# Patient Record
Sex: Male | Born: 1940 | Race: White | Hispanic: No | Marital: Married | Smoking: Current every day smoker
Health system: Southern US, Community
[De-identification: ages and names within clinical notes are randomized; demographics above are authoritative.]

## PROBLEM LIST (undated history)

## (undated) DIAGNOSIS — Z720 Tobacco use: Secondary | ICD-10-CM

## (undated) DIAGNOSIS — I4891 Unspecified atrial fibrillation: Secondary | ICD-10-CM

## (undated) DIAGNOSIS — I10 Essential (primary) hypertension: Secondary | ICD-10-CM

## (undated) DIAGNOSIS — K589 Irritable bowel syndrome without diarrhea: Secondary | ICD-10-CM

## (undated) DIAGNOSIS — J449 Chronic obstructive pulmonary disease, unspecified: Secondary | ICD-10-CM

## (undated) DIAGNOSIS — K8020 Calculus of gallbladder without cholecystitis without obstruction: Secondary | ICD-10-CM

## (undated) DIAGNOSIS — E785 Hyperlipidemia, unspecified: Secondary | ICD-10-CM

## (undated) DIAGNOSIS — G629 Polyneuropathy, unspecified: Secondary | ICD-10-CM

## (undated) DIAGNOSIS — I739 Peripheral vascular disease, unspecified: Secondary | ICD-10-CM

## (undated) HISTORY — DX: Hyperlipidemia, unspecified: E78.5

## (undated) HISTORY — PX: BACK SURGERY: SHX140

## (undated) HISTORY — DX: Essential (primary) hypertension: I10

## (undated) HISTORY — DX: Irritable bowel syndrome, unspecified: K58.9

## (undated) HISTORY — DX: Peripheral vascular disease, unspecified: I73.9

## (undated) HISTORY — DX: Tobacco use: Z72.0

---

## 1999-07-16 ENCOUNTER — Other Ambulatory Visit: Admission: RE | Admit: 1999-07-16 | Discharge: 1999-07-16 | Payer: Self-pay | Admitting: Otolaryngology

## 2000-01-03 ENCOUNTER — Ambulatory Visit (HOSPITAL_BASED_OUTPATIENT_CLINIC_OR_DEPARTMENT_OTHER): Admission: RE | Admit: 2000-01-03 | Discharge: 2000-01-03 | Payer: Self-pay | Admitting: Otolaryngology

## 2001-02-02 ENCOUNTER — Ambulatory Visit (HOSPITAL_COMMUNITY): Admission: RE | Admit: 2001-02-02 | Discharge: 2001-02-02 | Payer: Self-pay | Admitting: Pulmonary Disease

## 2001-09-21 ENCOUNTER — Inpatient Hospital Stay (HOSPITAL_COMMUNITY): Admission: EM | Admit: 2001-09-21 | Discharge: 2001-09-25 | Payer: Self-pay | Admitting: Emergency Medicine

## 2001-09-22 ENCOUNTER — Encounter: Payer: Self-pay | Admitting: Cardiovascular Disease

## 2001-09-28 ENCOUNTER — Ambulatory Visit (HOSPITAL_COMMUNITY): Admission: RE | Admit: 2001-09-28 | Discharge: 2001-09-28 | Payer: Self-pay | Admitting: Cardiovascular Disease

## 2001-09-28 ENCOUNTER — Encounter: Payer: Self-pay | Admitting: Cardiovascular Disease

## 2002-04-09 ENCOUNTER — Encounter: Payer: Self-pay | Admitting: Cardiology

## 2002-04-09 ENCOUNTER — Inpatient Hospital Stay (HOSPITAL_COMMUNITY): Admission: AD | Admit: 2002-04-09 | Discharge: 2002-04-14 | Payer: Self-pay | Admitting: Cardiology

## 2002-05-05 ENCOUNTER — Encounter: Payer: Self-pay | Admitting: Neurology

## 2002-05-05 ENCOUNTER — Ambulatory Visit (HOSPITAL_COMMUNITY): Admission: RE | Admit: 2002-05-05 | Discharge: 2002-05-05 | Payer: Self-pay | Admitting: Neurology

## 2002-05-09 ENCOUNTER — Inpatient Hospital Stay (HOSPITAL_COMMUNITY): Admission: EM | Admit: 2002-05-09 | Discharge: 2002-06-02 | Payer: Self-pay

## 2002-05-10 ENCOUNTER — Encounter: Payer: Self-pay | Admitting: Internal Medicine

## 2002-05-17 ENCOUNTER — Encounter (HOSPITAL_BASED_OUTPATIENT_CLINIC_OR_DEPARTMENT_OTHER): Payer: Self-pay | Admitting: Internal Medicine

## 2002-05-19 ENCOUNTER — Encounter: Payer: Self-pay | Admitting: Cardiovascular Disease

## 2002-05-22 ENCOUNTER — Encounter: Payer: Self-pay | Admitting: Internal Medicine

## 2002-05-26 ENCOUNTER — Encounter (HOSPITAL_BASED_OUTPATIENT_CLINIC_OR_DEPARTMENT_OTHER): Payer: Self-pay | Admitting: Internal Medicine

## 2002-06-02 ENCOUNTER — Inpatient Hospital Stay (HOSPITAL_COMMUNITY)
Admission: RE | Admit: 2002-06-02 | Discharge: 2002-07-08 | Payer: Self-pay | Admitting: Physical Medicine & Rehabilitation

## 2002-06-21 ENCOUNTER — Encounter: Payer: Self-pay | Admitting: Neurology

## 2002-06-30 ENCOUNTER — Encounter: Payer: Self-pay | Admitting: Physical Medicine & Rehabilitation

## 2002-07-06 ENCOUNTER — Encounter: Payer: Self-pay | Admitting: Orthopaedic Surgery

## 2002-12-21 ENCOUNTER — Encounter (HOSPITAL_COMMUNITY): Admission: RE | Admit: 2002-12-21 | Discharge: 2003-01-20 | Payer: Self-pay | Admitting: Neurology

## 2003-01-20 ENCOUNTER — Encounter (HOSPITAL_COMMUNITY): Admission: RE | Admit: 2003-01-20 | Discharge: 2003-02-19 | Payer: Self-pay | Admitting: Neurology

## 2004-04-19 ENCOUNTER — Encounter: Admission: RE | Admit: 2004-04-19 | Discharge: 2004-04-19 | Payer: Self-pay

## 2004-12-19 ENCOUNTER — Ambulatory Visit (HOSPITAL_COMMUNITY): Admission: RE | Admit: 2004-12-19 | Discharge: 2004-12-19 | Payer: Self-pay | Admitting: Pulmonary Disease

## 2005-01-15 ENCOUNTER — Encounter (HOSPITAL_COMMUNITY): Admission: RE | Admit: 2005-01-15 | Discharge: 2005-02-14 | Payer: Self-pay | Admitting: Neurology

## 2005-01-23 ENCOUNTER — Ambulatory Visit (HOSPITAL_COMMUNITY): Admission: RE | Admit: 2005-01-23 | Discharge: 2005-01-23 | Payer: Self-pay | Admitting: Pulmonary Disease

## 2005-02-05 ENCOUNTER — Ambulatory Visit (HOSPITAL_COMMUNITY): Admission: RE | Admit: 2005-02-05 | Discharge: 2005-02-05 | Payer: Self-pay | Admitting: Pulmonary Disease

## 2005-02-15 ENCOUNTER — Encounter (HOSPITAL_COMMUNITY): Admission: RE | Admit: 2005-02-15 | Discharge: 2005-03-17 | Payer: Self-pay | Admitting: Neurology

## 2005-03-04 ENCOUNTER — Ambulatory Visit (HOSPITAL_COMMUNITY): Admission: RE | Admit: 2005-03-04 | Discharge: 2005-03-04 | Payer: Self-pay | Admitting: Pulmonary Disease

## 2005-03-14 ENCOUNTER — Ambulatory Visit (HOSPITAL_COMMUNITY): Admission: RE | Admit: 2005-03-14 | Discharge: 2005-03-14 | Payer: Self-pay | Admitting: Orthopaedic Surgery

## 2005-05-06 ENCOUNTER — Ambulatory Visit (HOSPITAL_COMMUNITY): Admission: RE | Admit: 2005-05-06 | Discharge: 2005-05-06 | Payer: Self-pay | Admitting: Pulmonary Disease

## 2005-11-05 ENCOUNTER — Ambulatory Visit (HOSPITAL_COMMUNITY): Admission: RE | Admit: 2005-11-05 | Discharge: 2005-11-05 | Payer: Self-pay | Admitting: Pulmonary Disease

## 2006-03-17 ENCOUNTER — Encounter (HOSPITAL_BASED_OUTPATIENT_CLINIC_OR_DEPARTMENT_OTHER): Admission: RE | Admit: 2006-03-17 | Discharge: 2006-06-15 | Payer: Self-pay | Admitting: Surgery

## 2008-01-27 ENCOUNTER — Ambulatory Visit (HOSPITAL_COMMUNITY): Admission: RE | Admit: 2008-01-27 | Discharge: 2008-01-27 | Payer: Self-pay | Admitting: Cardiovascular Disease

## 2009-07-12 ENCOUNTER — Inpatient Hospital Stay (HOSPITAL_COMMUNITY): Admission: AD | Admit: 2009-07-12 | Discharge: 2009-07-21 | Payer: Self-pay | Admitting: Cardiovascular Disease

## 2009-07-12 ENCOUNTER — Ambulatory Visit: Payer: Self-pay | Admitting: Critical Care Medicine

## 2009-07-18 ENCOUNTER — Encounter (INDEPENDENT_AMBULATORY_CARE_PROVIDER_SITE_OTHER): Payer: Self-pay | Admitting: Cardiovascular Disease

## 2009-07-18 ENCOUNTER — Ambulatory Visit: Payer: Self-pay | Admitting: Vascular Surgery

## 2009-08-01 ENCOUNTER — Encounter: Payer: Self-pay | Admitting: Pulmonary Disease

## 2009-08-09 DIAGNOSIS — I739 Peripheral vascular disease, unspecified: Secondary | ICD-10-CM | POA: Insufficient documentation

## 2009-08-09 DIAGNOSIS — N259 Disorder resulting from impaired renal tubular function, unspecified: Secondary | ICD-10-CM | POA: Insufficient documentation

## 2009-08-09 DIAGNOSIS — E119 Type 2 diabetes mellitus without complications: Secondary | ICD-10-CM | POA: Insufficient documentation

## 2009-08-09 DIAGNOSIS — I2510 Atherosclerotic heart disease of native coronary artery without angina pectoris: Secondary | ICD-10-CM | POA: Insufficient documentation

## 2009-08-09 DIAGNOSIS — J4489 Other specified chronic obstructive pulmonary disease: Secondary | ICD-10-CM | POA: Insufficient documentation

## 2009-08-09 DIAGNOSIS — J449 Chronic obstructive pulmonary disease, unspecified: Secondary | ICD-10-CM | POA: Insufficient documentation

## 2009-08-09 DIAGNOSIS — I4891 Unspecified atrial fibrillation: Secondary | ICD-10-CM | POA: Insufficient documentation

## 2009-08-11 ENCOUNTER — Ambulatory Visit: Payer: Self-pay | Admitting: Pulmonary Disease

## 2009-08-11 DIAGNOSIS — J984 Other disorders of lung: Secondary | ICD-10-CM

## 2010-03-01 ENCOUNTER — Ambulatory Visit (HOSPITAL_COMMUNITY): Admission: RE | Admit: 2010-03-01 | Discharge: 2010-03-01 | Payer: Self-pay | Admitting: Pulmonary Disease

## 2010-05-12 ENCOUNTER — Other Ambulatory Visit: Payer: Self-pay | Admitting: Emergency Medicine

## 2010-05-12 ENCOUNTER — Other Ambulatory Visit: Payer: Self-pay

## 2010-05-12 ENCOUNTER — Inpatient Hospital Stay (HOSPITAL_COMMUNITY): Admission: EM | Admit: 2010-05-12 | Discharge: 2010-05-14 | Payer: Self-pay | Admitting: Internal Medicine

## 2010-05-13 ENCOUNTER — Ambulatory Visit: Payer: Self-pay | Admitting: Gastroenterology

## 2010-10-07 ENCOUNTER — Encounter: Payer: Self-pay | Admitting: Cardiovascular Disease

## 2010-11-30 LAB — DIFFERENTIAL
Basophils Absolute: 0.1 10*3/uL (ref 0.0–0.1)
Basophils Relative: 1 % (ref 0–1)
Eosinophils Absolute: 0.3 10*3/uL (ref 0.0–0.7)
Eosinophils Absolute: 0.3 10*3/uL (ref 0.0–0.7)
Lymphs Abs: 3.4 10*3/uL (ref 0.7–4.0)
Monocytes Absolute: 1.1 10*3/uL — ABNORMAL HIGH (ref 0.1–1.0)
Monocytes Relative: 9 % (ref 3–12)
Neutrophils Relative %: 63 % (ref 43–77)

## 2010-11-30 LAB — GLUCOSE, CAPILLARY
Glucose-Capillary: 106 mg/dL — ABNORMAL HIGH (ref 70–99)
Glucose-Capillary: 124 mg/dL — ABNORMAL HIGH (ref 70–99)
Glucose-Capillary: 200 mg/dL — ABNORMAL HIGH (ref 70–99)
Glucose-Capillary: 80 mg/dL (ref 70–99)
Glucose-Capillary: 94 mg/dL (ref 70–99)

## 2010-11-30 LAB — TYPE AND SCREEN
ABO/RH(D): A POS
Antibody Screen: NEGATIVE
Antibody Screen: NEGATIVE

## 2010-11-30 LAB — COMPREHENSIVE METABOLIC PANEL
ALT: 21 U/L (ref 0–53)
ALT: 23 U/L (ref 0–53)
CO2: 33 mEq/L — ABNORMAL HIGH (ref 19–32)
CO2: 35 mEq/L — ABNORMAL HIGH (ref 19–32)
Calcium: 9.3 mg/dL (ref 8.4–10.5)
Creatinine, Ser: 1.1 mg/dL (ref 0.4–1.5)
Creatinine, Ser: 1.12 mg/dL (ref 0.4–1.5)
GFR calc non Af Amer: >60 mL/min (ref >60)
GFR calc non Af Amer: >60 mL/min (ref >60)
Glucose, Bld: 90 mg/dL (ref 70–99)
Glucose, Bld: 97 mg/dL (ref 70–99)
Potassium: 4.4 mEq/L (ref 3.5–5.1)
Sodium: 138 mEq/L (ref 135–145)
Total Bilirubin: 0.4 mg/dL (ref 0.3–1.2)
Total Protein: 5.3 g/dL — ABNORMAL LOW (ref 6.0–8.3)

## 2010-11-30 LAB — HEMOGLOBIN AND HEMATOCRIT, BLOOD
HCT: 36 % — ABNORMAL LOW (ref 39.0–52.0)
HCT: 36.1 % — ABNORMAL LOW (ref 39.0–52.0)
Hemoglobin: 11.6 g/dL — ABNORMAL LOW (ref 13.0–17.0)

## 2010-11-30 LAB — CBC
HCT: 33.8 % — ABNORMAL LOW (ref 39.0–52.0)
HCT: 36.2 % — ABNORMAL LOW (ref 39.0–52.0)
HCT: 43.4 % (ref 39.0–52.0)
Hemoglobin: 12 g/dL — ABNORMAL LOW (ref 13.0–17.0)
Hemoglobin: 14.5 g/dL (ref 13.0–17.0)
MCH: 28.7 pg (ref 26.0–34.0)
MCH: 30.1 pg (ref 26.0–34.0)
MCH: 30.2 pg (ref 26.0–34.0)
MCH: 30.3 pg (ref 26.0–34.0)
MCHC: 33.1 g/dL (ref 30.0–36.0)
MCHC: 33.4 g/dL (ref 30.0–36.0)
MCHC: 33.7 g/dL (ref 30.0–36.0)
MCHC: 33.8 g/dL (ref 30.0–36.0)
MCV: 89.2 fL (ref 78.0–100.0)
MCV: 90.2 fL (ref 78.0–100.0)
Platelets: 193 10*3/uL (ref 150–400)
Platelets: 215 10*3/uL (ref 150–400)
RBC: 4.1 MIL/uL — ABNORMAL LOW (ref 4.22–5.81)
RBC: 4.27 MIL/uL (ref 4.22–5.81)
RDW: 14 % (ref 11.5–15.5)
WBC: 10.2 10*3/uL (ref 4.0–10.5)

## 2010-11-30 LAB — BASIC METABOLIC PANEL
BUN: 12 mg/dL (ref 6–23)
CO2: 31 mEq/L (ref 19–32)
CO2: 32 mEq/L (ref 19–32)
Calcium: 8.7 mg/dL (ref 8.4–10.5)
Chloride: 105 mEq/L (ref 96–112)
Creatinine, Ser: 0.88 mg/dL (ref 0.4–1.5)
Glucose, Bld: 91 mg/dL (ref 70–99)
Glucose, Bld: 94 mg/dL (ref 70–99)
Potassium: 3.4 mEq/L — ABNORMAL LOW (ref 3.5–5.1)
Potassium: 3.4 mEq/L — ABNORMAL LOW (ref 3.5–5.1)
Sodium: 140 mEq/L (ref 135–145)

## 2010-11-30 LAB — POCT CARDIAC MARKERS
CKMB, poc: <1 ng/mL — ABNORMAL LOW (ref 1.0–8.0)
Troponin i, poc: <0.05 ng/mL (ref 0.00–0.09)

## 2010-11-30 LAB — ABO/RH: ABO/RH(D): A POS

## 2010-11-30 LAB — PROTIME-INR
INR: 0.95 (ref 0.00–1.49)
Prothrombin Time: 12.9 seconds (ref 11.6–15.2)

## 2010-11-30 LAB — MRSA PCR SCREENING: MRSA by PCR: NEGATIVE

## 2010-12-19 LAB — CBC
HCT: 47.6 % (ref 39.0–52.0)
Hemoglobin: 14.1 g/dL (ref 13.0–17.0)
Hemoglobin: 14.6 g/dL (ref 13.0–17.0)
Hemoglobin: 16 g/dL (ref 13.0–17.0)
MCHC: 33.7 g/dL (ref 30.0–36.0)
MCHC: 34 g/dL (ref 30.0–36.0)
MCV: 92.4 fL (ref 78.0–100.0)
Platelets: 221 10*3/uL (ref 150–400)
RBC: 4.44 MIL/uL (ref 4.22–5.81)
RBC: 4.69 MIL/uL (ref 4.22–5.81)
RBC: 4.72 MIL/uL (ref 4.22–5.81)
RBC: 5.18 MIL/uL (ref 4.22–5.81)
RDW: 13.6 % (ref 11.5–15.5)
RDW: 13.9 % (ref 11.5–15.5)
RDW: 13.9 % (ref 11.5–15.5)
WBC: 8.8 10*3/uL (ref 4.0–10.5)

## 2010-12-19 LAB — GLUCOSE, CAPILLARY
Glucose-Capillary: 103 mg/dL — ABNORMAL HIGH (ref 70–99)
Glucose-Capillary: 114 mg/dL — ABNORMAL HIGH (ref 70–99)
Glucose-Capillary: 120 mg/dL — ABNORMAL HIGH (ref 70–99)
Glucose-Capillary: 149 mg/dL — ABNORMAL HIGH (ref 70–99)
Glucose-Capillary: 152 mg/dL — ABNORMAL HIGH (ref 70–99)
Glucose-Capillary: 187 mg/dL — ABNORMAL HIGH (ref 70–99)
Glucose-Capillary: 88 mg/dL (ref 70–99)

## 2010-12-19 LAB — BASIC METABOLIC PANEL
BUN: 18 mg/dL (ref 6–23)
BUN: 22 mg/dL (ref 6–23)
BUN: 26 mg/dL — ABNORMAL HIGH (ref 6–23)
CO2: 32 mEq/L (ref 19–32)
CO2: 33 mEq/L — ABNORMAL HIGH (ref 19–32)
CO2: 36 mEq/L — ABNORMAL HIGH (ref 19–32)
Calcium: 8.9 mg/dL (ref 8.4–10.5)
Calcium: 9.2 mg/dL (ref 8.4–10.5)
Calcium: 9.2 mg/dL (ref 8.4–10.5)
Creatinine, Ser: 1.16 mg/dL (ref 0.4–1.5)
Creatinine, Ser: 1.65 mg/dL — ABNORMAL HIGH (ref 0.4–1.5)
GFR calc Af Amer: 44 mL/min — ABNORMAL LOW (ref >60)
GFR calc Af Amer: 50 mL/min — ABNORMAL LOW (ref >60)
GFR calc Af Amer: >60 mL/min (ref >60)
GFR calc non Af Amer: 36 mL/min — ABNORMAL LOW (ref >60)
GFR calc non Af Amer: 45 mL/min — ABNORMAL LOW (ref >60)
Glucose, Bld: 106 mg/dL — ABNORMAL HIGH (ref 70–99)
Glucose, Bld: 111 mg/dL — ABNORMAL HIGH (ref 70–99)
Glucose, Bld: 129 mg/dL — ABNORMAL HIGH (ref 70–99)
Glucose, Bld: 134 mg/dL — ABNORMAL HIGH (ref 70–99)
Sodium: 135 mEq/L (ref 135–145)
Sodium: 137 mEq/L (ref 135–145)

## 2010-12-19 LAB — PROTIME-INR
INR: 1.15 (ref 0.00–1.49)
INR: 1.54 — ABNORMAL HIGH (ref 0.00–1.49)
Prothrombin Time: 18.4 seconds — ABNORMAL HIGH (ref 11.6–15.2)
Prothrombin Time: 28.3 seconds — ABNORMAL HIGH (ref 11.6–15.2)

## 2010-12-19 LAB — EXPECTORATED SPUTUM ASSESSMENT W GRAM STAIN, RFLX TO RESP C

## 2010-12-19 LAB — CARDIAC PANEL(CRET KIN+CKTOT+MB+TROPI)
CK, MB: 1.6 ng/mL (ref 0.3–4.0)
CK, MB: 7 ng/mL — ABNORMAL HIGH (ref 0.3–4.0)
Relative Index: 0.8 (ref 0.0–2.5)
Relative Index: 0.9 (ref 0.0–2.5)
Troponin I: 0.03 ng/mL (ref 0.00–0.06)

## 2010-12-19 LAB — COMPREHENSIVE METABOLIC PANEL
CO2: 31 mEq/L (ref 19–32)
Calcium: 9.2 mg/dL (ref 8.4–10.5)
Creatinine, Ser: 1.74 mg/dL — ABNORMAL HIGH (ref 0.4–1.5)
GFR calc Af Amer: 47 mL/min — ABNORMAL LOW (ref >60)
GFR calc non Af Amer: 39 mL/min — ABNORMAL LOW (ref >60)
Glucose, Bld: 105 mg/dL — ABNORMAL HIGH (ref 70–99)

## 2010-12-19 LAB — HEPARIN LEVEL (UNFRACTIONATED)
Heparin Unfractionated: 0.25 IU/mL — ABNORMAL LOW (ref 0.30–0.70)
Heparin Unfractionated: 0.4 IU/mL (ref 0.30–0.70)
Heparin Unfractionated: 0.99 IU/mL — ABNORMAL HIGH (ref 0.30–0.70)
Heparin Unfractionated: 1.11 IU/mL — ABNORMAL HIGH (ref 0.30–0.70)

## 2010-12-19 LAB — POTASSIUM: Potassium: 4.7 mEq/L (ref 3.5–5.1)

## 2010-12-19 LAB — BRAIN NATRIURETIC PEPTIDE: Pro B Natriuretic peptide (BNP): <30 pg/mL (ref 0.0–100.0)

## 2010-12-19 LAB — CULTURE, RESPIRATORY W GRAM STAIN

## 2010-12-20 LAB — PROTIME-INR
INR: 0.99 (ref 0.00–1.49)
Prothrombin Time: 13 seconds (ref 11.6–15.2)

## 2010-12-20 LAB — GLUCOSE, CAPILLARY
Glucose-Capillary: 100 mg/dL — ABNORMAL HIGH (ref 70–99)
Glucose-Capillary: 101 mg/dL — ABNORMAL HIGH (ref 70–99)
Glucose-Capillary: 108 mg/dL — ABNORMAL HIGH (ref 70–99)
Glucose-Capillary: 109 mg/dL — ABNORMAL HIGH (ref 70–99)
Glucose-Capillary: 110 mg/dL — ABNORMAL HIGH (ref 70–99)
Glucose-Capillary: 110 mg/dL — ABNORMAL HIGH (ref 70–99)
Glucose-Capillary: 114 mg/dL — ABNORMAL HIGH (ref 70–99)
Glucose-Capillary: 123 mg/dL — ABNORMAL HIGH (ref 70–99)
Glucose-Capillary: 136 mg/dL — ABNORMAL HIGH (ref 70–99)
Glucose-Capillary: 146 mg/dL — ABNORMAL HIGH (ref 70–99)
Glucose-Capillary: 86 mg/dL (ref 70–99)
Glucose-Capillary: 94 mg/dL (ref 70–99)

## 2010-12-20 LAB — BASIC METABOLIC PANEL
BUN: 11 mg/dL (ref 6–23)
BUN: 13 mg/dL (ref 6–23)
BUN: 17 mg/dL (ref 6–23)
CO2: 35 mEq/L — ABNORMAL HIGH (ref 19–32)
Calcium: 8.6 mg/dL (ref 8.4–10.5)
Calcium: 8.7 mg/dL (ref 8.4–10.5)
Calcium: 9.7 mg/dL (ref 8.4–10.5)
Chloride: 92 mEq/L — ABNORMAL LOW (ref 96–112)
Chloride: 93 mEq/L — ABNORMAL LOW (ref 96–112)
Chloride: 98 mEq/L (ref 96–112)
Creatinine, Ser: 1.1 mg/dL (ref 0.4–1.5)
Creatinine, Ser: 1.2 mg/dL (ref 0.4–1.5)
Creatinine, Ser: 1.23 mg/dL (ref 0.4–1.5)
GFR calc Af Amer: >60 mL/min (ref >60)
GFR calc Af Amer: >60 mL/min (ref >60)
GFR calc Af Amer: >60 mL/min (ref >60)
GFR calc non Af Amer: 60 mL/min — ABNORMAL LOW (ref >60)
GFR calc non Af Amer: >60 mL/min (ref >60)
GFR calc non Af Amer: >60 mL/min (ref >60)
Glucose, Bld: 102 mg/dL — ABNORMAL HIGH (ref 70–99)
Glucose, Bld: 109 mg/dL — ABNORMAL HIGH (ref 70–99)
Glucose, Bld: 110 mg/dL — ABNORMAL HIGH (ref 70–99)
Potassium: 3.3 mEq/L — ABNORMAL LOW (ref 3.5–5.1)
Potassium: 3.4 mEq/L — ABNORMAL LOW (ref 3.5–5.1)
Sodium: 134 mEq/L — ABNORMAL LOW (ref 135–145)
Sodium: 139 mEq/L (ref 135–145)
Sodium: 140 mEq/L (ref 135–145)

## 2010-12-20 LAB — LIPID PANEL
Triglycerides: 159 mg/dL — ABNORMAL HIGH (ref <150)
VLDL: 32 mg/dL (ref 0–40)

## 2010-12-20 LAB — CBC
HCT: 45.3 % (ref 39.0–52.0)
Hemoglobin: 15.3 g/dL (ref 13.0–17.0)
Hemoglobin: 17.1 g/dL — ABNORMAL HIGH (ref 13.0–17.0)
MCHC: 33.6 g/dL (ref 30.0–36.0)
MCHC: 34.6 g/dL (ref 30.0–36.0)
MCV: 91.6 fL (ref 78.0–100.0)
Platelets: 182 10*3/uL (ref 150–400)
Platelets: 185 10*3/uL (ref 150–400)
Platelets: 203 10*3/uL (ref 150–400)
RBC: 4.76 MIL/uL (ref 4.22–5.81)
RBC: 5.52 MIL/uL (ref 4.22–5.81)
RDW: 13.8 % (ref 11.5–15.5)
RDW: 14.6 % (ref 11.5–15.5)
WBC: 10.6 10*3/uL — ABNORMAL HIGH (ref 4.0–10.5)
WBC: 9.9 10*3/uL (ref 4.0–10.5)

## 2010-12-20 LAB — DIFFERENTIAL
Lymphs Abs: 1.6 10*3/uL (ref 0.7–4.0)
Monocytes Relative: 9 % (ref 3–12)
Neutro Abs: 7.2 10*3/uL (ref 1.7–7.7)
Neutrophils Relative %: 73 % (ref 43–77)

## 2010-12-20 LAB — HEPARIN LEVEL (UNFRACTIONATED): Heparin Unfractionated: 0.26 IU/mL — ABNORMAL LOW (ref 0.30–0.70)

## 2010-12-20 LAB — TSH: TSH: 1.205 u[IU]/mL (ref 0.350–4.500)

## 2010-12-20 LAB — HEMOGLOBIN A1C
Hgb A1c MFr Bld: 6.4 % — ABNORMAL HIGH (ref 4.6–6.1)
Mean Plasma Glucose: 137 mg/dL

## 2010-12-20 LAB — BRAIN NATRIURETIC PEPTIDE
Pro B Natriuretic peptide (BNP): 31 pg/mL (ref 0.0–100.0)
Pro B Natriuretic peptide (BNP): 33 pg/mL (ref 0.0–100.0)

## 2011-01-29 NOTE — Procedures (Signed)
[name], [name] NO.:  [id number]   MEDICAL RECORD NO.:  [id number]          PATIENT TYPE:  INP   LOCATION:  2508                         FACILITY:  [facility]   PHYSICIAN:  Nanetta Batty, M.D.   DATE OF BIRTH:  Oct 30, 1940   DATE OF PROCEDURE:  DATE OF DISCHARGE:                    PERIPHERAL VASCULAR INVASIVE PROCEDURE   Mr. Chad Morse is a 70 year old gentleman, patient of Dr. Alanda Amass, who  underwent peripheral angiography today because of a nonhealing ulcer.  He was found to have tandem high grade, proximal left SFA stenoses and  presents now for intervention.   The patient was previously prepped and draped with a Crossover sheath in  the right femoral artery over the iliac bifurcation into the left common  femoral artery.  He had already received 3000 units of heparin  intravenously.  A 0.035 Wholey wire was then advanced across the  lesions.  A PTA was performed with 4 x 2 PowerFlex at 2 discrete sites.  The entire segment was stented with a 6 x 80 DV3 Protege nitinol self-  expanding stent and postdilated with a 5 x 60 PowerFlex at nominal  pressures, resulting in a reduction of 80% stenoses to 0% residual.  The  patient tolerated the procedure well.  The sheath was withdrawn across  the bifurcation and exchanged for a short 6-French sheath.  The patient  left the lab in stable condition.  The remainder of the patient's care  will be per Dr. Alanda Amass.      Nanetta Batty, M.D.  Electronically Signed     JB/MEDQ  D:  07/13/2009  T:  07/14/2009  Job:  [id number]   cc:   [facility]  Richard A. Alanda Amass, M.D.  Jonnie Kind, M.D.  [facility]

## 2011-02-01 NOTE — Assessment & Plan Note (Signed)
[facility]   NAME:  Chad Morse, Chad Morse           ACCOUNT NO.:  [id number]   MEDICAL RECORD NO.:  [id number]      DATE OF BIRTH:  1941/09/01   PHYSICIAN:  Theresia Majors. Tanda Rockers, M.D. VISIT DATE:  03/17/2006                                     OFFICE VISIT   REASON FOR CONSULTATION:  Mr. Baillie is a 70 year old man who is referred  by  Dr. Susa Griffins for the evaluation of a non-healing ulceration on the  right lateral malleolus of 8 months' duration.   IMPRESSION:  Combined occlusive peripheral vascular disease and severe  stasis cellulitis with ulceration.   RECOMMENDATIONS:  Multilayered compression bilaterally to control the edema.  We will be cautious in the application of external compression in the face  of concurrent occlusive vascular disease.   SUBJECTIVE:  Mr. Detter is a 70 year old man who has been under the care  of  Dr. Shaune Pollack and Dr. Susa Griffins for multiple medical complaints  including peripheral vascular disease and congestive failure.  He has had  this lesion over the right lateral malleolus for 8 months according to the  patient and his wife.  It has not been infected.  He has had the consistent  presence of fluid.  There has been a tendency toward increased pigmentation  of the lower extremities.   PAST MEDICAL HISTORY:  Significant for his cardiac dysfunction.  He has had  ulcerations on both lower extremities which have healed with conservative  treatment in the past.  His past surgery has included a resection of the  syringomyelocele in 2002.  He does not have diabetes.  He has a history of  tobacco smoking.  He has cardiac catheterization as well as Cardiolite that  are consistent with anterior wall ischemia.   CURRENT MEDICATIONS:  1.  Metoprolol 25 mg 1/2 tablet a day.  2.  Diltiazem as hydrochloride 180 mg once a day.  3.  Furosemide 40 mg a day.  4.  __________  tablet 1 daily.  5.  Baclofen 20 mg, 4 times  a day.  6.  Tizanidine as hydrochloride 4 mg 4 times a day.  7.  Potassium 20 mEq a day.  8.  Zocor 20 mg a day.  9.  Aspirin 81 mg a day.  10. Trazodone 50 mg twice a day.  11. Cephalexin 500 mg t.i.d.   FAMILY HISTORY:  Positive for diabetes, hypertension and heart attack.   SOCIAL HISTORY:  He is married.  He lives with his wife in [place].   REVIEW OF SYSTEMS:  Discloses that the patient is markedly incapacitated by  uncontrollable spasms which are related to his spinal dysfunction and  previous surgery.  He has recently had a spasm-related hip fracture  requiring prolonged bed rest.  He has diuretic-induced polyuria, but his  renal function has been described as normal.  He denies syncope, chest pain,  bowel or bladder dysfunction.  The remainder of the review of systems is  negative.   PHYSICAL EXAMINATION:  GENERAL:  He is an alert, oriented man in good  contact with reality, pleasant, and responding appropriately.  HEENT:  Clear.  NECK:  Supple.  Trachea is midline.  Thyroid is non-palpable.  HEART:  Sounds are distant.  ABDOMEN:  Soft.  EXTREMITIES:  Femoral pulses are indeterminate.  The lower extremity  is  abnormal.  There are bilateral changes of 3+ edema with hyperpigmentation.  There is a chronic __________  ulcer on the lateral malleolus of the right  lower extremity with scant drainage.  There is presence of hair over the mid-  portion and dorsum of the right foot as well at the dorsal interphalageal  joints and also hair on the digits.  Similar findings are present in the  left lower extremity, and there is a persistence of hyperpigmentation.  NEUROLOGICAL:  The patient retains protective sensation.  The pedal pulses  are indeterminate.  A __________  Doppler exam, however, shows a harsh,  monophasic signal at both the dorsalis pedis and the posterior tibia and the  right lower extremity.  In the left lower extremity, there are biphasic  signals in the  dorsalis pedis and posterior tibia.   DISCUSSION:  Mr. Rossetti undoubtedly has an ischemic component to this  lateral malleolar ulceration on the right lower extremity.  We will exercise  care in applying external compression.  We will place him in a multi-wrap  compressive wrap, and reevaluate him weekly.  We will place these wraps  bilaterally due to the symmetrical aspect of his edema.  The patient has  been counseled regarding symptoms related to the wrap being too tight.  Both  he and his wife were given an opportunity to ask questions.  They seemed to  understand the objective of the therapy, i.e. to be decrease the peripheral  edema in the local area of the ulcer and to permit enough perfusion for  healing.  They have been given an opportunity to ask questions, and indicate  satisfaction with this approach.   VITAL SIGNS:  His vital signs are stable.  He is afebrile.   PURPOSE OF TODAY'S VISIT:   WOUND EXAM:   WOUND SINCE LAST VISIT:   CHANGE IN INTERVAL MEDICAL HISTORY:   DIAGNOSIS:   TREATMENT:   ANESTHETIC USED:   TISSUE DEBRIDED:   LEVEL:   CHANGE IN MEDS:   COMPRESSION BANDAGE:   OTHER:   MANAGEMENT PLAN & GOAL:           ______________________________  Theresia Majors. Tanda Rockers, M.D.     Cephus Slater  D:  03/17/2006  T:  03/17/2006  Job:  [id number]   cc:   Gerlene Burdock A. Alanda Amass, M.D.  Fax: [phone]   Oneal Deputy. Juanetta Gosling, M.D.  Fax: [phone]

## 2011-02-01 NOTE — Assessment & Plan Note (Signed)
[facility]   NAME:  Chad Morse, Chad Morse           ACCOUNT NO.:  [id number]   MEDICAL RECORD NO.:  [id number]      DATE OF BIRTH:  October 12, 1940   PHYSICIAN:  Theresia Majors. Tanda Rockers, M.D. VISIT DATE:  03/26/2006                                     OFFICE VISIT   VITAL SIGNS:  Blood pressure is 115/55, respirations 22, pulse rate 58, and  Chad Morse is afebrile.   SUBJECTIVE:  Chad Morse is a 70 year old man who has been followed for a  combined arterial insufficiency and stasis ulceration involving the right  lateral malleolus and severe edema in his left lower extremity.  During the  interim, Chad Morse has worn bilateral Unna boots.  Chad Morse denies pain, fever, or  drainage.   PURPOSE OF TODAY'S VISIT:   WOUND EXAM:  Inspection of the leg shows that the edema is very well  controlled with the compressive wraps.  There is wrinkling suggestive of  adequate compression.  The ulceration that was followed on the right lateral  malleolus is resolved.  There is a well adherent mature eschar measuring  approximately 0.3-cm in diameter.  We left this eschar intact.  The  capillary refill is normal, although the pedal pulses are not appreciated.   WOUND SINCE LAST VISIT:   CHANGE IN INTERVAL MEDICAL HISTORY:   DIAGNOSIS:  Resolved stasis ulcers with edema controlled with compression  wraps.   TREATMENT:   ANESTHETIC USED:   TISSUE DEBRIDED:   LEVEL:   CHANGE IN MEDS:   COMPRESSION BANDAGE:   OTHER:   MANAGEMENT PLAN & GOAL:  We have issued the patient a prescription for  bilateral below the knee 30-to-40-mm compression hose.  We are referring him  to [facility] __________  to be fitted for those stockings this afternoon and  to begin wearing them as indicated.  We have counseled the patient regarding  the continued need to keep his appointments with his medical doctor.  Chad Morse  should continue to take his diuretic.  His stockings will need to  be replaced on a 3-4 month interval.   If Chad Morse develops severe swelling and  recurrent ulceration, Chad Morse should contact his primary care physician and be  referred back to the [facility] for followup.  The patient understands the  aforementioned, expresses gratitude for having been seen in the clinic.           ______________________________  Theresia Majors. Tanda Rockers, M.D.     Cephus Slater  D:  03/26/2006  T:  03/26/2006  Job:  [id number]   cc:   Gerlene Burdock A. Alanda Amass, M.D.  Fax: [phone]

## 2011-02-01 NOTE — Consult Note (Signed)
NAME:  STERLING, MONDO NO.:  [id number]   MEDICAL RECORD NO.:  [id number]          PATIENT TYPE:  REC   LOCATION:  FOOT                         FACILITY:  [facility]   PHYSICIAN:  Theresia Majors. Tanda Rockers, M.D.DATE OF BIRTH:  [birth year]   DATE OF CONSULTATION:  03/17/2006  DATE OF DISCHARGE:                                   CONSULTATION   NOTE:  This is a re-dictation.   REASON FOR CONSULTATION:  Chad Morse is a 70 year old man referred by Dr.  Susa Griffins for evaluation of nonhealing ulcerations in the right  lateral malleolus.  They have been present for 8 months.   IMPRESSION:  Combined occlusive vascular disease and severe stasis  cellulitis with extreme fluid retention.   RECOMMENDATIONS:  A multilayer compression wrap bilaterally to control  edema.  We will be cautious in the application of external compression in  the face of concurrent occlusive vascular disease.   SUBJECTIVE:  Chad Morse is a 70 year old man who is been under the care  of Dr. Shaune Pollack and Dr. Susa Griffins for multiple medical complaints  including peripheral vascular disease and congestive failure.  He has had  this lesion over the right lateral malleolus for 8 months according to the  patient and his wife.  It has not been infected.  He has had the consistent  presence of fluid retention.  There has been a tendency toward increased  pigmentation in the lower extremities with moderate to mild pain.   PAST MEDICAL HISTORY:  1.  Cardiac dysfunction.  2.  He has had ulcerations of both lower extremities in the past which have      healed with conservative treatment.  (He does not have diabetes.)   PAST SURGICAL HISTORY:  1.  A resection of a syringomyelocele in 2002.  2.  He has had a cardiac catheterization, as well as a Cardiolite, that are      consistent with anterior wall ischemia of nonsurgical intensity.   ALLERGIES:  He he denies allergies.   CURRENT MEDICATION  LIST:  1.  Metoprolol 25 mg 1/2 tablet a day.  2.  Diltiazem hydrochloride 180 mg daily.  3.  Furosemide 40 mg daily.  4.  Multiple vitamin daily.  5.  Baclofen 20 mg four times a day.  6.  Tizanidine hydrochloride 4 mg four times a day.  7.  Potassium 20 mEq a day.  8.  Zocor 20 mg a day.  9.  Aspirin 81 mg a day.  10. Trazodone 50 mg b.i.d.  11. Sulfasalazine 500 mg t.i.d.   FAMILY HISTORY:  Positive for diabetes, hypertension and heart attack.   SOCIAL HISTORY:  He is married.  He lives with his wife in [place].  He  has a history of tobacco smoking.   REVIEW OF SYSTEMS:  The patient is markedly incapacitated by uncontrolled  spasms which are related to his spinal dysfunction and previous surgery.  He  has recently had spasm-related hip fractures requiring prolonged bedrest.  He has diuretic-induced polyuria, but his renal function has been described  as normal.  He denies syncope, chest  pain, or bowel or bowel dysfunction.  The remainder of the review of systems is negative.   PHYSICAL EXAMINATION:  GENERAL:  He is an alert, oriented man in good  contact with reality.  He is pleasant and appropriate.  VITAL SIGNS:  Stable.  He is afebrile.  HEENT:  Clear.  NECK:  Supple.  Trachea is midline.  Thyroid is nonpalpable.  HEART:  The heart sounds are distant.  ABDOMEN:  Soft.  EXTREMITIES:  The femoral pulses are indeterminate.  Lower extremity is  abnormal.  There are bilateral changes of 3+ edema with hyperpigmentation.  There is a chronic stasis ulcer on the lateral malleolus of the right lower  extremity with associated scant drainage.  There is a presence of hair over  the mid portion of the dorsum of the right foot, as well as on the dorsal  interphalangeal joints and also hair on the digits.  Similar findings are  present on the left lower extremity, and there is a persistence of  hyperpigmentation.  NEUROLOGIC:  The patient retains protective sensation.  The pedal  pulses are  indeterminate.  A pencil Doppler exam, however, shows harsh monophasic  signals at both the dorsalis pedis and the posterior tibia of the right  lower extremity.  In the left lower extremity, there are biphasic signals in  the dorsalis pedis and posterior tibia.   DISCUSSION:  Chad Morse undoubtedly has an ischemic component to this  lateral malleolar ulceration of the right lower extremity.  We will exercise  care in applying external compression.  We will place him in a multiple wrap  compressive dressing and reevaluate him weekly.  We will place these wraps  bilaterally due to the symmetrical aspect of his fluid retention.  The  patient has been counseled regarding his symptoms related to the wrap being  too tight.  Both he and his wife were given the opportunity to ask  questions.  They seem to understand the objective of the therapy, i.e. to  decrease the peripheral edema in the local area of the ulcer and to permit  enough perfusion for healing.  They have been given an opportunity to ask  questions.  They indicate satisfaction with this approach.           ______________________________  Theresia Majors. Tanda Rockers, M.D.     Chad Morse  D:  03/18/2006  T:  03/18/2006  Job:  [id number]   cc:   Gerlene Burdock A. Alanda Amass, M.D.  Fax: [phone]   Oneal Deputy. Juanetta Gosling, M.D.  Fax: [phone]

## 2011-12-05 ENCOUNTER — Other Ambulatory Visit (HOSPITAL_COMMUNITY): Payer: Self-pay | Admitting: Pulmonary Disease

## 2011-12-09 ENCOUNTER — Ambulatory Visit (HOSPITAL_COMMUNITY)
Admission: RE | Admit: 2011-12-09 | Discharge: 2011-12-09 | Disposition: A | Discharge status: Home / Self Care | Payer: Medicare Other | Source: Ambulatory Visit | Attending: Pulmonary Disease | Admitting: Pulmonary Disease

## 2011-12-09 DIAGNOSIS — Q619 Cystic kidney disease, unspecified: Secondary | ICD-10-CM | POA: Insufficient documentation

## 2011-12-09 DIAGNOSIS — K7689 Other specified diseases of liver: Secondary | ICD-10-CM | POA: Insufficient documentation

## 2011-12-09 DIAGNOSIS — R109 Unspecified abdominal pain: Secondary | ICD-10-CM | POA: Insufficient documentation

## 2011-12-09 DIAGNOSIS — K8020 Calculus of gallbladder without cholecystitis without obstruction: Secondary | ICD-10-CM | POA: Insufficient documentation

## 2012-01-24 ENCOUNTER — Encounter (HOSPITAL_COMMUNITY): Payer: Self-pay | Admitting: Emergency Medicine

## 2012-01-24 ENCOUNTER — Observation Stay (HOSPITAL_COMMUNITY)
Admission: EM | Admit: 2012-01-24 | Discharge: 2012-01-28 | Disposition: A | Discharge status: Home / Self Care | Payer: Medicare Other | Attending: Cardiology | Admitting: Cardiology

## 2012-01-24 DIAGNOSIS — J4489 Other specified chronic obstructive pulmonary disease: Secondary | ICD-10-CM | POA: Diagnosis present

## 2012-01-24 DIAGNOSIS — R079 Chest pain, unspecified: Principal | ICD-10-CM | POA: Insufficient documentation

## 2012-01-24 DIAGNOSIS — E1142 Type 2 diabetes mellitus with diabetic polyneuropathy: Secondary | ICD-10-CM | POA: Insufficient documentation

## 2012-01-24 DIAGNOSIS — F17200 Nicotine dependence, unspecified, uncomplicated: Secondary | ICD-10-CM | POA: Insufficient documentation

## 2012-01-24 DIAGNOSIS — J984 Other disorders of lung: Secondary | ICD-10-CM

## 2012-01-24 DIAGNOSIS — E1149 Type 2 diabetes mellitus with other diabetic neurological complication: Secondary | ICD-10-CM | POA: Insufficient documentation

## 2012-01-24 DIAGNOSIS — N259 Disorder resulting from impaired renal tubular function, unspecified: Secondary | ICD-10-CM | POA: Diagnosis present

## 2012-01-24 DIAGNOSIS — J449 Chronic obstructive pulmonary disease, unspecified: Secondary | ICD-10-CM

## 2012-01-24 DIAGNOSIS — G629 Polyneuropathy, unspecified: Secondary | ICD-10-CM | POA: Diagnosis present

## 2012-01-24 DIAGNOSIS — J440 Chronic obstructive pulmonary disease with acute lower respiratory infection: Secondary | ICD-10-CM | POA: Insufficient documentation

## 2012-01-24 DIAGNOSIS — I739 Peripheral vascular disease, unspecified: Secondary | ICD-10-CM | POA: Diagnosis present

## 2012-01-24 DIAGNOSIS — R911 Solitary pulmonary nodule: Secondary | ICD-10-CM | POA: Insufficient documentation

## 2012-01-24 DIAGNOSIS — I4821 Permanent atrial fibrillation: Secondary | ICD-10-CM | POA: Insufficient documentation

## 2012-01-24 DIAGNOSIS — I2510 Atherosclerotic heart disease of native coronary artery without angina pectoris: Secondary | ICD-10-CM | POA: Insufficient documentation

## 2012-01-24 DIAGNOSIS — K8020 Calculus of gallbladder without cholecystitis without obstruction: Secondary | ICD-10-CM

## 2012-01-24 DIAGNOSIS — J209 Acute bronchitis, unspecified: Secondary | ICD-10-CM | POA: Insufficient documentation

## 2012-01-24 DIAGNOSIS — I4891 Unspecified atrial fibrillation: Secondary | ICD-10-CM | POA: Insufficient documentation

## 2012-01-24 DIAGNOSIS — E119 Type 2 diabetes mellitus without complications: Secondary | ICD-10-CM

## 2012-01-24 HISTORY — DX: Calculus of gallbladder without cholecystitis without obstruction: K80.20

## 2012-01-24 HISTORY — DX: Unspecified atrial fibrillation: I48.91

## 2012-01-24 HISTORY — DX: Peripheral vascular disease, unspecified: I73.9

## 2012-01-24 HISTORY — DX: Chronic obstructive pulmonary disease, unspecified: J44.9

## 2012-01-24 HISTORY — DX: Polyneuropathy, unspecified: G62.9

## 2012-01-24 LAB — CBC
MCH: 30.3 pg (ref 26.0–34.0)
Platelets: 263 10*3/uL (ref 150–400)
RBC: 5.21 MIL/uL (ref 4.22–5.81)

## 2012-01-24 LAB — PROTIME-INR
INR: 0.95 (ref 0.00–1.49)
Prothrombin Time: 12.9 seconds (ref 11.6–15.2)

## 2012-01-24 NOTE — ED Notes (Signed)
Per EMS, patient complaining of chest pain that started around 2000 this evening (though, patient did have an episode of chest pain last night).  Pain mid-sternal; IV started on left forearm (20 GA).  CBG 135; patient took 1 nitro before EMS arrival.  Patient given 324 ASA by EMS; 12-lead unremarkable.

## 2012-01-24 NOTE — ED Notes (Signed)
Patient states that he had an episode of chest pain last night for about 30 minutes where he became short of breath, clammy, and diaphoretic.  Patient states that his legs started to hurt, so he took 10 mg hydrocodone to ease the pain.  Patient states that he did not call EMS for the chest pain because his wife just got out of the hospital and he did not want to leave here.  Patient states tonight around 2000, episodes of chest pain started and each lasted around 10-15 minutes.  Patient took 1 nitroglycerin before EMS arrival and was given 324 ASA by EMS.  Patient rated chest pain when it started 7/10; describes the pain as "sharp" and "jabbing"; location of pain left-sided.  Denies radiation of pain.  IV started in left forearm (20 GA) by EMS.  Patient states that he is currently pain free (became pain free in route to hospital).  Upon arrival to room, patient changed into gown and connected to continuous cardiac, pulse ox, and blood pressure monitor.  Will continue to monitor.

## 2012-01-24 NOTE — ED Notes (Signed)
Patient currently sitting up in bed; no respiratory or acute distress noted.  Patient updated on plan of care; informed patient that lab will be by to collect lab work.  Family present at bedside.  Patient has no other questions or concerns at this time; will continue to monitor.

## 2012-01-24 NOTE — ED Notes (Signed)
Lab at bedside

## 2012-01-24 NOTE — ED Notes (Signed)
ED EKG given to and signed by Dr. Preston Fleeting.

## 2012-01-25 ENCOUNTER — Encounter (HOSPITAL_COMMUNITY): Payer: Self-pay | Admitting: Internal Medicine

## 2012-01-25 ENCOUNTER — Emergency Department (HOSPITAL_COMMUNITY): Payer: Medicare Other

## 2012-01-25 DIAGNOSIS — E1165 Type 2 diabetes mellitus with hyperglycemia: Secondary | ICD-10-CM

## 2012-01-25 DIAGNOSIS — I4891 Unspecified atrial fibrillation: Secondary | ICD-10-CM

## 2012-01-25 DIAGNOSIS — J449 Chronic obstructive pulmonary disease, unspecified: Secondary | ICD-10-CM | POA: Insufficient documentation

## 2012-01-25 DIAGNOSIS — R079 Chest pain, unspecified: Secondary | ICD-10-CM

## 2012-01-25 DIAGNOSIS — I739 Peripheral vascular disease, unspecified: Secondary | ICD-10-CM | POA: Insufficient documentation

## 2012-01-25 DIAGNOSIS — IMO0001 Reserved for inherently not codable concepts without codable children: Secondary | ICD-10-CM

## 2012-01-25 DIAGNOSIS — I10 Essential (primary) hypertension: Secondary | ICD-10-CM

## 2012-01-25 LAB — CBC
MCV: 86 fL (ref 78.0–100.0)
Platelets: 235 10*3/uL (ref 150–400)
RBC: 4.87 MIL/uL (ref 4.22–5.81)
RDW: 12.8 % (ref 11.5–15.5)
WBC: 12.3 10*3/uL — ABNORMAL HIGH (ref 4.0–10.5)

## 2012-01-25 LAB — BASIC METABOLIC PANEL
Calcium: 9.3 mg/dL (ref 8.4–10.5)
Calcium: 9 mg/dL (ref 8.4–10.5)
Creatinine, Ser: 1.12 mg/dL (ref 0.50–1.35)
GFR calc Af Amer: 62 mL/min — ABNORMAL LOW (ref >90)
GFR calc Af Amer: 74 mL/min — ABNORMAL LOW (ref >90)
GFR calc non Af Amer: 53 mL/min — ABNORMAL LOW (ref >90)
GFR calc non Af Amer: 64 mL/min — ABNORMAL LOW (ref >90)
Glucose, Bld: 124 mg/dL — ABNORMAL HIGH (ref 70–99)
Potassium: 3.7 mEq/L (ref 3.5–5.1)
Sodium: 138 mEq/L (ref 135–145)
Sodium: 138 mEq/L (ref 135–145)

## 2012-01-25 LAB — CARDIAC PANEL(CRET KIN+CKTOT+MB+TROPI)
CK, MB: 1.7 ng/mL (ref 0.3–4.0)
CK, MB: 2.1 ng/mL (ref 0.3–4.0)
Relative Index: 0.7 (ref 0.0–2.5)
Relative Index: 0.5 (ref 0.0–2.5)
Total CK: 230 U/L (ref 7–232)
Total CK: 451 U/L — ABNORMAL HIGH (ref 7–232)
Troponin I: <0.3 ng/mL (ref <0.30)
Troponin I: <0.3 ng/mL (ref <0.30)
Troponin I: <0.3 ng/mL (ref <0.30)

## 2012-01-25 MED ORDER — ZOLPIDEM TARTRATE 5 MG PO TABS: 5.0000 mg | ORAL_TABLET | Freq: Every evening | ORAL | Status: DC | PRN

## 2012-01-25 MED ORDER — SODIUM CHLORIDE 0.9 % IV SOLN: 250.0000 mL | INTRAVENOUS | Status: DC | PRN

## 2012-01-25 MED ORDER — ASPIRIN EC 325 MG PO TBEC
325.0000 mg | DELAYED_RELEASE_TABLET | Freq: Every day | ORAL | Status: DC
  Administered 2012-01-25 – 2012-01-26 (×2): 325 mg via ORAL
  Filled 2012-01-25 (×3): qty 1

## 2012-01-25 MED ORDER — FLUTICASONE-SALMETEROL 250-50 MCG/DOSE IN AEPB
1.0000 | INHALATION_SPRAY | Freq: Two times a day (BID) | RESPIRATORY_TRACT | Status: DC
  Administered 2012-01-25 – 2012-01-28 (×7): 1 via RESPIRATORY_TRACT
  Filled 2012-01-25: qty 14

## 2012-01-25 MED ORDER — ACETAMINOPHEN 325 MG PO TABS
650.0000 mg | ORAL_TABLET | Freq: Four times a day (QID) | ORAL | Status: DC | PRN
  Administered 2012-01-25: 650 mg via ORAL
  Filled 2012-01-25: qty 2

## 2012-01-25 MED ORDER — L-METHYLFOLATE-B6-B12 3-35-2 MG PO TABS
1.0000 | ORAL_TABLET | Freq: Every day | ORAL | Status: DC
  Administered 2012-01-25 – 2012-01-28 (×4): 1 via ORAL
  Filled 2012-01-25 (×4): qty 1

## 2012-01-25 MED ORDER — ALLOPURINOL 100 MG PO TABS
100.0000 mg | ORAL_TABLET | Freq: Every day | ORAL | Status: DC
  Administered 2012-01-25 – 2012-01-28 (×4): 100 mg via ORAL
  Filled 2012-01-25 (×4): qty 1

## 2012-01-25 MED ORDER — ENOXAPARIN SODIUM 100 MG/ML ~~LOC~~ SOLN
1.0000 mg/kg | Freq: Two times a day (BID) | SUBCUTANEOUS | Status: DC
  Administered 2012-01-25 – 2012-01-28 (×6): 95 mg via SUBCUTANEOUS
  Filled 2012-01-25 (×7): qty 1

## 2012-01-25 MED ORDER — OXYCODONE HCL 5 MG PO TABS
5.0000 mg | ORAL_TABLET | ORAL | Status: DC | PRN
  Administered 2012-01-25 – 2012-01-27 (×7): 5 mg via ORAL
  Filled 2012-01-25 (×7): qty 1

## 2012-01-25 MED ORDER — ENOXAPARIN SODIUM 40 MG/0.4ML ~~LOC~~ SOLN
40.0000 mg | SUBCUTANEOUS | Status: DC
  Administered 2012-01-25: 40 mg via SUBCUTANEOUS
  Filled 2012-01-25: qty 0.4

## 2012-01-25 MED ORDER — CALCIUM CARBONATE ANTACID 500 MG PO CHEW
2.0000 | CHEWABLE_TABLET | Freq: Three times a day (TID) | ORAL | Status: DC
  Administered 2012-01-25 (×2): 400 mg via ORAL
  Filled 2012-01-25 (×12): qty 2

## 2012-01-25 MED ORDER — ONDANSETRON HCL 4 MG/2ML IJ SOLN: 4.0000 mg | Freq: Four times a day (QID) | INTRAMUSCULAR | Status: DC | PRN

## 2012-01-25 MED ORDER — SODIUM CHLORIDE 0.9 % IJ SOLN: 3.0000 mL | INTRAMUSCULAR | Status: DC | PRN

## 2012-01-25 MED ORDER — POLYETHYLENE GLYCOL 3350 17 G PO PACK
17.0000 g | PACK | Freq: Every day | ORAL | Status: DC
  Administered 2012-01-25 – 2012-01-28 (×3): 17 g via ORAL
  Filled 2012-01-25 (×5): qty 1

## 2012-01-25 MED ORDER — DILTIAZEM HCL ER 120 MG PO CP24
120.0000 mg | ORAL_CAPSULE | Freq: Every day | ORAL | Status: DC
  Administered 2012-01-25 – 2012-01-28 (×4): 120 mg via ORAL
  Filled 2012-01-25 (×4): qty 1

## 2012-01-25 MED ORDER — FUROSEMIDE 40 MG PO TABS
40.0000 mg | ORAL_TABLET | Freq: Every day | ORAL | Status: DC
  Administered 2012-01-25 – 2012-01-28 (×4): 40 mg via ORAL
  Filled 2012-01-25 (×4): qty 1

## 2012-01-25 MED ORDER — ACETAMINOPHEN 650 MG RE SUPP: 650.0000 mg | Freq: Four times a day (QID) | RECTAL | Status: DC | PRN

## 2012-01-25 MED ORDER — TRAZODONE HCL 100 MG PO TABS
100.0000 mg | ORAL_TABLET | Freq: Every day | ORAL | Status: DC
  Administered 2012-01-25 – 2012-01-27 (×3): 100 mg via ORAL
  Filled 2012-01-25 (×4): qty 1

## 2012-01-25 MED ORDER — FOLIC ACID-VIT B6-VIT B12 2.5-25-1 MG PO TABS: 1.0000 | ORAL_TABLET | Freq: Every day | ORAL | Status: DC

## 2012-01-25 MED ORDER — CILOSTAZOL 50 MG PO TABS
50.0000 mg | ORAL_TABLET | Freq: Two times a day (BID) | ORAL | Status: DC
  Administered 2012-01-25 – 2012-01-28 (×7): 50 mg via ORAL
  Filled 2012-01-25 (×8): qty 1

## 2012-01-25 MED ORDER — BACLOFEN 20 MG PO TABS
20.0000 mg | ORAL_TABLET | Freq: Four times a day (QID) | ORAL | Status: DC
  Administered 2012-01-25 – 2012-01-28 (×14): 20 mg via ORAL
  Filled 2012-01-25 (×16): qty 1

## 2012-01-25 MED ORDER — HYDROMORPHONE HCL PF 1 MG/ML IJ SOLN: 0.5000 mg | INTRAMUSCULAR | Status: DC | PRN

## 2012-01-25 MED ORDER — ASPIRIN EC 81 MG PO TBEC
81.0000 mg | DELAYED_RELEASE_TABLET | Freq: Every day | ORAL | Status: DC
  Administered 2012-01-25 – 2012-01-28 (×4): 81 mg via ORAL
  Filled 2012-01-25 (×4): qty 1

## 2012-01-25 MED ORDER — ONDANSETRON HCL 4 MG PO TABS: 4.0000 mg | ORAL_TABLET | Freq: Four times a day (QID) | ORAL | Status: DC | PRN

## 2012-01-25 MED ORDER — SIMVASTATIN 20 MG PO TABS
20.0000 mg | ORAL_TABLET | Freq: Every evening | ORAL | Status: DC
  Administered 2012-01-25 – 2012-01-28 (×4): 20 mg via ORAL
  Filled 2012-01-25 (×4): qty 1

## 2012-01-25 MED ORDER — NITROGLYCERIN 0.4 MG SL SUBL: 0.4000 mg | SUBLINGUAL_TABLET | SUBLINGUAL | Status: DC | PRN

## 2012-01-25 MED ORDER — POTASSIUM CHLORIDE CRYS ER 20 MEQ PO TBCR
40.0000 meq | EXTENDED_RELEASE_TABLET | Freq: Every day | ORAL | Status: DC
  Administered 2012-01-25 – 2012-01-28 (×4): 40 meq via ORAL
  Filled 2012-01-25 (×4): qty 2

## 2012-01-25 MED ORDER — TIZANIDINE HCL 4 MG PO TABS
4.0000 mg | ORAL_TABLET | Freq: Four times a day (QID) | ORAL | Status: DC
  Administered 2012-01-25 – 2012-01-28 (×14): 4 mg via ORAL
  Filled 2012-01-25 (×23): qty 1

## 2012-01-25 MED ORDER — METOPROLOL TARTRATE 25 MG PO TABS
25.0000 mg | ORAL_TABLET | Freq: Every day | ORAL | Status: DC
  Administered 2012-01-25 – 2012-01-27 (×3): 25 mg via ORAL
  Filled 2012-01-25 (×3): qty 1

## 2012-01-25 MED ORDER — PANTOPRAZOLE SODIUM 40 MG PO TBEC
40.0000 mg | DELAYED_RELEASE_TABLET | Freq: Every day | ORAL | Status: DC
  Administered 2012-01-25 – 2012-01-27 (×3): 40 mg via ORAL
  Filled 2012-01-25 (×3): qty 1

## 2012-01-25 MED ORDER — CLOPIDOGREL BISULFATE 75 MG PO TABS
75.0000 mg | ORAL_TABLET | Freq: Every day | ORAL | Status: DC
  Administered 2012-01-25 – 2012-01-28 (×4): 75 mg via ORAL
  Filled 2012-01-25 (×5): qty 1

## 2012-01-25 MED ORDER — RAMIPRIL 2.5 MG PO CAPS
2.5000 mg | ORAL_CAPSULE | Freq: Every day | ORAL | Status: DC
  Administered 2012-01-25 – 2012-01-27 (×3): 2.5 mg via ORAL
  Filled 2012-01-25 (×3): qty 1

## 2012-01-25 MED ORDER — SODIUM CHLORIDE 0.9 % IJ SOLN
3.0000 mL | Freq: Two times a day (BID) | INTRAMUSCULAR | Status: DC
  Administered 2012-01-25 – 2012-01-28 (×7): 3 mL via INTRAVENOUS

## 2012-01-25 NOTE — ED Notes (Signed)
Patient back from x-ray; currently sitting up in bed; no respiratory or acute distress noted.  Patient updated on plan of care; informed patient that we are currently waiting on x-ray results.  Patient has no other questions or concerns at this time; will continue to monitor.

## 2012-01-25 NOTE — ED Notes (Signed)
Calling report now. 

## 2012-01-25 NOTE — Progress Notes (Signed)
Subjective: Asymptomatic.  Objective: Vital signs in last 24 hours: Temp:  [97.9 F (36.6 C)-98.6 F (37 C)] 97.9 F (36.6 C) (05/11 0445) Pulse Rate:  [56-72] 62  (05/11 1030) Resp:  [17-31] 20  (05/11 0445) BP: (116-149)/(58-77) 128/74 mmHg (05/11 1030) SpO2:  [94 %-95 %] 94 % (05/11 0816) FiO2 (%):  [2 %] 2 % (05/10 2216) Weight:  [95.9 kg (211 lb 6.7 oz)] 95.9 kg (211 lb 6.7 oz) (05/11 0247) Weight change:     Intake/Output from previous day: 05/10 0701 - 05/11 0700 In: 240 [P.O.:240] Out: 325 [Urine:325]     Physical Exam: General: Comfortable, alert, communicative, fully oriented, not short of breath at rest.  HEENT:  No clinical pallor, no jaundice, no conjunctival injection or discharge. Hydration status is fair. NECK:  Supple, JVP not seen, no carotid bruits, no palpable lymphadenopathy, no palpable goiter. CHEST:  Clinically clear to auscultation, no wheezes, no crackles. HEART:  Sounds 1 and 2 heard, normal, regular, no murmurs. ABDOMEN:  Difficult to properly examine, as patient is sitting, but soft Full, soft, moderately obese, non-tender, Normal bowel sounds. GENITALIA:  Not examined.. LOWER EXTREMITIES:  No pitting edema, palpable peripheral pulses. MUSCULOSKELETAL SYSTEM:  Generalized osteoarthritic changes, otherwise, normal. CENTRAL NERVOUS SYSTEM:  No focal neurologic deficit on gross examination.  Lab Results:  Basename 01/25/12 0730 01/24/12 2315  WBC 12.3* 12.5*  HGB 14.3 15.8  HCT 41.9 44.9  PLT 235 263    Basename 01/25/12 0730 01/24/12 2315  NA 138 138  K 3.3* 3.7  CL 95* 95*  CO2 30 32  GLUCOSE 127* 124*  BUN 21 21  CREATININE 1.12 1.30  CALCIUM 9.0 9.3   No results found for this or any previous visit (from the past 240 hour(s)).   Studies/Results: Dg Chest 2 View  01/25/2012  *RADIOLOGY REPORT*  Clinical Data: Chest pain, shortness of breath, chronic bronchitis, COPD.  Smoker.  CHEST - 2 VIEW  Comparison: 05/14/2010  Findings:  The right costophrenic angle is excluded from the field of view.  Motion artifact.  Mild cardiac enlargement with normal pulmonary vascularity.  No focal airspace consolidation.  No pneumothorax.  Emphysematous changes in the lungs.  Degenerative changes in the thoracic spine.  IMPRESSION: Technically limited study.  No evidence of active pulmonary disease.  Original Report Authenticated By: Marlon Pel, M.D.    Medications: Scheduled Meds:   . allopurinol  100 mg Oral Daily  . aspirin EC  325 mg Oral Daily  . aspirin EC  81 mg Oral Daily  . baclofen  20 mg Oral QID  . calcium carbonate  2 tablet Oral TID  . cilostazol  50 mg Oral BID  . clopidogrel  75 mg Oral Q breakfast  . diltiazem  120 mg Oral Daily  . enoxaparin  40 mg Subcutaneous Q24H  . Fluticasone-Salmeterol  1 puff Inhalation Q12H  . furosemide  40 mg Oral Daily  . l-methylfolate-B6-B12  1 tablet Oral Daily  . metoprolol  25 mg Oral Daily  . pantoprazole  40 mg Oral Q1200  . ramipril  2.5 mg Oral Daily  . simvastatin  20 mg Oral QPM  . sodium chloride  3 mL Intravenous Q12H  . tiZANidine  4 mg Oral Q6H  . traZODone  100 mg Oral QHS  . DISCONTD: Folic Acid-Vit B6-Vit B12  1 tablet Oral Daily   Continuous Infusions:  PRN Meds:.sodium chloride, acetaminophen, acetaminophen, HYDROmorphone (DILAUDID) injection, ondansetron (ZOFRAN) IV, ondansetron, oxyCODONE, sodium  chloride, zolpidem  Assessment/Plan:  Principal Problem:  *Chest pain: Patient presented with recurrent episodes of chest pain. At least 3 episodes, since 9:00 PM on 5/10./13. He was given 4 tabs of ASA, en route to the ED, and pain was relieved by SL NTG. He has had no further episodes since, EKG shows SR, without acute ischemic changes and first set of cardiac enzymes is negative. Patient has significant risk factors, including age, gender, DM, PVD and very strong family history of CAD. Stratification is needed, and I suspect he will need cardiac  catheterization. Dr Young Berry is his primary cardiologist. Consultation has been requested from [facility]. Antiplatelet therapy continues. Active Problems:  1. DIABETES, TYPE 2: Per patient, this is diet controlled. CBG is normal at this time. We shall check HBA1C.  2. Atrial fibrillation: This is paroxysmal, as patient is currently in SR and rate-controlled. Clinically, he has no overt evidence of CHF. Pre-admission Cardizem and beta-blocker.Patient is on diuretics and ACE-i.  3. PERIPHERAL VASCULAR DISEASE: Clinically stable at this time.  4. COPD: Stable/asymptomatic. Unfortunately, patient continues to smoke.  5. Chronic back pain: Appears adequately pain-controlled at this time. 6. : Tobacco abuse: Patient has been counseled appropriately.  Comment: Disposition, per Cardiology.    LOS: 1 day   Chad Morse,CHRISTOPHER 01/25/2012, 12:20 PM

## 2012-01-25 NOTE — H&P (Addendum)
DATE OF ADMISSION:  01/25/2012  PCP:    Fredirick Maudlin, MD, MD   Chief Complaint: Chest Pain   HPI: Chad Morse is an 71 y.o. male who presents to the ED with complaints of 2 episodes of chest pain.  The first episode occurred at  8 pm and was described as being a jabbing pain in the left chest area below there nipple, and had radiation into his back and neck.  He states the discomfort lasted 3 minutes at a time and kept recurring and was associated with diaphoresis.  He was evaluated in the ED and had a negative first set of cardiac markers.     Past Medical History  Diagnosis Date  . COPD (chronic obstructive pulmonary disease)   . Diabetes mellitus   . Atrial fibrillation   . Peripheral neuropathy   . Gallstones   . Peripheral vascular disease     History reviewed. No pertinent past surgical history.  Medications:  HOME MEDS: Prior to Admission medications   Medication Sig Start Date End Date Taking? Authorizing Provider  allopurinol (ZYLOPRIM) 100 MG tablet Take 100 mg by mouth daily.   Yes Historical Provider, MD  aspirin EC 81 MG tablet Take 81 mg by mouth daily.   Yes Historical Provider, MD  baclofen (LIORESAL) 20 MG tablet Take 20 mg by mouth 4 (four) times daily.   Yes Historical Provider, MD  calcium carbonate (TUMS - DOSED IN MG ELEMENTAL CALCIUM) 500 MG chewable tablet Chew 2 tablets by mouth 3 (three) times daily as needed. For indigestion   Yes Historical Provider, MD  cilostazol (PLETAL) 50 MG tablet Take 50 mg by mouth 2 (two) times daily.   Yes Historical Provider, MD  clopidogrel (PLAVIX) 75 MG tablet Take 75 mg by mouth daily.   Yes Historical Provider, MD  diltiazem (DILACOR XR) 120 MG 24 hr capsule Take 120 mg by mouth daily.   Yes Historical Provider, MD  Fluticasone-Salmeterol (ADVAIR) 250-50 MCG/DOSE AEPB Inhale 1 puff into the lungs every 12 (twelve) hours.   Yes Historical Provider, MD  Folic Acid-Vit B6-Vit B12 (FOLBEE) 2.5-25-1 MG TABS Take  1 tablet by mouth daily.   Yes Historical Provider, MD  furosemide (LASIX) 40 MG tablet Take 40 mg by mouth daily.   Yes Historical Provider, MD  HYDROcodone-acetaminophen (NORCO) 10-325 MG per tablet Take 1 tablet by mouth 2 (two) times daily as needed. For pain   Yes Historical Provider, MD  magnesium hydroxide (PHILLIPS CHEWS) 311 MG CHEW Chew 311 mg by mouth every 4 (four) hours as needed. For indigestion   Yes Historical Provider, MD  metoprolol (LOPRESSOR) 50 MG tablet Take 25 mg by mouth daily.   Yes Historical Provider, MD  naproxen sodium (ANAPROX) 220 MG tablet Take 220 mg by mouth 2 (two) times daily as needed. For leg pain   Yes Historical Provider, MD  pantoprazole (PROTONIX) 40 MG tablet Take 40 mg by mouth daily.   Yes Historical Provider, MD  ramipril (ALTACE) 2.5 MG capsule Take 2.5 mg by mouth daily.   Yes Historical Provider, MD  simvastatin (ZOCOR) 20 MG tablet Take 20 mg by mouth every evening.   Yes Historical Provider, MD  tiZANidine (ZANAFLEX) 4 MG tablet Take 4 mg by mouth every 6 (six) hours.   Yes Historical Provider, MD  traZODone (DESYREL) 50 MG tablet Take 100 mg by mouth at bedtime.   Yes Historical Provider, MD  zolpidem (AMBIEN CR) 12.5 MG CR tablet Take 12.5  mg by mouth at bedtime. For sleep   Yes Historical Provider, MD    Allergies:  Allergies  Allergen Reactions  . Ciprofloxacin Hives  . Penicillins Other (See Comments)    REACTION: hives  . Sulfonamide Derivatives Other (See Comments)    REACTION: hives    Social History:   reports that he has been smoking.  He does not have any smokeless tobacco history on file. He reports that he does not drink alcohol or use illicit drugs.  Family History: History reviewed. No pertinent family history.  Review of Systems:  The patient denies anorexia, fever, weight loss, transient blindness, difficulty walking, depression, unusual weight change, abnormal bleeding, enlarged lymph nodes, angioedema, and breast  masses.   Physical Exam:  GEN:  Pleasant 71 year old elderly Obese Caucasian male examined  and in no acute distress; cooperative with exam Filed Vitals:   01/25/12 0130 01/25/12 0200 01/25/12 0247 01/25/12 0445  BP: 119/77 125/62 149/76 131/75  Pulse: 60 63 61 56  Temp:  98.2 F (36.8 C) 97.9 F (36.6 C) 97.9 F (36.6 C)  TempSrc:  Oral Oral Oral  Resp: 17 18 19 20   Height:   6' (1.829 m)   Weight:   95.9 kg (211 lb 6.7 oz)   SpO2: 94% 95% 94% 94%   Blood pressure 131/75, pulse 56, temperature 97.9 F (36.6 C), temperature source Oral, resp. rate 20, height 6' (1.829 m), weight 95.9 kg (211 lb 6.7 oz), SpO2 94.00%. PSYCH: He is alert and oriented x4; does not appear anxious does not appear depressed; affect is normal HEENT: Normocephalic and Atraumatic, Mucous membranes pink; PERRLA; EOM intact; Fundi:  Benign;  No scleral icterus, Nares: Patent, Oropharynx: Clear, Edentulous or Fair , Neck:  FROM, no cervical lymphadenopathy nor thyromegaly or carotid bruit; no JVD; Breasts:: Not examined CHEST WALL: No tenderness CHEST: Normal respiration, clear to auscultation bilaterally HEART: Regular rate and rhythm; no murmurs rubs or gallops BACK: No kyphosis or scoliosis; no CVA tenderness ABDOMEN: Positive Bowel Sounds, Obese, soft non-tender; no masses, no organomegaly, no pannus; no intertriginous candida. Rectal Exam: Not done EXTREMITIES: No bone or joint deformity; age-appropriate arthropathy of the hands and knees; no cyanosis, clubbing or edema; no ulcerations. Genitalia: not examined PULSES: 2+ and symmetric SKIN: Normal hydration no rash or ulceration CNS: Cranial nerves 2-12 grossly intact no focal neurologic deficit   Labs & Imaging Results for orders placed during the hospital encounter of 01/24/12 (from the past 48 hour(s))  CARDIAC PANEL(CRET KIN+CKTOT+MB+TROPI)     Status: Normal   Collection Time   01/24/12 11:15 PM      Component Value Range Comment   Total CK 230   7 - 232 (U/L)    CK, MB 1.7  0.3 - 4.0 (ng/mL)    Troponin I <0.30  <0.30 (ng/mL)    Relative Index 0.7  0.0 - 2.5    BASIC METABOLIC PANEL     Status: Abnormal   Collection Time   01/24/12 11:15 PM      Component Value Range Comment   Sodium 138  135 - 145 (mEq/L)    Potassium 3.7  3.5 - 5.1 (mEq/L)    Chloride 95 (*) 96 - 112 (mEq/L)    CO2 32  19 - 32 (mEq/L)    Glucose, Bld 124 (*) 70 - 99 (mg/dL)    BUN 21  6 - 23 (mg/dL)    Creatinine, Ser 1.61  0.50 - 1.35 (mg/dL)  Calcium 9.3  8.4 - 10.5 (mg/dL)    GFR calc non Af Amer 53 (*) >90 (mL/min)    GFR calc Af Amer 62 (*) >90 (mL/min)   CBC     Status: Abnormal   Collection Time   01/24/12 11:15 PM      Component Value Range Comment   WBC 12.5 (*) 4.0 - 10.5 (K/uL)    RBC 5.21  4.22 - 5.81 (MIL/uL)    Hemoglobin 15.8  13.0 - 17.0 (g/dL)    HCT 16.1  09.6 - 04.5 (%)    MCV 86.2  78.0 - 100.0 (fL)    MCH 30.3  26.0 - 34.0 (pg)    MCHC 35.2  30.0 - 36.0 (g/dL)    RDW 40.9  81.1 - 91.4 (%)    Platelets 263  150 - 400 (K/uL)   PROTIME-INR     Status: Normal   Collection Time   01/24/12 11:15 PM      Component Value Range Comment   Prothrombin Time 12.9  11.6 - 15.2 (seconds)    INR 0.95  0.00 - 1.49     Dg Chest 2 View  01/25/2012  *RADIOLOGY REPORT*  Clinical Data: Chest pain, shortness of breath, chronic bronchitis, COPD.  Smoker.  CHEST - 2 VIEW  Comparison: 05/14/2010  Findings: The right costophrenic angle is excluded from the field of view.  Motion artifact.  Mild cardiac enlargement with normal pulmonary vascularity.  No focal airspace consolidation.  No pneumothorax.  Emphysematous changes in the lungs.  Degenerative changes in the thoracic spine.  IMPRESSION: Technically limited study.  No evidence of active pulmonary disease.  Original Report Authenticated By: Marlon Pel, M.D.    Assessment:   Present on Admission:  .Chest pain .DIABETES, TYPE 2 .CAD .Atrial fibrillation .C O P D .PERIPHERAL VASCULAR  DISEASE .Peripheral neuropathy .RENAL INSUFFICIENCY    Plan:       Admit to 23 hour Observation Status for a Cardiac Rule out.   Cardiac enzymes q 8 hrs X 3 Nitrodur Patch, O2, and ASA therapy Further cardiac workup and Consultation Pending results of studies DVT prophylaxis Reconcile Home Meds  SSI coverage Other plans as per orders.    CODE STATUS:      FULL CODE         Jarrick Fjeld C 01/25/2012, 5:38 AM

## 2012-01-25 NOTE — Consult Note (Signed)
[facility]       CONSULTATION NOTE   Reason for Consult: Chest pain  Requesting Physician: Dr. Brien Few  Cardiologist: Dr. Alanda Amass  HPI: This is a 71 y.o. male with a past medical history significant for known CAD which is described as non-critical by LHC in 2004 (non-dominant RCA, mild to moderate distal LCX and 50% LAD lesion, preserved LV function), also a history of PAD with angiogram in 2010 for non-healing ulcer which ultimately did heal.  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 He now presents with stabbing chest pain which radiated to his back and neck which lasted for a few minutes. There was reported diaphoresis and mild nausea. The pain improved after nitroglycerin use. Cardiac markers have been negative. We are asked to consult for cardiac management.  PMHx:  Past Medical History  Diagnosis Date  . COPD (chronic obstructive pulmonary disease)   . Diabetes mellitus   . Atrial fibrillation   . Peripheral neuropathy   . Gallstones   . Peripheral vascular disease    History reviewed. No pertinent past surgical history.  FAMHx: History reviewed. No pertinent family history.  SOCHx:  reports that he has been smoking.  He does not have any smokeless tobacco history on file. He reports that he does not drink alcohol or use illicit drugs.  ALLERGIES: Allergies  Allergen Reactions  . Ciprofloxacin Hives  . Penicillins Other (See Comments)    REACTION: hives  . Sulfonamide Derivatives Other (See Comments)    REACTION: hives    ROS: A comprehensive review of systems was negative except for: Cardiovascular: positive for chest pain and diaphoresis  HOME MEDICATIONS: Prescriptions prior to admission  Medication Sig Dispense Refill  . allopurinol (ZYLOPRIM) 100 MG tablet Take 100 mg by mouth daily.      Marland Kitchen aspirin EC 81 MG tablet Take 81 mg by  mouth daily.      . baclofen (LIORESAL) 20 MG tablet Take 20 mg by mouth 4 (four) times daily.      . calcium carbonate (TUMS - DOSED IN MG ELEMENTAL CALCIUM) 500 MG chewable tablet Chew 2 tablets by mouth 3 (three) times daily as needed. For indigestion      . cilostazol (PLETAL) 50 MG tablet Take 50 mg by mouth 2 (two) times daily.      . clopidogrel (PLAVIX) 75 MG tablet Take 75 mg by mouth daily.      Marland Kitchen diltiazem (DILACOR XR) 120 MG 24 hr capsule Take 120 mg by mouth daily.      . Fluticasone-Salmeterol (ADVAIR) 250-50 MCG/DOSE AEPB Inhale 1 puff into the lungs every 12 (twelve) hours.      . Folic Acid-Vit B6-Vit B12 (FOLBEE) 2.5-25-1 MG TABS Take 1 tablet by mouth daily.      . furosemide (LASIX) 40 MG tablet Take 40 mg by mouth daily.      Marland Kitchen HYDROcodone-acetaminophen (NORCO) 10-325 MG per tablet Take 1 tablet by mouth 2 (two) times daily as needed. For pain      . magnesium hydroxide (PHILLIPS CHEWS) 311 MG CHEW Chew 311 mg by mouth every 4 (four) hours as needed. For indigestion      . metoprolol (LOPRESSOR) 50 MG tablet Take 25 mg by mouth daily.      . naproxen sodium (ANAPROX) 220 MG tablet Take 220 mg by mouth 2 (two) times daily as needed. For  leg pain      . pantoprazole (PROTONIX) 40 MG tablet Take 40 mg by mouth daily.      . ramipril (ALTACE) 2.5 MG capsule Take 2.5 mg by mouth daily.      . simvastatin (ZOCOR) 20 MG tablet Take 20 mg by mouth every evening.      Marland Kitchen tiZANidine (ZANAFLEX) 4 MG tablet Take 4 mg by mouth every 6 (six) hours.      . traZODone (DESYREL) 50 MG tablet Take 100 mg by mouth at bedtime.      Marland Kitchen zolpidem (AMBIEN CR) 12.5 MG CR tablet Take 12.5 mg by mouth at bedtime. For sleep        HOSPITAL MEDICATIONS: Prior to Admission:  Prescriptions prior to admission  Medication Sig Dispense Refill  . allopurinol (ZYLOPRIM) 100 MG tablet Take 100 mg by mouth daily.      Marland Kitchen aspirin EC 81 MG tablet Take 81 mg by mouth daily.      . baclofen (LIORESAL) 20 MG tablet  Take 20 mg by mouth 4 (four) times daily.      . calcium carbonate (TUMS - DOSED IN MG ELEMENTAL CALCIUM) 500 MG chewable tablet Chew 2 tablets by mouth 3 (three) times daily as needed. For indigestion      . cilostazol (PLETAL) 50 MG tablet Take 50 mg by mouth 2 (two) times daily.      . clopidogrel (PLAVIX) 75 MG tablet Take 75 mg by mouth daily.      Marland Kitchen diltiazem (DILACOR XR) 120 MG 24 hr capsule Take 120 mg by mouth daily.      . Fluticasone-Salmeterol (ADVAIR) 250-50 MCG/DOSE AEPB Inhale 1 puff into the lungs every 12 (twelve) hours.      . Folic Acid-Vit B6-Vit B12 (FOLBEE) 2.5-25-1 MG TABS Take 1 tablet by mouth daily.      . furosemide (LASIX) 40 MG tablet Take 40 mg by mouth daily.      Marland Kitchen HYDROcodone-acetaminophen (NORCO) 10-325 MG per tablet Take 1 tablet by mouth 2 (two) times daily as needed. For pain      . magnesium hydroxide (PHILLIPS CHEWS) 311 MG CHEW Chew 311 mg by mouth every 4 (four) hours as needed. For indigestion      . metoprolol (LOPRESSOR) 50 MG tablet Take 25 mg by mouth daily.      . naproxen sodium (ANAPROX) 220 MG tablet Take 220 mg by mouth 2 (two) times daily as needed. For leg pain      . pantoprazole (PROTONIX) 40 MG tablet Take 40 mg by mouth daily.      . ramipril (ALTACE) 2.5 MG capsule Take 2.5 mg by mouth daily.      . simvastatin (ZOCOR) 20 MG tablet Take 20 mg by mouth every evening.      Marland Kitchen tiZANidine (ZANAFLEX) 4 MG tablet Take 4 mg by mouth every 6 (six) hours.      . traZODone (DESYREL) 50 MG tablet Take 100 mg by mouth at bedtime.      Marland Kitchen zolpidem (AMBIEN CR) 12.5 MG CR tablet Take 12.5 mg by mouth at bedtime. For sleep        VITALS: Blood pressure 128/74, pulse 62, temperature 97.9 F (36.6 C), temperature source Oral, resp. rate 20, height 6' (1.829 m), weight 95.9 kg (211 lb 6.7 oz), SpO2 94.00%.  PHYSICAL EXAM: General appearance: alert and no distress Neck: no adenopathy, no carotid bruit, no JVD, supple, symmetrical, trachea midline and  thyroid not enlarged, symmetric, no tenderness/mass/nodules Lungs: diminished breath sounds bilaterally and rhonchi bilaterally Heart: irregularly irregular rhythm Abdomen: soft, non-tender; bowel sounds normal; no masses,  no organomegaly Extremities: extremities normal, atraumatic, no cyanosis or edema Pulses: 1+ pulses Skin: pale, dry Neurologic: Mental status: Alert, oriented, thought content appropriate  LABS: Results for orders placed during the hospital encounter of 01/24/12 (from the past 48 hour(s))  CARDIAC PANEL(CRET KIN+CKTOT+MB+TROPI)     Status: Normal   Collection Time   01/24/12 11:15 PM      Component Value Range Comment   Total CK 230  7 - 232 (U/L)    CK, MB 1.7  0.3 - 4.0 (ng/mL)    Troponin I <0.30  <0.30 (ng/mL)    Relative Index 0.7  0.0 - 2.5    BASIC METABOLIC PANEL     Status: Abnormal   Collection Time   01/24/12 11:15 PM      Component Value Range Comment   Sodium 138  135 - 145 (mEq/L)    Potassium 3.7  3.5 - 5.1 (mEq/L)    Chloride 95 (*) 96 - 112 (mEq/L)    CO2 32  19 - 32 (mEq/L)    Glucose, Bld 124 (*) 70 - 99 (mg/dL)    BUN 21  6 - 23 (mg/dL)    Creatinine, Ser 1.61  0.50 - 1.35 (mg/dL)    Calcium 9.3  8.4 - 10.5 (mg/dL)    GFR calc non Af Amer 53 (*) >90 (mL/min)    GFR calc Af Amer 62 (*) >90 (mL/min)   CBC     Status: Abnormal   Collection Time   01/24/12 11:15 PM      Component Value Range Comment   WBC 12.5 (*) 4.0 - 10.5 (K/uL)    RBC 5.21  4.22 - 5.81 (MIL/uL)    Hemoglobin 15.8  13.0 - 17.0 (g/dL)    HCT 09.6  04.5 - 40.9 (%)    MCV 86.2  78.0 - 100.0 (fL)    MCH 30.3  26.0 - 34.0 (pg)    MCHC 35.2  30.0 - 36.0 (g/dL)    RDW 81.1  91.4 - 78.2 (%)    Platelets 263  150 - 400 (K/uL)   PROTIME-INR     Status: Normal   Collection Time   01/24/12 11:15 PM      Component Value Range Comment   Prothrombin Time 12.9  11.6 - 15.2 (seconds)    INR 0.95  0.00 - 1.49    BASIC METABOLIC PANEL     Status: Abnormal   Collection Time    01/25/12  7:30 AM      Component Value Range Comment   Sodium 138  135 - 145 (mEq/L)    Potassium 3.3 (*) 3.5 - 5.1 (mEq/L)    Chloride 95 (*) 96 - 112 (mEq/L)    CO2 30  19 - 32 (mEq/L)    Glucose, Bld 127 (*) 70 - 99 (mg/dL)    BUN 21  6 - 23 (mg/dL)    Creatinine, Ser 9.56  0.50 - 1.35 (mg/dL)    Calcium 9.0  8.4 - 10.5 (mg/dL)    GFR calc non Af Amer 64 (*) >90 (mL/min)    GFR calc Af Amer 74 (*) >90 (mL/min)   CBC     Status: Abnormal   Collection Time   01/25/12  7:30 AM      Component Value Range Comment   WBC 12.3 (*)  4.0 - 10.5 (K/uL)    RBC 4.87  4.22 - 5.81 (MIL/uL)    Hemoglobin 14.3  13.0 - 17.0 (g/dL)    HCT 40.9  81.1 - 91.4 (%)    MCV 86.0  78.0 - 100.0 (fL)    MCH 29.4  26.0 - 34.0 (pg)    MCHC 34.1  30.0 - 36.0 (g/dL)    RDW 78.2  95.6 - 21.3 (%)    Platelets 235  150 - 400 (K/uL)     IMAGING: Dg Chest 2 View  01/25/2012  *RADIOLOGY REPORT*  Clinical Data: Chest pain, shortness of breath, chronic bronchitis, COPD.  Smoker.  CHEST - 2 VIEW  Comparison: 05/14/2010  Findings: The right costophrenic angle is excluded from the field of view.  Motion artifact.  Mild cardiac enlargement with normal pulmonary vascularity.  No focal airspace consolidation.  No pneumothorax.  Emphysematous changes in the lungs.  Degenerative changes in the thoracic spine.  IMPRESSION: Technically limited study.  No evidence of active pulmonary disease.  Original Report Authenticated By: Marlon Pel, M.D.   EKG: Sinus rhythm with RBBB and LPFB - superior right axis. No ischemic changes.  IMPRESSION:  1.  Chest pain, possibly unstable angina 2.  Leukocytosis / increased sputum production - consider acute exacerbation of chronic bronchitis 3.  Hypokalemia 4.  Paroxysmal atrial fibrillation - not on coumadin due to significant hemoptysis in the past  RECOMMENDATION:  1.   Sharp chest pain, occurred several times, seems to have resolved spontaneously around the time of receiving  nitro. He had diaphoresis. History of non-obstructive CAD in 2004 by cath, but no anginal issues since that time. Second set of cardiac markers have not been resulted. He is currently on aspirin and plavix, which he takes chronically. Will start therapeutic lovenox, as he is currently on prophylactic lovenox to avoid crossover.  If he rules-out for ACS, will plan NST on Monday. Replete K+ today. Consider antibiotics for leukocytosis with increased sputum production.  Thanks for the consult. We will follow with you.  Time Spent Directly with Patient: 30 minutes  Chrystie Nose, MD, [facility] Attending Cardiologist [facility]  Prairie Stenberg C 01/25/2012, 1:28 PM

## 2012-01-25 NOTE — ED Notes (Signed)
Patient currently resting quietly in bed; no respiratory or acute distress noted.  Family and Dr. Norlene Campbell at bedside.  Patient has no other questions or concerns at this time; will continue to monitor.

## 2012-01-25 NOTE — Progress Notes (Signed)
CC: Chest pain.  Anticoag: Started on full dose lovenox for CP.  Received 40mg  sq of lovenox on 05/11 at 10am.    Plan:  1) Start Lovenox 95mg  sq q12h, 1st dose tonight (Ok per MD) 2) CBC q72 hours

## 2012-01-25 NOTE — ED Provider Notes (Signed)
History     CSN: [id number]  Arrival date & time 01/24/12  2207   First MD Initiated Contact with Patient 01/24/12 2259      Chief Complaint  Patient presents with  . Chest Pain    (Consider location/radiation/quality/duration/timing/severity/associated sxs/prior treatment) HPI 71 year old male presents emergency department with complaint of chest pain. Patient had episode of bilateral leg pain last night around 8 PM. Patient reports he has history of neuropathy, leg pain was similar to his prior history of neuropathy. Soon after the leg pain, patient "felt funny" became sweaty diaphoretic clammy and cool to the touch. Symptoms lasted about 30 minutes then resolved. Patient had a chest pain starting this evening occurring every 15 minutes lasting 2-3 minutes. Symptoms went on for about 45 minutes. Patient reports pain was 7/10, and pain was located on the left side of his chest. Patient denies shortness of breath but did feel clammy and diaphoretic again. Pain was described as a pressure tightness. Patient has history of cardiomyopathy and atrial fibrillation  and is followed by Dr. Alanda Amass. Patient has medically managed coronary artery disease. He has history of COPD, diabetes area patient was transported via EMS from home. Patient reports resolution of pain after nitroglycerin. Patient had full dose aspirin by EMS Past Medical History  Diagnosis Date  . COPD (chronic obstructive pulmonary disease)   . Diabetes mellitus   . Atrial fibrillation   . Peripheral neuropathy   . Gallstones     History reviewed. No pertinent past surgical history.  History reviewed. No pertinent family history.  History  Substance Use Topics  . Smoking status: Current Everyday Smoker -- 0.5 packs/day  . Smokeless tobacco: Not on file  . Alcohol Use: No      Review of Systems  All other systems reviewed and are negative.    Allergies  Ciprofloxacin; Penicillins; and Sulfonamide  derivatives  Home Medications   Current Outpatient Rx  Name Route Sig Dispense Refill  . ALLOPURINOL 100 MG PO TABS Oral Take 100 mg by mouth daily.    . ASPIRIN EC 81 MG PO TBEC Oral Take 81 mg by mouth daily.    Marland Kitchen BACLOFEN 20 MG PO TABS Oral Take 20 mg by mouth 4 (four) times daily.    Marland Kitchen CALCIUM CARBONATE ANTACID 500 MG PO CHEW Oral Chew 2 tablets by mouth 3 (three) times daily as needed. For indigestion    . CILOSTAZOL 50 MG PO TABS Oral Take 50 mg by mouth 2 (two) times daily.    Marland Kitchen CLOPIDOGREL BISULFATE 75 MG PO TABS Oral Take 75 mg by mouth daily.    Marland Kitchen DILTIAZEM HCL ER 120 MG PO CP24 Oral Take 120 mg by mouth daily.    Marland Kitchen FLUTICASONE-SALMETEROL 250-50 MCG/DOSE IN AEPB Inhalation Inhale 1 puff into the lungs every 12 (twelve) hours.    Marland Kitchen FOLIC ACID-VIT B6-VIT B12 2.5-25-1 MG PO TABS Oral Take 1 tablet by mouth daily.    . FUROSEMIDE 40 MG PO TABS Oral Take 40 mg by mouth daily.    Marland Kitchen HYDROCODONE-ACETAMINOPHEN 10-325 MG PO TABS Oral Take 1 tablet by mouth 2 (two) times daily as needed. For pain    . MAGNESIUM HYDROXIDE 311 MG PO CHEW Oral Chew 311 mg by mouth every 4 (four) hours as needed. For indigestion    . METOPROLOL TARTRATE 50 MG PO TABS Oral Take 25 mg by mouth daily.    Marland Kitchen NAPROXEN SODIUM 220 MG PO TABS Oral Take 220 mg by  mouth 2 (two) times daily as needed. For leg pain    . PANTOPRAZOLE SODIUM 40 MG PO TBEC Oral Take 40 mg by mouth daily.    Marland Kitchen RAMIPRIL 2.5 MG PO CAPS Oral Take 2.5 mg by mouth daily.    Marland Kitchen SIMVASTATIN 20 MG PO TABS Oral Take 20 mg by mouth every evening.    Marland Kitchen TIZANIDINE HCL 4 MG PO TABS Oral Take 4 mg by mouth every 6 (six) hours.    . TRAZODONE HCL 50 MG PO TABS Oral Take 100 mg by mouth at bedtime.    Marland Kitchen ZOLPIDEM TARTRATE ER 12.5 MG PO TBCR Oral Take 12.5 mg by mouth at bedtime. For sleep      BP 133/62  Pulse 62  Temp(Src) 98 F (36.7 C) (Oral)  Resp 19  SpO2 94%  Physical Exam  Nursing note and vitals reviewed. Constitutional: He appears  well-developed and well-nourished.       Frail elderly-appearing  HENT:  Head: Normocephalic and atraumatic.  Eyes: Conjunctivae and EOM are normal. Pupils are equal, round, and reactive to light.  Neck: Normal range of motion. Neck supple. No JVD present. No tracheal deviation present. No thyromegaly present.  Cardiovascular: Normal rate, regular rhythm, normal heart sounds and intact distal pulses.  Exam reveals no gallop and no friction rub.   No murmur heard. Pulmonary/Chest: Effort normal and breath sounds normal. No stridor. No respiratory distress. He has no wheezes. He has no rales. He exhibits no tenderness.  Abdominal: Soft. Bowel sounds are normal. He exhibits no distension and no mass. There is no tenderness. There is no rebound and no guarding.  Musculoskeletal: Normal range of motion. He exhibits edema (trace edema). He exhibits no tenderness.  Lymphadenopathy:    He has no cervical adenopathy.  Neurological: He is alert.  Skin: Skin is warm and dry. No rash noted. No erythema. No pallor.  Psychiatric: He has a normal mood and affect. His behavior is normal. Judgment and thought content normal.    ED Course  Procedures (including critical care time)   Date: 01/25/2012  Rate: 70  Rhythm: normal sinus rhythm  QRS Axis: left  Intervals: normal  ST/T Wave abnormalities: normal  Conduction Disutrbances:right bundle branch block and left posterior fascicular block  Narrative Interpretation: new LPFB  Old EKG Reviewed: changes noted    Labs Reviewed  BASIC METABOLIC PANEL - Abnormal; Notable for the following:    Chloride 95 (*)    Glucose, Bld 124 (*)    GFR calc non Af Amer 53 (*)    GFR calc Af Amer 62 (*)    All other components within normal limits  CBC - Abnormal; Notable for the following:    WBC 12.5 (*)    All other components within normal limits  CARDIAC PANEL(CRET KIN+CKTOT+MB+TROPI)  PROTIME-INR   Dg Chest 2 View  01/25/2012  *RADIOLOGY REPORT*   Clinical Data: Chest pain, shortness of breath, chronic bronchitis, COPD.  Smoker.  CHEST - 2 VIEW  Comparison: 05/14/2010  Findings: The right costophrenic angle is excluded from the field of view.  Motion artifact.  Mild cardiac enlargement with normal pulmonary vascularity.  No focal airspace consolidation.  No pneumothorax.  Emphysematous changes in the lungs.  Degenerative changes in the thoracic spine.  IMPRESSION: Technically limited study.  No evidence of active pulmonary disease.  Original Report Authenticated By: Marlon Pel, M.D.     1. Chest pain       MDM  71 year old gentleman with chest pain tonight and concerning episode for anginal equivalent last night. EKG without ST elevation. Given his medical history will discuss with hospitalist for admission and close monitoring.        Olivia Mackie, MD 01/25/12 [phone]

## 2012-01-25 NOTE — ED Notes (Signed)
Report given to Jasmine December, RN on 2000.  No further questions/concerns from RN.  Informed RN that she can call back with any questions/concerns once patient arrives to floor.

## 2012-01-25 NOTE — ED Notes (Signed)
Patient being transported upstairs on portable cardiac monitor by nurse tele tech.

## 2012-01-26 DIAGNOSIS — I4891 Unspecified atrial fibrillation: Secondary | ICD-10-CM

## 2012-01-26 DIAGNOSIS — R079 Chest pain, unspecified: Secondary | ICD-10-CM

## 2012-01-26 DIAGNOSIS — I10 Essential (primary) hypertension: Secondary | ICD-10-CM

## 2012-01-26 DIAGNOSIS — E1165 Type 2 diabetes mellitus with hyperglycemia: Secondary | ICD-10-CM

## 2012-01-26 LAB — CBC
Hemoglobin: 15.1 g/dL (ref 13.0–17.0)
MCH: 30.1 pg (ref 26.0–34.0)
MCHC: 35 g/dL (ref 30.0–36.0)
MCV: 85.9 fL (ref 78.0–100.0)

## 2012-01-26 LAB — BASIC METABOLIC PANEL
BUN: 17 mg/dL (ref 6–23)
Calcium: 9.7 mg/dL (ref 8.4–10.5)
Creatinine, Ser: 0.96 mg/dL (ref 0.50–1.35)
GFR calc non Af Amer: 81 mL/min — ABNORMAL LOW (ref >90)
Glucose, Bld: 127 mg/dL — ABNORMAL HIGH (ref 70–99)
Sodium: 137 mEq/L (ref 135–145)

## 2012-01-26 MED ORDER — LEVALBUTEROL TARTRATE 45 MCG/ACT IN AERO
2.0000 | INHALATION_SPRAY | Freq: Four times a day (QID) | RESPIRATORY_TRACT | Status: DC
  Administered 2012-01-26 – 2012-01-28 (×9): 2 via RESPIRATORY_TRACT
  Filled 2012-01-26: qty 15

## 2012-01-26 MED ORDER — GUAIFENESIN ER 600 MG PO TB12
600.0000 mg | ORAL_TABLET | Freq: Two times a day (BID) | ORAL | Status: DC
  Administered 2012-01-26 – 2012-01-28 (×5): 600 mg via ORAL
  Filled 2012-01-26 (×6): qty 1

## 2012-01-26 MED ORDER — DOXYCYCLINE HYCLATE 100 MG PO TABS
100.0000 mg | ORAL_TABLET | Freq: Two times a day (BID) | ORAL | Status: DC
  Administered 2012-01-26 – 2012-01-28 (×5): 100 mg via ORAL
  Filled 2012-01-26 (×6): qty 1

## 2012-01-26 MED ORDER — REGADENOSON 0.4 MG/5ML IV SOLN
0.4000 mg | Freq: Once | INTRAVENOUS | Status: AC
  Administered 2012-01-27: 0.4 mg via INTRAVENOUS
  Filled 2012-01-26: qty 5

## 2012-01-26 NOTE — Progress Notes (Signed)
[facility]  DAILY PROGRESS NOTE   Subjective:  No chest pain or dyspnea. No arrhythmia.  Objective:  Temp:  [97.3 F (36.3 C)-97.6 F (36.4 C)] 97.6 F (36.4 C) (05/12 0435) Pulse Rate:  [52-62] 52  (05/12 0435) Resp:  [18-19] 18  (05/12 0435) BP: (106-143)/(61-80) 143/80 mmHg (05/12 0435) SpO2:  [92 %-93 %] 92 % (05/12 0804) Weight change:   Intake/Output from previous day: 05/11 0701 - 05/12 0700 In: -  Out: 3000 [Urine:3000]  Intake/Output from this shift:    Medications: Current Facility-Administered Medications  Medication Dose Route Frequency Provider Last Rate Last Dose  . 0.9 %  sodium chloride infusion  250 mL Intravenous PRN Ron Parker, MD      . acetaminophen (TYLENOL) tablet 650 mg  650 mg Oral Q6H PRN Ron Parker, MD   650 mg at 01/25/12 0302   Or  . acetaminophen (TYLENOL) suppository 650 mg  650 mg Rectal Q6H PRN Ron Parker, MD      . allopurinol (ZYLOPRIM) tablet 100 mg  100 mg Oral Daily Ron Parker, MD   100 mg at 01/25/12 1032  . aspirin EC tablet 325 mg  325 mg Oral Daily Ron Parker, MD   325 mg at 01/25/12 1032  . aspirin EC tablet 81 mg  81 mg Oral Daily Ron Parker, MD   81 mg at 01/25/12 1033  . baclofen (LIORESAL) tablet 20 mg  20 mg Oral QID Ron Parker, MD   20 mg at 01/25/12 2244  . calcium carbonate (TUMS - dosed in mg elemental calcium) chewable tablet 400 mg of elemental calcium  2 tablet Oral TID Ron Parker, MD   400 mg of elemental calcium at 01/25/12 1722  . cilostazol (PLETAL) tablet 50 mg  50 mg Oral BID Ron Parker, MD   50 mg at 01/25/12 2244  . clopidogrel (PLAVIX) tablet 75 mg  75 mg Oral Q breakfast Ron Parker, MD   75 mg at 01/26/12 0653  . diltiazem (DILACOR XR) 24 hr capsule 120 mg  120 mg Oral Daily Ron Parker, MD   120 mg at 01/25/12 1032  . enoxaparin (LOVENOX) injection 95 mg  1 mg/kg Subcutaneous Q12H Chrystie Nose, MD   95 mg at 01/25/12 2251  . Fluticasone-Salmeterol (ADVAIR) 250-50 MCG/DOSE inhaler 1 puff  1 puff Inhalation Q12H Ron Parker, MD   1 puff at 01/26/12 0804  . furosemide (LASIX) tablet 40 mg  40 mg Oral Daily Ron Parker, MD   40 mg at 01/25/12 1031  . HYDROmorphone (DILAUDID) injection 0.5-1 mg  0.5-1 mg Intravenous Q3H PRN Ron Parker, MD      . l-methylfolate-B6-B12 (METANX) 3-35-2 MG per tablet 1 tablet  1 tablet Oral Daily Antonieta Pert, MD   1 tablet at 01/25/12 1032  . metoprolol tartrate (LOPRESSOR) tablet 25 mg  25 mg Oral Daily Ron Parker, MD   25 mg at 01/25/12 1031  . nitroGLYCERIN (NITROSTAT) SL tablet 0.4 mg  0.4 mg Sublingual Q5 min PRN Laveda Norman, MD      . ondansetron [facility]) tablet 4 mg  4 mg Oral Q6H PRN Ron Parker, MD       Or  . ondansetron (ZOFRAN) injection 4 mg  4 mg Intravenous Q6H PRN Ron Parker, MD      . oxyCODONE (Oxy IR/ROXICODONE) immediate release tablet  5 mg  5 mg Oral Q4H PRN Ron Parker, MD   5 mg at 01/25/12 2014  . pantoprazole (PROTONIX) EC tablet 40 mg  40 mg Oral Q1200 Ron Parker, MD   40 mg at 01/25/12 1136  . polyethylene glycol (MIRALAX / GLYCOLAX) packet 17 g  17 g Oral Daily Laveda Norman, MD   17 g at 01/25/12 1329  . potassium chloride SA (K-DUR,KLOR-CON) CR tablet 40 mEq  40 mEq Oral Daily Laveda Norman, MD   40 mEq at 01/25/12 1329  . ramipril (ALTACE) capsule 2.5 mg  2.5 mg Oral Daily Ron Parker, MD   2.5 mg at 01/25/12 1030  . regadenoson (LEXISCAN) injection SOLN 0.4 mg  0.4 mg Intravenous Once Pratyush Ammon, MD      . simvastatin (ZOCOR) tablet 20 mg  20 mg Oral QPM Ron Parker, MD   20 mg at 01/25/12 1723  . sodium chloride 0.9 % injection 3 mL  3 mL Intravenous Q12H Ron Parker, MD   3 mL at 01/25/12 2246  . sodium chloride 0.9 % injection 3 mL  3 mL Intravenous PRN Ron Parker, MD      . tiZANidine (ZANAFLEX) tablet 4 mg  4 mg Oral Q6H  Ron Parker, MD   4 mg at 01/26/12 0458  . traZODone (DESYREL) tablet 100 mg  100 mg Oral QHS Ron Parker, MD   100 mg at 01/25/12 2244  . zolpidem (AMBIEN) tablet 5 mg  5 mg Oral QHS PRN Ron Parker, MD      . DISCONTD: enoxaparin (LOVENOX) injection 40 mg  40 mg Subcutaneous Q24H Ron Parker, MD   40 mg at 01/25/12 1033    Physical Exam: General appearance: alert and no distress  Neck: no adenopathy, no carotid bruit, no JVD, supple, symmetrical, trachea midline and thyroid not enlarged, symmetric, no tenderness/mass/nodules  Lungs: diminished breath sounds bilaterally and rhonchi bilaterally  Heart: irregularly irregular rhythm  Abdomen: soft, non-tender; bowel sounds normal; no masses, no organomegaly  Extremities: extremities normal, atraumatic, no cyanosis or edema  Pulses: 1+ pulses  Skin: pale, dry  Neurologic: Mental status: Alert, oriented, thought content appropriate   Lab Results: Results for orders placed during the hospital encounter of 01/24/12 (from the past 48 hour(s))  CARDIAC PANEL(CRET KIN+CKTOT+MB+TROPI)     Status: Normal   Collection Time   01/24/12 11:15 PM      Component Value Range Comment   Total CK 230  7 - 232 (U/L)    CK, MB 1.7  0.3 - 4.0 (ng/mL)    Troponin I <0.30  <0.30 (ng/mL)    Relative Index 0.7  0.0 - 2.5    BASIC METABOLIC PANEL     Status: Abnormal   Collection Time   01/24/12 11:15 PM      Component Value Range Comment   Sodium 138  135 - 145 (mEq/L)    Potassium 3.7  3.5 - 5.1 (mEq/L)    Chloride 95 (*) 96 - 112 (mEq/L)    CO2 32  19 - 32 (mEq/L)    Glucose, Bld 124 (*) 70 - 99 (mg/dL)    BUN 21  6 - 23 (mg/dL)    Creatinine, Ser 1.61  0.50 - 1.35 (mg/dL)    Calcium 9.3  8.4 - 10.5 (mg/dL)    GFR calc non Af Amer 53 (*) >90 (mL/min)    GFR calc Af Amer 62 (*) >  90 (mL/min)   CBC     Status: Abnormal   Collection Time   01/24/12 11:15 PM      Component Value Range Comment   WBC 12.5 (*) 4.0 - 10.5 (K/uL)      RBC 5.21  4.22 - 5.81 (MIL/uL)    Hemoglobin 15.8  13.0 - 17.0 (g/dL)    HCT 16.1  09.6 - 04.5 (%)    MCV 86.2  78.0 - 100.0 (fL)    MCH 30.3  26.0 - 34.0 (pg)    MCHC 35.2  30.0 - 36.0 (g/dL)    RDW 40.9  81.1 - 91.4 (%)    Platelets 263  150 - 400 (K/uL)   PROTIME-INR     Status: Normal   Collection Time   01/24/12 11:15 PM      Component Value Range Comment   Prothrombin Time 12.9  11.6 - 15.2 (seconds)    INR 0.95  0.00 - 1.49    BASIC METABOLIC PANEL     Status: Abnormal   Collection Time   01/25/12  7:30 AM      Component Value Range Comment   Sodium 138  135 - 145 (mEq/L)    Potassium 3.3 (*) 3.5 - 5.1 (mEq/L)    Chloride 95 (*) 96 - 112 (mEq/L)    CO2 30  19 - 32 (mEq/L)    Glucose, Bld 127 (*) 70 - 99 (mg/dL)    BUN 21  6 - 23 (mg/dL)    Creatinine, Ser 7.82  0.50 - 1.35 (mg/dL)    Calcium 9.0  8.4 - 10.5 (mg/dL)    GFR calc non Af Amer 64 (*) >90 (mL/min)    GFR calc Af Amer 74 (*) >90 (mL/min)   CBC     Status: Abnormal   Collection Time   01/25/12  7:30 AM      Component Value Range Comment   WBC 12.3 (*) 4.0 - 10.5 (K/uL)    RBC 4.87  4.22 - 5.81 (MIL/uL)    Hemoglobin 14.3  13.0 - 17.0 (g/dL)    HCT 95.6  21.3 - 08.6 (%)    MCV 86.0  78.0 - 100.0 (fL)    MCH 29.4  26.0 - 34.0 (pg)    MCHC 34.1  30.0 - 36.0 (g/dL)    RDW 57.8  46.9 - 62.9 (%)    Platelets 235  150 - 400 (K/uL)   CARDIAC PANEL(CRET KIN+CKTOT+MB+TROPI)     Status: Abnormal   Collection Time   01/25/12  2:52 PM      Component Value Range Comment   Total CK 451 (*) 7 - 232 (U/L)    CK, MB 2.1  0.3 - 4.0 (ng/mL)    Troponin I <0.30  <0.30 (ng/mL)    Relative Index 0.5  0.0 - 2.5    CARDIAC PANEL(CRET KIN+CKTOT+MB+TROPI)     Status: Abnormal   Collection Time   01/25/12  9:48 PM      Component Value Range Comment   Total CK 454 (*) 7 - 232 (U/L)    CK, MB 1.9  0.3 - 4.0 (ng/mL)    Troponin I <0.30  <0.30 (ng/mL)    Relative Index 0.4  0.0 - 2.5    CBC     Status: Abnormal   Collection  Time   01/26/12  5:20 AM      Component Value Range Comment   WBC 12.7 (*) 4.0 - 10.5 (K/uL)  RBC 5.02  4.22 - 5.81 (MIL/uL)    Hemoglobin 15.1  13.0 - 17.0 (g/dL)    HCT 16.1  09.6 - 04.5 (%)    MCV 85.9  78.0 - 100.0 (fL)    MCH 30.1  26.0 - 34.0 (pg)    MCHC 35.0  30.0 - 36.0 (g/dL)    RDW 40.9  81.1 - 91.4 (%)    Platelets 246  150 - 400 (K/uL)   BASIC METABOLIC PANEL     Status: Abnormal   Collection Time   01/26/12  5:20 AM      Component Value Range Comment   Sodium 137  135 - 145 (mEq/L)    Potassium 3.7  3.5 - 5.1 (mEq/L)    Chloride 95 (*) 96 - 112 (mEq/L)    CO2 30  19 - 32 (mEq/L)    Glucose, Bld 127 (*) 70 - 99 (mg/dL)    BUN 17  6 - 23 (mg/dL)    Creatinine, Ser 7.82  0.50 - 1.35 (mg/dL)    Calcium 9.7  8.4 - 10.5 (mg/dL)    GFR calc non Af Amer 81 (*) >90 (mL/min)    GFR calc Af Amer >90  >90 (mL/min)     Imaging: Imaging results have been reviewed and Dg Chest 2 View  01/25/2012  *RADIOLOGY REPORT*  Clinical Data: Chest pain, shortness of breath, chronic bronchitis, COPD.  Smoker.  CHEST - 2 VIEW  Comparison: 05/14/2010  Findings: The right costophrenic angle is excluded from the field of view.  Motion artifact.  Mild cardiac enlargement with normal pulmonary vascularity.  No focal airspace consolidation.  No pneumothorax.  Emphysematous changes in the lungs.  Degenerative changes in the thoracic spine.  IMPRESSION: Technically limited study.  No evidence of active pulmonary disease.  Original Report Authenticated By: Marlon Pel, M.D.    Assessment:  1. Principal Problem: 2.  *Chest pain 3. Active Problems: 4.  DIABETES, TYPE 2 5.  CAD 6.  Atrial fibrillation 7.  PERIPHERAL VASCULAR DISEASE 8.  C O P D 9.  RENAL INSUFFICIENCY 10.  Peripheral neuropathy 11.   Plan:  1. Atypical chest pain in a patient with known moderate CAD since 2004 and extensive PAD. Negative enzymes and non-diagnostic ECG changes. Functional status is limited by severe  lower extremity neuropathy. 2. Steffanie Dunn in AM. If abnormal, cardiac cath on Tuesday. If normal, DC home after nuke. He prefers follow-up in our [place] office.  Time Spent Directly with Patient:  20 minutes  Length of Stay:  LOS: 2 days    Vila Dory 01/26/2012, 9:13 AM

## 2012-01-26 NOTE — Progress Notes (Signed)
Subjective: No further chest pain. Has cough with greenish phlegm x one week.  Objective: Vital signs in last 24 hours: Temp:  [97.3 F (36.3 C)-97.6 F (36.4 C)] 97.6 F (36.4 C) (05/12 0435) Pulse Rate:  [52-61] 61  (05/12 1041) Resp:  [18-19] 18  (05/12 0435) BP: (106-143)/(61-80) 138/72 mmHg (05/12 1041) SpO2:  [92 %-93 %] 92 % (05/12 0804) Weight change:     Intake/Output from previous day: 05/11 0701 - 05/12 0700 In: -  Out: 3000 [Urine:3000]     Physical Exam: General: Comfortable, alert, communicative, fully oriented, not short of breath at rest.  HEENT:  No clinical pallor, no jaundice, no conjunctival injection or discharge. Hydration status is fair. NECK:  Supple, JVP not seen, no carotid bruits, no palpable lymphadenopathy, no palpable goiter. CHEST:  Clinically clear to auscultation, few expiratory rhonchi, no crackles. HEART:  Sounds 1 and 2 heard, normal, regular, no murmurs. ABDOMEN:  Difficult to properly examine, as patient is sitting, but soft Full, soft, moderately obese, non-tender, Normal bowel sounds. GENITALIA:  Not examined.. LOWER EXTREMITIES:  No pitting edema, palpable peripheral pulses. MUSCULOSKELETAL SYSTEM:  Generalized osteoarthritic changes, otherwise, normal. CENTRAL NERVOUS SYSTEM:  No focal neurologic deficit on gross examination.  Lab Results:  Basename 01/26/12 0520 01/25/12 0730  WBC 12.7* 12.3*  HGB 15.1 14.3  HCT 43.1 41.9  PLT 246 235    Basename 01/26/12 0520 01/25/12 0730  NA 137 138  K 3.7 3.3*  CL 95* 95*  CO2 30 30  GLUCOSE 127* 127*  BUN 17 21  CREATININE 0.96 1.12  CALCIUM 9.7 9.0   No results found for this or any previous visit (from the past 240 hour(s)).   Studies/Results: Dg Chest 2 View  01/25/2012  *RADIOLOGY REPORT*  Clinical Data: Chest pain, shortness of breath, chronic bronchitis, COPD.  Smoker.  CHEST - 2 VIEW  Comparison: 05/14/2010  Findings: The right costophrenic angle is excluded from the  field of view.  Motion artifact.  Mild cardiac enlargement with normal pulmonary vascularity.  No focal airspace consolidation.  No pneumothorax.  Emphysematous changes in the lungs.  Degenerative changes in the thoracic spine.  IMPRESSION: Technically limited study.  No evidence of active pulmonary disease.  Original Report Authenticated By: Marlon Pel, M.D.    Medications: Scheduled Meds:    . allopurinol  100 mg Oral Daily  . aspirin EC  325 mg Oral Daily  . aspirin EC  81 mg Oral Daily  . baclofen  20 mg Oral QID  . calcium carbonate  2 tablet Oral TID  . cilostazol  50 mg Oral BID  . clopidogrel  75 mg Oral Q breakfast  . diltiazem  120 mg Oral Daily  . enoxaparin  1 mg/kg Subcutaneous Q12H  . Fluticasone-Salmeterol  1 puff Inhalation Q12H  . furosemide  40 mg Oral Daily  . l-methylfolate-B6-B12  1 tablet Oral Daily  . metoprolol  25 mg Oral Daily  . pantoprazole  40 mg Oral Q1200  . polyethylene glycol  17 g Oral Daily  . potassium chloride  40 mEq Oral Daily  . ramipril  2.5 mg Oral Daily  . regadenoson  0.4 mg Intravenous Once  . simvastatin  20 mg Oral QPM  . sodium chloride  3 mL Intravenous Q12H  . tiZANidine  4 mg Oral Q6H  . traZODone  100 mg Oral QHS  . DISCONTD: enoxaparin  40 mg Subcutaneous Q24H   Continuous Infusions:  PRN Meds:.sodium chloride,  acetaminophen, acetaminophen, HYDROmorphone (DILAUDID) injection, nitroGLYCERIN, ondansetron (ZOFRAN) IV, ondansetron, oxyCODONE, sodium chloride, zolpidem  Assessment/Plan:  Principal Problem:  *Chest pain: Patient presented with recurrent episodes of chest pain. At least 3 episodes, since 9:00 PM on 5/10./13. He was given 4 tabs of ASA, en route to the ED, and pain was relieved by SL NTG. He has had no further episodes since, EKG shows SR, without acute ischemic changes and cardiac enzymes are negative. Patient has significant risk factors, including age, gender, DM, PVD and very strong family history of CAD.  Stratification is needed, and cardiology consultation was provided by Dr Rennis Golden, [facility]. Antiplatelet therapy continues, and stress Myoview has been scheduled for 01/27/12. Active Problems:  1. DIABETES, TYPE 2: Per patient, this is diet controlled. CBG is normal at this time. HBA1C is pending..  2. Atrial fibrillation: This is paroxysmal, as patient is currently in SR and rate-controlled. Clinically, he has no overt evidence of CHF. Pre-admission Cardizem and beta-blocker have been continued. Patient is on diuretics and ACE-i.  3. PERIPHERAL VASCULAR DISEASE: Clinically stable at this time.  4. COPD: Stable/asymptomatic. Unfortunately, patient continues to smoke.  5. Chronic back pain: Appears adequately pain-controlled at this time.  6. Tobacco abuse: Patient has been counseled appropriately.  7. Acute bronchitis: Patient states today, that he has had a productive cough with green phlegm, for about a week. We shall commence Doxycyxline, Mucinex and inhalers. (Patient is allergic to quinolones, PCN and Sulfa).  Comment: Disposition, per Cardiology.    LOS: 2 days   Chad Morse,CHRISTOPHER 01/26/2012, 12:05 PM

## 2012-01-27 ENCOUNTER — Observation Stay (HOSPITAL_COMMUNITY): Payer: Medicare Other

## 2012-01-27 DIAGNOSIS — I4891 Unspecified atrial fibrillation: Secondary | ICD-10-CM

## 2012-01-27 DIAGNOSIS — R079 Chest pain, unspecified: Secondary | ICD-10-CM

## 2012-01-27 DIAGNOSIS — E1165 Type 2 diabetes mellitus with hyperglycemia: Secondary | ICD-10-CM

## 2012-01-27 DIAGNOSIS — J449 Chronic obstructive pulmonary disease, unspecified: Secondary | ICD-10-CM | POA: Diagnosis present

## 2012-01-27 DIAGNOSIS — I10 Essential (primary) hypertension: Secondary | ICD-10-CM

## 2012-01-27 LAB — BASIC METABOLIC PANEL
Calcium: 9.4 mg/dL (ref 8.4–10.5)
Creatinine, Ser: 1.01 mg/dL (ref 0.50–1.35)
GFR calc Af Amer: 84 mL/min — ABNORMAL LOW (ref >90)
GFR calc non Af Amer: 72 mL/min — ABNORMAL LOW (ref >90)
Sodium: 133 mEq/L — ABNORMAL LOW (ref 135–145)

## 2012-01-27 LAB — CBC
MCH: 29.9 pg (ref 26.0–34.0)
MCHC: 34.7 g/dL (ref 30.0–36.0)
Platelets: 235 10*3/uL (ref 150–400)
RBC: 4.98 MIL/uL (ref 4.22–5.81)
RDW: 12.7 % (ref 11.5–15.5)

## 2012-01-27 MED ORDER — GUAIFENESIN ER 600 MG PO TB12: 600.0000 mg | ORAL_TABLET | Freq: Two times a day (BID) | ORAL | Status: DC

## 2012-01-27 MED ORDER — TECHNETIUM TC 99M TETROFOSMIN IV KIT
30.0000 | PACK | Freq: Once | INTRAVENOUS | Status: AC | PRN
  Administered 2012-01-27: 30 via INTRAVENOUS

## 2012-01-27 MED ORDER — TECHNETIUM TC 99M TETROFOSMIN IV KIT
10.0000 | PACK | Freq: Once | INTRAVENOUS | Status: AC | PRN
  Administered 2012-01-27: 10 via INTRAVENOUS

## 2012-01-27 MED ORDER — RAMIPRIL 5 MG PO CAPS
5.0000 mg | ORAL_CAPSULE | Freq: Every day | ORAL | Status: DC
  Administered 2012-01-28: 5 mg via ORAL
  Filled 2012-01-27: qty 1

## 2012-01-27 MED ORDER — CARVEDILOL 6.25 MG PO TABS
6.2500 mg | ORAL_TABLET | Freq: Two times a day (BID) | ORAL | Status: DC
  Administered 2012-01-27 – 2012-01-28 (×3): 6.25 mg via ORAL
  Filled 2012-01-27 (×4): qty 1

## 2012-01-27 MED ORDER — DOXYCYCLINE HYCLATE 100 MG PO TABS: 100.0000 mg | ORAL_TABLET | Freq: Two times a day (BID) | ORAL | Status: AC

## 2012-01-27 NOTE — Consult Note (Signed)
Pt states he smokes 5 cigarettes per day but that he doesn't want to quit and enjoys the. Discussed in detail risk factors of continued smoking on his health. Pt vebalizes understanding and is in precontemplation stage. Referred to 1-800 quit now for f/u and support. Discussed oral fixation substitutes, second hand smoke and in home smoking policy. Reviewed and gave pt Written education/contact information.

## 2012-01-27 NOTE — Progress Notes (Signed)
Comment: Discussed with Dr Rennis Golden. Discharge deferred today, for further cardiology work up and optimization of cardiac management. [facility] has kindly taken over care.  C. Farmer Mccahill. MD.

## 2012-01-27 NOTE — Progress Notes (Signed)
Discussed Myoview results with Dr Rennis Golden. New low EF. Will check 2D and evaluate further tomorrow. He is not a great candidate for cath.  Corine Shelter PA-C 01/27/2012 5:10 PM

## 2012-01-27 NOTE — Progress Notes (Signed)
Myoview today. Will discuss results with patient when available. If negative, can likely be discharged home later today.  Chrystie Nose, MD, [facility] Attending Cardiologist [facility]

## 2012-01-27 NOTE — Progress Notes (Signed)
Subjective:  Cough, pain from neuropathy in his legs  Objective:  Vital Signs in the last 24 hours: Temp:  [97.6 F (36.4 C)-98.5 F (36.9 C)] 98.5 F (36.9 C) (05/13 0444) Pulse Rate:  [55-94] 55  (05/13 0444) Resp:  [18-20] 18  (05/13 0444) BP: (120-133)/(62-69) 133/62 mmHg (05/13 0444) SpO2:  [90 %-96 %] 95 % (05/13 0818)  Intake/Output from previous day:  Intake/Output Summary (Last 24 hours) at 01/27/12 1056 Last data filed at 01/27/12 0857  Gross per 24 hour  Intake    240 ml  Output   2226 ml  Net  -1986 ml    Physical Exam: General appearance: alert, cooperative and no distress Lungs: decreased breath sounds Heart: regular rate and rhythm   Rate: 90  Rhythm: normal sinus rhythm  Lab Results:  Basename 01/27/12 0640 01/26/12 0520  WBC 14.0* 12.7*  HGB 14.9 15.1  PLT 235 246    Basename 01/27/12 0640 01/26/12 0520  NA 133* 137  K 4.4 3.7  CL 95* 95*  CO2 28 30  GLUCOSE 131* 127*  BUN 16 17  CREATININE 1.01 0.96    Basename 01/25/12 2148 01/25/12 1452  TROPONINI <0.30 <0.30   Hepatic Function Panel No results found for this basename: PROT,ALBUMIN,AST,ALT,ALKPHOS,BILITOT,BILIDIR,IBILI in the last 72 hours No results found for this basename: CHOL in the last 72 hours  Basename 01/24/12 2315  INR 0.95    Imaging: Imaging results have been reviewed  Cardiac Studies:  Assessment/Plan:   Principal Problem:  *Chest pain Active Problems:  CAD  RENAL INSUFFICIENCY  Bronchitis, acute, with chronic airway obstruction  DIABETES, TYPE 2  Atrial fibrillation  PERIPHERAL VASCULAR DISEASE  C O P D  Peripheral neuropathy  Plan- Myoview today, ABs started for bronchitis    Corine Shelter PA-C 01/27/2012, 10:56 AM

## 2012-01-27 NOTE — Consult Note (Signed)
Pt in nuclear medicine at this time  

## 2012-01-27 NOTE — Progress Notes (Addendum)
Discussed results of Myoview with Dr. Rennis Golden.  [facility] will take the patient onto our service.  Abnormal Myoview with ? Fixed inferior defect & decreased EF -- not sure if this is old or new.    Will need to check 2 D Echo to better assess EF & Wall motion.  Will convert BB to Carvedilol & increase ACE-I dose.  Does not appear to be in acute CHF.   Chrystie Nose, MD, [facility] Attending Cardiologist [facility]  Marykay Lex, M.D., M.S. [facility] [address] [place], [place]  [address]  [phone] Pager # [phone]  01/27/2012 5:52 PM

## 2012-01-27 NOTE — Discharge Summary (Addendum)
Physician Discharge Summary  Patient ID: Chad Morse MRN: [id number] DOB/AGE: [birth year] 71 y.o.  Admit date: 01/24/2012 Discharge date: 01/27/2012  Primary Care Physician:  Fredirick Maudlin, MD, MD Primary Cardiologist, Dr Gerlene Burdock [place].  Discharge Diagnoses:    Patient Active Problem List  Diagnoses  . DIABETES, TYPE 2  . CAD  . Atrial fibrillation  . PERIPHERAL VASCULAR DISEASE  . C O P D  . PULMONARY NODULE, RIGHT LOWER LOBE  . RENAL INSUFFICIENCY  . Atrial fibrillation  . Peripheral neuropathy  . Gallstones  . COPD (chronic obstructive pulmonary disease)  . Peripheral vascular disease  . Chest pain  . Bronchitis, acute, with chronic airway obstruction    Medication List  As of 01/27/2012  5:33 PM   TAKE these medications         allopurinol 100 MG tablet   Commonly known as: ZYLOPRIM   Take 100 mg by mouth daily.      aspirin EC 81 MG tablet   Take 81 mg by mouth daily.      baclofen 20 MG tablet   Commonly known as: LIORESAL   Take 20 mg by mouth 4 (four) times daily.      calcium carbonate 500 MG chewable tablet   Commonly known as: TUMS - dosed in mg elemental calcium   Chew 2 tablets by mouth 3 (three) times daily as needed. For indigestion      cilostazol 50 MG tablet   Commonly known as: PLETAL   Take 50 mg by mouth 2 (two) times daily.      clopidogrel 75 MG tablet   Commonly known as: PLAVIX   Take 75 mg by mouth daily.      diltiazem 120 MG 24 hr capsule   Commonly known as: DILACOR XR   Take 120 mg by mouth daily.      doxycycline 100 MG tablet   Commonly known as: VIBRA-TABS   Take 1 tablet (100 mg total) by mouth every 12 (twelve) hours.      Fluticasone-Salmeterol 250-50 MCG/DOSE Aepb   Commonly known as: ADVAIR   Inhale 1 puff into the lungs every 12 (twelve) hours.      Folic Acid-Vit B6-Vit B12 2.5-25-1 MG Tabs   Commonly known as: FOLBEE   Take 1 tablet by mouth daily.      furosemide 40 MG tablet   Commonly known  as: LASIX   Take 40 mg by mouth daily.      guaiFENesin 600 MG 12 hr tablet   Commonly known as: MUCINEX   Take 1 tablet (600 mg total) by mouth 2 (two) times daily.      HYDROcodone-acetaminophen 10-325 MG per tablet   Commonly known as: NORCO   Take 1 tablet by mouth 2 (two) times daily as needed. For pain      magnesium hydroxide 311 MG Chew   Commonly known as: PHILLIPS CHEWS   Chew 311 mg by mouth every 4 (four) hours as needed. For indigestion      metoprolol 50 MG tablet   Commonly known as: LOPRESSOR   Take 25 mg by mouth daily.      naproxen sodium 220 MG tablet   Commonly known as: ANAPROX   Take 220 mg by mouth 2 (two) times daily as needed. For leg pain      pantoprazole 40 MG tablet   Commonly known as: PROTONIX   Take 40 mg by mouth daily.  ramipril 2.5 MG capsule   Commonly known as: ALTACE   Take 2.5 mg by mouth daily.      simvastatin 20 MG tablet   Commonly known as: ZOCOR   Take 20 mg by mouth every evening.      tiZANidine 4 MG tablet   Commonly known as: ZANAFLEX   Take 4 mg by mouth every 6 (six) hours.      traZODone 50 MG tablet   Commonly known as: DESYREL   Take 100 mg by mouth at bedtime.      zolpidem 12.5 MG CR tablet   Commonly known as: AMBIEN CR   Take 12.5 mg by mouth at bedtime. For sleep             Disposition and Follow-up:  Follow up with primary MD and primary cardiologist.  Consultation: Dr Rennis Golden, Cardiologist.   Significant Diagnostic Studies:  Dg Chest 2 View  01/25/2012  *RADIOLOGY REPORT*  Clinical Data: Chest pain, shortness of breath, chronic bronchitis, COPD.  Smoker.  CHEST - 2 VIEW  Comparison: 05/14/2010  Findings: The right costophrenic angle is excluded from the field of view.  Motion artifact.  Mild cardiac enlargement with normal pulmonary vascularity.  No focal airspace consolidation.  No pneumothorax.  Emphysematous changes in the lungs.  Degenerative changes in the thoracic spine.  IMPRESSION:  Technically limited study.  No evidence of active pulmonary disease.  Original Report Authenticated By: Marlon Pel, M.D.    Brief H and P: For complete details, refer to admission H and P. However, in brief, this is a 71 year old male with known history of COPD, smoker, atrial fibrillation, DM 2, peripheral neuropathy, PVD, presenting to the ED, following several episodes of chest pain. He was admitted for further evaluation, investigation and management.  Physical Exam: On 01/27/2012. General: Comfortable, alert, communicative, fully oriented, not short of breath at rest.  HEENT: No clinical pallor, no jaundice, no conjunctival injection or discharge. Hydration status is fair.  NECK: Supple, JVP not seen, no carotid bruits, no palpable lymphadenopathy, no palpable goiter.  CHEST: Clinically clear to auscultation, no wheezes, no crackles.  HEART: Sounds 1 and 2 heard, normal, regular, no murmurs.  ABDOMEN: Difficult to properly examine, as patient is sitting, but soft Full, soft, moderately obese, non-tender, Normal bowel sounds.  GENITALIA: Not examined..  LOWER EXTREMITIES: No pitting edema, palpable peripheral pulses.  MUSCULOSKELETAL SYSTEM: Generalized osteoarthritic changes, otherwise, normal.  CENTRAL NERVOUS SYSTEM: No focal neurologic deficit on gross examination.   Hospital Course:  Principal Problem:  *Chest pain: Patient presented with recurrent episodes of chest pain. At least 3 episodes, since 9:00 PM on 5/10./13. He was given 4 tabs of ASA, en route to the ED, and pain was relieved by SL NTG. During this hospitalization, he has had no further episodes, EKG showed SR, without acute ischemic changes and cardiac enzymes remained unelevated. Patient has significant risk factors, including age, gender, DM, PVD and very strong family history of CAD. Cardiology consultation was therefore, provided by Dr Rennis Golden, [facility]. 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 No evidence of ischemia. Ejection fraction 35%. Patient has been reassured accordingly. Active Problems:  1. DIABETES, TYPE 2: Per patient, this is diet controlled. CBG remained normal during hospitalization.  2. Atrial fibrillation: This is paroxysmal, as patient is currently in SR and rate-controlled. Clinically, he had no overt evidence of CHF. Pre-admission  Cardizem and beta-blocker were continued. Patient is on diuretics and ACE-i.  3. PERIPHERAL VASCULAR DISEASE: Clinically stable at this time.  4. COPD: Stable/asymptomatic. Unfortunately, patient continues to smoke.  5. Chronic back pain: Appears adequately pain-controlled at this time.  6. Tobacco abuse: Patient has been counseled appropriately, and appears determined to quit.  7. Acute bronchitis: Patient reportedly, has had a productive cough with green phlegm, for about a week, prior to admission. He was commenced on a 7-day course of Doxycyxline and Mucinex (Patient is allergic to quinolones, PCN and Sulfa).    Comment: Stable for discharge on 01/27/12.  Time spent on Discharge: 35 mins.  Signed: Shyna Duignan,CHRISTOPHER 01/27/2012, 5:33 PM

## 2012-01-28 NOTE — Progress Notes (Signed)
ANTICOAGULATION CONSULT NOTE - Follow Up Consult  Pharmacy Consult for lovenox Indication: CP  Allergies  Allergen Reactions  . Ciprofloxacin Hives  . Penicillins Other (See Comments)    REACTION: hives  . Sulfonamide Derivatives Other (See Comments)    REACTION: hives    Patient Measurements: Height: 6' (182.9 cm) Weight: 211 lb 6.7 oz (95.9 kg) IBW/kg (Calculated) : 77.6    Vital Signs: Temp: 98.2 F (36.8 C) (05/14 0603) Temp src: Oral (05/14 0603) BP: 112/56 mmHg (05/14 0603) Pulse Rate: 66  (05/14 0640)  Labs:  Basename 01/27/12 0640 01/26/12 0520 01/25/12 2148 01/25/12 1452  HGB 14.9 15.1 -- --  HCT 42.9 43.1 -- --  PLT 235 246 -- --  APTT -- -- -- --  LABPROT -- -- -- --  INR -- -- -- --  HEPARINUNFRC -- -- -- --  CREATININE 1.01 0.96 -- --  CKTOTAL -- -- 454* 451*  CKMB -- -- 1.9 2.1  TROPONINI -- -- <0.30 <0.30    Estimated Creatinine Clearance: 79.4 ml/min (by C-G formula based on Cr of 1.01).   Medications:  Scheduled:    . allopurinol  100 mg Oral Daily  . aspirin EC  81 mg Oral Daily  . baclofen  20 mg Oral QID  . calcium carbonate  2 tablet Oral TID  . carvedilol  6.25 mg Oral BID WC  . cilostazol  50 mg Oral BID  . clopidogrel  75 mg Oral Q breakfast  . diltiazem  120 mg Oral Daily  . doxycycline  100 mg Oral Q12H  . enoxaparin  1 mg/kg Subcutaneous Q12H  . Fluticasone-Salmeterol  1 puff Inhalation Q12H  . furosemide  40 mg Oral Daily  . guaiFENesin  600 mg Oral BID  . l-methylfolate-B6-B12  1 tablet Oral Daily  . levalbuterol  2 puff Inhalation QID  . pantoprazole  40 mg Oral Q1200  . polyethylene glycol  17 g Oral Daily  . potassium chloride  40 mEq Oral Daily  . ramipril  5 mg Oral Daily  . regadenoson  0.4 mg Intravenous Once  . simvastatin  20 mg Oral QPM  . sodium chloride  3 mL Intravenous Q12H  . tiZANidine  4 mg Oral Q6H  . traZODone  100 mg Oral QHS  . DISCONTD: aspirin EC  325 mg Oral Daily  . DISCONTD: metoprolol  25  mg Oral Daily  . DISCONTD: ramipril  2.5 mg Oral Daily    Assessment: 71 yo male here with CP on lovenox now s/p myoview w/ ? fixed inferior defect & decreased EF   Goal of Therapy:  Monitor platelets by anticoagulation protocol: Yes   Plan:  -No lovenox dose changes needed -CBC every three days  Harland German, Pharm D 01/28/2012 9:18 AM

## 2012-01-28 NOTE — Progress Notes (Signed)
[facility]  Subjective: No CP, SOB or orthopnea.  Objective: Vital signs in last 24 hours: Temp:  [98.2 F (36.8 C)-98.7 F (37.1 C)] 98.2 F (36.8 C) (05/14 0603) Pulse Rate:  [55-78] 66  (05/14 0640) Resp:  [18-22] 18  (05/14 0603) BP: (112-178)/(56-70) 112/56 mmHg (05/14 0603) SpO2:  [90 %-99 %] 95 % (05/14 1130) Last BM Date: 01/27/12  Intake/Output from previous day: 05/13 0701 - 05/14 0700 In: 720 [P.O.:720] Out: 2525 [Urine:2525] Intake/Output this shift: Total I/O In: 363 [P.O.:360; I.V.:3] Out: -   Medications Current Facility-Administered Medications  Medication Dose Route Frequency Provider Last Rate Last Dose  . 0.9 %  sodium chloride infusion  250 mL Intravenous PRN Ron Parker, MD      . acetaminophen (TYLENOL) tablet 650 mg  650 mg Oral Q6H PRN Ron Parker, MD   650 mg at 01/25/12 0302   Or  . acetaminophen (TYLENOL) suppository 650 mg  650 mg Rectal Q6H PRN Ron Parker, MD      . allopurinol (ZYLOPRIM) tablet 100 mg  100 mg Oral Daily Ron Parker, MD   100 mg at 01/28/12 1040  . aspirin EC tablet 81 mg  81 mg Oral Daily Ron Parker, MD   81 mg at 01/28/12 1039  . baclofen (LIORESAL) tablet 20 mg  20 mg Oral QID Ron Parker, MD   20 mg at 01/28/12 1039  . calcium carbonate (TUMS - dosed in mg elemental calcium) chewable tablet 400 mg of elemental calcium  2 tablet Oral TID Ron Parker, MD   400 mg of elemental calcium at 01/25/12 1722  . carvedilol (COREG) tablet 6.25 mg  6.25 mg Oral BID WC Marykay Lex, MD   6.25 mg at 01/28/12 0644  . cilostazol (PLETAL) tablet 50 mg  50 mg Oral BID Ron Parker, MD   50 mg at 01/28/12 1040  . clopidogrel (PLAVIX) tablet 75 mg  75 mg Oral Q breakfast Ron Parker, MD   75 mg at 01/28/12 [phone]  . diltiazem (DILACOR XR) 24 hr capsule 120 mg  120 mg Oral Daily Ron Parker, MD   120 mg at 01/28/12 1038  . doxycycline  (VIBRA-TABS) tablet 100 mg  100 mg Oral Q12H Laveda Norman, MD   100 mg at 01/28/12 1038  . enoxaparin (LOVENOX) injection 95 mg  1 mg/kg Subcutaneous Q12H Chrystie Nose, MD   95 mg at 01/28/12 1037  . Fluticasone-Salmeterol (ADVAIR) 250-50 MCG/DOSE inhaler 1 puff  1 puff Inhalation Q12H Ron Parker, MD   1 puff at 01/28/12 0754  . furosemide (LASIX) tablet 40 mg  40 mg Oral Daily Ron Parker, MD   40 mg at 01/28/12 1038  . guaiFENesin (MUCINEX) 12 hr tablet 600 mg  600 mg Oral BID Laveda Norman, MD   600 mg at 01/28/12 1038  . HYDROmorphone (DILAUDID) injection 0.5-1 mg  0.5-1 mg Intravenous Q3H PRN Ron Parker, MD      . l-methylfolate-B6-B12 (METANX) 3-35-2 MG per tablet 1 tablet  1 tablet Oral Daily Antonieta Pert, MD   1 tablet at 01/28/12 1038  . levalbuterol (XOPENEX HFA) inhaler 2 puff  2 puff Inhalation QID Laveda Norman, MD   2 puff at 01/28/12 1130  . nitroGLYCERIN (NITROSTAT) SL tablet 0.4 mg  0.4 mg Sublingual Q5 min PRN Laveda Norman, MD      .  ondansetron (ZOFRAN) tablet 4 mg  4 mg Oral Q6H PRN Ron Parker, MD       Or  . ondansetron (ZOFRAN) injection 4 mg  4 mg Intravenous Q6H PRN Ron Parker, MD      . oxyCODONE (Oxy IR/ROXICODONE) immediate release tablet 5 mg  5 mg Oral Q4H PRN Ron Parker, MD   5 mg at 01/27/12 2122  . pantoprazole (PROTONIX) EC tablet 40 mg  40 mg Oral Q1200 Ron Parker, MD   40 mg at 01/27/12 1301  . polyethylene glycol (MIRALAX / GLYCOLAX) packet 17 g  17 g Oral Daily Laveda Norman, MD   17 g at 01/28/12 1036  . potassium chloride SA (K-DUR,KLOR-CON) CR tablet 40 mEq  40 mEq Oral Daily Laveda Norman, MD   40 mEq at 01/28/12 1039  . ramipril (ALTACE) capsule 5 mg  5 mg Oral Daily Marykay Lex, MD   5 mg at 01/28/12 1040  . simvastatin (ZOCOR) tablet 20 mg  20 mg Oral QPM Ron Parker, MD   20 mg at 01/27/12 1656  . sodium chloride 0.9 % injection 3 mL  3 mL Intravenous Q12H Ron Parker, MD   3 mL at  01/28/12 1041  . sodium chloride 0.9 % injection 3 mL  3 mL Intravenous PRN Ron Parker, MD      . tiZANidine (ZANAFLEX) tablet 4 mg  4 mg Oral Q6H Ron Parker, MD   4 mg at 01/28/12 1041  . traZODone (DESYREL) tablet 100 mg  100 mg Oral QHS Ron Parker, MD   100 mg at 01/27/12 2116  . zolpidem (AMBIEN) tablet 5 mg  5 mg Oral QHS PRN Ron Parker, MD      . DISCONTD: metoprolol tartrate (LOPRESSOR) tablet 25 mg  25 mg Oral Daily Ron Parker, MD   25 mg at 01/27/12 1305  . DISCONTD: ramipril (ALTACE) capsule 2.5 mg  2.5 mg Oral Daily Ron Parker, MD   2.5 mg at 01/27/12 1303    PE: General appearance: alert, cooperative, no distress and The patient was standing up getting ready to use the bathroom, Lungs: clear to auscultation bilaterally, Decreased Effort. Heart: regular rate and rhythm, S1, S2 normal, no murmur, click, rub or gallop Extremities: No LEE Pulses: Radials 2+ and symmetric  Lab Results:   Basename 01/27/12 0640 01/26/12 0520  WBC 14.0* 12.7*  HGB 14.9 15.1  HCT 42.9 43.1  PLT 235 246   BMET  Basename 01/27/12 0640 01/26/12 0520  NA 133* 137  K 4.4 3.7  CL 95* 95*  CO2 28 30  GLUCOSE 131* 127*  BUN 16 17  CREATININE 1.01 0.96  CALCIUM 9.4 9.7    Cardiac Enzymes CK, MB 1.7 2.1 1.9  Total CK 230 451 454  Troponin I <0.30 <0.30 <0.30   Studies/Results: MYOCARDIAL IMAGING WITH SPECT (REST AND PHARMACOLOGIC-STRESS)  GATED LEFT VENTRICULAR WALL MOTION STUDY  LEFT VENTRICULAR EJECTION FRACTION  Technique: Standard myocardial SPECT imaging was performed after  resting intravenous injection of 10 mCi Tc-37m tetrofosmin.  Subsequently, intravenous infusion of Lexiscan was performed under  the supervision of the Cardiology staff. At peak effect of the  drug, 30 mCi Tc-23m tetrofosmin was injected intravenously and  standard myocardial SPECT imaging was performed. Quantitative  gated imaging was also performed to evaluate  left ventricular wall  motion, and estimate left ventricular ejection fraction.  Comparison: None.  Findings:  SPECT imaging demonstrates fixed defect inferiorly  compatible with old infarct/scar. No reversible area to suggest  ischemia.  Quantitative gated analysis shows diffuse hypokinesia, most  pronounced inferiorly.  The resting left ventricular ejection fraction is 35% with end-  diastolic volume of 80 ml and end-systolic volume of 52 ml.  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 No evidence of  ischemia.  Ejection fraction 35%  Assessment/Plan  Principal Problem:  *Chest pain Active Problems:  DIABETES, TYPE 2  CAD  Atrial fibrillation  PERIPHERAL VASCULAR DISEASE  C O P D  RENAL INSUFFICIENCY  Peripheral neuropathy  Bronchitis, acute, with chronic airway obstruction  Plan:  Nuclear stress test with new reduced EF. No inducible ischemia.  2D Echo is pending.  BP stable at 112/56.     LOS: 4 days    HAGER,BRYAN W 01/28/2012 11:32 AM  I have seen and examined the patient along with HAGER,BRYAN W, PA.  I have reviewed the chart, notes and new data.  I agree with PA's note.  Key new complaints:" I feel well! I want to go home!" Key new findings / data: bedside review of echo shows normal EF and normal wall motion.  PLAN: DC home. Follow-up in our [place] office.  Thurmon Fair, MD, [facility] [phone] 01/28/2012, 5:35 PM

## 2012-01-28 NOTE — Progress Notes (Deleted)
  Echocardiogram 2D Echocardiogram has been performed.  Chad Morse, Real Cons 01/28/2012, 5:44 PM

## 2012-01-28 NOTE — Progress Notes (Signed)
  Echocardiogram 2D Echocardiogram has been performed.  Malaquias Lenker, Real Cons 01/28/2012, 5:35 PM

## 2012-08-21 ENCOUNTER — Emergency Department (HOSPITAL_COMMUNITY)
Admission: EM | Admit: 2012-08-21 | Discharge: 2012-08-21 | Disposition: A | Discharge status: Home / Self Care | Payer: Medicare Other | Attending: Emergency Medicine | Admitting: Emergency Medicine

## 2012-08-21 ENCOUNTER — Emergency Department (HOSPITAL_COMMUNITY): Payer: Medicare Other

## 2012-08-21 ENCOUNTER — Encounter (HOSPITAL_COMMUNITY): Payer: Self-pay | Admitting: *Deleted

## 2012-08-21 DIAGNOSIS — Z8719 Personal history of other diseases of the digestive system: Secondary | ICD-10-CM | POA: Insufficient documentation

## 2012-08-21 DIAGNOSIS — Z8679 Personal history of other diseases of the circulatory system: Secondary | ICD-10-CM | POA: Insufficient documentation

## 2012-08-21 DIAGNOSIS — Z7982 Long term (current) use of aspirin: Secondary | ICD-10-CM | POA: Insufficient documentation

## 2012-08-21 DIAGNOSIS — G909 Disorder of the autonomic nervous system, unspecified: Secondary | ICD-10-CM | POA: Insufficient documentation

## 2012-08-21 DIAGNOSIS — K5732 Diverticulitis of large intestine without perforation or abscess without bleeding: Secondary | ICD-10-CM | POA: Insufficient documentation

## 2012-08-21 DIAGNOSIS — E860 Dehydration: Secondary | ICD-10-CM | POA: Insufficient documentation

## 2012-08-21 DIAGNOSIS — I739 Peripheral vascular disease, unspecified: Secondary | ICD-10-CM | POA: Insufficient documentation

## 2012-08-21 DIAGNOSIS — Z79899 Other long term (current) drug therapy: Secondary | ICD-10-CM | POA: Insufficient documentation

## 2012-08-21 DIAGNOSIS — F17200 Nicotine dependence, unspecified, uncomplicated: Secondary | ICD-10-CM | POA: Insufficient documentation

## 2012-08-21 DIAGNOSIS — J4489 Other specified chronic obstructive pulmonary disease: Secondary | ICD-10-CM | POA: Insufficient documentation

## 2012-08-21 DIAGNOSIS — R197 Diarrhea, unspecified: Secondary | ICD-10-CM | POA: Insufficient documentation

## 2012-08-21 DIAGNOSIS — J449 Chronic obstructive pulmonary disease, unspecified: Secondary | ICD-10-CM | POA: Insufficient documentation

## 2012-08-21 DIAGNOSIS — E1149 Type 2 diabetes mellitus with other diabetic neurological complication: Secondary | ICD-10-CM | POA: Insufficient documentation

## 2012-08-21 LAB — COMPREHENSIVE METABOLIC PANEL
ALT: 21 U/L (ref 0–53)
AST: 23 U/L (ref 0–37)
Albumin: 4.1 g/dL (ref 3.5–5.2)
Alkaline Phosphatase: 127 U/L — ABNORMAL HIGH (ref 39–117)
BUN: 11 mg/dL (ref 6–23)
Chloride: 98 mEq/L (ref 96–112)
Potassium: 4 mEq/L (ref 3.5–5.1)
Sodium: 139 mEq/L (ref 135–145)
Total Bilirubin: 0.7 mg/dL (ref 0.3–1.2)

## 2012-08-21 LAB — CBC WITH DIFFERENTIAL/PLATELET
Basophils Absolute: 0 10*3/uL (ref 0.0–0.1)
Eosinophils Absolute: 0.2 10*3/uL (ref 0.0–0.7)
Lymphocytes Relative: 20 % (ref 12–46)
Lymphs Abs: 2.3 10*3/uL (ref 0.7–4.0)
Neutrophils Relative %: 69 % (ref 43–77)
Platelets: 209 10*3/uL (ref 150–400)
RBC: 5.75 MIL/uL (ref 4.22–5.81)
RDW: 13.2 % (ref 11.5–15.5)
WBC: 11.2 10*3/uL — ABNORMAL HIGH (ref 4.0–10.5)

## 2012-08-21 MED ORDER — SODIUM CHLORIDE 0.9 % IV SOLN: INTRAVENOUS | Status: DC

## 2012-08-21 MED ORDER — SODIUM CHLORIDE 0.9 % IV BOLUS (SEPSIS)
1000.0000 mL | Freq: Once | INTRAVENOUS | Status: AC
  Administered 2012-08-21: 1000 mL via INTRAVENOUS

## 2012-08-21 MED ORDER — SODIUM CHLORIDE 0.9 % IV BOLUS (SEPSIS)
500.0000 mL | Freq: Once | INTRAVENOUS | Status: AC
  Administered 2012-08-21: 500 mL via INTRAVENOUS

## 2012-08-21 MED ORDER — IOHEXOL 300 MG/ML  SOLN
100.0000 mL | Freq: Once | INTRAMUSCULAR | Status: AC | PRN
  Administered 2012-08-21: 100 mL via INTRAVENOUS

## 2012-08-21 NOTE — ED Provider Notes (Signed)
History     CSN: [id number]  Arrival date & time 08/21/12  1332   First MD Initiated Contact with Patient 08/21/12 1633      Chief Complaint  Patient presents with  . Diarrhea    (Consider location/radiation/quality/duration/timing/severity/associated sxs/prior treatment) HPI  Patient reports for the past 3 weeks he's been having watery diarrhea. He reports however 2 weeks ago he was constipated which is more of a common complaint and since then he has had diarrhea. He states he's having 6 episodes of watery diarrhea a day. He has been taking Imodium and since he took Imodium this morning he's not had any more diarrhea this afternoon. He denies nausea, vomiting, fever, dizziness, lightheadedness, or feeling weak. He denies abdominal pain but states his abdomen feels bloated. He states he felt this way before when he had diverticulitis a couple years ago. He states nothing makes them feel better, nothing makes it feel worse. He denies been on antibiotics recently. He states he called with his primary care doctor and his GI doctor today and they told him to come to the ED.  PCP Dr. Juanetta Gosling  Past Medical History  Diagnosis Date  . COPD (chronic obstructive pulmonary disease)   . Diabetes mellitus   . Atrial fibrillation   . Peripheral neuropathy   . Gallstones   . Peripheral vascular disease     Past Surgical History  Procedure Date  . Back surgery     History reviewed. No pertinent family history.  History  Substance Use Topics  . Smoking status: Current Every Day Smoker -- 0.5 packs/day  . Smokeless tobacco: Not on file  . Alcohol Use: No   Lives at home Lives with spouse   Review of Systems  All other systems reviewed and are negative.    Allergies  Ciprofloxacin; Penicillins; and Sulfonamide derivatives  Home Medications   Current Outpatient Rx  Name  Route  Sig  Dispense  Refill  . ALLOPURINOL 100 MG PO TABS   Oral   Take 100 mg by mouth daily.           . ASPIRIN EC 81 MG PO TBEC   Oral   Take 81 mg by mouth daily.         Marland Kitchen BACLOFEN 20 MG PO TABS   Oral   Take 20 mg by mouth 4 (four) times daily.         Marland Kitchen CALCIUM CARBONATE ANTACID 500 MG PO CHEW   Oral   Chew 2 tablets by mouth 3 (three) times daily as needed. For indigestion         . CILOSTAZOL 50 MG PO TABS   Oral   Take 50 mg by mouth 2 (two) times daily.         Marland Kitchen CLOPIDOGREL BISULFATE 75 MG PO TABS   Oral   Take 75 mg by mouth daily.         Marland Kitchen DILTIAZEM HCL ER 120 MG PO CP24   Oral   Take 120 mg by mouth daily.         Marland Kitchen FLUTICASONE-SALMETEROL 250-50 MCG/DOSE IN AEPB   Inhalation   Inhale 1 puff into the lungs every 12 (twelve) hours.         Marland Kitchen FOLIC ACID-VIT B6-VIT B12 2.5-25-1 MG PO TABS   Oral   Take 1 tablet by mouth daily.         . FUROSEMIDE 40 MG PO TABS   Oral  Take 40 mg by mouth daily.         . GUAIFENESIN ER 600 MG PO TB12   Oral   Take 1 tablet (600 mg total) by mouth 2 (two) times daily.   14 tablet   0   . HYDROCODONE-ACETAMINOPHEN 10-325 MG PO TABS   Oral   Take 1 tablet by mouth 2 (two) times daily as needed. For pain         . MAGNESIUM HYDROXIDE 311 MG PO CHEW   Oral   Chew 311 mg by mouth every 4 (four) hours as needed. For indigestion         . METOPROLOL TARTRATE 50 MG PO TABS   Oral   Take 25 mg by mouth daily.         Marland Kitchen NAPROXEN SODIUM 220 MG PO TABS   Oral   Take 220 mg by mouth 2 (two) times daily as needed. For leg pain         . PANTOPRAZOLE SODIUM 40 MG PO TBEC   Oral   Take 40 mg by mouth daily.         Marland Kitchen RAMIPRIL 2.5 MG PO CAPS   Oral   Take 2.5 mg by mouth daily.         Marland Kitchen SIMVASTATIN 20 MG PO TABS   Oral   Take 20 mg by mouth every evening.         Marland Kitchen TIZANIDINE HCL 4 MG PO TABS   Oral   Take 4 mg by mouth every 6 (six) hours.         . TRAZODONE HCL 50 MG PO TABS   Oral   Take 100 mg by mouth at bedtime.         Marland Kitchen ZOLPIDEM TARTRATE ER 12.5 MG PO TBCR    Oral   Take 12.5 mg by mouth at bedtime. For sleep           BP 171/60  Pulse 70  Temp 97.9 F (36.6 C) (Oral)  Resp 24  Ht 6' (1.829 m)  Wt 213 lb (96.616 kg)  BMI 28.89 kg/m2  SpO2 96%  Vital signs normal except hypertension   Physical Exam  Nursing note and vitals reviewed. Constitutional: He is oriented to person, place, and time. He appears well-developed and well-nourished.  Non-toxic appearance. He does not appear ill. No distress.  HENT:  Head: Normocephalic and atraumatic.  Right Ear: External ear normal.  Left Ear: External ear normal.  Nose: Nose normal. No mucosal edema or rhinorrhea.  Mouth/Throat: Mucous membranes are normal. No dental abscesses or uvula swelling.       Tongue dry  Eyes: Conjunctivae normal and EOM are normal. Pupils are equal, round, and reactive to light.  Neck: Normal range of motion and full passive range of motion without pain. Neck supple.  Cardiovascular: Normal rate, regular rhythm and normal heart sounds.  Exam reveals no gallop and no friction rub.   No murmur heard. Pulmonary/Chest: Effort normal and breath sounds normal. No respiratory distress. He has no wheezes. He has no rhonchi. He has no rales. He exhibits no tenderness and no crepitus.  Abdominal: Soft. Normal appearance and bowel sounds are normal. He exhibits distension. There is tenderness. There is no rebound and no guarding.    Musculoskeletal: Normal range of motion. He exhibits no edema and no tenderness.       Moves all extremities well.   Neurological: He is alert and oriented  to person, place, and time. He has normal strength. No cranial nerve deficit.  Skin: Skin is warm, dry and intact. No rash noted. No erythema. No pallor.  Psychiatric: He has a normal mood and affect. His speech is normal and behavior is normal. His mood appears not anxious.    ED Course  Procedures (including critical care time)  Medications  0.9 %  sodium chloride infusion (not  administered)  glipiZIDE (GLUCOTROL XL) 2.5 MG 24 hr tablet (not administered)  sodium chloride 0.9 % bolus 500 mL (0 mL Intravenous Stopped 08/21/12 1919)  iohexol (OMNIPAQUE) 300 MG/ML solution 100 mL (100 mL Intravenous Contrast Given 08/21/12 1801)  sodium chloride 0.9 % bolus 1,000 mL (1000 mL Intravenous New Bag/Given 08/21/12 2028)   Patient only had about 200 cc urine output after 1 L of IV fluid. He was given a second liter for his dehydration, his hemoglobin is up to almost 18 from a baseline of 15 consistent with moderate dehydration. We discussed treatment for his diarrhea. Patient states he has an appointment to be seen by his gastroenterologist on December 10.  Results for orders placed during the hospital encounter of 08/21/12  CBC WITH DIFFERENTIAL      Component Value Range   WBC 11.2 (*) 4.0 - 10.5 K/uL   RBC 5.75  4.22 - 5.81 MIL/uL   Hemoglobin 17.7 (*) 13.0 - 17.0 g/dL   HCT 47.8  29.5 - 62.1 %   MCV 88.0  78.0 - 100.0 fL   MCH 30.8  26.0 - 34.0 pg   MCHC 35.0  30.0 - 36.0 g/dL   RDW 30.8  65.7 - 84.6 %   Platelets 209  150 - 400 K/uL   Neutrophils Relative 69  43 - 77 %   Neutro Abs 7.7  1.7 - 7.7 K/uL   Lymphocytes Relative 20  12 - 46 %   Lymphs Abs 2.3  0.7 - 4.0 K/uL   Monocytes Relative 9  3 - 12 %   Monocytes Absolute 1.0  0.1 - 1.0 K/uL   Eosinophils Relative 2  0 - 5 %   Eosinophils Absolute 0.2  0.0 - 0.7 K/uL   Basophils Relative 0  0 - 1 %   Basophils Absolute 0.0  0.0 - 0.1 K/uL  COMPREHENSIVE METABOLIC PANEL      Component Value Range   Sodium 139  135 - 145 mEq/L   Potassium 4.0  3.5 - 5.1 mEq/L   Chloride 98  96 - 112 mEq/L   CO2 28  19 - 32 mEq/L   Glucose, Bld 94  70 - 99 mg/dL   BUN 11  6 - 23 mg/dL   Creatinine, Ser 9.62  0.50 - 1.35 mg/dL   Calcium 95.2  8.4 - 84.1 mg/dL   Total Protein 7.8  6.0 - 8.3 g/dL   Albumin 4.1  3.5 - 5.2 g/dL   AST 23  0 - 37 U/L   ALT 21  0 - 53 U/L   Alkaline Phosphatase 127 (*) 39 - 117 U/L   Total  Bilirubin 0.7  0.3 - 1.2 mg/dL   GFR calc non Af Amer 71 (*) >90 mL/min   GFR calc Af Amer 82 (*) >90 mL/min  LIPASE, BLOOD      Component Value Range   Lipase 20  11 - 59 U/L   Laboratory interpretation all normal except concentrated hemoglobin consistent with dehydration, leukocytosis   Ct Abdomen Pelvis W Contrast  08/21/2012  *RADIOLOGY REPORT*  Clinical Data: Diarrhea, abdominal pain, bloating  CT ABDOMEN AND PELVIS WITH CONTRAST  Technique:  Multidetector CT imaging of the abdomen and pelvis was performed following the standard protocol during bolus administration of intravenous contrast.  Contrast: OMNIPAQUE IOHEXOL 300 MG/ML  SOLN  Comparison: 03/01/2010  Findings: Sagittal images of the spine shows diffuse osteopenia. Degenerative changes upper lumbar spine are noted.  Lung bases are unremarkable.  The enhanced liver shows no biliary ductal dilatation.  Cyst in left hepatic lobe is stable.  A cyst in the upper pole of the right kidney measures 2.9 cm.  On the prior exam measures 2.4 cm.  There is a tiny cyst in mid pole of the left kidney measures 7 mm.  No hydronephrosis or hydroureter.  Small gallstones are noted in gallbladder neck region the largest measures 5 mm.  No pericholecystic fluid is noted.  The pancreas, spleen and adrenal glands are unremarkable.  No hydronephrosis or hydroureter.  No small bowel obstruction.  Scattered diverticula are noted left colon.  There is no evidence of acute diverticulitis.  Multiple sigmoid colon diverticula are noted.  No definite evidence of acute diverticulitis.  There is no pericecal inflammation.  Normal appendix is clearly visualized axial image 67.  Delayed renal images shows bilateral renal symmetrical excretion. Bilateral visualized proximal ureter is unremarkable.  Atherosclerotic calcifications of the abdominal aorta and the iliac arteries are noted.  There is no aortic aneurysm.  Evaluation of the pelvis is limited by metallic artifacts  from left hip prosthesis.  The nonspecific minimal thickening of the urinary bladder wall.  Prostate gland is unremarkable.  No pelvic ascites or adenopathy.   No ascites or free air.  No adenopathy.  IMPRESSION:  1.  No hydronephrosis or hydroureter.  Again noted cyst in upper pole of the right kidney measures 2.9 cm on the prior exam measures 2.4 cm.  2.  No small bowel obstruction. 3.  Normal appendix.  No pericecal inflammation. 4.  Multiple sigmoid colon diverticula are noted.  There is no definite evidence of acute diverticulitis. 5.  Small gallstones are noted in gallbladder neck region the largest measures 5 mm.   Original Report Authenticated By: Natasha Mead, M.D.      1. Diarrhea   2. Dehydration    Plan discharge  Devoria Albe, MD, FACEP     MDM          Ward Givens, MD 08/21/12 2033

## 2012-08-21 NOTE — ED Notes (Signed)
Pt was given 1 liter bolus prior to my arrival.

## 2012-08-21 NOTE — ED Notes (Signed)
Diarrhea for 3 weeks, worse last 3 days.  Has been seen by Dr Juanetta Gosling for same.   Has been taking immodium. , No diarrhea in last 4 hours.  No vomiting.  Fever and chills at home.  Abd is "bloated".

## 2012-08-21 NOTE — ED Notes (Signed)
Family at bedside. Patient would like to know how long before the nurse starts an IV. RN made aware.

## 2012-08-21 NOTE — ED Notes (Signed)
When I took over patient, pt was a little upset about his wait. I assured pt that we would take care of him.

## 2012-08-25 ENCOUNTER — Ambulatory Visit (INDEPENDENT_AMBULATORY_CARE_PROVIDER_SITE_OTHER): Payer: Medicare Other | Admitting: Internal Medicine

## 2012-08-25 ENCOUNTER — Encounter (INDEPENDENT_AMBULATORY_CARE_PROVIDER_SITE_OTHER): Payer: Self-pay | Admitting: Internal Medicine

## 2012-08-25 VITALS — BP 130/70 | HR 64 | Temp 98.2°F | Ht 72.0 in | Wt 220.3 lb

## 2012-08-25 DIAGNOSIS — R198 Other specified symptoms and signs involving the digestive system and abdomen: Secondary | ICD-10-CM | POA: Insufficient documentation

## 2012-08-25 NOTE — Patient Instructions (Addendum)
Amitiza twice a day. Call in 2 weeks for PR report

## 2012-08-25 NOTE — Progress Notes (Signed)
Subjective:     Patient ID: Chad Morse, male   DOB: 1941/02/28, 71 y.o.   MRN: [id number]  HPI Referred to our office by Dr. Juanetta Gosling for diarrhea/constipation Apparently seen in the ED for dehydration this past Friday.  He had diarrhea x 1 week. He received 2 bags of fluid.  He thinks he had a fever. He had chills. He had belching and he said his abdomen was swollen.   Symptoms resolved that afternoon.  He did however continue to have bloating.  Sunday he felt he needed to have a BM, but he couldn't. He only expelled gas. He took 2 Senakot without results.  He then took 4 Senakot and finally had a small BM.  He tells me he alternates between constipation and diarrhea.  He has diarrhea about every other week.  He has not increased fiber in his diet. Appetite is good. No weight loss. No abdominal pain. He does c/o gas. His last colonoscopy was in [place] 2 yrs ago  by Dr. Karilyn Cota and was normal except for polyps.  He also had another colonoscopy by Dr. Christella Hartigan 2 weeks after his last colonoscopy in 2011 for rectal bleeding from the polyp sites. Clamps placed. Bleeding felt to be to Plavix.  08/21/2012 CT abdomen/pelvis with CM:IMPRESSION:  1. No hydronephrosis or hydroureter. Again noted cyst in upper  pole of the right kidney measures 2.9 cm on the prior exam measures  2.4 cm.  2. No small bowel obstruction.  3. Normal appendix. No pericecal inflammation.  4. Multiple sigmoid colon diverticula are noted. There is no  definite evidence of acute diverticulitis.  5. Small gallstones are noted in gallbladder neck region the  largest measures 5 mm.  CBC    Component Value Date/Time   WBC 11.2* 08/21/2012 1725   RBC 5.75 08/21/2012 1725   HGB 17.7* 08/21/2012 1725   HCT 50.6 08/21/2012 1725   PLT 209 08/21/2012 1725   MCV 88.0 08/21/2012 1725   MCH 30.8 08/21/2012 1725   MCHC 35.0 08/21/2012 1725   RDW 13.2 08/21/2012 1725   LYMPHSABS 2.3 08/21/2012 1725   MONOABS 1.0 08/21/2012 1725   EOSABS 0.2  08/21/2012 1725   BASOSABS 0.0 08/21/2012 1725    CMP     Component Value Date/Time   NA 139 08/21/2012 1725   K 4.0 08/21/2012 1725   CL 98 08/21/2012 1725   CO2 28 08/21/2012 1725   GLUCOSE 94 08/21/2012 1725   BUN 11 08/21/2012 1725   CREATININE 1.02 08/21/2012 1725   CALCIUM 10.2 08/21/2012 1725   PROT 7.8 08/21/2012 1725   ALBUMIN 4.1 08/21/2012 1725   AST 23 08/21/2012 1725   ALT 21 08/21/2012 1725   ALKPHOS 127* 08/21/2012 1725   BILITOT 0.7 08/21/2012 1725   GFRNONAA 71* 08/21/2012 1725   GFRAA 82* 08/21/2012 1725      Review of Systems see hpi Current Outpatient Prescriptions  Medication Sig Dispense Refill  . allopurinol (ZYLOPRIM) 100 MG tablet Take 100 mg by mouth daily.      Marland Kitchen aspirin EC 81 MG tablet Take 81 mg by mouth daily.      . baclofen (LIORESAL) 20 MG tablet Take 20 mg by mouth 4 (four) times daily.      . cilostazol (PLETAL) 50 MG tablet Take 50 mg by mouth 2 (two) times daily.      . clopidogrel (PLAVIX) 75 MG tablet Take 75 mg by mouth daily.      Marland Kitchen  diltiazem (DILACOR XR) 120 MG 24 hr capsule Take 120 mg by mouth daily.      Marland Kitchen docusate sodium (PHILLIPS LIQUI-GELS) 100 MG capsule Take 100 mg by mouth daily.      . Folic Acid-Vit B6-Vit B12 (FOLBEE) 2.5-25-1 MG TABS Take 1 tablet by mouth daily.      . furosemide (LASIX) 40 MG tablet Take 40 mg by mouth daily.      Marland Kitchen glipiZIDE (GLUCOTROL XL) 2.5 MG 24 hr tablet Take 2.5 mg by mouth daily.      Marland Kitchen HYDROcodone-acetaminophen (NORCO) 10-325 MG per tablet Take 1 tablet by mouth 2 (two) times daily as needed. For pain      . metoprolol (LOPRESSOR) 50 MG tablet Take 25 mg by mouth daily.       . naproxen sodium (ANAPROX) 220 MG tablet Take 220 mg by mouth 2 (two) times daily as needed. For leg pain      . pantoprazole (PROTONIX) 40 MG tablet Take 40 mg by mouth daily.      . ramipril (ALTACE) 2.5 MG capsule Take 2.5 mg by mouth daily.      . simvastatin (ZOCOR) 20 MG tablet Take 20 mg by mouth every evening.      Marland Kitchen  tiZANidine (ZANAFLEX) 4 MG tablet Take 4 mg by mouth every 6 (six) hours.      . traZODone (DESYREL) 50 MG tablet Take 100 mg by mouth at bedtime.      Marland Kitchen zolpidem (AMBIEN CR) 12.5 MG CR tablet Take 12.5 mg by mouth at bedtime. For sleep       Past Medical History  Diagnosis Date  . COPD (chronic obstructive pulmonary disease)   . Diabetes mellitus   . Atrial fibrillation   . Peripheral neuropathy   . Gallstones   . Peripheral vascular disease    Past Surgical History  Procedure Date  . Back surgery    Allergies  Allergen Reactions  . Ciprofloxacin Hives  . Penicillins Other (See Comments)    REACTION: hives  . Sulfonamide Derivatives Other (See Comments)    REACTION: hives        Objective:   Physical Exam Filed Vitals:   08/25/12 0954  BP: 130/70  Pulse: 64  Temp: 98.2 F (36.8 C)  Height: 6' (1.829 m)  Weight: 220 lb 4.8 oz (99.927 kg)  Alert and oriented. Skin warm and dry. Oral mucosa is moist.   . Sclera anicteric, conjunctivae is pink. Thyroid not enlarged. No cervical lymphadenopathy. Lungs clear. Heart regular rate and rhythm.  Abdomen is soft. Bowel sounds are positive. No hepatomegaly. No abdominal masses felt. No tenderness.  No edema to lower extremities.    Walks with a walker.       Assessment:    Diarrhea which has now resolved. He alternates between constipation and diarrhea. Possible IBS.     Plan:      Fiber 4 gms daily. Amitiza BID. PR in 2 weeks.

## 2012-11-05 ENCOUNTER — Ambulatory Visit (INDEPENDENT_AMBULATORY_CARE_PROVIDER_SITE_OTHER): Payer: Medicare Other | Admitting: Internal Medicine

## 2012-11-05 ENCOUNTER — Encounter (HOSPITAL_COMMUNITY): Payer: Self-pay | Admitting: *Deleted

## 2012-11-05 ENCOUNTER — Inpatient Hospital Stay (HOSPITAL_COMMUNITY): Payer: Medicare Other

## 2012-11-05 ENCOUNTER — Encounter (INDEPENDENT_AMBULATORY_CARE_PROVIDER_SITE_OTHER): Payer: Self-pay | Admitting: Internal Medicine

## 2012-11-05 ENCOUNTER — Inpatient Hospital Stay (HOSPITAL_COMMUNITY)
Admission: AD | Admit: 2012-11-05 | Discharge: 2012-11-13 | DRG: 389 | Disposition: A | Discharge status: Home Health | Payer: Medicare Other | Source: Ambulatory Visit | Attending: Pulmonary Disease | Admitting: Pulmonary Disease

## 2012-11-05 VITALS — BP 104/70 | HR 74 | Temp 97.1°F | Resp 20 | Ht 66.0 in | Wt 212.3 lb

## 2012-11-05 DIAGNOSIS — I739 Peripheral vascular disease, unspecified: Secondary | ICD-10-CM | POA: Diagnosis present

## 2012-11-05 DIAGNOSIS — A498 Other bacterial infections of unspecified site: Secondary | ICD-10-CM | POA: Diagnosis present

## 2012-11-05 DIAGNOSIS — K5900 Constipation, unspecified: Secondary | ICD-10-CM | POA: Diagnosis present

## 2012-11-05 DIAGNOSIS — K922 Gastrointestinal hemorrhage, unspecified: Secondary | ICD-10-CM | POA: Diagnosis present

## 2012-11-05 DIAGNOSIS — N289 Disorder of kidney and ureter, unspecified: Secondary | ICD-10-CM | POA: Diagnosis present

## 2012-11-05 DIAGNOSIS — I2510 Atherosclerotic heart disease of native coronary artery without angina pectoris: Secondary | ICD-10-CM | POA: Diagnosis present

## 2012-11-05 DIAGNOSIS — N259 Disorder resulting from impaired renal tubular function, unspecified: Secondary | ICD-10-CM | POA: Diagnosis present

## 2012-11-05 DIAGNOSIS — E860 Dehydration: Secondary | ICD-10-CM

## 2012-11-05 DIAGNOSIS — R112 Nausea with vomiting, unspecified: Secondary | ICD-10-CM | POA: Insufficient documentation

## 2012-11-05 DIAGNOSIS — G609 Hereditary and idiopathic neuropathy, unspecified: Secondary | ICD-10-CM | POA: Diagnosis present

## 2012-11-05 DIAGNOSIS — N390 Urinary tract infection, site not specified: Secondary | ICD-10-CM | POA: Diagnosis present

## 2012-11-05 DIAGNOSIS — E119 Type 2 diabetes mellitus without complications: Secondary | ICD-10-CM | POA: Diagnosis present

## 2012-11-05 DIAGNOSIS — J4489 Other specified chronic obstructive pulmonary disease: Secondary | ICD-10-CM | POA: Diagnosis present

## 2012-11-05 DIAGNOSIS — Z7982 Long term (current) use of aspirin: Secondary | ICD-10-CM

## 2012-11-05 DIAGNOSIS — Z7902 Long term (current) use of antithrombotics/antiplatelets: Secondary | ICD-10-CM

## 2012-11-05 DIAGNOSIS — Z79899 Other long term (current) drug therapy: Secondary | ICD-10-CM

## 2012-11-05 DIAGNOSIS — K56609 Unspecified intestinal obstruction, unspecified as to partial versus complete obstruction: Principal | ICD-10-CM | POA: Diagnosis present

## 2012-11-05 DIAGNOSIS — D62 Acute posthemorrhagic anemia: Secondary | ICD-10-CM | POA: Diagnosis present

## 2012-11-05 DIAGNOSIS — E876 Hypokalemia: Secondary | ICD-10-CM | POA: Diagnosis present

## 2012-11-05 LAB — COMPREHENSIVE METABOLIC PANEL
ALT: 41 U/L (ref 0–53)
AST: 33 U/L (ref 0–37)
Alkaline Phosphatase: 92 U/L (ref 39–117)
CO2: 35 mEq/L — ABNORMAL HIGH (ref 19–32)
Chloride: 87 mEq/L — ABNORMAL LOW (ref 96–112)
GFR calc Af Amer: 62 mL/min — ABNORMAL LOW (ref >90)
GFR calc non Af Amer: 54 mL/min — ABNORMAL LOW (ref >90)
Glucose, Bld: 128 mg/dL — ABNORMAL HIGH (ref 70–99)
Potassium: 2.6 mEq/L — CL (ref 3.5–5.1)
Sodium: 134 mEq/L — ABNORMAL LOW (ref 135–145)

## 2012-11-05 LAB — CBC WITH DIFFERENTIAL/PLATELET
Basophils Absolute: 0.1 10*3/uL (ref 0.0–0.1)
Eosinophils Absolute: 0 10*3/uL (ref 0.0–0.7)
Lymphocytes Relative: 13 % (ref 12–46)
MCH: 30.7 pg (ref 26.0–34.0)
MCHC: 36 g/dL (ref 30.0–36.0)
Monocytes Absolute: 1.5 10*3/uL — ABNORMAL HIGH (ref 0.1–1.0)
Neutro Abs: 11.3 10*3/uL — ABNORMAL HIGH (ref 1.7–7.7)
Neutrophils Relative %: 76 % (ref 43–77)
RDW: 13.2 % (ref 11.5–15.5)

## 2012-11-05 MED ORDER — ACETAMINOPHEN 325 MG PO TABS: 650.0000 mg | ORAL_TABLET | Freq: Four times a day (QID) | ORAL | Status: DC | PRN

## 2012-11-05 MED ORDER — TIOTROPIUM BROMIDE MONOHYDRATE 18 MCG IN CAPS
18.0000 ug | ORAL_CAPSULE | Freq: Every day | RESPIRATORY_TRACT | Status: DC
  Administered 2012-11-06 – 2012-11-13 (×8): 18 ug via RESPIRATORY_TRACT
  Filled 2012-11-05 (×2): qty 5

## 2012-11-05 MED ORDER — HYDROCODONE-ACETAMINOPHEN 5-325 MG PO TABS
1.0000 | ORAL_TABLET | ORAL | Status: DC | PRN
  Administered 2012-11-05: 1 via ORAL
  Administered 2012-11-06 – 2012-11-12 (×8): 2 via ORAL
  Filled 2012-11-05: qty 2
  Filled 2012-11-05 (×2): qty 1
  Filled 2012-11-05 (×5): qty 2
  Filled 2012-11-05: qty 1
  Filled 2012-11-05: qty 2

## 2012-11-05 MED ORDER — PANTOPRAZOLE SODIUM 40 MG PO TBEC
40.0000 mg | DELAYED_RELEASE_TABLET | Freq: Every day | ORAL | Status: DC
  Administered 2012-11-06 – 2012-11-09 (×3): 40 mg via ORAL
  Filled 2012-11-05 (×3): qty 1

## 2012-11-05 MED ORDER — BACLOFEN 10 MG PO TABS
20.0000 mg | ORAL_TABLET | Freq: Two times a day (BID) | ORAL | Status: DC
  Administered 2012-11-05 – 2012-11-10 (×9): 20 mg via ORAL
  Filled 2012-11-05 (×11): qty 2

## 2012-11-05 MED ORDER — POTASSIUM CHLORIDE 10 MEQ/100ML IV SOLN
10.0000 meq | INTRAVENOUS | Status: AC
  Administered 2012-11-05 – 2012-11-06 (×4): 10 meq via INTRAVENOUS
  Filled 2012-11-05 (×2): qty 100

## 2012-11-05 MED ORDER — ONDANSETRON HCL 4 MG PO TABS
4.0000 mg | ORAL_TABLET | Freq: Four times a day (QID) | ORAL | Status: DC | PRN
  Administered 2012-11-06: 4 mg via ORAL

## 2012-11-05 MED ORDER — CILOSTAZOL 100 MG PO TABS
ORAL_TABLET | ORAL | Status: AC
  Filled 2012-11-05: qty 1

## 2012-11-05 MED ORDER — ACETAMINOPHEN 650 MG RE SUPP: 650.0000 mg | Freq: Four times a day (QID) | RECTAL | Status: DC | PRN

## 2012-11-05 MED ORDER — SODIUM CHLORIDE 0.9 % IV SOLN
INTRAVENOUS | Status: DC
  Administered 2012-11-05 – 2012-11-12 (×8): via INTRAVENOUS

## 2012-11-05 MED ORDER — TIZANIDINE HCL 2 MG PO TABS
2.0000 mg | ORAL_TABLET | Freq: Two times a day (BID) | ORAL | Status: DC
  Administered 2012-11-06 – 2012-11-11 (×7): 2 mg via ORAL
  Filled 2012-11-05 (×11): qty 1

## 2012-11-05 MED ORDER — TIZANIDINE HCL 4 MG PO TABS
ORAL_TABLET | ORAL | Status: AC
  Filled 2012-11-05: qty 2

## 2012-11-05 MED ORDER — DILTIAZEM HCL ER COATED BEADS 120 MG PO CP24
120.0000 mg | ORAL_CAPSULE | Freq: Every day | ORAL | Status: DC
  Administered 2012-11-06 – 2012-11-13 (×7): 120 mg via ORAL
  Filled 2012-11-05 (×9): qty 1

## 2012-11-05 MED ORDER — ZOLPIDEM TARTRATE 5 MG PO TABS
5.0000 mg | ORAL_TABLET | Freq: Every evening | ORAL | Status: DC | PRN
  Administered 2012-11-07: 5 mg via ORAL
  Filled 2012-11-05: qty 1

## 2012-11-05 MED ORDER — ONDANSETRON HCL 4 MG/2ML IJ SOLN
4.0000 mg | Freq: Four times a day (QID) | INTRAMUSCULAR | Status: DC | PRN
  Filled 2012-11-05 (×2): qty 2

## 2012-11-05 MED ORDER — ALLOPURINOL 100 MG PO TABS
100.0000 mg | ORAL_TABLET | Freq: Every day | ORAL | Status: DC
  Administered 2012-11-06 – 2012-11-13 (×7): 100 mg via ORAL
  Filled 2012-11-05 (×8): qty 1

## 2012-11-05 MED ORDER — CILOSTAZOL 100 MG PO TABS
50.0000 mg | ORAL_TABLET | Freq: Two times a day (BID) | ORAL | Status: DC
  Administered 2012-11-05 – 2012-11-13 (×12): 50 mg via ORAL
  Filled 2012-11-05 (×9): qty 0.5
  Filled 2012-11-05: qty 1
  Filled 2012-11-05 (×4): qty 0.5
  Filled 2012-11-05: qty 1
  Filled 2012-11-05 (×7): qty 0.5

## 2012-11-05 MED ORDER — ASPIRIN EC 81 MG PO TBEC
81.0000 mg | DELAYED_RELEASE_TABLET | Freq: Every day | ORAL | Status: DC
  Administered 2012-11-06 – 2012-11-08 (×2): 81 mg via ORAL
  Filled 2012-11-05 (×3): qty 1

## 2012-11-05 MED ORDER — ALBUTEROL SULFATE (5 MG/ML) 0.5% IN NEBU: 2.5000 mg | INHALATION_SOLUTION | RESPIRATORY_TRACT | Status: DC | PRN

## 2012-11-05 MED ORDER — TIZANIDINE HCL 4 MG PO TABS
8.0000 mg | ORAL_TABLET | Freq: Two times a day (BID) | ORAL | Status: DC
  Administered 2012-11-05 – 2012-11-11 (×8): 8 mg via ORAL
  Filled 2012-11-05 (×12): qty 2

## 2012-11-05 MED ORDER — METOPROLOL TARTRATE 25 MG PO TABS
25.0000 mg | ORAL_TABLET | Freq: Every day | ORAL | Status: DC
  Administered 2012-11-06 – 2012-11-13 (×7): 25 mg via ORAL
  Filled 2012-11-05 (×8): qty 1

## 2012-11-05 MED ORDER — CLOPIDOGREL BISULFATE 75 MG PO TABS
75.0000 mg | ORAL_TABLET | Freq: Every day | ORAL | Status: DC
  Administered 2012-11-06 – 2012-11-08 (×2): 75 mg via ORAL
  Filled 2012-11-05 (×3): qty 1

## 2012-11-05 MED ORDER — TIZANIDINE HCL 2 MG PO TABS: 2.0000 mg | ORAL_TABLET | Freq: Four times a day (QID) | ORAL | Status: DC

## 2012-11-05 MED ORDER — TRAZODONE HCL 50 MG PO TABS
100.0000 mg | ORAL_TABLET | Freq: Every day | ORAL | Status: DC
  Administered 2012-11-05 – 2012-11-12 (×7): 100 mg via ORAL
  Filled 2012-11-05 (×8): qty 2

## 2012-11-05 MED ORDER — ENOXAPARIN SODIUM 30 MG/0.3ML ~~LOC~~ SOLN
30.0000 mg | SUBCUTANEOUS | Status: DC
  Administered 2012-11-05: 30 mg via SUBCUTANEOUS
  Filled 2012-11-05: qty 0.3

## 2012-11-05 NOTE — Progress Notes (Signed)
Paged Dr. Juanetta Gosling himself around 2100 about patient results and critical lab value but no call back so i just paged on call Dr. Megan Mans to inform of patient critical K+ and CT abd results. Waiting on call back.

## 2012-11-05 NOTE — Patient Instructions (Addendum)
Patient to be admitted to Dr. Juanetta Gosling service for IV fluids and further management. Please take patient to admitting at [facility].

## 2012-11-05 NOTE — Progress Notes (Signed)
11/05/12 1905 Patient watched patient safety video on admission, bedalarm on for safety, red socks applied on admission. Instructed to call for assist and not attempt getting up on his own, wife at bedside. Stated would call. Reviewed fall prevention/safety plan with patient and wife, signed acknowledgement form. Earnstine Regal, RN

## 2012-11-05 NOTE — Progress Notes (Signed)
Presenting complaint;  Nausea vomiting abdominal distention and weakness.  Subjective:  Patient is 72 year old Caucasian male with multiple medical problems who was seen in our office by Ms. Dorene Ar NP on 08/25/2012. He was begun on Linzess which she stopped after taking 2 doses because of diarrhea. Since then he's been having diarrhea alternating with constipation. He can go as many as 5 days without a bowel movement. Her knee feels constipated he will take 2 Senokot tablets and then gets cleaned out. He develops loose stools and he would take Imodium and then he becomes constipated. However 8 days ago he developed diarrhea which would not stop despite taking 3 Imodium as daily. He also has developed abdominal distention and nausea and very poor appetite. He was seen by Dr. Juanetta Gosling 2 days ago and felt to be quite sick. Dr. Juanetta Gosling requested that I see him. Patient is accompanied by his wife. He states he has not eaten anything in the last 2 days. He has been taking his pills with few sips of water. He did vomit bilious material this morning. He feels weak. He has been passing flatness but hasn't had a bowel movement in 48 hours. He feels distended and contains await abdominal pain across lower abdomen. He denies fever chills or dysuria. He is not noted change in his breathing pattern recently. He has lost 8 pounds since his last visit in he believes he has lost most of his weight this week. Patient's last colonoscopy was in August 2011 for post-polypectomy bleed. This procedure was done by Dr. Wendall Papa. He had colonoscopy 2 weeks earlier with polypectomy by me at [facility] in [facility]. Current Medications: Current Outpatient Prescriptions  Medication Sig Dispense Refill  . allopurinol (ZYLOPRIM) 100 MG tablet Take 100 mg by mouth daily.      Marland Kitchen aspirin EC 81 MG tablet Take 81 mg by mouth daily.      . baclofen (LIORESAL) 20 MG tablet Take 20 mg by mouth 4 (four) times daily.      .  cilostazol (PLETAL) 50 MG tablet Take 50 mg by mouth 2 (two) times daily.      . clopidogrel (PLAVIX) 75 MG tablet Take 75 mg by mouth daily.      Marland Kitchen diltiazem (DILACOR XR) 120 MG 24 hr capsule Take 120 mg by mouth daily.      . Folic Acid-Vit B6-Vit B12 (FOLBEE) 2.5-25-1 MG TABS Take 1 tablet by mouth daily.      . furosemide (LASIX) 40 MG tablet Take 40 mg by mouth daily.      Marland Kitchen glipiZIDE (GLUCOTROL XL) 2.5 MG 24 hr tablet Take 2.5 mg by mouth daily.      Marland Kitchen HYDROcodone-acetaminophen (NORCO) 10-325 MG per tablet Take 1 tablet by mouth 2 (two) times daily as needed. For pain      . metoprolol (LOPRESSOR) 50 MG tablet Take 25 mg by mouth daily.       . naproxen sodium (ANAPROX) 220 MG tablet Take 220 mg by mouth 2 (two) times daily as needed. For leg pain      . pantoprazole (PROTONIX) 40 MG tablet Take 40 mg by mouth daily.      . ramipril (ALTACE) 2.5 MG capsule Take 2.5 mg by mouth daily.      . simvastatin (ZOCOR) 20 MG tablet Take 20 mg by mouth every evening.      Marland Kitchen SPIRIVA HANDIHALER 18 MCG inhalation capsule Place 18 mcg into inhaler and inhale  daily.       . tiZANidine (ZANAFLEX) 4 MG tablet Take 4 mg by mouth every 6 (six) hours.      . traZODone (DESYREL) 50 MG tablet Take 100 mg by mouth at bedtime.      Marland Kitchen zolpidem (AMBIEN CR) 12.5 MG CR tablet Take 12.5 mg by mouth at bedtime. For sleep      . diphenoxylate-atropine (LOMOTIL) 2.5-0.025 MG per tablet        No current facility-administered medications for this visit.     Objective: Blood pressure 104/70, pulse 74, temperature 97.1 F (36.2 C), temperature source Oral, resp. rate 20, height 5\' 6"  (1.676 m), weight 212 lb 4.8 oz (96.299 kg). Patient appears acutely ill. Conjunctiva is pink. Sclera is nonicteric Oropharyngeal mucosa is dry. No neck masses or thyromegaly noted. Cardiac exam with regular rhythm normal S1 and S2. No murmur or gallop noted. He has rattling sound which appears to be coming from his upper airway. He  has bilateral expiratory rhonchi. Abdomen is distended and very tympanitic. Bowel sounds are hypoactive. He has vague tenderness in hypogastric region but no organomegaly or masses. No LE edema or clubbing noted.    Assessment:   Patient appears to be acutely ill. He appears to be dehydrated. Abdominal exam suggests that he may have developed ileus. Persistent diarrhea prior to onset of abdominal distention may suggest an underlying gastroenteritis. Some of his medications will result in constipation. As far as chronic  GI symptoms are concerned I believe primary  issue may be constipation secondary to his medications.    Plan:  Patient's condition discussed with Dr. Juanetta Gosling over the phone and he would be admitted to Dr. Juanetta Gosling service and I would be glad to assist with GI issues. Will obtain acute abdominal series.  If possible trazodone should be discontinued as it may be contribution to his GI symptoms. Will make further recommendations regarding his bowel problems while he is in the hospital.

## 2012-11-06 ENCOUNTER — Inpatient Hospital Stay (HOSPITAL_COMMUNITY): Payer: Medicare Other

## 2012-11-06 ENCOUNTER — Encounter (INDEPENDENT_AMBULATORY_CARE_PROVIDER_SITE_OTHER): Payer: Self-pay | Admitting: *Deleted

## 2012-11-06 DIAGNOSIS — K56609 Unspecified intestinal obstruction, unspecified as to partial versus complete obstruction: Secondary | ICD-10-CM

## 2012-11-06 LAB — BASIC METABOLIC PANEL
BUN: 36 mg/dL — ABNORMAL HIGH (ref 6–23)
Calcium: 7.8 mg/dL — ABNORMAL LOW (ref 8.4–10.5)
GFR calc Af Amer: 73 mL/min — ABNORMAL LOW (ref >90)
GFR calc non Af Amer: 63 mL/min — ABNORMAL LOW (ref >90)
Glucose, Bld: 89 mg/dL (ref 70–99)
Potassium: 2.9 mEq/L — ABNORMAL LOW (ref 3.5–5.1)
Sodium: 138 mEq/L (ref 135–145)

## 2012-11-06 LAB — CLOSTRIDIUM DIFFICILE BY PCR: Toxigenic C. Difficile by PCR: NEGATIVE

## 2012-11-06 MED ORDER — BISACODYL 10 MG RE SUPP
10.0000 mg | Freq: Once | RECTAL | Status: AC
  Administered 2012-11-06: 10 mg via RECTAL
  Filled 2012-11-06: qty 1

## 2012-11-06 MED ORDER — LORAZEPAM 2 MG/ML IJ SOLN
1.0000 mg | Freq: Once | INTRAMUSCULAR | Status: AC
  Administered 2012-11-06: 1 mg via INTRAVENOUS
  Filled 2012-11-06: qty 1

## 2012-11-06 MED ORDER — ENOXAPARIN SODIUM 40 MG/0.4ML ~~LOC~~ SOLN
40.0000 mg | SUBCUTANEOUS | Status: DC
  Administered 2012-11-06 – 2012-11-07 (×2): 40 mg via SUBCUTANEOUS
  Filled 2012-11-06 (×2): qty 0.4

## 2012-11-06 MED ORDER — MORPHINE SULFATE 2 MG/ML IJ SOLN
2.0000 mg | INTRAMUSCULAR | Status: DC | PRN
  Administered 2012-11-06 – 2012-11-09 (×3): 2 mg via INTRAVENOUS
  Filled 2012-11-06 (×3): qty 1

## 2012-11-06 NOTE — Progress Notes (Signed)
Patient's imaging studies suggest small bowel obstruction. NG tube placement was attempted by nursing staff around 2 PM but he could not tolerate it. Suspect he has partial small bowel obstruction possibly due to adhesions and if he does not improve with n.p.o. status he will need to have NG tube placement. If symptoms persist will also need surgical consultation.

## 2012-11-06 NOTE — Care Management Note (Signed)
    Page 1 of 2   11/13/2012     9:09:14 AM   CARE MANAGEMENT NOTE 11/13/2012  Patient:  Chad Morse, Chad Morse   Account Number:  [id number]  Date Initiated:  11/06/2012  Documentation initiated by:  Sharrie Rothman  Subjective/Objective Assessment:   Pt admitted from home with SBO. Pt lives with his wife and will return home at discharge. Pt uses a walker for home use. Pt is fairly independent with ADL's. Pt denies any need for HH at present.     Action/Plan:   Will continue to follow for any changes to discharge plans.   Anticipated DC Date:  11/09/2012   Anticipated DC Plan:  HOME/SELF CARE      DC Planning Services  CM consult      [facility] Choice  HOME HEALTH   Choice offered to / List presented to:  C-1 Patient        HH arranged  HH-1 RN      [facility] agency  [facility]   Status of service:  Completed, signed off Medicare Important Message given?  YES (If response is "NO", the following Medicare IM given date fields will be blank) Date Medicare IM given:  11/12/2012 Date Additional Medicare IM given:    Discharge Disposition:  HOME W HOME HEALTH SERVICES  Per UR Regulation:    If discussed at Long Length of Stay Meetings, dates discussed:   11/10/2012  11/12/2012    Comments:  11/13/12 1610 Arlyss Queen, RN BSN CM Pt discharged home today with [facility] RN. Alroy Bailiff of [facility] is aware and will collect the pts information from the chart. No DME needs noted. HH services to start within 48 hours. Pt and pts nurse aware of discharge arrangements.  11/12/12 1422 Arlyss Queen, RN BSN CM Pt for potential discharge for 11/13/12 with [facility]. Pt choose [facility] for RN. No DMe needs noted. Alroy Bailiff of [facility] is aware and will collect the pts information from the chart. No other CM or HH needs noted.  11/06/12 1335 Arlyss Queen, RN BSN CM

## 2012-11-06 NOTE — Progress Notes (Signed)
Attempted to place NGT as ordered by Dr. Karilyn Cota x2 without success.  Pt would not let me advance the tube. He pulled my hand back and removed the tube both times.  Made Dr. Karilyn Cota aware of the situation and the pts refusal.  New orders given for suppository.  After the suppository the patient had a BM that was small.  Made Dr. Karilyn Cota aware.  He came back to see the patient.

## 2012-11-06 NOTE — Progress Notes (Signed)
Passed on report to Rexford Maus that she would need to re page Dr. Sudie Bailey he is on call for [place] in relation to #1 patient am potassium was 2.9.  Paged MD x2 waiting on call return.

## 2012-11-06 NOTE — H&P (Signed)
NAMEDRAYCE, TAWIL NO.:  [id number]  MEDICAL RECORD NO.:  [id number]  LOCATION:  A341                          FACILITY:  [facility]  PHYSICIAN:  Santana Gosdin L. Juanetta Gosling, M.D.DATE OF BIRTH:  08-22-1941  DATE OF ADMISSION:  11/05/2012 DATE OF DISCHARGE:  LH                             HISTORY & PHYSICAL   REASON FOR ADMISSION:  Abdominal pain.  HISTORY:  Mr. Havlin is a 72 year old, who had been in my office earlier this week complaining of diarrhea and abdominal pain.  He has had this in the past on multiple occasions, most recently in December of this year.  He said that he had been started on medications for irritable bowel syndrome, but he only took 1 dose.  He continued to have abdominal pain and today went to see his gastroenterologist and has had significant bloating.  He had been placed on Lomotil briefly until he could get in to see the gastroenterologist.  His diarrhea has stopped. He is complaining of abdominal pain.  PAST MEDICAL HISTORY:  Positive for COPD, diabetes, atrial fibrillation, peripheral neuropathy, gallstones, peripheral vascular disease, and some sort of a lesion in his spine that has caused him to be weak in the legs.  He had back surgery.  FAMILY HISTORY:  Positive for pancreatic cancer, diabetes, hypertension, and COPD.  SOCIAL HISTORY:  The patient has about a 60 pack-year smoking history. He does not use any alcohol.  He does not use any illicit drugs and he lives with his wife.  ALLERGIES:  He is allergic to CIPRO, PENICILLIN, and SULFONAMIDE.  MEDICATIONS:  At home include. 1. Allopurinol 100 mg daily. 2. Aspirin 81 mg daily. 3. Baclofen 20 mg. 4. Pletal 50 mg b.i.d. 5. Plavix 75 mg daily. 6. Diltiazem 120 mg daily. 7. Lomotil as needed. 8. Maudie Flakes which is a vitamin daily. 9. Lasix 40 mg daily. 10.Glipizide 2.5 mg daily. 11.Norco 10/325 as needed for pain. 12.Metoprolol 50 mg one half b.i.d. 13.Protonix 40 mg  daily. 14.Altace 2.5 mg daily. 15.Simvastatin 20 mg daily. 16.Spiriva inhaler daily. 17.Zanaflex 2-8 mg depending on how he is doing. 18.Trazodone 100 mg at bedtime. 19.He takes zolpidem.  REVIEW OF SYSTEMS:  Except as mentioned is essentially negative.  PHYSICAL EXAMINATION:  VITAL SIGNS:  Blood pressure 123/87, pulse 100, temperature is 97.8, respirations 20.  He is 5 feet 6 inches, 214 pounds, O2 sats 93%. HEENT:  His pupils are reactive.  His nose and throat are clear. NECK:  Supple. CHEST:  With some rhonchi. ABDOMEN:  Markedly distended compared to what I saw earlier this week. HEART:  Mildly irregular. EXTREMITIES:  Chronic changes from weakness and neuropathy. CENTRAL NERVOUS SYSTEM:  With the exception of the problems with his back and his legs is normal.  ASSESSMENT:  He has abdominal pain.  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 now.  PLAN:  I am going to plan to have him start IV fluids.  He will have laboratory work, a abdominal x-ray, and then we can make further decisions depending on the results of that.  I will send a stool specimen for C. difficile.     Mahika Vanvoorhis L. Juanetta Gosling, M.D.  ELH/MEDQ  D:  11/05/2012  T:  11/06/2012  Job:  [id number]

## 2012-11-06 NOTE — Progress Notes (Signed)
INITIAL NUTRITION ASSESSMENT  DOCUMENTATION CODES Per approved criteria  -Not Applicable   INTERVENTION: - Due to NPO status, no nutrition interventions available at this time  - RD will continue to monitor as diet advances    NUTRITION DIAGNOSIS: Inadequate oral intake related to N/V, abdominal pain, diarrhea and constipation 2/2 possible gastroenteritis as evidenced by 8 lb weight loss.   Goal: Pt to meet >/= 90% of estimated needs   Monitor:  Weight, po intake, toleration of diet advancement, labs   Reason for Assessment: Malnutrition Screening Tool (MST=2)  72 y.o. male  Admitting Dx: <principal problem not specified>  ASSESSMENT:  Pt is 72 yo male with PMH of COPD, diabetes, atrial fibrillation, peripheral neuropathy, gallstones, and PVD. Pt was taking Linzess but stopped the medcation d/t alternating diarrhea and constipation. Pt says he can go as many as 5 days without a BM. A week ago, pt developed diarrhea which would not stop despite taking 3 imodium daily. Pt has also developed abdominal distention, nausea, and has and a poor appetite. Pt has not eating anything in the last 2 days. He feels weak and vomited bilious material on the morning of 2/20. Now pt has not has a BM in 48 hours. He believes he has lost 8 lbs since 08/25/2012 but feels most of this has been this past week.   Due to NPO status, no nutrition interventions available at this time. RD will continue to monitor as diet advances     Height: Ht Readings from Last 1 Encounters:  11/05/12 5\' 6"  (1.676 m)    Weight: Wt Readings from Last 1 Encounters:  11/06/12 213 lb 6.4 oz (96.798 kg)    Ideal Body Weight: 142 lbs   % Ideal Body Weight: 150%  Wt Readings from Last 10 Encounters:  11/06/12 213 lb 6.4 oz (96.798 kg)  11/05/12 212 lb 4.8 oz (96.299 kg)  08/25/12 220 lb 4.8 oz (99.927 kg)  08/21/12 213 lb (96.616 kg)  01/25/12 211 lb 6.7 oz (95.9 kg)  08/11/09 225 lb 8 oz (102.286 kg)    Usual  Body Weight: 220 lbs   % Usual Body Weight: 96%  BMI:  Body mass index is 34.46 kg/(m^2). - Obese   Estimated Nutritional Needs: Kcal: 1650-1850 Protein: 75-90 g Fluid: 2.2-2.5 L  Skin: intact   Diet Order: NPO   EDUCATION NEEDS: -No education needs identified at this time   Intake/Output Summary (Last 24 hours) at 11/06/12 0826 Last data filed at 11/06/12 0030  Gross per 24 hour  Intake      0 ml  Output    600 ml  Net   -600 ml    Last BM: PTA   Labs:   Recent Labs Lab 11/05/12 1907  NA 134*  K 2.6*  CL 87*  CO2 35*  BUN 41*  CREATININE 1.28  CALCIUM 8.2*  GLUCOSE 128*    CBG (last 3)  No results found for this basename: GLUCAP,  in the last 72 hours  Scheduled Meds: . allopurinol  100 mg Oral Daily  . aspirin EC  81 mg Oral Daily  . baclofen  20 mg Oral BID  . cilostazol  50 mg Oral BID  . clopidogrel  75 mg Oral Daily  . diltiazem  120 mg Oral Daily  . enoxaparin (LOVENOX) injection  30 mg Subcutaneous Q24H  . metoprolol  25 mg Oral Daily  . pantoprazole  40 mg Oral Daily  . tiotropium  18 mcg  Inhalation Daily  . tiZANidine  2 mg Oral Q12H   And  . tiZANidine  8 mg Oral Q12H  . traZODone  100 mg Oral QHS    Continuous Infusions: . sodium chloride 100 mL/hr at 11/05/12 2220    Past Medical History  Diagnosis Date  . COPD (chronic obstructive pulmonary disease)   . Diabetes mellitus   . Atrial fibrillation   . Peripheral neuropathy   . Gallstones   . Peripheral vascular disease     Past Surgical History  Procedure Laterality Date  . Back surgery      Belenda Cruise  Dietetic Intern Pager: [phone]

## 2012-11-06 NOTE — Progress Notes (Signed)
Subjective: Since I last evaluated the patient  Admitted yesterday with nausea and vomiting. He has abdominal distention.  He tells me he had been taking Senakot to have a BM and taking Imodium to stop the diarrhea. He thinks his last BM was over a week ago. He tells be he is passing flatus. Abdominal xray reveal possible small bowel obstruction.   Objective: Vital signs in last 24 hours: Temp:  [97.1 F (36.2 C)-98.1 F (36.7 C)] 98.1 F (36.7 C) (02/21 0615) Pulse Rate:  [74-103] 99 (02/21 0615) Resp:  [16-20] 16 (02/21 0615) BP: (104-161)/(70-93) 116/71 mmHg (02/21 0615) SpO2:  [90 %-93 %] 90 % (02/21 0615) Weight:  [212 lb 4.8 oz (96.299 kg)-214 lb 6.4 oz (97.251 kg)] 213 lb 6.4 oz (96.798 kg) (02/21 0500)    Intake/Output from previous day: 02/20 0701 - 02/21 0700 In: -  Out: 600 [Urine:400; Emesis/NG output:200] Intake/Output this shift:    General appearance: alert Alert and oriented. Skin warm and dry. Oral mucosa is moist.   . Sclera anicteric, conjunctivae is pink. Thyroid not enlarged. No cervical lymphadenopathy. Crackles heart bilaterally. Heart regular rate and rhythm.  Abdomen is soft. Bowel sounds are positive. No hepatomegaly. No abdominal masses felt. No tenderness.  No edema to lower extremities. Patient is alert and oriented. Lab Results:  Recent Labs  11/05/12 1907  WBC 14.8*  HGB 17.4*  HCT 48.3  PLT 303   BMET  Recent Labs  11/05/12 1907  NA 134*  K 2.6*  CL 87*  CO2 35*  GLUCOSE 128*  BUN 41*  CREATININE 1.28  CALCIUM 8.2*   LFT  Recent Labs  11/05/12 1907  PROT 6.6  ALBUMIN 2.9*  AST 33  ALT 41  ALKPHOS 92  BILITOT 0.6   PT/INR No results found for this basename: LABPROT, INR,  in the last 72 hours Hepatitis Panel No results found for this basename: HEPBSAG, HCVAB, HEPAIGM, HEPBIGM,  in the last 72 hours C-Diff No results found for this basename: CDIFFTOX,  in the last 72 hours Fecal Lactopherrin No results found for  this basename: FECLLACTOFRN,  in the last 72 hours  Studies/Results: X-ray Chest Pa And Lateral   11/05/2012  *RADIOLOGY REPORT*  Clinical Data: Abdominal pain and distention.  Shortness of breath and weakness.  COPD.  CHEST - 2 VIEW  Comparison: 01/24/2012  Findings: Cardiomegaly.  COPD.  No active infiltrates or failure. No effusion or pneumothorax.  Osteopenia.  No visible free air.  IMPRESSION: COPD, no active disease.   Original Report Authenticated By: Davonna Belling, M.D.    Dg Abd 2 Views  11/05/2012  *RADIOLOGY REPORT*  Clinical Data: Abdominal pain and distention.  Weakness.  Diabetes.  ABDOMEN - 2 VIEW  Comparison: CT abdomen and pelvis 08/21/2012.  Findings: Distended small bowel loops with air-fluid levels concerning for a small bowel obstruction. No definite free air. There is a paucity of distal colonic gas.  Previous left hip replacement.  Heterotopic ossification surrounds the left hip.  IMPRESSION: Small bowel obstruction.  No definite free air.   Original Report Authenticated By: Davonna Belling, M.D.     Medications: I have reviewed the patient's current medications.  Assessment/Plan:  zzPossible Small bowel obstruction. Says he feels somewhat better. BS present. CT abdomen is pending. Patient to remain NPO  LOS: 1 day   SETZER,TERRI W 11/06/2012, 8:31 AM

## 2012-11-06 NOTE — Progress Notes (Signed)
Paged Dr. Karilyn Cota as requested about pt staus. Pt has vomited 4 times since 1900. Rehman asked that I speak with pt again about NG tube. Pt agreed this time. Rehman ordered ativan pre procedure and held bedtime meds due to vomiting.

## 2012-11-06 NOTE — Progress Notes (Signed)
Agree with above.  Royann Shivers MS,RD,LDN,CSG Office: [phone] Pager: [phone]

## 2012-11-06 NOTE — Progress Notes (Signed)
Subjective: He's been vomiting overnight. His x-ray looks like he may have small bowel obstruction but he says he is passing flatus. He has not had a bowel movement. His potassium was low last night and replaced intravenously  Objective: Vital signs in last 24 hours: Temp:  [97.1 F (36.2 C)-98.1 F (36.7 C)] 98.1 F (36.7 C) (02/21 0615) Pulse Rate:  [74-103] 99 (02/21 0615) Resp:  [16-20] 16 (02/21 0615) BP: (104-161)/(70-93) 116/71 mmHg (02/21 0615) SpO2:  [90 %-93 %] 90 % (02/21 0615) Weight:  [96.299 kg (212 lb 4.8 oz)-97.251 kg (214 lb 6.4 oz)] 96.798 kg (213 lb 6.4 oz) (02/21 0500) Weight change:     Intake/Output from previous day: 02/20 0701 - 02/21 0700 In: -  Out: 600 [Urine:400; Emesis/NG output:200]  PHYSICAL EXAM General appearance: alert, cooperative and mild distress Resp: rhonchi bilaterally Cardio: regular rate and rhythm, S1, S2 normal, no murmur, click, rub or gallop GI: His abdomen is distended but not as badly as last night Extremities: He has chronic changes in his extremities  Lab Results:    Basic Metabolic Panel:  Recent Labs  41/32/44 1907  NA 134*  K 2.6*  CL 87*  CO2 35*  GLUCOSE 128*  BUN 41*  CREATININE 1.28  CALCIUM 8.2*   Liver Function Tests:  Recent Labs  11/05/12 1907  AST 33  ALT 41  ALKPHOS 92  BILITOT 0.6  PROT 6.6  ALBUMIN 2.9*   No results found for this basename: LIPASE, AMYLASE,  in the last 72 hours No results found for this basename: AMMONIA,  in the last 72 hours CBC:  Recent Labs  11/05/12 1907  WBC 14.8*  NEUTROABS 11.3*  HGB 17.4*  HCT 48.3  MCV 85.2  PLT 303   Cardiac Enzymes: No results found for this basename: CKTOTAL, CKMB, CKMBINDEX, TROPONINI,  in the last 72 hours BNP: No results found for this basename: PROBNP,  in the last 72 hours D-Dimer: No results found for this basename: DDIMER,  in the last 72 hours CBG: No results found for this basename: GLUCAP,  in the last 72  hours Hemoglobin A1C: No results found for this basename: HGBA1C,  in the last 72 hours Fasting Lipid Panel: No results found for this basename: CHOL, HDL, LDLCALC, TRIG, CHOLHDL, LDLDIRECT,  in the last 72 hours Thyroid Function Tests: No results found for this basename: TSH, T4TOTAL, FREET4, T3FREE, THYROIDAB,  in the last 72 hours Anemia Panel: No results found for this basename: VITAMINB12, FOLATE, FERRITIN, TIBC, IRON, RETICCTPCT,  in the last 72 hours Coagulation: No results found for this basename: LABPROT, INR,  in the last 72 hours Urine Drug Screen: Drugs of Abuse  No results found for this basename: labopia, cocainscrnur, labbenz, amphetmu, thcu, labbarb    Alcohol Level: No results found for this basename: ETH,  in the last 72 hours Urinalysis: No results found for this basename: COLORURINE, APPERANCEUR, LABSPEC, PHURINE, GLUCOSEU, HGBUR, BILIRUBINUR, KETONESUR, PROTEINUR, UROBILINOGEN, NITRITE, LEUKOCYTESUR,  in the last 72 hours Misc. Labs:  ABGS No results found for this basename: PHART, PCO2, PO2ART, TCO2, HCO3,  in the last 72 hours CULTURES No results found for this or any previous visit (from the past 240 hour(s)). Studies/Results: X-ray Chest Pa And Lateral   11/05/2012  *RADIOLOGY REPORT*  Clinical Data: Abdominal pain and distention.  Shortness of breath and weakness.  COPD.  CHEST - 2 VIEW  Comparison: 01/24/2012  Findings: Cardiomegaly.  COPD.  No active infiltrates or failure.  No effusion or pneumothorax.  Osteopenia.  No visible free air.  IMPRESSION: COPD, no active disease.   Original Report Authenticated By: Davonna Belling, M.D.    Dg Abd 2 Views  11/05/2012  *RADIOLOGY REPORT*  Clinical Data: Abdominal pain and distention.  Weakness.  Diabetes.  ABDOMEN - 2 VIEW  Comparison: CT abdomen and pelvis 08/21/2012.  Findings: Distended small bowel loops with air-fluid levels concerning for a small bowel obstruction. No definite free air. There is a paucity of distal  colonic gas.  Previous left hip replacement.  Heterotopic ossification surrounds the left hip.  IMPRESSION: Small bowel obstruction.  No definite free air.   Original Report Authenticated By: Davonna Belling, M.D.     Medications:  Prior to Admission:  Prescriptions prior to admission  Medication Sig Dispense Refill  . allopurinol (ZYLOPRIM) 100 MG tablet Take 100 mg by mouth daily.      Marland Kitchen aspirin EC 81 MG tablet Take 81 mg by mouth daily.      . baclofen (LIORESAL) 20 MG tablet Take 20 mg by mouth 2 (two) times daily.       . cilostazol (PLETAL) 50 MG tablet Take 50 mg by mouth 2 (two) times daily.      . clopidogrel (PLAVIX) 75 MG tablet Take 75 mg by mouth daily.      Marland Kitchen diltiazem (DILACOR XR) 120 MG 24 hr capsule Take 120 mg by mouth daily.      . diphenoxylate-atropine (LOMOTIL) 2.5-0.025 MG per tablet       . Folic Acid-Vit B6-Vit B12 (FOLBEE) 2.5-25-1 MG TABS Take 1 tablet by mouth daily.      . furosemide (LASIX) 40 MG tablet Take 40 mg by mouth daily.      Marland Kitchen glipiZIDE (GLUCOTROL XL) 2.5 MG 24 hr tablet Take 2.5 mg by mouth daily.      Marland Kitchen HYDROcodone-acetaminophen (NORCO) 10-325 MG per tablet Take 1 tablet by mouth 2 (two) times daily as needed. For pain      . metoprolol (LOPRESSOR) 50 MG tablet Take 25 mg by mouth daily.       . pantoprazole (PROTONIX) 40 MG tablet Take 40 mg by mouth daily.      . ramipril (ALTACE) 2.5 MG capsule Take 2.5 mg by mouth daily.      . simvastatin (ZOCOR) 20 MG tablet Take 20 mg by mouth every evening.      Marland Kitchen SPIRIVA HANDIHALER 18 MCG inhalation capsule Place 18 mcg into inhaler and inhale daily.       Marland Kitchen tiZANidine (ZANAFLEX) 4 MG tablet Take 2-8 mg by mouth 4 (four) times daily. Patient takes 8mg  at 11am and 8mg  at 11pm and 2mg  twice a day      . traZODone (DESYREL) 50 MG tablet Take 100 mg by mouth at bedtime.      Marland Kitchen zolpidem (AMBIEN CR) 12.5 MG CR tablet Take 12.5 mg by mouth at bedtime. For sleep       Scheduled: . allopurinol  100 mg Oral Daily  .  aspirin EC  81 mg Oral Daily  . baclofen  20 mg Oral BID  . cilostazol  50 mg Oral BID  . clopidogrel  75 mg Oral Daily  . diltiazem  120 mg Oral Daily  . enoxaparin (LOVENOX) injection  30 mg Subcutaneous Q24H  . metoprolol  25 mg Oral Daily  . pantoprazole  40 mg Oral Daily  . tiotropium  18 mcg Inhalation Daily  . tiZANidine  2 mg Oral Q12H   And  . tiZANidine  8 mg Oral Q12H  . traZODone  100 mg Oral QHS   Continuous: . sodium chloride 100 mL/hr at 11/05/12 2220   UXL:[id number], acetaminophen, albuterol, HYDROcodone-acetaminophen, ondansetron (ZOFRAN) IV, ondansetron, zolpidem  Assesment: He has what may be small bowel obstruction. He has COPD. He has a history of coronary artery occlusive disease which is stable. 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 *    Plan: He will have CT of the abdomen. This is to try to define whether he has small bowel obstruction or ileus. His potassium was low and has been replaced and that will be rechecked    LOS: 1 day   Mendel Binsfeld L 11/06/2012, 8:45 AM

## 2012-11-06 NOTE — Progress Notes (Signed)
Patient throwing up thin black fluid, administered zofran IV and vomiting resolved for now. If continuous vomiting, will page MD

## 2012-11-06 NOTE — Progress Notes (Signed)
UR Chart Review Completed  

## 2012-11-06 NOTE — Progress Notes (Signed)
Further order given by Dr. Maceo Pro and patient now NPO and to receive 4 runs of potassium. However patient complains of IV soreness and fluids running too fast so i had to slow the rate down in order for patient to tolerate the runs.

## 2012-11-07 LAB — BASIC METABOLIC PANEL
BUN: 29 mg/dL — ABNORMAL HIGH (ref 6–23)
Calcium: 7.8 mg/dL — ABNORMAL LOW (ref 8.4–10.5)
Chloride: 95 mEq/L — ABNORMAL LOW (ref 96–112)
Creatinine, Ser: 1.17 mg/dL (ref 0.50–1.35)
GFR calc Af Amer: 70 mL/min — ABNORMAL LOW (ref >90)
GFR calc non Af Amer: 60 mL/min — ABNORMAL LOW (ref >90)

## 2012-11-07 LAB — URINE CULTURE

## 2012-11-07 MED ORDER — POTASSIUM CHLORIDE 10 MEQ/100ML IV SOLN
10.0000 meq | INTRAVENOUS | Status: DC
  Administered 2012-11-07: 10 meq via INTRAVENOUS
  Filled 2012-11-07: qty 100

## 2012-11-07 MED ORDER — POTASSIUM CHLORIDE 10 MEQ/100ML IV SOLN
10.0000 meq | INTRAVENOUS | Status: AC
  Administered 2012-11-07 (×4): 10 meq via INTRAVENOUS
  Filled 2012-11-07 (×4): qty 100

## 2012-11-07 MED ORDER — LORAZEPAM 2 MG/ML IJ SOLN
1.0000 mg | INTRAMUSCULAR | Status: DC | PRN
  Administered 2012-11-07: 1 mg via INTRAVENOUS
  Filled 2012-11-07: qty 1

## 2012-11-07 MED ORDER — DEXTROSE 5 % IV SOLN
INTRAVENOUS | Status: AC
  Filled 2012-11-07: qty 10

## 2012-11-07 MED ORDER — POTASSIUM CHLORIDE 10 MEQ/100ML IV SOLN
INTRAVENOUS | Status: AC
  Administered 2012-11-07: 17:00:00
  Filled 2012-11-07: qty 100

## 2012-11-07 MED ORDER — DEXTROSE 5 % IV SOLN
1.0000 g | INTRAVENOUS | Status: DC
  Administered 2012-11-07 – 2012-11-12 (×4): 1 g via INTRAVENOUS
  Filled 2012-11-07 (×6): qty 10

## 2012-11-07 MED ORDER — POTASSIUM CHLORIDE 10 MEQ/100ML IV SOLN
INTRAVENOUS | Status: AC
  Filled 2012-11-07: qty 100

## 2012-11-07 NOTE — Progress Notes (Signed)
Urinalysis results returned and client is positive for E.coli in urine. Contacted MD on call for Dr. Juanetta Gosling, which is Dr.Knowlton. Advised MD of UA results and of clients drug allergies. Per MD request this nurse reviewed reactions with client when he takes PCN. Patient states he gets hive/welps. MD advised and ordered Rocephin 1gram IV q 24hours x 10 days. Order entered.

## 2012-11-07 NOTE — Progress Notes (Signed)
Subjective: He feels better since he had nasogastric tube placed. He has less abdominal discomfort. He is still passing flatus. He is not as nauseated.  Objective: Vital signs in last 24 hours: Temp:  [97.9 F (36.6 C)-98.2 F (36.8 C)] 98 F (36.7 C) (02/22 0601) Pulse Rate:  [75-105] 105 (02/22 0601) Resp:  [17-19] 17 (02/22 0601) BP: (112-156)/(71-78) 132/78 mmHg (02/22 0601) SpO2:  [85 %-94 %] 94 % (02/22 0732) Weight:  [97.297 kg (214 lb 8 oz)] 97.297 kg (214 lb 8 oz) (02/22 0500) Weight change: 0.045 kg (1.6 oz) Last BM Date: 11/06/12 (small)  Intake/Output from previous day: 02/21 0701 - 02/22 0700 In: -  Out: 1575 [Urine:575; Emesis/NG output:1000]  PHYSICAL EXAM General appearance: alert, cooperative and mild distress Resp: clear to auscultation bilaterally Cardio: regular rate and rhythm, S1, S2 normal, no murmur, click, rub or gallop GI: His abdomen is still somewhat distended but less than yesterday and much softer. Bowel sounds are reduced Extremities: extremities normal, atraumatic, no cyanosis or edema  Lab Results:    Basic Metabolic Panel:  Recent Labs  32/44/01 0907 11/07/12 0438  NA 138 142  K 2.9* 2.6*  CL 93* 95*  CO2 33* 31  GLUCOSE 89 71  BUN 36* 29*  CREATININE 1.13 1.17  CALCIUM 7.8* 7.8*   Liver Function Tests:  Recent Labs  11/05/12 1907  AST 33  ALT 41  ALKPHOS 92  BILITOT 0.6  PROT 6.6  ALBUMIN 2.9*   No results found for this basename: LIPASE, AMYLASE,  in the last 72 hours No results found for this basename: AMMONIA,  in the last 72 hours CBC:  Recent Labs  11/05/12 1907  WBC 14.8*  NEUTROABS 11.3*  HGB 17.4*  HCT 48.3  MCV 85.2  PLT 303   Cardiac Enzymes: No results found for this basename: CKTOTAL, CKMB, CKMBINDEX, TROPONINI,  in the last 72 hours BNP: No results found for this basename: PROBNP,  in the last 72 hours D-Dimer: No results found for this basename: DDIMER,  in the last 72 hours CBG:  Recent  Labs  11/06/12 2010  GLUCAP 98   Hemoglobin A1C: No results found for this basename: HGBA1C,  in the last 72 hours Fasting Lipid Panel: No results found for this basename: CHOL, HDL, LDLCALC, TRIG, CHOLHDL, LDLDIRECT,  in the last 72 hours Thyroid Function Tests: No results found for this basename: TSH, T4TOTAL, FREET4, T3FREE, THYROIDAB,  in the last 72 hours Anemia Panel: No results found for this basename: VITAMINB12, FOLATE, FERRITIN, TIBC, IRON, RETICCTPCT,  in the last 72 hours Coagulation: No results found for this basename: LABPROT, INR,  in the last 72 hours Urine Drug Screen: Drugs of Abuse  No results found for this basename: labopia, cocainscrnur, labbenz, amphetmu, thcu, labbarb    Alcohol Level: No results found for this basename: ETH,  in the last 72 hours Urinalysis: No results found for this basename: COLORURINE, APPERANCEUR, LABSPEC, PHURINE, GLUCOSEU, HGBUR, BILIRUBINUR, KETONESUR, PROTEINUR, UROBILINOGEN, NITRITE, LEUKOCYTESUR,  in the last 72 hours Misc. Labs:  ABGS No results found for this basename: PHART, PCO2, PO2ART, TCO2, HCO3,  in the last 72 hours CULTURES Recent Results (from the past 240 hour(s))  URINE CULTURE     Status: None   Collection Time    11/05/12 10:20 PM      Result Value Range Status   Specimen Description URINE, RANDOM   Final   Special Requests none   Final   Culture  Setup Time 11/05/2012 22:30   Final   Colony Count >=100,000 COLONIES/ML   Final   Culture ESCHERICHIA COLI   Final   Report Status PENDING   Incomplete  CLOSTRIDIUM DIFFICILE BY PCR     Status: None   Collection Time    11/06/12  4:54 PM      Result Value Range Status   C difficile by pcr NEGATIVE  NEGATIVE Final   Studies/Results: X-ray Chest Pa And Lateral   11/05/2012  *RADIOLOGY REPORT*  Clinical Data: Abdominal pain and distention.  Shortness of breath and weakness.  COPD.  CHEST - 2 VIEW  Comparison: 01/24/2012  Findings: Cardiomegaly.  COPD.  No active  infiltrates or failure. No effusion or pneumothorax.  Osteopenia.  No visible free air.  IMPRESSION: COPD, no active disease.   Original Report Authenticated By: Davonna Belling, M.D.    Ct Abdomen Pelvis W Contrast  11/06/2012  *RADIOLOGY REPORT*  Clinical Data: Abdominal pain and diarrhea.  CT ABDOMEN AND PELVIS WITH CONTRAST  Technique:  Multidetector CT imaging of the abdomen and pelvis was performed following the standard protocol during bolus administration of intravenous contrast.  Contrast:  100 ml Omnipaque-300.  Comparison: Plain films of the abdomen 11/05/2012 and CT abdomen and pelvis 08/21/2012.  Findings: Mild dependent atelectasis present in the lung bases.  No pleural or pericardial effusion.  Heart size is normal. Atherosclerotic vascular disease is noted.  As seen on plain films, the patient has a small bowel obstruction with dilated loops of small bowel measuring up to 4.5 cm in diameter and multiple air-fluid levels identified.  The transition point appears to be in the left lower quadrant where there is an angulated loop of small bowel.  There is no pneumatosis, free intraperitoneal air, portal venous gas or focal fluid collection. No mass is identified.  The patient has extensive diverticular disease of the colon, worse in the sigmoid without diverticulitis. The colon is otherwise unremarkable.  There is no lymphadenopathy.  A few small stones are seen within the gallbladder without evidence of cholecystitis.  There is a small amount of perihepatic ascites. There is unchanged 0.9 cm lesion in the dome of the liver in the left hepatic lobe on image 15.  The liver otherwise appears normal. The spleen, adrenal glands and pancreas are unremarkable.  Single bilateral renal cysts are unchanged.  No focal bony abnormality is identified.  The patient is status post left hip replacement.  IMPRESSION:  1.  Study is positive for small bowel obstruction which appears to be due to adhesions.  There is no  evidence of bowel ischemia. Small amount of associated perihepatic ascites is noted. 2.  Diverticulosis without diverticulitis. 3.  Small gallstones without evidence of cholecystitis.   Original Report Authenticated By: Holley Dexter, M.D.    Dg Abd 2 Views  11/05/2012  *RADIOLOGY REPORT*  Clinical Data: Abdominal pain and distention.  Weakness.  Diabetes.  ABDOMEN - 2 VIEW  Comparison: CT abdomen and pelvis 08/21/2012.  Findings: Distended small bowel loops with air-fluid levels concerning for a small bowel obstruction. No definite free air. There is a paucity of distal colonic gas.  Previous left hip replacement.  Heterotopic ossification surrounds the left hip.  IMPRESSION: Small bowel obstruction.  No definite free air.   Original Report Authenticated By: Davonna Belling, M.D.     Medications:  Prior to Admission:  Prescriptions prior to admission  Medication Sig Dispense Refill  . allopurinol (ZYLOPRIM) 100 MG tablet Take 100  mg by mouth daily.      Marland Kitchen aspirin EC 81 MG tablet Take 81 mg by mouth daily.      . baclofen (LIORESAL) 20 MG tablet Take 20 mg by mouth 2 (two) times daily.       . cilostazol (PLETAL) 50 MG tablet Take 50 mg by mouth 2 (two) times daily.      . clopidogrel (PLAVIX) 75 MG tablet Take 75 mg by mouth daily.      Marland Kitchen diltiazem (DILACOR XR) 120 MG 24 hr capsule Take 120 mg by mouth daily.      . diphenoxylate-atropine (LOMOTIL) 2.5-0.025 MG per tablet       . Folic Acid-Vit B6-Vit B12 (FOLBEE) 2.5-25-1 MG TABS Take 1 tablet by mouth daily.      . furosemide (LASIX) 40 MG tablet Take 40 mg by mouth daily.      Marland Kitchen glipiZIDE (GLUCOTROL XL) 2.5 MG 24 hr tablet Take 2.5 mg by mouth daily.      Marland Kitchen HYDROcodone-acetaminophen (NORCO) 10-325 MG per tablet Take 1 tablet by mouth 2 (two) times daily as needed. For pain      . metoprolol (LOPRESSOR) 50 MG tablet Take 25 mg by mouth daily.       . pantoprazole (PROTONIX) 40 MG tablet Take 40 mg by mouth daily.      . ramipril (ALTACE) 2.5  MG capsule Take 2.5 mg by mouth daily.      . simvastatin (ZOCOR) 20 MG tablet Take 20 mg by mouth every evening.      Marland Kitchen SPIRIVA HANDIHALER 18 MCG inhalation capsule Place 18 mcg into inhaler and inhale daily.       Marland Kitchen tiZANidine (ZANAFLEX) 4 MG tablet Take 2-8 mg by mouth 4 (four) times daily. Patient takes 8mg  at 11am and 8mg  at 11pm and 2mg  twice a day      . traZODone (DESYREL) 50 MG tablet Take 100 mg by mouth at bedtime.      Marland Kitchen zolpidem (AMBIEN CR) 12.5 MG CR tablet Take 12.5 mg by mouth at bedtime. For sleep       Scheduled: . allopurinol  100 mg Oral Daily  . aspirin EC  81 mg Oral Daily  . baclofen  20 mg Oral BID  . cilostazol  50 mg Oral BID  . clopidogrel  75 mg Oral Daily  . diltiazem  120 mg Oral Daily  . enoxaparin (LOVENOX) injection  40 mg Subcutaneous Q24H  . metoprolol  25 mg Oral Daily  . pantoprazole  40 mg Oral Daily  . potassium chloride  10 mEq Intravenous Q1 Hr x 6  . tiotropium  18 mcg Inhalation Daily  . tiZANidine  2 mg Oral Q12H   And  . tiZANidine  8 mg Oral Q12H  . traZODone  100 mg Oral QHS   Continuous: . sodium chloride 100 mL/hr at 11/07/12 1914   NWG:[id number], acetaminophen, albuterol, HYDROcodone-acetaminophen, LORazepam, morphine injection, ondansetron (ZOFRAN) IV, ondansetron, zolpidem  Assesment: He has partial small bowel obstruction. He has improved since he has had a nasogastric tube. He has coronary artery occlusive disease which is stable. He has COPD which is also stable he has peripheral arterial disease unchanged. He has been dehydrated. He has some renal insufficiency. He has hypokalemia and is being replaced Active Problems:   DIABETES, TYPE 2   CAD   PERIPHERAL VASCULAR DISEASE   C O P D   RENAL INSUFFICIENCY   Peripheral neuropathy  Alternating constipation and diarrhea   Dehydration    Plan: Continue NG drainage replace his potassium continue his other treatments.    LOS: 2 days   Shahiem Bedwell L 11/07/2012,  9:33 AM

## 2012-11-07 NOTE — Progress Notes (Signed)
CRITICAL VALUE ALERT  Critical value received:  Potassium 2.6  Date of notification:  11/07/2012  Time of notification:  0635  Critical value read back:yes  Nurse who received alert:  Loney Laurence RN  MD notified (1st page): Sudie Bailey  Time of first page: 0636  MD notified (2nd page):  Time of second page:  Responding MD:   Time MD responded:

## 2012-11-08 DIAGNOSIS — K56609 Unspecified intestinal obstruction, unspecified as to partial versus complete obstruction: Secondary | ICD-10-CM

## 2012-11-08 DIAGNOSIS — K625 Hemorrhage of anus and rectum: Secondary | ICD-10-CM

## 2012-11-08 LAB — CBC WITH DIFFERENTIAL/PLATELET
Basophils Relative: 0 % (ref 0–1)
Eosinophils Absolute: 0.2 10*3/uL (ref 0.0–0.7)
Eosinophils Relative: 1 % (ref 0–5)
Eosinophils Relative: 0 % (ref 0–5)
Lymphocytes Relative: 12 % (ref 12–46)
Lymphs Abs: 2.4 10*3/uL (ref 0.7–4.0)
Lymphs Abs: 2.3 10*3/uL (ref 0.7–4.0)
MCH: 29.9 pg (ref 26.0–34.0)
MCHC: 34.1 g/dL (ref 30.0–36.0)
MCV: 87.5 fL (ref 78.0–100.0)
MCV: 88.2 fL (ref 78.0–100.0)
Neutrophils Relative %: 75 % (ref 43–77)
Platelets: 286 10*3/uL (ref 150–400)
Platelets: 305 10*3/uL (ref 150–400)
RBC: 5.22 MIL/uL (ref 4.22–5.81)
RBC: 4.9 MIL/uL (ref 4.22–5.81)
RDW: 13.5 % (ref 11.5–15.5)
WBC: 19.1 10*3/uL — ABNORMAL HIGH (ref 4.0–10.5)

## 2012-11-08 LAB — BASIC METABOLIC PANEL
BUN: 20 mg/dL (ref 6–23)
CO2: 28 mEq/L (ref 19–32)
Calcium: 8 mg/dL — ABNORMAL LOW (ref 8.4–10.5)
Creatinine, Ser: 0.85 mg/dL (ref 0.50–1.35)
GFR calc non Af Amer: 84 mL/min — ABNORMAL LOW (ref >90)
Glucose, Bld: 61 mg/dL — ABNORMAL LOW (ref 70–99)
Sodium: 140 mEq/L (ref 135–145)

## 2012-11-08 MED ORDER — HYDROCORTISONE 2.5 % RE CREA
TOPICAL_CREAM | Freq: Four times a day (QID) | RECTAL | Status: DC
  Administered 2012-11-08 – 2012-11-12 (×14): via RECTAL
  Filled 2012-11-08 (×2): qty 28.35

## 2012-11-08 MED ORDER — BIOTENE DRY MOUTH MT LIQD
15.0000 mL | Freq: Two times a day (BID) | OROMUCOSAL | Status: DC
  Administered 2012-11-09 – 2012-11-11 (×6): 15 mL via OROMUCOSAL

## 2012-11-08 MED ORDER — POTASSIUM CHLORIDE 10 MEQ/100ML IV SOLN
10.0000 meq | INTRAVENOUS | Status: AC
  Administered 2012-11-08 (×5): 10 meq via INTRAVENOUS
  Filled 2012-11-08 (×4): qty 100

## 2012-11-08 MED ORDER — POTASSIUM CHLORIDE 10 MEQ/100ML IV SOLN
INTRAVENOUS | Status: AC
  Filled 2012-11-08: qty 100

## 2012-11-08 MED ORDER — POTASSIUM CHLORIDE 10 MEQ/100ML IV SOLN
INTRAVENOUS | Status: AC
  Administered 2012-11-08: 10 meq via INTRAVENOUS
  Filled 2012-11-08: qty 100

## 2012-11-08 MED ORDER — CHLORHEXIDINE GLUCONATE 0.12 % MT SOLN
15.0000 mL | Freq: Two times a day (BID) | OROMUCOSAL | Status: DC
  Administered 2012-11-09 – 2012-11-10 (×2): 15 mL via OROMUCOSAL
  Filled 2012-11-08 (×3): qty 15

## 2012-11-08 MED ORDER — ASPIRIN EC 81 MG PO TBEC: 81.0000 mg | DELAYED_RELEASE_TABLET | Freq: Every day | ORAL | Status: DC

## 2012-11-08 MED ORDER — CLOPIDOGREL BISULFATE 75 MG PO TABS: 75.0000 mg | ORAL_TABLET | Freq: Every day | ORAL | Status: DC

## 2012-11-08 NOTE — Progress Notes (Signed)
Subjective: PT ADMITTED WITH SMALL BOWEL OBSTRUCTION. SX IMPROVED WITH NG TUBE. NG CLAMPED AND NOW ABDOMEN MORE DISTENDED ACCORDING TO  NURSING. PT HAD URGE TO HAVE BM AND BRBPR/RED JELLY LIKE DISCHARGE FROM RECTUM PT STATES THE LAST TIME HE HAD THIS PROBLEM WAS WHEN HE HAD HIS TCS 2 YRS AGO. PT TAKING ASA/PLAVIX/LOVENOX SQ.  Objective: Vital signs in last 24 hours: Temp:  [97.7 F (36.5 C)-98.1 F (36.7 C)] 97.7 F (36.5 C) (02/23 0500) Pulse Rate:  [106-117] 109 (02/23 0500) Resp:  [16-20] 16 (02/23 0500) BP: (154-163)/(86) 163/86 mmHg (02/23 0500) SpO2:  [89 %-95 %] 89 % (02/23 0804) Weight:  [214 lb 12.8 oz (97.433 kg)] 214 lb 12.8 oz (97.433 kg) (02/23 0440) Last BM Date: 11/06/12  Intake/Output from previous day: 02/22 0701 - 02/23 0700 In: 6255.1 [I.V.:5625.1; NG/GT:130; IV Piggyback:500] Out: 3425 [Urine:900; Emesis/NG output:2525] Intake/Output this shift: Total I/O In: -  Out: 100 [Urine:100]  General appearance: alert, cooperative and no distress Resp: clear to auscultation bilaterally Cardio: regular rate and rhythm GI: soft, non-tender; DISTENDED; bowel sounds normal;   Lab Results:  Recent Labs  11/05/12 1907 11/08/12 0444  WBC 14.8* 16.0*  HGB 17.4* 15.6  HCT 48.3 45.7  PLT 303 286   BMET  Recent Labs  11/06/12 0907 11/07/12 0438 11/08/12 0444  NA 138 142 140  K 2.9* 2.6* 2.9*  CL 93* 95* 97  CO2 33* 31 28  GLUCOSE 89 71 61*  BUN 36* 29* 20  CREATININE 1.13 1.17 0.85  CALCIUM 7.8* 7.8* 8.0*   LFT  Recent Labs  11/05/12 1907  PROT 6.6  ALBUMIN 2.9*  AST 33  ALT 41  ALKPHOS 92  BILITOT 0.6   PT/INR No results found for this basename: LABPROT, INR,  in the last 72 hours Hepatitis Panel No results found for this basename: HEPBSAG, HCVAB, HEPAIGM, HEPBIGM,  in the last 72 hours C-Diff No results found for this basename: CDIFFTOX,  in the last 72 hours Fecal Lactopherrin No results found for this basename: FECLLACTOFRN,  in the last  72 hours  Studies/Results: Ct Abdomen Pelvis W Contrast  11/06/2012  *RADIOLOGY REPORT*  Clinical Data: Abdominal pain and diarrhea.  CT ABDOMEN AND PELVIS WITH CONTRAST  Technique:  Multidetector CT imaging of the abdomen and pelvis was performed following the standard protocol during bolus administration of intravenous contrast.  Contrast:  100 ml Omnipaque-300.  Comparison: Plain films of the abdomen 11/05/2012 and CT abdomen and pelvis 08/21/2012.  Findings: Mild dependent atelectasis present in the lung bases.  No pleural or pericardial effusion.  Heart size is normal. Atherosclerotic vascular disease is noted.  As seen on plain films, the patient has a small bowel obstruction with dilated loops of small bowel measuring up to 4.5 cm in diameter and multiple air-fluid levels identified.  The transition point appears to be in the left lower quadrant where there is an angulated loop of small bowel.  There is no pneumatosis, free intraperitoneal air, portal venous gas or focal fluid collection. No mass is identified.  The patient has extensive diverticular disease of the colon, worse in the sigmoid without diverticulitis. The colon is otherwise unremarkable.  There is no lymphadenopathy.  A few small stones are seen within the gallbladder without evidence of cholecystitis.  There is a small amount of perihepatic ascites. There is unchanged 0.9 cm lesion in the dome of the liver in the left hepatic lobe on image 15.  The liver otherwise appears normal.  The spleen, adrenal glands and pancreas are unremarkable.  Single bilateral renal cysts are unchanged.  No focal bony abnormality is identified.  The patient is status post left hip replacement.  IMPRESSION:  1.  Study is positive for small bowel obstruction which appears to be due to adhesions.  There is no evidence of bowel ischemia. Small amount of associated perihepatic ascites is noted. 2.  Diverticulosis without diverticulitis. 3.  Small gallstones without  evidence of cholecystitis.   Original Report Authenticated By: Holley Dexter, M.D.     Medications: I have reviewed the patient's current medications.  Assessment/Plan: SMALL BOWEL OBSTRUCTION-PT NOT TOLERATING TRIAL OF CLAMPING [place]. REQUESTING BG BE HOOKED UP TO SUCTION. ONE EPISODE OF BRBPR MOST LIKELY DUE TO HEMORRHOIDS, LESS LIKELY ISCHEMIC COLITIS.  PLAN: 1. CBC AT 1900, ANUSOL CREAM, MONITOR STOOLS. HOLD ASA/PLAVIX ON 2/24. STOP LOVENOX. ADD BIL SCDs. DISCUSSED PLAN WITH PT'S WIFE. 2. NG TUBE TO LOW WALL SUCTION 3. DISCUSSED LIKELY NEED FOR SURGERY CONSULT. PT WILL DISCUSS WITH WIFE.    LOS: 3 days   Sandi Fields 11/08/2012, 12:11 PM

## 2012-11-08 NOTE — Progress Notes (Signed)
Subjective: He says he feels better. He has less abdominal pain and is less distended  Objective: Vital signs in last 24 hours: Temp:  [97.3 F (36.3 C)-97.9 F (36.6 C)] 97.3 F (36.3 C) (02/23 1346) Pulse Rate:  [108-117] 108 (02/23 1346) Resp:  [16-20] 18 (02/23 1346) BP: (148-163)/(86-99) 148/99 mmHg (02/23 1346) SpO2:  [89 %-96 %] 96 % (02/23 1346) Weight:  [97.433 kg (214 lb 12.8 oz)] 97.433 kg (214 lb 12.8 oz) (02/23 0440) Weight change: 0.136 kg (4.8 oz) Last BM Date: 11/06/12  Intake/Output from previous day: 02/22 0701 - 02/23 0700 In: 6255.1 [I.V.:5625.1; NG/GT:130; IV Piggyback:500] Out: 3425 [Urine:900; Emesis/NG output:2525]  PHYSICAL EXAM General appearance: alert, cooperative and mild distress Resp: rhonchi bilaterally Cardio: irregularly irregular rhythm GI: soft, non-tender; bowel sounds normal; no masses,  no organomegaly Extremities: extremities normal, atraumatic, no cyanosis or edema  Lab Results:    Basic Metabolic Panel:  Recent Labs  16/10/96 0438 11/08/12 0444  NA 142 140  K 2.6* 2.9*  CL 95* 97  CO2 31 28  GLUCOSE 71 61*  BUN 29* 20  CREATININE 1.17 0.85  CALCIUM 7.8* 8.0*   Liver Function Tests:  Recent Labs  11/05/12 1907  AST 33  ALT 41  ALKPHOS 92  BILITOT 0.6  PROT 6.6  ALBUMIN 2.9*   No results found for this basename: LIPASE, AMYLASE,  in the last 72 hours No results found for this basename: AMMONIA,  in the last 72 hours CBC:  Recent Labs  11/05/12 1907 11/08/12 0444  WBC 14.8* 16.0*  NEUTROABS 11.3* 12.0*  HGB 17.4* 15.6  HCT 48.3 45.7  MCV 85.2 87.5  PLT 303 286   Cardiac Enzymes: No results found for this basename: CKTOTAL, CKMB, CKMBINDEX, TROPONINI,  in the last 72 hours BNP: No results found for this basename: PROBNP,  in the last 72 hours D-Dimer: No results found for this basename: DDIMER,  in the last 72 hours CBG:  Recent Labs  11/06/12 2010  GLUCAP 98   Hemoglobin A1C: No results  found for this basename: HGBA1C,  in the last 72 hours Fasting Lipid Panel: No results found for this basename: CHOL, HDL, LDLCALC, TRIG, CHOLHDL, LDLDIRECT,  in the last 72 hours Thyroid Function Tests: No results found for this basename: TSH, T4TOTAL, FREET4, T3FREE, THYROIDAB,  in the last 72 hours Anemia Panel: No results found for this basename: VITAMINB12, FOLATE, FERRITIN, TIBC, IRON, RETICCTPCT,  in the last 72 hours Coagulation: No results found for this basename: LABPROT, INR,  in the last 72 hours Urine Drug Screen: Drugs of Abuse  No results found for this basename: labopia, cocainscrnur, labbenz, amphetmu, thcu, labbarb    Alcohol Level: No results found for this basename: ETH,  in the last 72 hours Urinalysis: No results found for this basename: COLORURINE, APPERANCEUR, LABSPEC, PHURINE, GLUCOSEU, HGBUR, BILIRUBINUR, KETONESUR, PROTEINUR, UROBILINOGEN, NITRITE, LEUKOCYTESUR,  in the last 72 hours Misc. Labs:  ABGS No results found for this basename: PHART, PCO2, PO2ART, TCO2, HCO3,  in the last 72 hours CULTURES Recent Results (from the past 240 hour(s))  URINE CULTURE     Status: None   Collection Time    11/05/12 10:20 PM      Result Value Range Status   Specimen Description URINE, RANDOM   Final   Special Requests none   Final   Culture  Setup Time 11/05/2012 22:30   Final   Colony Count >=100,000 COLONIES/ML   Final   Culture  ESCHERICHIA COLI   Final   Report Status 11/07/2012 FINAL   Final   Organism ID, Bacteria ESCHERICHIA COLI   Final  CLOSTRIDIUM DIFFICILE BY PCR     Status: None   Collection Time    11/06/12  4:54 PM      Result Value Range Status   C difficile by pcr NEGATIVE  NEGATIVE Final   Studies/Results: No results found.  Medications:  Prior to Admission:  Prescriptions prior to admission  Medication Sig Dispense Refill  . allopurinol (ZYLOPRIM) 100 MG tablet Take 100 mg by mouth daily.      Marland Kitchen aspirin EC 81 MG tablet Take 81 mg by mouth  daily.      . baclofen (LIORESAL) 20 MG tablet Take 20 mg by mouth 2 (two) times daily.       . cilostazol (PLETAL) 50 MG tablet Take 50 mg by mouth 2 (two) times daily.      . clopidogrel (PLAVIX) 75 MG tablet Take 75 mg by mouth daily.      Marland Kitchen diltiazem (DILACOR XR) 120 MG 24 hr capsule Take 120 mg by mouth daily.      . diphenoxylate-atropine (LOMOTIL) 2.5-0.025 MG per tablet       . Folic Acid-Vit B6-Vit B12 (FOLBEE) 2.5-25-1 MG TABS Take 1 tablet by mouth daily.      . furosemide (LASIX) 40 MG tablet Take 40 mg by mouth daily.      Marland Kitchen glipiZIDE (GLUCOTROL XL) 2.5 MG 24 hr tablet Take 2.5 mg by mouth daily.      Marland Kitchen HYDROcodone-acetaminophen (NORCO) 10-325 MG per tablet Take 1 tablet by mouth 2 (two) times daily as needed. For pain      . metoprolol (LOPRESSOR) 50 MG tablet Take 25 mg by mouth daily.       . pantoprazole (PROTONIX) 40 MG tablet Take 40 mg by mouth daily.      . ramipril (ALTACE) 2.5 MG capsule Take 2.5 mg by mouth daily.      . simvastatin (ZOCOR) 20 MG tablet Take 20 mg by mouth every evening.      Marland Kitchen SPIRIVA HANDIHALER 18 MCG inhalation capsule Place 18 mcg into inhaler and inhale daily.       Marland Kitchen tiZANidine (ZANAFLEX) 4 MG tablet Take 2-8 mg by mouth 4 (four) times daily. Patient takes 8mg  at 11am and 8mg  at 11pm and 2mg  twice a day      . traZODone (DESYREL) 50 MG tablet Take 100 mg by mouth at bedtime.      Marland Kitchen zolpidem (AMBIEN CR) 12.5 MG CR tablet Take 12.5 mg by mouth at bedtime. For sleep       Scheduled: . allopurinol  100 mg Oral Daily  . [START ON 11/10/2012] aspirin EC  81 mg Oral Daily  . baclofen  20 mg Oral BID  . cefTRIAXone (ROCEPHIN) IVPB 1 gram/50 mL D5W  1 g Intravenous Q24H  . cilostazol  50 mg Oral BID  . [START ON 11/10/2012] clopidogrel  75 mg Oral Daily  . diltiazem  120 mg Oral Daily  . hydrocortisone   Rectal QID  . metoprolol  25 mg Oral Daily  . pantoprazole  40 mg Oral Daily  . [COMPLETED] potassium chloride  10 mEq Intravenous Q1 Hr x 6  .  tiotropium  18 mcg Inhalation Daily  . tiZANidine  2 mg Oral Q12H   And  . tiZANidine  8 mg Oral Q12H  . traZODone  100  mg Oral QHS   Continuous: . sodium chloride 100 mL/hr at 11/08/12 1313   ZOX:[id number], acetaminophen, albuterol, HYDROcodone-acetaminophen, LORazepam, morphine injection, ondansetron (ZOFRAN) IV, ondansetron, zolpidem  Assesment:he has partial small bowel obstruction. He seems better. He has multiple other medical problems as listed below which are stable  Principal Problem:   Partial small bowel obstruction Active Problems:   DIABETES, TYPE 2   CAD   PERIPHERAL VASCULAR DISEASE   C O P D   RENAL INSUFFICIENCY   Peripheral neuropathy   Alternating constipation and diarrhea   Dehydration   Hypokalemia    Plan:try to clamp nasogastric tube and see how he  does    LOS: 3 days   Judythe Postema L 11/08/2012, 6:58 PM

## 2012-11-08 NOTE — Progress Notes (Signed)
Subjective: He says he feels better. He has been up walking. He denies any abdominal pain now. He has not had a stool but he is still passing flatus  Objective: Vital signs in last 24 hours: Temp:  [97.7 F (36.5 C)-98.1 F (36.7 C)] 97.7 F (36.5 C) (02/23 0500) Pulse Rate:  [106-117] 109 (02/23 0500) Resp:  [16-20] 16 (02/23 0500) BP: (154-163)/(86) 163/86 mmHg (02/23 0500) SpO2:  [89 %-95 %] 89 % (02/23 0804) Weight:  [97.433 kg (214 lb 12.8 oz)] 97.433 kg (214 lb 12.8 oz) (02/23 0440) Weight change: 0.136 kg (4.8 oz) Last BM Date: 11/06/12  Intake/Output from previous day: 02/22 0701 - 02/23 0700 In: 6255.1 [I.V.:5625.1; NG/GT:130; IV Piggyback:500] Out: 3425 [Urine:900; Emesis/NG output:2525]  PHYSICAL EXAM General appearance: alert, cooperative and mild distress Resp: rhonchi bilaterally Cardio: irregularly irregular rhythm GI: Nasogastric tube is in place but is not on suction right now. He is mildly tender and has hypoactive bowel sounds Extremities: extremities normal, atraumatic, no cyanosis or edema  Lab Results:    Basic Metabolic Panel:  Recent Labs  16/10/96 0438 11/08/12 0444  NA 142 140  K 2.6* 2.9*  CL 95* 97  CO2 31 28  GLUCOSE 71 61*  BUN 29* 20  CREATININE 1.17 0.85  CALCIUM 7.8* 8.0*   Liver Function Tests:  Recent Labs  11/05/12 1907  AST 33  ALT 41  ALKPHOS 92  BILITOT 0.6  PROT 6.6  ALBUMIN 2.9*   No results found for this basename: LIPASE, AMYLASE,  in the last 72 hours No results found for this basename: AMMONIA,  in the last 72 hours CBC:  Recent Labs  11/05/12 1907 11/08/12 0444  WBC 14.8* 16.0*  NEUTROABS 11.3* 12.0*  HGB 17.4* 15.6  HCT 48.3 45.7  MCV 85.2 87.5  PLT 303 286   Cardiac Enzymes: No results found for this basename: CKTOTAL, CKMB, CKMBINDEX, TROPONINI,  in the last 72 hours BNP: No results found for this basename: PROBNP,  in the last 72 hours D-Dimer: No results found for this basename: DDIMER,   in the last 72 hours CBG:  Recent Labs  11/06/12 2010  GLUCAP 98   Hemoglobin A1C: No results found for this basename: HGBA1C,  in the last 72 hours Fasting Lipid Panel: No results found for this basename: CHOL, HDL, LDLCALC, TRIG, CHOLHDL, LDLDIRECT,  in the last 72 hours Thyroid Function Tests: No results found for this basename: TSH, T4TOTAL, FREET4, T3FREE, THYROIDAB,  in the last 72 hours Anemia Panel: No results found for this basename: VITAMINB12, FOLATE, FERRITIN, TIBC, IRON, RETICCTPCT,  in the last 72 hours Coagulation: No results found for this basename: LABPROT, INR,  in the last 72 hours Urine Drug Screen: Drugs of Abuse  No results found for this basename: labopia, cocainscrnur, labbenz, amphetmu, thcu, labbarb    Alcohol Level: No results found for this basename: ETH,  in the last 72 hours Urinalysis: No results found for this basename: COLORURINE, APPERANCEUR, LABSPEC, PHURINE, GLUCOSEU, HGBUR, BILIRUBINUR, KETONESUR, PROTEINUR, UROBILINOGEN, NITRITE, LEUKOCYTESUR,  in the last 72 hours Misc. Labs:  ABGS No results found for this basename: PHART, PCO2, PO2ART, TCO2, HCO3,  in the last 72 hours CULTURES Recent Results (from the past 240 hour(s))  URINE CULTURE     Status: None   Collection Time    11/05/12 10:20 PM      Result Value Range Status   Specimen Description URINE, RANDOM   Final   Special Requests none  Final   Culture  Setup Time 11/05/2012 22:30   Final   Colony Count >=100,000 COLONIES/ML   Final   Culture ESCHERICHIA COLI   Final   Report Status 11/07/2012 FINAL   Final   Organism ID, Bacteria ESCHERICHIA COLI   Final  CLOSTRIDIUM DIFFICILE BY PCR     Status: None   Collection Time    11/06/12  4:54 PM      Result Value Range Status   C difficile by pcr NEGATIVE  NEGATIVE Final   Studies/Results: Ct Abdomen Pelvis W Contrast  11/06/2012  *RADIOLOGY REPORT*  Clinical Data: Abdominal pain and diarrhea.  CT ABDOMEN AND PELVIS WITH  CONTRAST  Technique:  Multidetector CT imaging of the abdomen and pelvis was performed following the standard protocol during bolus administration of intravenous contrast.  Contrast:  100 ml Omnipaque-300.  Comparison: Plain films of the abdomen 11/05/2012 and CT abdomen and pelvis 08/21/2012.  Findings: Mild dependent atelectasis present in the lung bases.  No pleural or pericardial effusion.  Heart size is normal. Atherosclerotic vascular disease is noted.  As seen on plain films, the patient has a small bowel obstruction with dilated loops of small bowel measuring up to 4.5 cm in diameter and multiple air-fluid levels identified.  The transition point appears to be in the left lower quadrant where there is an angulated loop of small bowel.  There is no pneumatosis, free intraperitoneal air, portal venous gas or focal fluid collection. No mass is identified.  The patient has extensive diverticular disease of the colon, worse in the sigmoid without diverticulitis. The colon is otherwise unremarkable.  There is no lymphadenopathy.  A few small stones are seen within the gallbladder without evidence of cholecystitis.  There is a small amount of perihepatic ascites. There is unchanged 0.9 cm lesion in the dome of the liver in the left hepatic lobe on image 15.  The liver otherwise appears normal. The spleen, adrenal glands and pancreas are unremarkable.  Single bilateral renal cysts are unchanged.  No focal bony abnormality is identified.  The patient is status post left hip replacement.  IMPRESSION:  1.  Study is positive for small bowel obstruction which appears to be due to adhesions.  There is no evidence of bowel ischemia. Small amount of associated perihepatic ascites is noted. 2.  Diverticulosis without diverticulitis. 3.  Small gallstones without evidence of cholecystitis.   Original Report Authenticated By: Holley Dexter, M.D.     Medications:  Prior to Admission:  Prescriptions prior to admission   Medication Sig Dispense Refill  . allopurinol (ZYLOPRIM) 100 MG tablet Take 100 mg by mouth daily.      Marland Kitchen aspirin EC 81 MG tablet Take 81 mg by mouth daily.      . baclofen (LIORESAL) 20 MG tablet Take 20 mg by mouth 2 (two) times daily.       . cilostazol (PLETAL) 50 MG tablet Take 50 mg by mouth 2 (two) times daily.      . clopidogrel (PLAVIX) 75 MG tablet Take 75 mg by mouth daily.      Marland Kitchen diltiazem (DILACOR XR) 120 MG 24 hr capsule Take 120 mg by mouth daily.      . diphenoxylate-atropine (LOMOTIL) 2.5-0.025 MG per tablet       . Folic Acid-Vit B6-Vit B12 (FOLBEE) 2.5-25-1 MG TABS Take 1 tablet by mouth daily.      . furosemide (LASIX) 40 MG tablet Take 40 mg by mouth daily.      Marland Kitchen  glipiZIDE (GLUCOTROL XL) 2.5 MG 24 hr tablet Take 2.5 mg by mouth daily.      Marland Kitchen HYDROcodone-acetaminophen (NORCO) 10-325 MG per tablet Take 1 tablet by mouth 2 (two) times daily as needed. For pain      . metoprolol (LOPRESSOR) 50 MG tablet Take 25 mg by mouth daily.       . pantoprazole (PROTONIX) 40 MG tablet Take 40 mg by mouth daily.      . ramipril (ALTACE) 2.5 MG capsule Take 2.5 mg by mouth daily.      . simvastatin (ZOCOR) 20 MG tablet Take 20 mg by mouth every evening.      Marland Kitchen SPIRIVA HANDIHALER 18 MCG inhalation capsule Place 18 mcg into inhaler and inhale daily.       Marland Kitchen tiZANidine (ZANAFLEX) 4 MG tablet Take 2-8 mg by mouth 4 (four) times daily. Patient takes 8mg  at 11am and 8mg  at 11pm and 2mg  twice a day      . traZODone (DESYREL) 50 MG tablet Take 100 mg by mouth at bedtime.      Marland Kitchen zolpidem (AMBIEN CR) 12.5 MG CR tablet Take 12.5 mg by mouth at bedtime. For sleep       Scheduled: . allopurinol  100 mg Oral Daily  . aspirin EC  81 mg Oral Daily  . baclofen  20 mg Oral BID  . cefTRIAXone (ROCEPHIN) IVPB 1 gram/50 mL D5W  1 g Intravenous Q24H  . cilostazol  50 mg Oral BID  . clopidogrel  75 mg Oral Daily  . diltiazem  120 mg Oral Daily  . enoxaparin (LOVENOX) injection  40 mg Subcutaneous Q24H   . metoprolol  25 mg Oral Daily  . pantoprazole  40 mg Oral Daily  . potassium chloride  10 mEq Intravenous Q1 Hr x 6  . tiotropium  18 mcg Inhalation Daily  . tiZANidine  2 mg Oral Q12H   And  . tiZANidine  8 mg Oral Q12H  . traZODone  100 mg Oral QHS   Continuous: . sodium chloride 100 mL/hr at 11/08/12 4098   JXB:[id number], acetaminophen, albuterol, HYDROcodone-acetaminophen, LORazepam, morphine injection, ondansetron (ZOFRAN) IV, ondansetron, zolpidem  Assesment: He has partial small bowel obstruction. He seems to have improved. He has not had a bowel movement yet. He is much more comfortable. He has multiple other medical problems including an Escherichia coli urinary tract infection that is being treated. He is tolerating his nasogastric tube being off suction right now Principal Problem:   Partial small bowel obstruction Active Problems:   DIABETES, TYPE 2   CAD   PERIPHERAL VASCULAR DISEASE   [facility]   RENAL INSUFFICIENCY   Peripheral neuropathy   Alternating constipation and diarrhea   Dehydration   Hypokalemia    Plan: Continue current RX    LOS: 3 days   Tanner Vigna L 11/08/2012, 8:17 AM

## 2012-11-09 ENCOUNTER — Inpatient Hospital Stay (HOSPITAL_COMMUNITY): Payer: Medicare Other

## 2012-11-09 LAB — BASIC METABOLIC PANEL
CO2: 26 mEq/L (ref 19–32)
Calcium: 8.5 mg/dL (ref 8.4–10.5)
Glucose, Bld: 94 mg/dL (ref 70–99)
Potassium: 3.5 mEq/L (ref 3.5–5.1)
Sodium: 142 mEq/L (ref 135–145)

## 2012-11-09 LAB — CBC WITH DIFFERENTIAL/PLATELET
Basophils Relative: 0 % (ref 0–1)
Eosinophils Relative: 0 % (ref 0–5)
HCT: 38.8 % — ABNORMAL LOW (ref 39.0–52.0)
Hemoglobin: 13.3 g/dL (ref 13.0–17.0)
Lymphs Abs: 2.7 10*3/uL (ref 0.7–4.0)
MCH: 30.2 pg (ref 26.0–34.0)
MCV: 88 fL (ref 78.0–100.0)
Monocytes Absolute: 1.5 10*3/uL — ABNORMAL HIGH (ref 0.1–1.0)
RBC: 4.41 MIL/uL (ref 4.22–5.81)

## 2012-11-09 LAB — GLUCOSE, CAPILLARY

## 2012-11-09 MED ORDER — HYDROCORTISONE 2.5 % RE CREA
TOPICAL_CREAM | RECTAL | Status: AC
  Filled 2012-11-09: qty 28.35

## 2012-11-09 NOTE — Progress Notes (Signed)
Subjective: He says his abdomen is better but he's having bloody stools now.  Objective: Vital signs in last 24 hours: Temp:  [97.3 F (36.3 C)-97.9 F (36.6 C)] 97.9 F (36.6 C) (02/24 0403) Pulse Rate:  [108-133] 133 (02/24 0403) Resp:  [18-20] 20 (02/24 0403) BP: (132-148)/(78-99) 132/78 mmHg (02/24 0403) SpO2:  [94 %-96 %] 94 % (02/24 0801) FiO2 (%):  [21 %] 21 % (02/24 0801) Weight:  [97.07 kg (214 lb)] 97.07 kg (214 lb) (02/24 0403) Weight change: -0.363 kg (-12.8 oz) Last BM Date: 11/08/12  Intake/Output from previous day: 02/23 0701 - 02/24 0700 In: 1573.3 [I.V.:923.3; IV Piggyback:650] Out: 600 [Urine:100; Emesis/NG output:500]  PHYSICAL EXAM General appearance: alert, cooperative and mild distress Resp: clear to auscultation bilaterally Cardio: irregularly irregular rhythm GI: soft, non-tender; bowel sounds normal; no masses,  no organomegaly Extremities: extremities normal, atraumatic, no cyanosis or edema  Lab Results:    Basic Metabolic Panel:  Recent Labs  78/29/56 0444 11/09/12 0550  NA 140 142  K 2.9* 3.5  CL 97 103  CO2 28 26  GLUCOSE 61* 94  BUN 20 23  CREATININE 0.85 0.89  CALCIUM 8.0* 8.5   Liver Function Tests: No results found for this basename: AST, ALT, ALKPHOS, BILITOT, PROT, ALBUMIN,  in the last 72 hours No results found for this basename: LIPASE, AMYLASE,  in the last 72 hours No results found for this basename: AMMONIA,  in the last 72 hours CBC:  Recent Labs  11/08/12 1856 11/09/12 0550  WBC 19.1* 18.3*  NEUTROABS 15.4* 14.1*  HGB 14.7 13.3  HCT 43.2 38.8*  MCV 88.2 88.0  PLT 305 289   Cardiac Enzymes: No results found for this basename: CKTOTAL, CKMB, CKMBINDEX, TROPONINI,  in the last 72 hours BNP: No results found for this basename: PROBNP,  in the last 72 hours D-Dimer: No results found for this basename: DDIMER,  in the last 72 hours CBG:  Recent Labs  11/06/12 2010  GLUCAP 98   Hemoglobin A1C: No  results found for this basename: HGBA1C,  in the last 72 hours Fasting Lipid Panel: No results found for this basename: CHOL, HDL, LDLCALC, TRIG, CHOLHDL, LDLDIRECT,  in the last 72 hours Thyroid Function Tests: No results found for this basename: TSH, T4TOTAL, FREET4, T3FREE, THYROIDAB,  in the last 72 hours Anemia Panel: No results found for this basename: VITAMINB12, FOLATE, FERRITIN, TIBC, IRON, RETICCTPCT,  in the last 72 hours Coagulation: No results found for this basename: LABPROT, INR,  in the last 72 hours Urine Drug Screen: Drugs of Abuse  No results found for this basename: labopia, cocainscrnur, labbenz, amphetmu, thcu, labbarb    Alcohol Level: No results found for this basename: ETH,  in the last 72 hours Urinalysis: No results found for this basename: COLORURINE, APPERANCEUR, LABSPEC, PHURINE, GLUCOSEU, HGBUR, BILIRUBINUR, KETONESUR, PROTEINUR, UROBILINOGEN, NITRITE, LEUKOCYTESUR,  in the last 72 hours Misc. Labs:  ABGS No results found for this basename: PHART, PCO2, PO2ART, TCO2, HCO3,  in the last 72 hours CULTURES Recent Results (from the past 240 hour(s))  URINE CULTURE     Status: None   Collection Time    11/05/12 10:20 PM      Result Value Range Status   Specimen Description URINE, RANDOM   Final   Special Requests none   Final   Culture  Setup Time 11/05/2012 22:30   Final   Colony Count >=100,000 COLONIES/ML   Final   Culture ESCHERICHIA COLI  Final   Report Status 11/07/2012 FINAL   Final   Organism ID, Bacteria ESCHERICHIA COLI   Final  CLOSTRIDIUM DIFFICILE BY PCR     Status: None   Collection Time    11/06/12  4:54 PM      Result Value Range Status   C difficile by pcr NEGATIVE  NEGATIVE Final   Studies/Results: No results found.  Medications:  Prior to Admission:  Prescriptions prior to admission  Medication Sig Dispense Refill  . allopurinol (ZYLOPRIM) 100 MG tablet Take 100 mg by mouth daily.      Marland Kitchen aspirin EC 81 MG tablet Take 81 mg  by mouth daily.      . baclofen (LIORESAL) 20 MG tablet Take 20 mg by mouth 2 (two) times daily.       . cilostazol (PLETAL) 50 MG tablet Take 50 mg by mouth 2 (two) times daily.      . clopidogrel (PLAVIX) 75 MG tablet Take 75 mg by mouth daily.      Marland Kitchen diltiazem (DILACOR XR) 120 MG 24 hr capsule Take 120 mg by mouth daily.      . diphenoxylate-atropine (LOMOTIL) 2.5-0.025 MG per tablet       . Folic Acid-Vit B6-Vit B12 (FOLBEE) 2.5-25-1 MG TABS Take 1 tablet by mouth daily.      . furosemide (LASIX) 40 MG tablet Take 40 mg by mouth daily.      Marland Kitchen glipiZIDE (GLUCOTROL XL) 2.5 MG 24 hr tablet Take 2.5 mg by mouth daily.      Marland Kitchen HYDROcodone-acetaminophen (NORCO) 10-325 MG per tablet Take 1 tablet by mouth 2 (two) times daily as needed. For pain      . metoprolol (LOPRESSOR) 50 MG tablet Take 25 mg by mouth daily.       . pantoprazole (PROTONIX) 40 MG tablet Take 40 mg by mouth daily.      . ramipril (ALTACE) 2.5 MG capsule Take 2.5 mg by mouth daily.      . simvastatin (ZOCOR) 20 MG tablet Take 20 mg by mouth every evening.      Marland Kitchen SPIRIVA HANDIHALER 18 MCG inhalation capsule Place 18 mcg into inhaler and inhale daily.       Marland Kitchen tiZANidine (ZANAFLEX) 4 MG tablet Take 2-8 mg by mouth 4 (four) times daily. Patient takes 8mg  at 11am and 8mg  at 11pm and 2mg  twice a day      . traZODone (DESYREL) 50 MG tablet Take 100 mg by mouth at bedtime.      Marland Kitchen zolpidem (AMBIEN CR) 12.5 MG CR tablet Take 12.5 mg by mouth at bedtime. For sleep       Scheduled: . allopurinol  100 mg Oral Daily  . antiseptic oral rinse  15 mL Mouth Rinse q12n4p  . [START ON 11/10/2012] aspirin EC  81 mg Oral Daily  . baclofen  20 mg Oral BID  . cefTRIAXone (ROCEPHIN) IVPB 1 gram/50 mL D5W  1 g Intravenous Q24H  . chlorhexidine  15 mL Mouth Rinse BID  . cilostazol  50 mg Oral BID  . [START ON 11/10/2012] clopidogrel  75 mg Oral Daily  . diltiazem  120 mg Oral Daily  . hydrocortisone   Rectal QID  . metoprolol  25 mg Oral Daily  .  pantoprazole  40 mg Oral Daily  . tiotropium  18 mcg Inhalation Daily  . tiZANidine  2 mg Oral Q12H   And  . tiZANidine  8 mg Oral Q12H  .  traZODone  100 mg Oral QHS   Continuous: . sodium chloride 100 mL/hr at 11/08/12 1313   ION:[id number], acetaminophen, albuterol, HYDROcodone-acetaminophen, LORazepam, morphine injection, ondansetron (ZOFRAN) IV, ondansetron, zolpidem  Assesment: He was admitted with partial small bowel obstruction. He is having bloody stools now. He has multiple other medical problems but all of that is doing okay thus far despite him being sick Principal Problem:   Partial small bowel obstruction Active Problems:   DIABETES, TYPE 2   CAD   PERIPHERAL VASCULAR DISEASE   C O P D   RENAL INSUFFICIENCY   Peripheral neuropathy   Alternating constipation and diarrhea   Dehydration   Hypokalemia    Plan: No change in treatments I will discuss his situation with GI consultant    LOS: 4 days   Cayden Granholm L 11/09/2012, 8:41 AM

## 2012-11-09 NOTE — Progress Notes (Addendum)
Patient ID: Chad Morse, male   DOB: 01-13-1941, 72 y.o.   MRN: [id number] States he feels better. He feels 100% better. He says he started having BMs yesterday. He tells me he has had 2-3 BMs since yesterday. Describes stools as mushy. He also tells me he is having rectal bleeding. He has over 700cc in suction container: light green color. Tawana Scale Vitals:   11/08/12 0804 11/08/12 1346 11/09/12 0403 11/09/12 0801  BP:  148/99 132/78   Pulse:  108 133   Temp:  97.3 F (36.3 C) 97.9 F (36.6 C)   TempSrc:  Oral Oral   Resp:  18 20   Height:      Weight:   214 lb (97.07 kg)   SpO2: 89% 96% 96% 94%   Alert and oriented. BS+. Abdomen is soft A/P Small bowel obstruction. He has a 2-3 BMs. One stool was formed. I will discuss with Dr. Marline Backbone.                                                                                                                                                                GI attending note; Patient denies nausea vomiting or abdominal pain. He is hungry. He has had 20 bowel movements in the last 24 hours. He is passing flatus. He is also passing burgundy blood per rectum. Patient states that he has not been able to sleep for the last 4 nights. Aspirin, clopidogrel and enoxaparin is on hold.  Abdomen is nondistended anymore. Bowel sounds are normal. Abdomen is very soft. WBC 18.3, H&H 13.3 and 38.4 and platelet count 289K Assessment; #1. Small bowel obstruction seemed to have resolved with NG decompression. Will obtain acute abdominal series before oral feeding begun. #2. Rectal bleeding. Suspect diverticular bleed. Antiplatelet agents and enoxaparin on hold. Hemoglobin has dropped by 4 g since admission but most of the drop would appear to be due to rehydration. Patient's last colonoscopy was in August 2011 for delayed post polypectomy bleed he was noted to have left-sided diverticulosis(blleed  2 weeks post polypectomy). Recommendations; Acute abdominal  series. CBC in a.m.

## 2012-11-09 NOTE — Progress Notes (Signed)
Inpatient Diabetes Program Recommendations  AACE/ADA: New Consensus Statement on Inpatient Glycemic Control (2013)  Target Ranges:  Prepandial:   less than 140 mg/dL      Peak postprandial:   less than 180 mg/dL (1-2 hours)      Critically ill patients:  140 - 180 mg/dL   Results for Chad Morse, Chad Morse (MRN [id number]) as of 11/09/2012 08:13  Ref. Range 11/07/2012 04:38 11/08/2012 04:44 11/09/2012 05:50  Glucose Latest Range: 70-99 mg/dL 71 61 (L) 94    Inpatient Diabetes Program Recommendations Correction (SSI): May want to consider ordering CBGs Q4H while patient is NPO and transition to ACHS once patient is started on a diet.  Note: Patient has a history of diabetes and takes Glipizide 2.5 mg daily at home for diabetes management.  Patient was admitted for partial small bowel obstruction and has been NPO.  Noted that patient's fasting lab blood glucose was 61 mg/dl on 12/24/79.  May want to consider ordering CBGs Q4H while patient is NPO and transition to ACHS once a diet is started to monitor inpatient glycemic control.  Also, may want to consider adding dextrose to IVF if patient remains NPO.  Will continue to follow.  Thanks, Orlando Penner, RN, BSN, CCRN Diabetes Coordinator Inpatient Diabetes Program [phone]

## 2012-11-10 LAB — HEMOGLOBIN AND HEMATOCRIT, BLOOD
HCT: 27.6 % — ABNORMAL LOW (ref 39.0–52.0)
HCT: 26.7 % — ABNORMAL LOW (ref 39.0–52.0)
Hemoglobin: 9.8 g/dL — ABNORMAL LOW (ref 13.0–17.0)
Hemoglobin: 9.7 g/dL — ABNORMAL LOW (ref 13.0–17.0)

## 2012-11-10 LAB — CBC
HCT: 28.1 % — ABNORMAL LOW (ref 39.0–52.0)
Hemoglobin: 9.6 g/dL — ABNORMAL LOW (ref 13.0–17.0)
MCH: 29.9 pg (ref 26.0–34.0)
MCHC: 34.2 g/dL (ref 30.0–36.0)

## 2012-11-10 LAB — GLUCOSE, CAPILLARY
Glucose-Capillary: 123 mg/dL — ABNORMAL HIGH (ref 70–99)
Glucose-Capillary: 135 mg/dL — ABNORMAL HIGH (ref 70–99)

## 2012-11-10 MED ORDER — PANTOPRAZOLE SODIUM 40 MG IV SOLR
40.0000 mg | Freq: Two times a day (BID) | INTRAVENOUS | Status: DC
  Administered 2012-11-10 – 2012-11-12 (×5): 40 mg via INTRAVENOUS
  Filled 2012-11-10 (×5): qty 40

## 2012-11-10 NOTE — Progress Notes (Signed)
Subjective: He says he feels better. He has no new complaints. He is still having some blood per rectum but his abdomen feels better. He is eating some full liquids this morning. His NG tube has been removed  Objective: Vital signs in last 24 hours: Temp:  [97.4 F (36.3 C)-97.5 F (36.4 C)] 97.5 F (36.4 C) (02/25 0432) Pulse Rate:  [95-159] 95 (02/25 0432) Resp:  [16-20] 16 (02/25 0432) BP: (115-135)/(79-82) 133/79 mmHg (02/25 0432) SpO2:  [90 %-96 %] 93 % (02/25 0823) Weight:  [99.02 kg (218 lb 4.8 oz)] 99.02 kg (218 lb 4.8 oz) (02/25 0432) Weight change: 1.95 kg (4 lb 4.8 oz) Last BM Date: 11/09/12  Intake/Output from previous day: 02/24 0701 - 02/25 0700 In: 3750.8 [I.V.:3750.8] Out: 300 [Urine:300]  PHYSICAL EXAM General appearance: alert, cooperative and mild distress Resp: clear to auscultation bilaterally Cardio: irregularly irregular rhythm GI: soft, non-tender; bowel sounds normal; no masses,  no organomegaly Extremities: extremities normal, atraumatic, no cyanosis or edema  Lab Results:    Basic Metabolic Panel:  Recent Labs  16/10/96 0444 11/09/12 0550  NA 140 142  K 2.9* 3.5  CL 97 103  CO2 28 26  GLUCOSE 61* 94  BUN 20 23  CREATININE 0.85 0.89  CALCIUM 8.0* 8.5   Liver Function Tests: No results found for this basename: AST, ALT, ALKPHOS, BILITOT, PROT, ALBUMIN,  in the last 72 hours No results found for this basename: LIPASE, AMYLASE,  in the last 72 hours No results found for this basename: AMMONIA,  in the last 72 hours CBC:  Recent Labs  11/08/12 1856 11/09/12 0550 11/10/12 0445  WBC 19.1* 18.3* 14.6*  NEUTROABS 15.4* 14.1*  --   HGB 14.7 13.3 9.6*  HCT 43.2 38.8* 28.1*  MCV 88.2 88.0 87.5  PLT 305 289 231   Cardiac Enzymes: No results found for this basename: CKTOTAL, CKMB, CKMBINDEX, TROPONINI,  in the last 72 hours BNP: No results found for this basename: PROBNP,  in the last 72 hours D-Dimer: No results found for this  basename: DDIMER,  in the last 72 hours CBG:  Recent Labs  11/09/12 1957 11/10/12 0008 11/10/12 0431 11/10/12 0801  GLUCAP 158* 135* 123* 117*   Hemoglobin A1C:  Recent Labs  11/09/12 0550  HGBA1C 6.9*   Fasting Lipid Panel: No results found for this basename: CHOL, HDL, LDLCALC, TRIG, CHOLHDL, LDLDIRECT,  in the last 72 hours Thyroid Function Tests: No results found for this basename: TSH, T4TOTAL, FREET4, T3FREE, THYROIDAB,  in the last 72 hours Anemia Panel: No results found for this basename: VITAMINB12, FOLATE, FERRITIN, TIBC, IRON, RETICCTPCT,  in the last 72 hours Coagulation: No results found for this basename: LABPROT, INR,  in the last 72 hours Urine Drug Screen: Drugs of Abuse  No results found for this basename: labopia, cocainscrnur, labbenz, amphetmu, thcu, labbarb    Alcohol Level: No results found for this basename: ETH,  in the last 72 hours Urinalysis: No results found for this basename: COLORURINE, APPERANCEUR, LABSPEC, PHURINE, GLUCOSEU, HGBUR, BILIRUBINUR, KETONESUR, PROTEINUR, UROBILINOGEN, NITRITE, LEUKOCYTESUR,  in the last 72 hours Misc. Labs:  ABGS No results found for this basename: PHART, PCO2, PO2ART, TCO2, HCO3,  in the last 72 hours CULTURES Recent Results (from the past 240 hour(s))  URINE CULTURE     Status: None   Collection Time    11/05/12 10:20 PM      Result Value Range Status   Specimen Description URINE, RANDOM   Final  Special Requests none   Final   Culture  Setup Time 11/05/2012 22:30   Final   Colony Count >=100,000 COLONIES/ML   Final   Culture ESCHERICHIA COLI   Final   Report Status 11/07/2012 FINAL   Final   Organism ID, Bacteria ESCHERICHIA COLI   Final  CLOSTRIDIUM DIFFICILE BY PCR     Status: None   Collection Time    11/06/12  4:54 PM      Result Value Range Status   C difficile by pcr NEGATIVE  NEGATIVE Final   Studies/Results: Dg Abd Acute W/chest  11/09/2012  *RADIOLOGY REPORT*  Clinical Data: Follow-up  small bowel obstruction.  ACUTE ABDOMEN SERIES (ABDOMEN 2 VIEW & CHEST 1 VIEW)  Comparison: Plain films 11/05/2012.  CT 11/06/2012  Findings: There is hyperinflation of the lungs compatible with COPD.  Heart is borderline in size.  Mild interstitial prominence throughout the lungs, increased throughout the right lung since prior study.  This may in part be related to rotation.  The patient is significantly rotated to the left.  No effusions.  NG tube enters the stomach.  Continue small bowel dilatation compatible with small bowel obstruction.  This remains a moderate to high-grade but likely slightly improved since prior study.  No free air.  No organomegaly or suspicious calcification.  No acute bony abnormality.  IMPRESSION: Continue small bowel obstruction with slight improvement since prior study.  COPD.   Original Report Authenticated By: Charlett Nose, M.D.     Medications:  Prior to Admission:  Prescriptions prior to admission  Medication Sig Dispense Refill  . allopurinol (ZYLOPRIM) 100 MG tablet Take 100 mg by mouth daily.      Marland Kitchen aspirin EC 81 MG tablet Take 81 mg by mouth daily.      . baclofen (LIORESAL) 20 MG tablet Take 20 mg by mouth 2 (two) times daily.       . cilostazol (PLETAL) 50 MG tablet Take 50 mg by mouth 2 (two) times daily.      . clopidogrel (PLAVIX) 75 MG tablet Take 75 mg by mouth daily.      Marland Kitchen diltiazem (DILACOR XR) 120 MG 24 hr capsule Take 120 mg by mouth daily.      . diphenoxylate-atropine (LOMOTIL) 2.5-0.025 MG per tablet       . Folic Acid-Vit B6-Vit B12 (FOLBEE) 2.5-25-1 MG TABS Take 1 tablet by mouth daily.      . furosemide (LASIX) 40 MG tablet Take 40 mg by mouth daily.      Marland Kitchen glipiZIDE (GLUCOTROL XL) 2.5 MG 24 hr tablet Take 2.5 mg by mouth daily.      Marland Kitchen HYDROcodone-acetaminophen (NORCO) 10-325 MG per tablet Take 1 tablet by mouth 2 (two) times daily as needed. For pain      . metoprolol (LOPRESSOR) 50 MG tablet Take 25 mg by mouth daily.       . pantoprazole  (PROTONIX) 40 MG tablet Take 40 mg by mouth daily.      . ramipril (ALTACE) 2.5 MG capsule Take 2.5 mg by mouth daily.      . simvastatin (ZOCOR) 20 MG tablet Take 20 mg by mouth every evening.      Marland Kitchen SPIRIVA HANDIHALER 18 MCG inhalation capsule Place 18 mcg into inhaler and inhale daily.       Marland Kitchen tiZANidine (ZANAFLEX) 4 MG tablet Take 2-8 mg by mouth 4 (four) times daily. Patient takes 8mg  at 11am and 8mg  at 11pm and 2mg  twice  a day      . traZODone (DESYREL) 50 MG tablet Take 100 mg by mouth at bedtime.      Marland Kitchen zolpidem (AMBIEN CR) 12.5 MG CR tablet Take 12.5 mg by mouth at bedtime. For sleep       Scheduled: . allopurinol  100 mg Oral Daily  . antiseptic oral rinse  15 mL Mouth Rinse q12n4p  . baclofen  20 mg Oral BID  . cefTRIAXone (ROCEPHIN) IVPB 1 gram/50 mL D5W  1 g Intravenous Q24H  . chlorhexidine  15 mL Mouth Rinse BID  . cilostazol  50 mg Oral BID  . diltiazem  120 mg Oral Daily  . hydrocortisone   Rectal QID  . metoprolol  25 mg Oral Daily  . pantoprazole  40 mg Oral Daily  . tiotropium  18 mcg Inhalation Daily  . tiZANidine  2 mg Oral Q12H   And  . tiZANidine  8 mg Oral Q12H  . traZODone  100 mg Oral QHS   Continuous: . sodium chloride 75 mL/hr at 11/10/12 0107   ZOX:[id number], acetaminophen, albuterol, HYDROcodone-acetaminophen, LORazepam, morphine injection, ondansetron (ZOFRAN) IV, ondansetron, zolpidem  Assesment: He was admitted with partial small bowel obstruction. He has had GI bleeding and after discussion with Dr. Karilyn Cota he thinks this is probably diverticular in nature. He has dropped his hemoglobin so he has some acute blood loss anemia. He is much improved as far as the partial small bowel obstruction. He has multiple other medical problems which are stable at this point. I do not think he needs a blood transfusion now but will repeat his hemoglobin level. Principal Problem:   Partial small bowel obstruction Active Problems:   DIABETES, TYPE 2   CAD    PERIPHERAL VASCULAR DISEASE   C O P D   RENAL INSUFFICIENCY   Peripheral neuropathy   Alternating constipation and diarrhea   Dehydration   Hypokalemia    Plan: Continue current medications recheck his hemoglobin level    LOS: 5 days   Jesper Stirewalt L 11/10/2012, 8:33 AM

## 2012-11-10 NOTE — Progress Notes (Signed)
Patient's 12 fr NG-tube discontinued per Dr Patty Sermons order,patient tolerated well,no c/o pain or discomfort noted.

## 2012-11-10 NOTE — Progress Notes (Signed)
UR Chart Review Completed  

## 2012-11-10 NOTE — Plan of Care (Signed)
Problem: Phase III Progression Outcomes Goal: Activity at appropriate level-compared to baseline (UP IN CHAIR FOR HEMODIALYSIS)  Outcome: Progressing Up to chair x 2hrs ambulating with walker Goal: Voiding independently Outcome: Progressing Pt is incont of bowels  at times

## 2012-11-10 NOTE — Progress Notes (Signed)
Patient ordered to have SCD's on, informed patient of this, patient refused to have SCD's placed, informed patient of the risk of DVT without any type of prophylaxis since his aspirin and lovenox have been stopped due to passing blood through rectum, patient verbalized understanding but still refuses to wear SCD's.

## 2012-11-10 NOTE — Progress Notes (Signed)
Patient ID: Chad Morse, male   DOB: 07-02-1941, 72 y.o.   MRN: [id number] Says he feels better. He did have a BM this am. Continues to pass some blood. No nausea or vomiting. Appetite is good.  Filed Vitals:   11/09/12 1431 11/09/12 2052 11/10/12 0432 11/10/12 0823  BP: 115/82 135/79 133/79   Pulse: 159 101 95   Temp: 97.5 F (36.4 C) 97.4 F (36.3 C) 97.5 F (36.4 C)   TempSrc:  Oral Oral   Resp: 20 18 16    Height:      Weight:   218 lb 4.8 oz (99.02 kg)   SpO2: 96% 95% 90% 93%  Will advance diet to regular.

## 2012-11-11 LAB — CBC
HCT: 24.2 % — ABNORMAL LOW (ref 39.0–52.0)
Hemoglobin: 8.4 g/dL — ABNORMAL LOW (ref 13.0–17.0)
MCH: 30.3 pg (ref 26.0–34.0)
MCHC: 34.7 g/dL (ref 30.0–36.0)
MCV: 87.4 fL (ref 78.0–100.0)
RDW: 13.7 % (ref 11.5–15.5)

## 2012-11-11 LAB — GLUCOSE, CAPILLARY
Glucose-Capillary: 107 mg/dL — ABNORMAL HIGH (ref 70–99)
Glucose-Capillary: 141 mg/dL — ABNORMAL HIGH (ref 70–99)

## 2012-11-11 LAB — HEMOGLOBIN AND HEMATOCRIT, BLOOD: HCT: 23.1 % — ABNORMAL LOW (ref 39.0–52.0)

## 2012-11-11 MED ORDER — TIZANIDINE HCL 4 MG PO TABS
4.0000 mg | ORAL_TABLET | ORAL | Status: DC
  Administered 2012-11-11 – 2012-11-12 (×4): 4 mg via ORAL
  Filled 2012-11-11 (×11): qty 1

## 2012-11-11 MED ORDER — TIZANIDINE HCL 2 MG PO TABS: 2.0000 mg | ORAL_TABLET | Freq: Two times a day (BID) | ORAL | Status: DC

## 2012-11-11 MED ORDER — PROMETHAZINE HCL 25 MG/ML IJ SOLN
INTRAMUSCULAR | Status: AC
  Filled 2012-11-11: qty 1

## 2012-11-11 MED ORDER — TIZANIDINE HCL 2 MG PO TABS
2.0000 mg | ORAL_TABLET | Freq: Two times a day (BID) | ORAL | Status: DC
  Administered 2012-11-11 – 2012-11-13 (×5): 2 mg via ORAL
  Filled 2012-11-11 (×11): qty 1

## 2012-11-11 MED ORDER — BACLOFEN 10 MG PO TABS
20.0000 mg | ORAL_TABLET | ORAL | Status: DC
  Administered 2012-11-11 – 2012-11-12 (×4): 20 mg via ORAL
  Filled 2012-11-11 (×4): qty 2

## 2012-11-11 MED ORDER — SODIUM CHLORIDE 0.9 % IJ SOLN
INTRAMUSCULAR | Status: AC
  Administered 2012-11-11: 10 mL
  Filled 2012-11-11: qty 3

## 2012-11-11 MED ORDER — BACLOFEN 10 MG PO TABS
10.0000 mg | ORAL_TABLET | Freq: Two times a day (BID) | ORAL | Status: DC
  Administered 2012-11-11 – 2012-11-13 (×5): 10 mg via ORAL
  Filled 2012-11-11 (×5): qty 1

## 2012-11-11 MED ORDER — SODIUM CHLORIDE 0.9 % IJ SOLN
INTRAMUSCULAR | Status: AC
  Filled 2012-11-11: qty 10

## 2012-11-11 NOTE — Progress Notes (Signed)
Subjective: He feels better. He is eating. He has not had anymore obvious bleeding. He is having some more problem with neuropathy and I have adjusted his medications so that they are I think the way he takes them at home  Objective: Vital signs in last 24 hours: Temp:  [97.3 F (36.3 C)-98.2 F (36.8 C)] 97.3 F (36.3 C) (02/26 0248) Pulse Rate:  [74-95] 74 (02/26 0748) Resp:  [16-18] 18 (02/26 0248) BP: (111-114)/(63-70) 114/67 mmHg (02/26 0248) SpO2:  [91 %-94 %] 91 % (02/26 0748) Weight:  [101.6 kg (223 lb 15.8 oz)] 101.6 kg (223 lb 15.8 oz) (02/26 0627) Weight change: 2.58 kg (5 lb 11 oz) Last BM Date: 11/10/12  Intake/Output from previous day: 02/25 0701 - 02/26 0700 In: 1857.5 [P.O.:240; I.V.:1617.5] Out: 600 [Urine:600]  PHYSICAL EXAM General appearance: alert, cooperative and no distress Resp: clear to auscultation bilaterally Cardio: regular rate and rhythm, S1, S2 normal, no murmur, click, rub or gallop GI: soft, non-tender; bowel sounds normal; no masses,  no organomegaly Extremities: Chronic atrophic changes  Lab Results:    Basic Metabolic Panel:  Recent Labs  62/13/08 0550  NA 142  K 3.5  CL 103  CO2 26  GLUCOSE 94  BUN 23  CREATININE 0.89  CALCIUM 8.5   Liver Function Tests: No results found for this basename: AST, ALT, ALKPHOS, BILITOT, PROT, ALBUMIN,  in the last 72 hours No results found for this basename: LIPASE, AMYLASE,  in the last 72 hours No results found for this basename: AMMONIA,  in the last 72 hours CBC:  Recent Labs  11/08/12 1856 11/09/12 0550 11/10/12 0445  11/11/12 0045 11/11/12 0431  WBC 19.1* 18.3* 14.6*  --   --   --   NEUTROABS 15.4* 14.1*  --   --   --   --   HGB 14.7 13.3 9.6*  < > 8.7* 8.0*  HCT 43.2 38.8* 28.1*  < > 24.7* 23.1*  MCV 88.2 88.0 87.5  --   --   --   PLT 305 289 231  --   --   --   < > = values in this interval not displayed. Cardiac Enzymes: No results found for this basename: CKTOTAL, CKMB,  CKMBINDEX, TROPONINI,  in the last 72 hours BNP: No results found for this basename: PROBNP,  in the last 72 hours D-Dimer: No results found for this basename: DDIMER,  in the last 72 hours CBG:  Recent Labs  11/10/12 1149 11/10/12 1649 11/10/12 2008 11/11/12 0014 11/11/12 0430 11/11/12 0759  GLUCAP 132* 135* 135* 140* 118* 107*   Hemoglobin A1C:  Recent Labs  11/09/12 0550  HGBA1C 6.9*   Fasting Lipid Panel: No results found for this basename: CHOL, HDL, LDLCALC, TRIG, CHOLHDL, LDLDIRECT,  in the last 72 hours Thyroid Function Tests: No results found for this basename: TSH, T4TOTAL, FREET4, T3FREE, THYROIDAB,  in the last 72 hours Anemia Panel: No results found for this basename: VITAMINB12, FOLATE, FERRITIN, TIBC, IRON, RETICCTPCT,  in the last 72 hours Coagulation: No results found for this basename: LABPROT, INR,  in the last 72 hours Urine Drug Screen: Drugs of Abuse  No results found for this basename: labopia, cocainscrnur, labbenz, amphetmu, thcu, labbarb    Alcohol Level: No results found for this basename: ETH,  in the last 72 hours Urinalysis: No results found for this basename: COLORURINE, APPERANCEUR, LABSPEC, PHURINE, GLUCOSEU, HGBUR, BILIRUBINUR, KETONESUR, PROTEINUR, UROBILINOGEN, NITRITE, LEUKOCYTESUR,  in the last 72 hours Misc.  Labs:  ABGS No results found for this basename: PHART, PCO2, PO2ART, TCO2, HCO3,  in the last 72 hours CULTURES Recent Results (from the past 240 hour(s))  URINE CULTURE     Status: None   Collection Time    11/05/12 10:20 PM      Result Value Range Status   Specimen Description URINE, RANDOM   Final   Special Requests none   Final   Culture  Setup Time 11/05/2012 22:30   Final   Colony Count >=100,000 COLONIES/ML   Final   Culture ESCHERICHIA COLI   Final   Report Status 11/07/2012 FINAL   Final   Organism ID, Bacteria ESCHERICHIA COLI   Final  CLOSTRIDIUM DIFFICILE BY PCR     Status: None   Collection Time     11/06/12  4:54 PM      Result Value Range Status   C difficile by pcr NEGATIVE  NEGATIVE Final   Studies/Results: Dg Abd Acute W/chest  11/09/2012  *RADIOLOGY REPORT*  Clinical Data: Follow-up small bowel obstruction.  ACUTE ABDOMEN SERIES (ABDOMEN 2 VIEW & CHEST 1 VIEW)  Comparison: Plain films 11/05/2012.  CT 11/06/2012  Findings: There is hyperinflation of the lungs compatible with COPD.  Heart is borderline in size.  Mild interstitial prominence throughout the lungs, increased throughout the right lung since prior study.  This may in part be related to rotation.  The patient is significantly rotated to the left.  No effusions.  NG tube enters the stomach.  Continue small bowel dilatation compatible with small bowel obstruction.  This remains a moderate to high-grade but likely slightly improved since prior study.  No free air.  No organomegaly or suspicious calcification.  No acute bony abnormality.  IMPRESSION: Continue small bowel obstruction with slight improvement since prior study.  COPD.   Original Report Authenticated By: Charlett Nose, M.D.     Medications:  Prior to Admission:  Prescriptions prior to admission  Medication Sig Dispense Refill  . allopurinol (ZYLOPRIM) 100 MG tablet Take 100 mg by mouth daily.      Marland Kitchen aspirin EC 81 MG tablet Take 81 mg by mouth daily.      . baclofen (LIORESAL) 20 MG tablet Take 20 mg by mouth 2 (two) times daily.       . cilostazol (PLETAL) 50 MG tablet Take 50 mg by mouth 2 (two) times daily.      . clopidogrel (PLAVIX) 75 MG tablet Take 75 mg by mouth daily.      Marland Kitchen diltiazem (DILACOR XR) 120 MG 24 hr capsule Take 120 mg by mouth daily.      . diphenoxylate-atropine (LOMOTIL) 2.5-0.025 MG per tablet       . Folic Acid-Vit B6-Vit B12 (FOLBEE) 2.5-25-1 MG TABS Take 1 tablet by mouth daily.      . furosemide (LASIX) 40 MG tablet Take 40 mg by mouth daily.      Marland Kitchen glipiZIDE (GLUCOTROL XL) 2.5 MG 24 hr tablet Take 2.5 mg by mouth daily.      Marland Kitchen  HYDROcodone-acetaminophen (NORCO) 10-325 MG per tablet Take 1 tablet by mouth 2 (two) times daily as needed. For pain      . metoprolol (LOPRESSOR) 50 MG tablet Take 25 mg by mouth daily.       . pantoprazole (PROTONIX) 40 MG tablet Take 40 mg by mouth daily.      . ramipril (ALTACE) 2.5 MG capsule Take 2.5 mg by mouth daily.      Marland Kitchen  simvastatin (ZOCOR) 20 MG tablet Take 20 mg by mouth every evening.      Marland Kitchen SPIRIVA HANDIHALER 18 MCG inhalation capsule Place 18 mcg into inhaler and inhale daily.       Marland Kitchen tiZANidine (ZANAFLEX) 4 MG tablet Take 2-8 mg by mouth 4 (four) times daily. Patient takes 8mg  at 11am and 8mg  at 11pm and 2mg  twice a day      . traZODone (DESYREL) 50 MG tablet Take 100 mg by mouth at bedtime.      Marland Kitchen zolpidem (AMBIEN CR) 12.5 MG CR tablet Take 12.5 mg by mouth at bedtime. For sleep       Scheduled: . allopurinol  100 mg Oral Daily  . antiseptic oral rinse  15 mL Mouth Rinse q12n4p  . cefTRIAXone (ROCEPHIN) IVPB 1 gram/50 mL D5W  1 g Intravenous Q24H  . chlorhexidine  15 mL Mouth Rinse BID  . cilostazol  50 mg Oral BID  . diltiazem  120 mg Oral Daily  . hydrocortisone   Rectal QID  . metoprolol  25 mg Oral Daily  . pantoprazole (PROTONIX) IV  40 mg Intravenous Q12H  . tiotropium  18 mcg Inhalation Daily  . traZODone  100 mg Oral QHS   Continuous: . sodium chloride 50 mL/hr at 11/11/12 4098   JXB:[id number], acetaminophen, albuterol, HYDROcodone-acetaminophen, LORazepam, morphine injection, ondansetron (ZOFRAN) IV, ondansetron, zolpidem  Assesment: He was admitted with partial small bowel obstruction. Since then he's had what appears to be a diverticular bleed. He has acute blood loss anemia and I think he is now equilibrated and his hemoglobin level is 8. He has coronary artery occlusive disease and severe COPD and  Peripheral vascular disease so I think he needs a blood transfusion. His bleeding seems to have stopped. He has peripheral neuropathy and I have adjusted his  medications. Principal Problem:   Partial small bowel obstruction Active Problems:   DIABETES, TYPE 2   CAD   PERIPHERAL VASCULAR DISEASE   C O P D   RENAL INSUFFICIENCY   Peripheral neuropathy   Alternating constipation and diarrhea   Dehydration   Hypokalemia   Gastrointestinal bleeding, lower   Acute blood loss anemia    Plan: He will receive a blood transfusion. I adjusted his medications. I'm going to try to get him up and moving. I think he'll be ready to go home probably on the 28th    LOS: 6 days   Jayley Hustead L 11/11/2012, 8:17 AM

## 2012-11-11 NOTE — Progress Notes (Signed)
Patient received 2 units of PRBCs today for hemoglobin of 8 grams He is passing liquid brown stool. He is also passing flatus. He is free of abdominal pain and hungry. Abdominal exam reveals full but soft and nontender abdomen. Assessment; Small bowel obstruction secondary to adhesions has resolved. GI bleed most likely secondary to colonic diverticulosis. Recommendations. CBC and metabolic 7 in a.m. Advance diet to heart healthy diet.

## 2012-11-12 LAB — BASIC METABOLIC PANEL
BUN: 7 mg/dL (ref 6–23)
Creatinine, Ser: 0.77 mg/dL (ref 0.50–1.35)
GFR calc non Af Amer: 88 mL/min — ABNORMAL LOW (ref >90)
Glucose, Bld: 89 mg/dL (ref 70–99)
Potassium: 3 mEq/L — ABNORMAL LOW (ref 3.5–5.1)

## 2012-11-12 LAB — CBC WITH DIFFERENTIAL/PLATELET
Basophils Absolute: 0.1 10*3/uL (ref 0.0–0.1)
Eosinophils Absolute: 0.2 10*3/uL (ref 0.0–0.7)
Eosinophils Relative: 2 % (ref 0–5)
HCT: 32.4 % — ABNORMAL LOW (ref 39.0–52.0)
Lymphocytes Relative: 23 % (ref 12–46)
Lymphs Abs: 2.6 10*3/uL (ref 0.7–4.0)
MCH: 29.3 pg (ref 26.0–34.0)
MCV: 85.5 fL (ref 78.0–100.0)
Monocytes Absolute: 0.9 10*3/uL (ref 0.1–1.0)
RDW: 15.2 % (ref 11.5–15.5)
WBC: 11.6 10*3/uL — ABNORMAL HIGH (ref 4.0–10.5)

## 2012-11-12 LAB — TYPE AND SCREEN
Antibody Screen: NEGATIVE
Unit division: 0

## 2012-11-12 LAB — GLUCOSE, CAPILLARY
Glucose-Capillary: 88 mg/dL (ref 70–99)
Glucose-Capillary: 110 mg/dL — ABNORMAL HIGH (ref 70–99)

## 2012-11-12 LAB — HEMOGLOBIN AND HEMATOCRIT, BLOOD
HCT: 29.8 % — ABNORMAL LOW (ref 39.0–52.0)
Hemoglobin: 10.4 g/dL — ABNORMAL LOW (ref 13.0–17.0)

## 2012-11-12 MED ORDER — PANTOPRAZOLE SODIUM 40 MG PO TBEC
40.0000 mg | DELAYED_RELEASE_TABLET | Freq: Every day | ORAL | Status: DC
  Administered 2012-11-13: 40 mg via ORAL
  Filled 2012-11-12: qty 1

## 2012-11-12 MED ORDER — POTASSIUM CHLORIDE 20 MEQ PO PACK
20.0000 meq | PACK | Freq: Every day | ORAL | Status: DC
  Administered 2012-11-12 – 2012-11-13 (×2): 20 meq via ORAL
  Filled 2012-11-12 (×6): qty 1

## 2012-11-12 MED ORDER — POTASSIUM CHLORIDE CRYS ER 20 MEQ PO TBCR
20.0000 meq | EXTENDED_RELEASE_TABLET | Freq: Once | ORAL | Status: AC
  Administered 2012-11-12: 20 meq via ORAL
  Filled 2012-11-12: qty 1

## 2012-11-12 NOTE — Progress Notes (Signed)
Patient ID: CEZAR MISIASZEK, male   DOB: 03/21/1941, 72 y.o.   MRN: [id number] Appetite is better. Received 2 units of Blood yesterday. He tells me he had 2 BMs. No rectal bleeding today.  K is 3.0. Will start on Potassium po. Filed Vitals:   11/11/12 2330 11/12/12 0230 11/12/12 0500 11/12/12 0806  BP: 111/70 123/79 150/67   Pulse: 76 74 76   Temp: 97.4 F (36.3 C) 97.5 F (36.4 C) 97.6 F (36.4 C)   TempSrc: Oral Oral Oral   Resp: 20 18 18    Height:      Weight:   222 lb 4.8 oz (100.835 kg)   SpO2:  93% 93% 94%

## 2012-11-12 NOTE — Progress Notes (Signed)
Subjective: He says he feels better. He has no new complaints or abdominal pain. He's not had any more bleeding. He did receive 2 units of packed red blood cells yesterday  Objective: Vital signs in last 24 hours: Temp:  [97.3 F (36.3 C)-98.4 F (36.9 C)] 97.6 F (36.4 C) (02/27 0500) Pulse Rate:  [72-91] 76 (02/27 0500) Resp:  [18-20] 18 (02/27 0500) BP: (97-150)/(58-79) 150/67 mmHg (02/27 0500) SpO2:  [91 %-94 %] 94 % (02/27 0806) Weight:  [100.835 kg (222 lb 4.8 oz)] 100.835 kg (222 lb 4.8 oz) (02/27 0500) Weight change: -0.765 kg (-1 lb 11 oz) Last BM Date: 11/11/12  Intake/Output from previous day: 02/26 0701 - 02/27 0700 In: 2115.8 [P.O.:640; I.V.:813.3; Blood:662.5] Out: 1450 [Urine:1450]  PHYSICAL EXAM General appearance: alert, cooperative and mild distress Resp: clear to auscultation bilaterally Cardio: regular rate and rhythm, S1, S2 normal, no murmur, click, rub or gallop GI: soft, non-tender; bowel sounds normal; no masses,  no organomegaly Extremities: extremities normal, atraumatic, no cyanosis or edema  Lab Results:    Basic Metabolic Panel:  Recent Labs  16/10/96 0520  NA 140  K 3.0*  CL 103  CO2 30  GLUCOSE 89  BUN 7  CREATININE 0.77  CALCIUM 7.9*   Liver Function Tests: No results found for this basename: AST, ALT, ALKPHOS, BILITOT, PROT, ALBUMIN,  in the last 72 hours No results found for this basename: LIPASE, AMYLASE,  in the last 72 hours No results found for this basename: AMMONIA,  in the last 72 hours CBC:  Recent Labs  11/11/12 1638 11/12/12 0010 11/12/12 0520  WBC 14.4*  --  11.6*  NEUTROABS  --   --  7.9*  HGB 8.4* 10.4* 11.1*  HCT 24.2* 29.8* 32.4*  MCV 87.4  --  85.5  PLT 222  --  241   Cardiac Enzymes: No results found for this basename: CKTOTAL, CKMB, CKMBINDEX, TROPONINI,  in the last 72 hours BNP: No results found for this basename: PROBNP,  in the last 72 hours D-Dimer: No results found for this basename:  DDIMER,  in the last 72 hours CBG:  Recent Labs  11/11/12 1052 11/11/12 1652 11/11/12 2022 11/11/12 2353 11/12/12 0527 11/12/12 0755  GLUCAP 141* 117* 109* 95 88 95   Hemoglobin A1C: No results found for this basename: HGBA1C,  in the last 72 hours Fasting Lipid Panel: No results found for this basename: CHOL, HDL, LDLCALC, TRIG, CHOLHDL, LDLDIRECT,  in the last 72 hours Thyroid Function Tests: No results found for this basename: TSH, T4TOTAL, FREET4, T3FREE, THYROIDAB,  in the last 72 hours Anemia Panel: No results found for this basename: VITAMINB12, FOLATE, FERRITIN, TIBC, IRON, RETICCTPCT,  in the last 72 hours Coagulation: No results found for this basename: LABPROT, INR,  in the last 72 hours Urine Drug Screen: Drugs of Abuse  No results found for this basename: labopia, cocainscrnur, labbenz, amphetmu, thcu, labbarb    Alcohol Level: No results found for this basename: ETH,  in the last 72 hours Urinalysis: No results found for this basename: COLORURINE, APPERANCEUR, LABSPEC, PHURINE, GLUCOSEU, HGBUR, BILIRUBINUR, KETONESUR, PROTEINUR, UROBILINOGEN, NITRITE, LEUKOCYTESUR,  in the last 72 hours Misc. Labs:  ABGS No results found for this basename: PHART, PCO2, PO2ART, TCO2, HCO3,  in the last 72 hours CULTURES Recent Results (from the past 240 hour(s))  URINE CULTURE     Status: None   Collection Time    11/05/12 10:20 PM      Result Value  Range Status   Specimen Description URINE, RANDOM   Final   Special Requests none   Final   Culture  Setup Time 11/05/2012 22:30   Final   Colony Count >=100,000 COLONIES/ML   Final   Culture ESCHERICHIA COLI   Final   Report Status 11/07/2012 FINAL   Final   Organism ID, Bacteria ESCHERICHIA COLI   Final  CLOSTRIDIUM DIFFICILE BY PCR     Status: None   Collection Time    11/06/12  4:54 PM      Result Value Range Status   C difficile by pcr NEGATIVE  NEGATIVE Final   Studies/Results: No results found.  Medications:   Prior to Admission:  Prescriptions prior to admission  Medication Sig Dispense Refill  . allopurinol (ZYLOPRIM) 100 MG tablet Take 100 mg by mouth daily.      Marland Kitchen aspirin EC 81 MG tablet Take 81 mg by mouth daily.      . baclofen (LIORESAL) 20 MG tablet Take 20 mg by mouth 2 (two) times daily.       . cilostazol (PLETAL) 50 MG tablet Take 50 mg by mouth 2 (two) times daily.      . clopidogrel (PLAVIX) 75 MG tablet Take 75 mg by mouth daily.      Marland Kitchen diltiazem (DILACOR XR) 120 MG 24 hr capsule Take 120 mg by mouth daily.      . diphenoxylate-atropine (LOMOTIL) 2.5-0.025 MG per tablet       . Folic Acid-Vit B6-Vit B12 (FOLBEE) 2.5-25-1 MG TABS Take 1 tablet by mouth daily.      . furosemide (LASIX) 40 MG tablet Take 40 mg by mouth daily.      Marland Kitchen glipiZIDE (GLUCOTROL XL) 2.5 MG 24 hr tablet Take 2.5 mg by mouth daily.      Marland Kitchen HYDROcodone-acetaminophen (NORCO) 10-325 MG per tablet Take 1 tablet by mouth 2 (two) times daily as needed. For pain      . metoprolol (LOPRESSOR) 50 MG tablet Take 25 mg by mouth daily.       . pantoprazole (PROTONIX) 40 MG tablet Take 40 mg by mouth daily.      . ramipril (ALTACE) 2.5 MG capsule Take 2.5 mg by mouth daily.      . simvastatin (ZOCOR) 20 MG tablet Take 20 mg by mouth every evening.      Marland Kitchen SPIRIVA HANDIHALER 18 MCG inhalation capsule Place 18 mcg into inhaler and inhale daily.       Marland Kitchen tiZANidine (ZANAFLEX) 4 MG tablet Take 2-8 mg by mouth 4 (four) times daily. Patient takes 8mg  at 11am and 8mg  at 11pm and 2mg  twice a day      . traZODone (DESYREL) 50 MG tablet Take 100 mg by mouth at bedtime.      Marland Kitchen zolpidem (AMBIEN CR) 12.5 MG CR tablet Take 12.5 mg by mouth at bedtime. For sleep       Scheduled: . allopurinol  100 mg Oral Daily  . baclofen  10 mg Oral BID WC   And  . baclofen  20 mg Oral Custom  . cefTRIAXone (ROCEPHIN) IVPB 1 gram/50 mL D5W  1 g Intravenous Q24H  . cilostazol  50 mg Oral BID  . diltiazem  120 mg Oral Daily  . hydrocortisone   Rectal  QID  . metoprolol  25 mg Oral Daily  . pantoprazole (PROTONIX) IV  40 mg Intravenous Q12H  . potassium chloride  20 mEq Oral Daily  . tiotropium  18 mcg Inhalation Daily  . tiZANidine  2 mg Oral BID WC   And  . tiZANidine  4 mg Oral Custom  . traZODone  100 mg Oral QHS   Continuous: . sodium chloride Stopped (11/11/12 1733)   ZOX:[id number], acetaminophen, albuterol, HYDROcodone-acetaminophen, LORazepam, morphine injection, ondansetron (ZOFRAN) IV, ondansetron, zolpidem  Assesment: He was admitted with partial small bowel obstruction and has had some GI bleeding. He required blood transfusion. He is markedly improved and his hemoglobin was stable. He has multiple other medical problems all of which appear to be stable. Principal Problem:   Partial small bowel obstruction Active Problems:   DIABETES, TYPE 2   CAD   PERIPHERAL VASCULAR DISEASE   [facility]   RENAL INSUFFICIENCY   Peripheral neuropathy   Alternating constipation and diarrhea   Dehydration   Hypokalemia   Gastrointestinal bleeding, lower   Acute blood loss anemia    Plan: He will be having physical therapy consult. He will continue his other treatments. He probably can be discharged home tomorrow    LOS: 7 days   Roth Ress L 11/12/2012, 8:58 AM

## 2012-11-12 NOTE — Evaluation (Signed)
Physical Therapy Evaluation Patient Details Name: Chad Morse MRN: [id number] DOB: [birth year] Today's Date: 11/12/2012 Time: 7829-5621 PT Time Calculation (min): 51 min  PT Assessment / Plan / Recommendation Clinical Impression  Pt was seen for eval and found to be mildly deconditioned from baseline.  He lives with wife and is normally independent with all ADLs PTA.  Currently, he is able to transfer in/out of bed independently and able to ambulate 100' with one rest using his RW.  His major limitation is dyspnea with exertion and had a significant decrease in his O2 sat on RA after walking 50' (decreased to 81%).  After sitting for about 2 min, O2 sat came up to 91%.  Pt adamantly refuses to use O2.  He states that he knows his own body and can guage his own activity level.Marland Kitchen  He also declines HHPT.    PT Assessment  Patient needs continued PT services    Follow Up Recommendations  No PT follow up    Does the patient have the potential to tolerate intense rehabilitation      Barriers to Discharge None      Equipment Recommendations  None recommended by PT    Recommendations for Other Services     Frequency Min 3X/week    Precautions / Restrictions Precautions Precautions: Fall Restrictions Weight Bearing Restrictions: No   Pertinent Vitals/Pain       Mobility  Bed Mobility Bed Mobility: Supine to Sit;Sit to Supine Supine to Sit: 6: Modified independent (Device/Increase time);HOB flat Sit to Supine: 6: Modified independent (Device/Increase time);HOB flat Transfers Transfers: Sit to Stand;Stand to Sit Sit to Stand: 6: Modified independent (Device/Increase time) Stand to Sit: 6: Modified independent (Device/Increase time) Ambulation/Gait Ambulation/Gait Assistance: 6: Modified independent (Device/Increase time) Ambulation Distance (Feet): 100 Feet Assistive device: 4-wheeled walker Gait Pattern: Within Functional Limits;Right foot flat;Left foot flat Gait  velocity: fairly rapid pace...pt instructed to slow down Stairs: No Wheelchair Mobility Wheelchair Mobility: No    Exercises General Exercises - Lower Extremity Ankle Circles/Pumps: AROM;Both;10 reps;Supine Heel Slides: AROM;Both;10 reps;Supine Hip ABduction/ADduction: AROM;Both;10 reps;Supine Straight Leg Raises: AROM;Both;10 reps;Supine   PT Diagnosis: Difficulty walking  PT Problem List: Decreased activity tolerance;Cardiopulmonary status limiting activity PT Treatment Interventions: Gait training;Therapeutic exercise   PT Goals Acute Rehab PT Goals PT Goal Formulation: With patient Time For Goal Achievement: 11/19/12 Potential to Achieve Goals: Good Pt will Ambulate: 51 - 150 feet;with supervision;with rolling walker PT Goal: Ambulate - Progress: Goal set today (with no rest)  Visit Information  Last PT Received On: 11/12/12    Subjective Data  Subjective: I haven't been out of bed Patient Stated Goal: return home   Prior Functioning  Home Living Lives With: Spouse Available Help at Discharge: Family;Available 24 hours/day Type of Home: House Home Access: Ramped entrance Home Layout: One level Bathroom Shower/Tub: Engineer, manufacturing systems: Standard Bathroom Accessibility: Yes How Accessible: Accessible via walker Home Adaptive Equipment: Bedside commode/3-in-1;Grab bars in shower;Hospital bed;Shower chair with back;Straight cane;Walker - four wheeled;Wheelchair - manual Prior Function Level of Independence: Independent with assistive device(s) Able to Take Stairs?: No Driving: No Vocation: Retired Musician: No difficulties    Copywriter, advertising Overall Cognitive Status: Appears within functional limits for tasks assessed/performed Arousal/Alertness: Awake/alert Orientation Level: Appears intact for tasks assessed Behavior During Session: [facility] for tasks performed    Extremity/Trunk Assessment Right Upper Extremity Assessment RUE  ROM/Strength/Tone: [facility] for tasks assessed Left Upper Extremity Assessment LUE ROM/Strength/Tone: [facility] for tasks assessed Right Lower  Extremity Assessment RLE ROM/Strength/Tone: WFL for tasks assessed RLE Sensation: History of peripheral neuropathy RLE Coordination: WFL - gross motor Left Lower Extremity Assessment LLE ROM/Strength/Tone: WFL for tasks assessed LLE Sensation: History of peripheral neuropathy LLE Coordination: WFL - gross motor Trunk Assessment Trunk Assessment: Normal   Balance Balance Balance Assessed: No  End of Session PT - End of Session Equipment Utilized During Treatment: Gait belt Activity Tolerance: Patient tolerated treatment well (does have decreased O2 sat on RA with gait) Patient left: in chair;with call bell/phone within reach;with chair alarm set  GP     Konrad Penta 11/12/2012, 10:52 AM

## 2012-11-13 LAB — CBC WITH DIFFERENTIAL/PLATELET
Eosinophils Absolute: 0.2 10*3/uL (ref 0.0–0.7)
Eosinophils Relative: 1 % (ref 0–5)
Hemoglobin: 11.2 g/dL — ABNORMAL LOW (ref 13.0–17.0)
Lymphs Abs: 2.2 10*3/uL (ref 0.7–4.0)
MCH: 29.8 pg (ref 26.0–34.0)
MCV: 86.7 fL (ref 78.0–100.0)
Monocytes Absolute: 1.1 10*3/uL — ABNORMAL HIGH (ref 0.1–1.0)
Monocytes Relative: 10 % (ref 3–12)
RBC: 3.76 MIL/uL — ABNORMAL LOW (ref 4.22–5.81)

## 2012-11-13 MED ORDER — POTASSIUM CHLORIDE CRYS ER 20 MEQ PO TBCR: 40.0000 meq | EXTENDED_RELEASE_TABLET | Freq: Once | ORAL | Status: DC

## 2012-11-13 MED ORDER — ONDANSETRON HCL 4 MG PO TABS: 4.0000 mg | ORAL_TABLET | Freq: Four times a day (QID) | ORAL | Status: DC | PRN

## 2012-11-13 NOTE — Plan of Care (Signed)
Problem: Discharge Progression Outcomes Goal: Discharge plan in place and appropriate Outcome: Adequate for Discharge D/c home with wife and home health

## 2012-11-15 NOTE — Discharge Summary (Signed)
Physician Discharge Summary  Patient ID: Chad Morse MRN: [id number] DOB/AGE: 1941-03-08 72 y.o. Primary Care Physician:HAWKINS,EDWARD L, MD Admit date: 11/05/2012 Discharge date: 11/15/2012    Discharge Diagnoses:   Principal Problem:   Partial small bowel obstruction Active Problems:   DIABETES, TYPE 2   CAD   PERIPHERAL VASCULAR DISEASE   C [facility]   RENAL INSUFFICIENCY   Peripheral neuropathy   Alternating constipation and diarrhea   Dehydration   Hypokalemia   Gastrointestinal bleeding, lower   Acute blood loss anemia     Medication List    STOP taking these medications       aspirin EC 81 MG tablet     cilostazol 50 MG tablet  Commonly known as:  PLETAL     clopidogrel 75 MG tablet  Commonly known as:  PLAVIX     traZODone 50 MG tablet  Commonly known as:  DESYREL     zolpidem 12.5 MG CR tablet  Commonly known as:  AMBIEN CR      TAKE these medications       allopurinol 100 MG tablet  Commonly known as:  ZYLOPRIM  Take 100 mg by mouth daily.     baclofen 20 MG tablet  Commonly known as:  LIORESAL  Take 20 mg by mouth 2 (two) times daily.     diltiazem 120 MG 24 hr capsule  Commonly known as:  DILACOR XR  Take 120 mg by mouth daily.     diphenoxylate-atropine 2.5-0.025 MG per tablet  Commonly known as:  LOMOTIL     Folic Acid-Vit B6-Vit B12 2.5-25-1 MG Tabs  Commonly known as:  FOLBEE  Take 1 tablet by mouth daily.     furosemide 40 MG tablet  Commonly known as:  LASIX  Take 40 mg by mouth daily.     glipiZIDE 2.5 MG 24 hr tablet  Commonly known as:  GLUCOTROL XL  Take 2.5 mg by mouth daily.     HYDROcodone-acetaminophen 10-325 MG per tablet  Commonly known as:  NORCO  Take 1 tablet by mouth 2 (two) times daily as needed. For pain     metoprolol 50 MG tablet  Commonly known as:  LOPRESSOR  Take 25 mg by mouth daily.     ondansetron 4 MG tablet  Commonly known as:  ZOFRAN  Take 1 tablet (4 mg total) by mouth every 6 (six)  hours as needed for nausea.     pantoprazole 40 MG tablet  Commonly known as:  PROTONIX  Take 40 mg by mouth daily.     ramipril 2.5 MG capsule  Commonly known as:  ALTACE  Take 2.5 mg by mouth daily.     simvastatin 20 MG tablet  Commonly known as:  ZOCOR  Take 20 mg by mouth every evening.     SPIRIVA HANDIHALER 18 MCG inhalation capsule  Generic drug:  tiotropium  Place 18 mcg into inhaler and inhale daily.     tiZANidine 4 MG tablet  Commonly known as:  ZANAFLEX  Take 2-8 mg by mouth 4 (four) times daily. Patient takes 8mg  at 11am and 8mg  at 11pm and 2mg  twice a day        Discharged Condition: Improved    Consults: Gastroenterology  Significant Diagnostic Studies: X-ray Chest Pa And Lateral   11/05/2012  *RADIOLOGY REPORT*  Clinical Data: Abdominal pain and distention.  Shortness of breath and weakness.  COPD.  CHEST - 2 VIEW  Comparison: 01/24/2012  Findings: Cardiomegaly.  COPD.  No active infiltrates or failure. No effusion or pneumothorax.  Osteopenia.  No visible free air.  IMPRESSION: COPD, no active disease.   Original Report Authenticated By: Davonna Belling, M.D.    Ct Abdomen Pelvis W Contrast  11/06/2012  *RADIOLOGY REPORT*  Clinical Data: Abdominal pain and diarrhea.  CT ABDOMEN AND PELVIS WITH CONTRAST  Technique:  Multidetector CT imaging of the abdomen and pelvis was performed following the standard protocol during bolus administration of intravenous contrast.  Contrast:  100 ml Omnipaque-300.  Comparison: Plain films of the abdomen 11/05/2012 and CT abdomen and pelvis 08/21/2012.  Findings: Mild dependent atelectasis present in the lung bases.  No pleural or pericardial effusion.  Heart size is normal. Atherosclerotic vascular disease is noted.  As seen on plain films, the patient has a small bowel obstruction with dilated loops of small bowel measuring up to 4.5 cm in diameter and multiple air-fluid levels identified.  The transition point appears to be in the  left lower quadrant where there is an angulated loop of small bowel.  There is no pneumatosis, free intraperitoneal air, portal venous gas or focal fluid collection. No mass is identified.  The patient has extensive diverticular disease of the colon, worse in the sigmoid without diverticulitis. The colon is otherwise unremarkable.  There is no lymphadenopathy.  A few small stones are seen within the gallbladder without evidence of cholecystitis.  There is a small amount of perihepatic ascites. There is unchanged 0.9 cm lesion in the dome of the liver in the left hepatic lobe on image 15.  The liver otherwise appears normal. The spleen, adrenal glands and pancreas are unremarkable.  Single bilateral renal cysts are unchanged.  No focal bony abnormality is identified.  The patient is status post left hip replacement.  IMPRESSION:  1.  Study is positive for small bowel obstruction which appears to be due to adhesions.  There is no evidence of bowel ischemia. Small amount of associated perihepatic ascites is noted. 2.  Diverticulosis without diverticulitis. 3.  Small gallstones without evidence of cholecystitis.   Original Report Authenticated By: Holley Dexter, M.D.    Dg Abd 2 Views  11/05/2012  *RADIOLOGY REPORT*  Clinical Data: Abdominal pain and distention.  Weakness.  Diabetes.  ABDOMEN - 2 VIEW  Comparison: CT abdomen and pelvis 08/21/2012.  Findings: Distended small bowel loops with air-fluid levels concerning for a small bowel obstruction. No definite free air. There is a paucity of distal colonic gas.  Previous left hip replacement.  Heterotopic ossification surrounds the left hip.  IMPRESSION: Small bowel obstruction.  No definite free air.   Original Report Authenticated By: Davonna Belling, M.D.    Dg Abd Acute W/chest  11/09/2012  *RADIOLOGY REPORT*  Clinical Data: Follow-up small bowel obstruction.  ACUTE ABDOMEN SERIES (ABDOMEN 2 VIEW & CHEST 1 VIEW)  Comparison: Plain films 11/05/2012.  CT  11/06/2012  Findings: There is hyperinflation of the lungs compatible with COPD.  Heart is borderline in size.  Mild interstitial prominence throughout the lungs, increased throughout the right lung since prior study.  This may in part be related to rotation.  The patient is significantly rotated to the left.  No effusions.  NG tube enters the stomach.  Continue small bowel dilatation compatible with small bowel obstruction.  This remains a moderate to high-grade but likely slightly improved since prior study.  No free air.  No organomegaly or suspicious calcification.  No acute bony abnormality.  IMPRESSION: Continue  small bowel obstruction with slight improvement since prior study.  COPD.   Original Report Authenticated By: Charlett Nose, M.D.     Lab Results: Basic Metabolic Panel:  Recent Labs  45/40/98 0507  K 3.3*   Liver Function Tests: No results found for this basename: AST, ALT, ALKPHOS, BILITOT, PROT, ALBUMIN,  in the last 72 hours   CBC:  Recent Labs  11/13/12 0507  WBC 11.1*  NEUTROABS 7.6  HGB 11.2*  HCT 32.6*  MCV 86.7  PLT 262    Recent Results (from the past 240 hour(s))  URINE CULTURE     Status: None   Collection Time    11/05/12 10:20 PM      Result Value Range Status   Specimen Description URINE, RANDOM   Final   Special Requests none   Final   Culture  Setup Time 11/05/2012 22:30   Final   Colony Count >=100,000 COLONIES/ML   Final   Culture ESCHERICHIA COLI   Final   Report Status 11/07/2012 FINAL   Final   Organism ID, Bacteria ESCHERICHIA COLI   Final  CLOSTRIDIUM DIFFICILE BY PCR     Status: None   Collection Time    11/06/12  4:54 PM      Result Value Range Status   C difficile by pcr NEGATIVE  NEGATIVE Final     Hospital Course: He was admitted with abdominal discomfort nausea and vomiting and seemed to have a partial small bowel obstruction. He was able to pass gas but not have a bowel movement. CT confirmed what appeared to be a partial small  bowel obstruction. He was treated with nasogastric drainage fluids medications but then developed GI bleeding and required 2 units of packed red blood cells. His GI bleeding was thought to be diverticular in nature but he did not have a endoscopy to confirm because he had just had colonoscopy within the last 18 months. His bleeding resolved his hemoglobin stabilized he was able to eat solid food and able to ambulate in the hall. He was discharged home in improved condition  Discharge Exam: Blood pressure 146/84, pulse 67, temperature 97.9 F (36.6 C), temperature source Oral, resp. rate 18, height 5\' 6"  (1.676 m), weight 102.785 kg (226 lb 9.6 oz), SpO2 91.00%. He is awake and alert. His chest is clear. His heart is regular. His abdomen is soft now without masses and bowel sounds are present and active  Disposition: Home with home health services      Discharge Orders   Future Appointments [facility] Department Dept Phone   01/04/2013 2:30 PM Malissa Hippo, MD [facility] [phone]   Future Orders Complete By Expires     Discharge patient  As directed     Face-to-face encounter (required for Medicare/Medicaid patients)  As directed     Comments:      I HAWKINS,EDWARD L certify that this patient is under my care and that I, or a nurse practitioner or physician's assistant working with me, had a face-to-face encounter that meets the physician face-to-face encounter requirements with this patient on 11/13/2012. The encounter with the patient was in whole, or in part for the following medical condition(s) which is the primary reason for home health care (List medical condition): abdominal pain/ gi bleed    Questions:      The encounter with the patient was in whole, or in part, for the following medical condition, which is the primary reason for home health care:  abdominal pain gi bleed    I certify that, based on my findings, the following services are medically necessary  home health services:  Nursing    My clinical findings support the need for the above services:  Unable to leave home safely without assistance and/or assistive device    Further, I certify that my clinical findings support that this patient is homebound due to:  Ambulates short distances less than 300 feet    Reason for Medically Necessary Home Health Services:  Skilled Nursing- Skilled Assessment/Observation    Home Health  As directed     Scheduling Instructions:      Cbc and BMET on Monday 11/16/2012    Questions:      To provide the following care/treatments:  RN       Follow-up Information   Follow up with HAWKINS,EDWARD L, MD On 11/23/2012. (9am)    Contact information:   [address] [place] [address] [phone]       Follow up with [facility].   Contact information:   [address] [place] [address] [phone]      Signed: Fredirick Maudlin Pager [phone]  11/15/2012, 9:27 AM

## 2013-01-04 ENCOUNTER — Ambulatory Visit (INDEPENDENT_AMBULATORY_CARE_PROVIDER_SITE_OTHER): Payer: Medicare Other | Admitting: Internal Medicine

## 2013-01-07 ENCOUNTER — Encounter (INDEPENDENT_AMBULATORY_CARE_PROVIDER_SITE_OTHER): Payer: Self-pay | Admitting: Internal Medicine

## 2013-01-07 ENCOUNTER — Ambulatory Visit (INDEPENDENT_AMBULATORY_CARE_PROVIDER_SITE_OTHER): Payer: Medicare Other | Admitting: Internal Medicine

## 2013-01-07 VITALS — BP 116/70 | HR 68 | Temp 97.8°F | Resp 18 | Ht 66.0 in | Wt 203.2 lb

## 2013-01-07 DIAGNOSIS — K219 Gastro-esophageal reflux disease without esophagitis: Secondary | ICD-10-CM

## 2013-01-07 DIAGNOSIS — K6389 Other specified diseases of intestine: Secondary | ICD-10-CM

## 2013-01-07 DIAGNOSIS — R198 Other specified symptoms and signs involving the digestive system and abdomen: Secondary | ICD-10-CM

## 2013-01-07 MED ORDER — PSYLLIUM 28 % PO PACK: 1.0000 | PACK | Freq: Every day | ORAL | Status: DC

## 2013-01-07 NOTE — Progress Notes (Signed)
Presenting complaint;  Followup for irregular bowel habits GERD.  Subjective:  Patient is 72 year old Caucasian male who is here for scheduled visit accompanied by his wife. He was last seen over 2 months ago when he was admitted to Dr. Juanetta Gosling service. He had small bowel obstruction. He responded to medical therapy. He also experienced lower GI bleed and received transfusion. He had been on 2 antiplatelet agents. He states he is feeling 100% better. He is eating yogurt daily. He has stopped taking Ambien on and using hydrocodone normal than 1 tablet a day. He has taken one dose of Senokot in the last 2 months. He is having formed stools usually one or 2 a day. He may occasionally skip a day. He was begun on there but 2 weeks ago and it has helped a great deal with his neuropathy. His appetite is good and he denies melena or rectal bleeding.  Current Medications: Current Outpatient Prescriptions  Medication Sig Dispense Refill  . allopurinol (ZYLOPRIM) 100 MG tablet Take 100 mg by mouth daily.      Marland Kitchen ALPRAZolam (XANAX) 1 MG tablet Take 1 mg by mouth as needed.       . baclofen (LIORESAL) 20 MG tablet Take 20 mg by mouth 2 (two) times daily.       . cilostazol (PLETAL) 50 MG tablet Take 50 mg by mouth 2 (two) times daily.       . clopidogrel (PLAVIX) 75 MG tablet Take 75 mg by mouth daily.       Marland Kitchen diltiazem (DILACOR XR) 120 MG 24 hr capsule Take 120 mg by mouth daily.      . Folic Acid-Vit B6-Vit B12 (FOLBEE) 2.5-25-1 MG TABS Take 1 tablet by mouth daily.      . furosemide (LASIX) 40 MG tablet Take 40 mg by mouth daily.      Marland Kitchen glipiZIDE (GLUCOTROL XL) 2.5 MG 24 hr tablet Take 2.5 mg by mouth daily.      Marland Kitchen HYDROcodone-acetaminophen (NORCO) 10-325 MG per tablet Take 1 tablet by mouth daily. For pain      . KLOR-CON M20 20 MEQ tablet Take 20 mEq by mouth daily.       . metoprolol (LOPRESSOR) 50 MG tablet Take 25 mg by mouth daily.       . pantoprazole (PROTONIX) 40 MG tablet Take 40 mg by mouth  daily.      . pregabalin (LYRICA) 100 MG capsule Take 100 mg by mouth daily.      . ramipril (ALTACE) 2.5 MG capsule Take 2.5 mg by mouth daily.      . simvastatin (ZOCOR) 20 MG tablet Take 20 mg by mouth every evening.      Marland Kitchen SPIRIVA HANDIHALER 18 MCG inhalation capsule Place 18 mcg into inhaler and inhale daily.       Marland Kitchen tiZANidine (ZANAFLEX) 4 MG tablet Take 2-8 mg by mouth 4 (four) times daily. Patient takes 8mg  at 11am and 8mg  at 11pm and 2mg  twice a day       No current facility-administered medications for this visit.     Objective: Blood pressure 116/70, pulse 68, temperature 97.8 F (36.6 C), temperature source Oral, resp. rate 18, height 5\' 6"  (1.676 m), weight 203 lb 3.2 oz (92.171 kg). Patient is alert and in no acute distress. Conjunctiva is pink. Sclera is nonicteric Oropharyngeal mucosa is normal. No neck masses or thyromegaly noted. Cardiac exam with regular rhythm normal S1 and S2. No murmur or gallop noted.  Auscultation of lungs revealed diminished intensity of breath sounds bilaterally. Abdomen is full but soft and nontender without organomegaly or masses.  No LE edema or clubbing noted.    Assessment:  #1. Irregular bowel habits consistent with IBS. Since he's been off trazodone and using lesser dose of pain medication he is not constipated anymore. He is having mild diarrhea. #2. GERD. Symptoms well controlled with therapy. #3. History of anemia secondary to GI bleed during recent hospitalization.   Plan:  Metamucil 3-4 g by mouth daily. Hemoccult x1 his stools black. Await results of blood work to be done today. Office visit in 6 month

## 2013-01-07 NOTE — Patient Instructions (Addendum)
Take Metamucil one pack or 3-4 g by mouth daily at bedtime. Can use Dulcolax or glycerin suppository if you go 2 days without a bowel movement. Hemoccult if stools black

## 2013-01-29 ENCOUNTER — Other Ambulatory Visit: Payer: Self-pay | Admitting: *Deleted

## 2013-01-29 MED ORDER — APIXABAN 5 MG PO TABS: 5.0000 mg | ORAL_TABLET | Freq: Two times a day (BID) | ORAL | Status: DC

## 2013-01-29 NOTE — Telephone Encounter (Signed)
Hard copy of perscription  (eliquis 5 mg)  Given to pt.

## 2013-02-02 ENCOUNTER — Other Ambulatory Visit: Payer: Self-pay | Admitting: *Deleted

## 2013-02-02 ENCOUNTER — Encounter: Payer: Self-pay | Admitting: Cardiovascular Disease

## 2013-02-02 ENCOUNTER — Ambulatory Visit (HOSPITAL_COMMUNITY)
Admission: RE | Admit: 2013-02-02 | Discharge: 2013-02-02 | Disposition: A | Discharge status: Home / Self Care | Payer: Medicare Other | Source: Ambulatory Visit | Attending: Cardiovascular Disease | Admitting: Cardiovascular Disease

## 2013-02-02 DIAGNOSIS — J449 Chronic obstructive pulmonary disease, unspecified: Secondary | ICD-10-CM | POA: Insufficient documentation

## 2013-02-02 DIAGNOSIS — I4891 Unspecified atrial fibrillation: Secondary | ICD-10-CM

## 2013-02-02 DIAGNOSIS — J4489 Other specified chronic obstructive pulmonary disease: Secondary | ICD-10-CM | POA: Insufficient documentation

## 2013-02-02 DIAGNOSIS — E119 Type 2 diabetes mellitus without complications: Secondary | ICD-10-CM | POA: Insufficient documentation

## 2013-02-02 DIAGNOSIS — R079 Chest pain, unspecified: Secondary | ICD-10-CM | POA: Insufficient documentation

## 2013-02-02 DIAGNOSIS — I2510 Atherosclerotic heart disease of native coronary artery without angina pectoris: Secondary | ICD-10-CM | POA: Insufficient documentation

## 2013-02-02 NOTE — Progress Notes (Signed)
*  PRELIMINARY RESULTS* Echocardiogram 2D Echocardiogram has been performed.  Conrad [place]itan 02/02/2013, 1:55 PM

## 2013-02-23 ENCOUNTER — Other Ambulatory Visit (HOSPITAL_COMMUNITY): Payer: Self-pay | Admitting: Cardiovascular Disease

## 2013-02-24 ENCOUNTER — Other Ambulatory Visit (HOSPITAL_COMMUNITY): Payer: Self-pay | Admitting: Cardiovascular Disease

## 2013-02-24 DIAGNOSIS — R0989 Other specified symptoms and signs involving the circulatory and respiratory systems: Secondary | ICD-10-CM

## 2013-03-09 ENCOUNTER — Encounter: Payer: Self-pay | Admitting: Cardiovascular Disease

## 2013-03-16 ENCOUNTER — Other Ambulatory Visit: Payer: Self-pay | Admitting: Cardiovascular Disease

## 2013-03-16 LAB — CBC WITH DIFFERENTIAL/PLATELET
Basophils Absolute: 0.1 10*3/uL (ref 0.0–0.1)
Eosinophils Absolute: 0.2 10*3/uL (ref 0.0–0.7)
Eosinophils Relative: 2 % (ref 0–5)
Lymphs Abs: 1.6 10*3/uL (ref 0.7–4.0)
MCH: 22.6 pg — ABNORMAL LOW (ref 26.0–34.0)
MCV: 72.3 fL — ABNORMAL LOW (ref 78.0–100.0)
Monocytes Absolute: 1.1 10*3/uL — ABNORMAL HIGH (ref 0.1–1.0)
Platelets: 218 10*3/uL (ref 150–400)
RDW: 17.4 % — ABNORMAL HIGH (ref 11.5–15.5)

## 2013-03-16 LAB — COMPREHENSIVE METABOLIC PANEL
ALT: 14 U/L (ref 0–53)
BUN: 18 mg/dL (ref 6–23)
CO2: 35 mEq/L — ABNORMAL HIGH (ref 19–32)
Creat: 1.01 mg/dL (ref 0.50–1.35)
Total Bilirubin: 0.5 mg/dL (ref 0.3–1.2)

## 2013-03-16 LAB — LIPID PANEL
Cholesterol: 89 mg/dL (ref 0–200)
HDL: 30 mg/dL — ABNORMAL LOW (ref >39)
Total CHOL/HDL Ratio: 3 Ratio
VLDL: 19 mg/dL (ref 0–40)

## 2013-03-22 ENCOUNTER — Ambulatory Visit (HOSPITAL_COMMUNITY)
Admission: RE | Admit: 2013-03-22 | Discharge: 2013-03-22 | Disposition: A | Discharge status: Home / Self Care | Payer: Medicare Other | Source: Ambulatory Visit | Attending: Cardiovascular Disease | Admitting: Cardiovascular Disease

## 2013-03-22 DIAGNOSIS — R0989 Other specified symptoms and signs involving the circulatory and respiratory systems: Secondary | ICD-10-CM | POA: Insufficient documentation

## 2013-03-22 NOTE — Progress Notes (Signed)
Carotid Duplex Completed. Joslin Doell, RDMS, RVT  

## 2013-06-09 ENCOUNTER — Telehealth: Payer: Self-pay | Admitting: Neurology

## 2013-06-15 NOTE — Telephone Encounter (Signed)
Has been over 3 yrs since last seen.   Needs new referral from pcp.

## 2013-07-06 ENCOUNTER — Encounter: Payer: Self-pay | Admitting: Neurology

## 2013-07-06 ENCOUNTER — Ambulatory Visit (INDEPENDENT_AMBULATORY_CARE_PROVIDER_SITE_OTHER): Payer: Medicare Other | Admitting: Neurology

## 2013-07-06 VITALS — BP 127/78 | HR 96 | Temp 98.0°F | Ht 70.0 in | Wt 225.0 lb

## 2013-07-06 DIAGNOSIS — D497 Neoplasm of unspecified behavior of endocrine glands and other parts of nervous system: Secondary | ICD-10-CM

## 2013-07-06 DIAGNOSIS — G8220 Paraplegia, unspecified: Secondary | ICD-10-CM

## 2013-07-06 DIAGNOSIS — M79609 Pain in unspecified limb: Secondary | ICD-10-CM

## 2013-07-06 DIAGNOSIS — IMO0002 Reserved for concepts with insufficient information to code with codable children: Secondary | ICD-10-CM

## 2013-07-06 MED ORDER — TIZANIDINE HCL 4 MG PO TABS: 2.0000 mg | ORAL_TABLET | Freq: Four times a day (QID) | ORAL | Status: DC

## 2013-07-06 NOTE — Progress Notes (Signed)
[facility] [address] [place]. [place] [address] [phone]       OFFICE CONSULT NOTE  Chad. 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 He was initially diagnosed with peripheral neuropathy and saw Dr. Tomasa Rand neurologist in [place]. MRI scan of the lumbar spine showed mild disc degenrative changes. His gait difficulties and paresthesias got worse and he was seen at [facility] for second opinion in 2000 and treated with Elavil and Neurontin for neuropathy. Work up for etiology was negative. 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 He underwent surgery at [facility] for this. He has had intermittent episodes of spasms in his legs which was violent and at times was so severe that he would be up in the night and he in fact damaged his left hip and required hip replacement surgery. He saw Dr. Sandria Manly in our office in 2003 and was offered baclofen pump but the patient refused that. He has been on baclofen and Zanaflex tablets since then.He was last seen by Darrol Angel, NP on 12/08/2009. He has noticed improvement in his pain and spasms but it is still quite variable and can come once a week to up to once a month without prediction. He takes Tizanidine 8 milligrams twice daily and 2 mg twice daily at separate times as well has baclofen 20 mg twice daily and 10 mg twice daily at separate times. He is able to ambulate with a wheeled walker. In the last 2 weeks he has noticed increasing continuous  pain in the leg particularly at night and he has not been able to sleep despite taking Ambien. He feels sleepy during the day and catches up on his sleep during daytime. He has been on simvastatin for a long time and this has been recently discontinued I. Dr. Juanetta Gosling thinking it may be causing myalgias.  ROS:   14 system review of systems is positive for palpitations, leg swelling, wheezing, shortness of breath, feeling cold, joint pain and swelling, cramps, aching muscles, allergies, numbness, depression, not enough sleep, decreased energy, disinterest in activities, restless legs and insomnia PMH:  Past Medical History  Diagnosis Date  . COPD (chronic obstructive pulmonary disease)   . Diabetes mellitus   . Atrial fibrillation   . Peripheral neuropathy   . Gallstones   . Peripheral vascular disease     Social History:  History   Social History  . Marital Status: Married    Spouse Name: martha    Number of Children: 1  . Years of Education: 12   Occupational History  . retired    Social History Main Topics  . Smoking status: Current Every Day Smoker -- 60 years    Types: Cigarettes  . Smokeless tobacco: Never Used  . Alcohol Use: No  . Drug Use: No  . Sexual Activity: No     Comment: states has been down to 3 cigarettes/day, but "i'm quitting"   Other Topics Concern  . Not  on file   Social History Narrative  . No narrative on file    Medications:   Current Outpatient Prescriptions on File Prior to Visit  Medication Sig Dispense Refill  . allopurinol (ZYLOPRIM) 100 MG tablet Take 100 mg by mouth daily.      Marland Kitchen ALPRAZolam (XANAX) 1 MG tablet Take 1 mg by mouth as needed.       Marland Kitchen apixaban (ELIQUIS) 5 MG TABS tablet Take 1 tablet (5 mg total) by mouth 2 (two) times daily.  60 tablet  6  . baclofen (LIORESAL) 20 MG tablet Take 20 mg by mouth 2 (two) times daily. Take 10 mg morning, take 20 mg noon, 10 mg at 6 pm and 20 mg 11 pm.      . cilostazol (PLETAL) 50 MG tablet  Take 50 mg by mouth 2 (two) times daily.       Marland Kitchen diltiazem (DILACOR XR) 120 MG 24 hr capsule Take 120 mg by mouth daily.      . Folic Acid-Vit B6-Vit B12 (FOLBEE) 2.5-25-1 MG TABS Take 1 tablet by mouth daily.      . furosemide (LASIX) 40 MG tablet Take 40 mg by mouth daily.      Marland Kitchen glipiZIDE (GLUCOTROL XL) 2.5 MG 24 hr tablet Take 2.5 mg by mouth daily.      Marland Kitchen HYDROcodone-acetaminophen (NORCO) 10-325 MG per tablet Take 1 tablet by mouth 2 (two) times daily. For pain      . KLOR-CON M20 20 MEQ tablet Take 20 mEq by mouth daily.       . metoprolol (LOPRESSOR) 50 MG tablet Take 25 mg by mouth daily.       . pantoprazole (PROTONIX) 40 MG tablet Take 40 mg by mouth daily.      . ramipril (ALTACE) 2.5 MG capsule Take 2.5 mg by mouth daily.      Marland Kitchen SPIRIVA HANDIHALER 18 MCG inhalation capsule Place 18 mcg into inhaler and inhale daily.       . simvastatin (ZOCOR) 20 MG tablet Take 20 mg by mouth every evening.       No current facility-administered medications on file prior to visit.    Allergies:   Allergies  Allergen Reactions  . Ciprofloxacin Hives  . Penicillins Other (See Comments)    REACTION: hives  . Sulfonamide Derivatives Other (See Comments)    REACTION: hives    Physical Exam General: well developed, well nourished, seated, in no evident distress Head: head normocephalic and atraumatic. Orohparynx benign Neck: supple with no carotid or supraclavicular bruits Cardiovascular: regular rate and rhythm, no murmurs Musculoskeletal: no deformity Skin:  no rash/petichiae Vascular:  Normal pulses all extremities Filed Vitals:   07/06/13 1247  BP: 127/78  Pulse: 96  Temp: 98 F (36.7 C)    Neurologic Exam Mental Status: Awake and fully alert. Oriented to place and time. Recent and remote memory intact. Attention span, concentration and fund of knowledge appropriate. Mood and affect appropriate.  Cranial Nerves: Fundoscopic exam reveals sharp disc margins. Pupils equal, briskly  reactive to light. Extraocular movements full without nystagmus. Visual fields full to confrontation. Hearing intact. Facial sensation intact. Face, tongue, palate moves normally and symmetrically.  Motor: Normal bulk, tone, strength and coordination in both upper extremities. Increased tone in both lower extremities left more than right with mild hip flexor weakness 4/5 left greater than right. Normal strength at the knees. 3/5 strength at both ankles left greater than right.  Sensory.: Diminished touch and  pin prick sensation from umbilicus down bilaterally. Absent position and vibration at both ankles and toes. Left more than right. Coordination: Rapid alternating movements normal in all extremities. Finger-to-nose and heel-to-shin performed accurately bilaterally. Gait and Station: Arises from chair with  difficulty. Stance is broad based Gait demonstrates spastic ataxic gait which is wide-based. She will to stand on his toes but not on his heels. Unable to walk tandem. Uses a wheeled walker Reflexes: 1+ and symmetric in upper extremity is an exaggerated in lower extremities. No clonus.. Toes downgoing.      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    PLAN: I had a long discussion the patient and wife regarding his symptoms and discuss different options including referral for baclofen pump but the patient continues to refuse that. Increase tizanidine to 12 mg at bedtime and continue remaining doses unchanged and baclofen at current dosage. No testing indicated at current time. F/U in 2 months with Darrol Angel, NP whom he has seen in past

## 2013-07-06 NOTE — Patient Instructions (Signed)
Increase tizanidine to 12 mg at bedtime and continue remaining doses unchanged and baclofen at current dosage. No testing indicated at current time. F/U in 2 months with Darrol Angel, Np whom he has seen in past

## 2013-07-12 ENCOUNTER — Encounter (INDEPENDENT_AMBULATORY_CARE_PROVIDER_SITE_OTHER): Payer: Self-pay | Admitting: Internal Medicine

## 2013-07-12 ENCOUNTER — Ambulatory Visit (INDEPENDENT_AMBULATORY_CARE_PROVIDER_SITE_OTHER): Payer: Medicare Other | Admitting: Internal Medicine

## 2013-07-12 VITALS — BP 128/60 | HR 76 | Temp 98.0°F | Ht 72.0 in | Wt 226.8 lb

## 2013-07-12 DIAGNOSIS — K5900 Constipation, unspecified: Secondary | ICD-10-CM

## 2013-07-12 MED ORDER — LUBIPROSTONE 8 MCG PO CAPS: 8.0000 ug | ORAL_CAPSULE | Freq: Two times a day (BID) | ORAL | Status: DC

## 2013-07-12 NOTE — Patient Instructions (Addendum)
OV in 6 months. Amitiza samples x 2 boxes. Rx for sample to drug store.

## 2013-07-12 NOTE — Progress Notes (Addendum)
Subjective:     Patient ID: Chad Morse, male   DOB: 1941-09-07, 72 y.o.   MRN: [id number]  HPI Here today for f/u of his IBS. He says he is doing good. Last seen in April of this year. He is having usually 1 and sometimes 2 a day. Stools are formed and soft. If he doesn't have a BM in 2 days he will take Amitiza prn. He has tried Metamucil in the past but just could not drink it.  There is no abdominal pain.  His wife states his appetite is good. No weight loss. No melena or bright red rectal bleeding.  He was last seen in April of this year by Dr. Karilyn Cota. Hx of anemia felt to be due GI bleed. He had been on 2 antiplatelets.   H and H 13.9 and 41.9, MCV 79.1, Platelet ct 197.  HA1c 7.7 Review of Systems Current Outpatient Prescriptions  Medication Sig Dispense Refill  . allopurinol (ZYLOPRIM) 100 MG tablet Take 100 mg by mouth daily.      Marland Kitchen ALPRAZolam (XANAX) 1 MG tablet Take 1 mg by mouth as needed.       Marland Kitchen apixaban (ELIQUIS) 5 MG TABS tablet Take 1 tablet (5 mg total) by mouth 2 (two) times daily.  60 tablet  6  . baclofen (LIORESAL) 20 MG tablet Take 20 mg by mouth 2 (two) times daily. Take 10 mg morning, take 20 mg noon, 10 mg at 6 pm and 20 mg 11 pm.      . cilostazol (PLETAL) 50 MG tablet Take 50 mg by mouth 2 (two) times daily.       Marland Kitchen diltiazem (DILACOR XR) 120 MG 24 hr capsule Take 120 mg by mouth daily.      . folic acid (FOLVITE) 1 MG tablet Take 1 mg by mouth daily.      . Folic Acid-Vit B6-Vit B12 (FOLBEE) 2.5-25-1 MG TABS Take 1 tablet by mouth daily.      Marland Kitchen glipiZIDE (GLUCOTROL XL) 2.5 MG 24 hr tablet Take 2.5 mg by mouth daily.      Marland Kitchen HYDROcodone-acetaminophen (NORCO) 10-325 MG per tablet Take 1 tablet by mouth 2 (two) times daily. For pain      . KLOR-CON M20 20 MEQ tablet Take 20 mEq by mouth daily.       . metoprolol (LOPRESSOR) 50 MG tablet Take 25 mg by mouth daily.       . pantoprazole (PROTONIX) 40 MG tablet Take 40 mg by mouth daily.      . ramipril  (ALTACE) 2.5 MG capsule Take 2.5 mg by mouth daily.      . simvastatin (ZOCOR) 20 MG tablet Take 20 mg by mouth every evening.      Marland Kitchen SPIRIVA HANDIHALER 18 MCG inhalation capsule Place 18 mcg into inhaler and inhale daily.       Marland Kitchen tiZANidine (ZANAFLEX) 4 MG tablet Take 0.5-2 tablets (2-8 mg total) by mouth 4 (four) times daily. Patient takes 8mg  at 11am and 12mg  at 11pm and 2mg  twice a day  30 tablet  5  . torsemide (DEMADEX) 20 MG tablet Take 20 mg by mouth daily.      . traZODone (DESYREL) 50 MG tablet Take 50 mg by mouth daily.      Marland Kitchen zolpidem (AMBIEN CR) 12.5 MG CR tablet Take 12.5 mg by mouth daily.       No current facility-administered medications for this visit.   Past Medical  History  Diagnosis Date  . COPD (chronic obstructive pulmonary disease)   . Diabetes mellitus   . Atrial fibrillation   . Peripheral neuropathy   . Gallstones   . Peripheral vascular disease   . IBS (irritable bowel syndrome)    Past Surgical History  Procedure Laterality Date  . Back surgery     Allergies  Allergen Reactions  . Ciprofloxacin Hives  . Penicillins Other (See Comments)    REACTION: hives  . Sulfonamide Derivatives Other (See Comments)    REACTION: hives      see hpi Objective:   Physical Exam  Filed Vitals:   07/12/13 1035  BP: 128/60  Pulse: 76  Temp: 98 F (36.7 C)  Height: 6' (1.829 m)  Weight: 226 lb 12.8 oz (102.876 kg)   Alert and oriented. Skin warm and dry. Oral mucosa is moist.   . Sclera anicteric, conjunctivae is pink. Thyroid not enlarged. No cervical lymphadenopathy. Lungs clear. Heart regular rate and rhythm.  Abdomen is soft. Bowel sounds are positive. No hepatomegaly. No abdominal masses felt. No tenderness.  No edema to lower extremities.      Assessment:     IBS which is better than. He says his stools are much better. Anemia : normal now. Anemia felt to be from antiplatelet therapy    Plan:    OV in 6 months.   Amitiza samples x 2  boxes given to  patient.

## 2013-08-10 ENCOUNTER — Ambulatory Visit (INDEPENDENT_AMBULATORY_CARE_PROVIDER_SITE_OTHER): Payer: Medicare Other | Admitting: Cardiovascular Disease

## 2013-08-10 ENCOUNTER — Encounter: Payer: Self-pay | Admitting: Cardiovascular Disease

## 2013-08-10 VITALS — BP 110/72 | HR 69 | Ht 70.0 in | Wt 227.7 lb

## 2013-08-10 DIAGNOSIS — I739 Peripheral vascular disease, unspecified: Secondary | ICD-10-CM

## 2013-08-10 DIAGNOSIS — I10 Essential (primary) hypertension: Secondary | ICD-10-CM | POA: Insufficient documentation

## 2013-08-10 DIAGNOSIS — I4891 Unspecified atrial fibrillation: Secondary | ICD-10-CM

## 2013-08-10 DIAGNOSIS — E785 Hyperlipidemia, unspecified: Secondary | ICD-10-CM

## 2013-08-10 DIAGNOSIS — I2510 Atherosclerotic heart disease of native coronary artery without angina pectoris: Secondary | ICD-10-CM | POA: Insufficient documentation

## 2013-08-10 NOTE — Assessment & Plan Note (Signed)
Status post PTA and stenting of the left SFA by myself 07/14/09. He did have a known occluded right SFA. Because of symptoms of peripheral neuropathy and targeted discern whether or not he has claudication. His left lower extremity Doppler were performed/22/30 revealing a right ABI of 0.75 with an occluded right SFA a left ABI 0.93 with a patent left SFA stent.

## 2013-08-10 NOTE — Assessment & Plan Note (Signed)
*  recatheterization by Dr. Susa Griffins 03/21/04 revealing noncritical CAD left dominant system. His last Myoview performed 02/05/11 his nonischemic

## 2013-08-10 NOTE — Assessment & Plan Note (Signed)
Rate controlled on Eliquis oral anticoagulation. 

## 2013-08-10 NOTE — Patient Instructions (Signed)
Your physician wants you to follow-up in: 6 months with Luke Kilroy PAC and 1 year with Dr Berry.  You will receive a reminder letter in the mail two months in advance. If you don't receive a letter, please call our office to schedule the follow-up appointment.  

## 2013-08-10 NOTE — Assessment & Plan Note (Signed)
Well-controlled on current medications 

## 2013-08-10 NOTE — Assessment & Plan Note (Signed)
On statin therapy. Recent lipid profile performed 03/16/13 revealed a glucose of 89, LDL of 40 HDL of 30

## 2013-08-10 NOTE — Progress Notes (Signed)
08/10/2013 Chad Morse   [birth year]  [id number]  Primary Physician HAWKINS,EDWARD Elbert Ewings, MD Primary Cardiologist: [name] MD Roseanne Reno   HPI:  Chad Morse is a 72 year old moderately overweight married Caucasian male formally patient of Dr. Alanda Amass. I also see his wife the patient. He has a history of noncritical CAD by cath remotely and L5. Also history of PAD status post left SFA stenting by myself at 2010. His long history of tobacco abuse and has COPD as well as hypertension and hyperlipidemia. Peripheral neuropathy. He denies chest pain or shortness of breath process Myoview performed in 2000 is nonischemic. He does have chronic A. Fib on Eliquis  oral anticoagulation   Current Outpatient Prescriptions  Medication Sig Dispense Refill  . allopurinol (ZYLOPRIM) 100 MG tablet Take 100 mg by mouth daily.      Marland Kitchen apixaban (ELIQUIS) 5 MG TABS tablet Take 1 tablet (5 mg total) by mouth 2 (two) times daily.  60 tablet  6  . baclofen (LIORESAL) 20 MG tablet Take 20 mg by mouth 2 (two) times daily. Take 10 mg morning, take 20 mg noon, 10 mg at 6 pm and 20 mg 11 pm.      . cilostazol (PLETAL) 50 MG tablet Take 50 mg by mouth 2 (two) times daily.       Marland Kitchen diltiazem (DILACOR XR) 120 MG 24 hr capsule Take 120 mg by mouth daily.      . folic acid (FOLVITE) 1 MG tablet Take 1 mg by mouth daily.      . Folic Acid-Vit B6-Vit B12 (FOLBEE) 2.5-25-1 MG TABS Take 1 tablet by mouth daily.      . furosemide (LASIX) 40 MG tablet Take 40 mg by mouth daily.      Marland Kitchen glipiZIDE (GLUCOTROL XL) 2.5 MG 24 hr tablet Take 2.5 mg by mouth daily.      Marland Kitchen HYDROcodone-acetaminophen (NORCO) 10-325 MG per tablet Take 1 tablet by mouth 2 (two) times daily. For pain      . KLOR-CON M20 20 MEQ tablet Take 20 mEq by mouth daily.       . metoprolol (LOPRESSOR) 50 MG tablet Take 25 mg by mouth daily.       . pantoprazole (PROTONIX) 40 MG tablet Take 40 mg by mouth daily.      . ramipril (ALTACE) 2.5  MG capsule Take 2.5 mg by mouth daily.      . simvastatin (ZOCOR) 20 MG tablet Take 20 mg by mouth every evening.      Marland Kitchen SPIRIVA HANDIHALER 18 MCG inhalation capsule Place 18 mcg into inhaler and inhale daily.       Marland Kitchen tiZANidine (ZANAFLEX) 4 MG tablet Take 0.5-2 tablets (2-8 mg total) by mouth 4 (four) times daily. Patient takes 8mg  at 11am and 12mg  at 11pm and 2mg  twice a day  30 tablet  5  . traZODone (DESYREL) 50 MG tablet Take 100 mg by mouth at bedtime.       Marland Kitchen zolpidem (AMBIEN CR) 12.5 MG CR tablet Take 12.5 mg by mouth daily.      Marland Kitchen ALPRAZolam (XANAX) 1 MG tablet Take 1 mg by mouth as needed.        No current facility-administered medications for this visit.    Allergies  Allergen Reactions  . Ciprofloxacin Hives  . Penicillins Other (See Comments)    REACTION: hives  . Sulfonamide Derivatives Other (See Comments)    REACTION: hives  History   Social History  . Marital Status: Married    Spouse Name: Chad Morse    Number of Children: 1  . Years of Education: 12   Occupational History  . retired    Social History Main Topics  . Smoking status: Current Every Day Smoker -- 60 years    Types: Cigarettes  . Smokeless tobacco: Never Used     Comment: 5 cigarttes a day  . Alcohol Use: No  . Drug Use: No  . Sexual Activity: No     Comment: states has been down to 3 cigarettes/day, but "i'm quitting"   Other Topics Concern  . Not on file   Social History Narrative  . No narrative on file     Review of Systems: General: negative for chills, fever, night sweats or weight changes.  Cardiovascular: negative for chest pain, dyspnea on exertion, edema, orthopnea, palpitations, paroxysmal nocturnal dyspnea or shortness of breath Dermatological: negative for rash Respiratory: negative for cough or wheezing Urologic: negative for hematuria Abdominal: negative for nausea, vomiting, diarrhea, bright red blood per rectum, melena, or hematemesis Neurologic: negative for visual  changes, syncope, or dizziness All other systems reviewed and are otherwise negative except as noted above.    Blood pressure 110/72, pulse 69, height 5\' 10"  (1.778 m), weight 227 lb 11.2 oz (103.284 kg).  General appearance: alert and no distress Neck: no adenopathy, no carotid bruit, no JVD, supple, symmetrical, trachea midline and thyroid not enlarged, symmetric, no tenderness/mass/nodules Lungs: distant breath sounds Heart: irregularly irregular rhythm Extremities: 1+ edema with venous stasis changes  EKG atrial fibrillation with a ventricular response of 69 , right bundle branch block and left posterior fascicular block  ASSESSMENT AND PLAN:   Coronary artery disease *recatheterization by Dr. Susa Griffins 03/21/04 revealing noncritical CAD left dominant system. His last Myoview performed 02/05/11 his nonischemic  PERIPHERAL VASCULAR DISEASE Status post PTA and stenting of the left SFA by myself 07/14/09. He did have a known occluded right SFA. Because of symptoms of peripheral neuropathy and targeted discern whether or not he has claudication. His left lower extremity Doppler were performed/22/30 revealing a right ABI of 0.75 with an occluded right SFA a left ABI 0.93 with a patent left SFA stent.  Essential hypertension Well-controlled on current medications  Hyperlipidemia On statin therapy. Recent lipid profile performed 03/16/13 revealed a glucose of 89, LDL of 40 HDL of 30  Atrial fibrillation Rate controlled on Eliquis oral anticoagulation      [name] MD [facility] 08/10/2013 5:04 PM

## 2013-08-11 ENCOUNTER — Encounter: Payer: Self-pay | Admitting: Cardiovascular Disease

## 2013-08-26 ENCOUNTER — Encounter (INDEPENDENT_AMBULATORY_CARE_PROVIDER_SITE_OTHER): Payer: Self-pay

## 2013-09-06 ENCOUNTER — Ambulatory Visit (INDEPENDENT_AMBULATORY_CARE_PROVIDER_SITE_OTHER): Payer: Medicare Other | Admitting: Nurse Practitioner

## 2013-09-06 ENCOUNTER — Encounter (INDEPENDENT_AMBULATORY_CARE_PROVIDER_SITE_OTHER): Payer: Self-pay

## 2013-09-06 ENCOUNTER — Encounter: Payer: Self-pay | Admitting: Nurse Practitioner

## 2013-09-06 VITALS — BP 151/70 | HR 76 | Ht 70.0 in | Wt 234.0 lb

## 2013-09-06 DIAGNOSIS — D497 Neoplasm of unspecified behavior of endocrine glands and other parts of nervous system: Secondary | ICD-10-CM | POA: Insufficient documentation

## 2013-09-06 DIAGNOSIS — G8220 Paraplegia, unspecified: Secondary | ICD-10-CM

## 2013-09-06 DIAGNOSIS — M79609 Pain in unspecified limb: Secondary | ICD-10-CM

## 2013-09-06 NOTE — Patient Instructions (Signed)
Continue tizanidine and baclofen at current dose Followup in 6-8 months

## 2013-09-06 NOTE — Progress Notes (Signed)
I have read the note, and I agree with the clinical assessment and plan.  Cammie Faulstich,Gerold KEITH   

## 2013-09-06 NOTE — Progress Notes (Signed)
[facility]  PATIENT: Chad Morse DOB: 03/14/1941   REASON FOR VISIT: Pain in the legs and walking difficulty    HISTORY OF PRESENT ILLNESS: Chad Morse, 72 year old male returns for followup. I last saw him in 2011, he was a previous patient of Dr. Sandria Manly. His intermittent leg pain and spasms are controlled on tizanidine and baclofen. He also feels being off of his statin drug has made a difference. He has not increased his tizanidine to 12 mg at night time as he feels his previous dose was controlling his symptoms. He does not need refills. He has had no new issues since last seen Dr. Pearlean Brownie 07/06/2013.   HISTORY: chronic leg pain,  and walking difficulties since 1998. He was initially diagnosed with peripheral neuropathy and saw Dr. Tomasa Rand neurologist in [place]. MRI scan of the lumbar spine showed mild disc degenrative changes. His gait difficulties and paresthesias got worse and he was seen at [facility] for second opinion in 2000 and treated with Elavil and Neurontin for neuropathy. Work up for etiology was negative. 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 He underwent surgery at [facility] for this. He has had intermittent episodes of spasms in his legs which was violent and at times was so severe that he would be up in the night and he in fact damaged his left hip and required hip replacement surgery. He saw Dr. Sandria Manly in our office in 2003 and was offered baclofen pump but the patient refused that. He has been on baclofen and Zanaflex tablets since then.He was last seen by Darrol Angel, NP on 12/08/2009. He has noticed improvement in his pain and spasms but it is still quite variable and can come once a week to  up to once a month without prediction. He takes Tizanidine 8 milligrams twice daily and 2 mg twice daily at separate times as well has baclofen 20 mg twice daily and 10 mg twice daily at separate times. He is able to ambulate with a wheeled walker. In the last 2 weeks he has noticed increasing continuous pain in the leg particularly at night and he has not been able to sleep despite taking Ambien. He feels sleepy during the day and catches up on his sleep during daytime. He has been on simvastatin for a long time and this has been recently discontinued I. Dr. Juanetta Gosling thinking it may be causing myalgia. He also has a history of gout, history of venous stasis  versus venous insufficiency. He was admitted to the hospital in October 2010 for non healing diabetic ulcer.   Left femoral artery stenting was done by Dr. Allyson Sabal.  REVIEW OF SYSTEMS: Full 14 system review of systems performed and notable only for those listed, all others are neg:  Constitutional: N/A  Cardiovascular: leg swelling Ear/Nose/Throat: N/A  Skin: N/A  Eyes: N/A  Respiratory: N/A  Gastroitestinal: N/A  Hematology/Lymphatic: easy bruising Endocrine: N/A Musculoskeletal:N/A  Allergy/Immunology: N/A  Neurological: numbness Psychiatric: N/A   ALLERGIES: Allergies  Allergen Reactions  . Ciprofloxacin Hives  . Penicillins Other (See Comments)    REACTION: hives  . Sulfonamide Derivatives Other (See Comments)    REACTION: hives    HOME MEDICATIONS: Outpatient Prescriptions Prior to Visit  Medication Sig Dispense Refill  . allopurinol (ZYLOPRIM) 100 MG tablet Take 100 mg by mouth daily.      Marland Kitchen ALPRAZolam (XANAX) 1 MG tablet Take 1 mg by mouth as needed.       Marland Kitchen apixaban (ELIQUIS) 5 MG TABS tablet Take 1 tablet (5 mg total) by mouth 2 (two) times daily.  60 tablet  6  . baclofen (LIORESAL) 20 MG tablet Take 20 mg by mouth 2 (two) times daily. Take 10 mg morning, take 20 mg noon, 10 mg at 6 pm and 20 mg 11 pm.      .  cilostazol (PLETAL) 50 MG tablet Take 50 mg by mouth 2 (two) times daily.       Marland Kitchen diltiazem (DILACOR XR) 120 MG 24 hr capsule Take 120 mg by mouth daily.      . folic acid (FOLVITE) 1 MG tablet Take 1 mg by mouth daily.      . Folic Acid-Vit B6-Vit B12 (FOLBEE) 2.5-25-1 MG TABS Take 1 tablet by mouth daily.      . furosemide (LASIX) 40 MG tablet Take 40 mg by mouth daily.      Marland Kitchen glipiZIDE (GLUCOTROL XL) 2.5 MG 24 hr tablet Take 2.5 mg by mouth daily.      Marland Kitchen HYDROcodone-acetaminophen (NORCO) 10-325 MG per tablet Take 1 tablet by mouth 2 (two) times daily. For pain      . KLOR-CON M20 20 MEQ tablet Take 20 mEq by mouth daily.       . metoprolol (LOPRESSOR) 50 MG tablet Take 25 mg by mouth daily.       . pantoprazole (PROTONIX) 40 MG tablet Take 40 mg by mouth daily.      . ramipril (ALTACE) 2.5 MG capsule Take 2.5 mg by mouth daily.      . simvastatin (ZOCOR) 20 MG tablet Take 20 mg by mouth every evening.      Marland Kitchen SPIRIVA HANDIHALER 18 MCG inhalation capsule Place 18 mcg into inhaler and inhale daily.       . traZODone (DESYREL) 50 MG tablet Take 100 mg by mouth at bedtime.       Marland Kitchen zolpidem (AMBIEN CR) 12.5 MG CR tablet Take 12.5 mg by mouth daily.      Marland Kitchen tiZANidine (ZANAFLEX) 4 MG tablet Take 0.5-2 tablets (2-8 mg total) by mouth 4 (four) times daily. Patient takes 8mg  at 11am and 12mg  at 11pm and 2mg  twice a day  30 tablet  5   No facility-administered medications prior to visit.    PAST MEDICAL HISTORY: Past Medical History  Diagnosis Date  . COPD (chronic obstructive pulmonary disease)   . Diabetes mellitus   . Atrial fibrillation   . Peripheral neuropathy   . Gallstones   . Peripheral vascular disease   . IBS (irritable bowel syndrome)   . Peripheral arterial disease   . Tobacco abuse   . Hypertension   . Hyperlipidemia     PAST SURGICAL HISTORY: Past Surgical History  Procedure Laterality Date  . Back surgery      FAMILY HISTORY: Family History  Problem Relation Age of  Onset  . Pancreatic cancer Maternal Aunt     SOCIAL HISTORY: History   Social History  . Marital Status: Married    Spouse Name: martha    Number of Children: 1  . Years of Education: 12   Occupational History  . retired    Social History Main Topics  . Smoking status: Current  Every Day Smoker -- 60 years    Types: Cigarettes  . Smokeless tobacco: Never Used     Comment: 5 cigarttes a day  . Alcohol Use: No  . Drug Use: No  . Sexual Activity: No     Comment: states has been down to 3 cigarettes/day, but "i'm quitting"   Other Topics Concern  . Not on file   Social History Narrative  . No narrative on file     PHYSICAL EXAM  Filed Vitals:   09/06/13 1030  BP: 151/70  Pulse: 76  Height: 5\' 10"  (1.778 m)  Weight: 234 lb (106.142 kg)   Body mass index is 33.58 kg/(m^2).  Generalized: Well developed, in no acute distress  Head: normocephalic and atraumatic,. Oropharynx benign  Neck: Supple, no carotid bruits  Cardiac: Regular rate rhythm, no murmur  Musculoskeletal: No deformity   Neurological examination   Mentation: Alert oriented to time, place, history taking. Follows all commands speech and language fluent  Cranial nerve II-XII: Pupils were equal round reactive to light extraocular movements were full, visual field were full on confrontational test. Facial sensation and strength were normal. hearing was intact to finger rubbing bilaterally. Uvula tongue midline. head turning and shoulder shrug were normal and symmetric.Tongue protrusion into cheek strength was normal. Motor: normal bulk and tone, full strength in the BUE, increased tone in both lower extremities left more than right with mild hip flexor weakness left greater than right. 3/5 strength at both ankles left greater than right. Sensory:  Diminished light touch, pinprick, left more than right, absent vibration at the toes and ankles Coordination: finger-nose-finger, no dysmetria Reflexes: 1+ in the  upper extremities and absent in the lower extremities Gait and Station: Rising up from seated position without assistance, wide based  stance,  spastic ataxic gait, unable to tandem is using a rolling wheelchair   DIAGNOSTIC DATA (LABS, IMAGING, TESTING) - I reviewed patient records, labs, notes, testing and imaging myself where available.  Lab Results  Component Value Date   WBC 9.7 03/16/2013   HGB 13.4 03/16/2013   HCT 42.8 03/16/2013   MCV 72.3* 03/16/2013   PLT 218 03/16/2013      Component Value Date/Time   NA 142 03/16/2013 1139   K 4.0 03/16/2013 1139   CL 96 03/16/2013 1139   CO2 35* 03/16/2013 1139   GLUCOSE 105* 03/16/2013 1139   BUN 18 03/16/2013 1139   CREATININE 1.01 03/16/2013 1139   CREATININE 0.77 11/12/2012 0520   CALCIUM 9.1 03/16/2013 1139   PROT 6.5 03/16/2013 1139   ALBUMIN 3.7 03/16/2013 1139   AST 22 03/16/2013 1139   ALT 14 03/16/2013 1139   ALKPHOS 98 03/16/2013 1139   BILITOT 0.5 03/16/2013 1139   GFRNONAA 88* 11/12/2012 0520   GFRAA >90 11/12/2012 0520   Lab Results  Component Value Date   CHOL 89 03/16/2013   HDL 30* 03/16/2013   LDLCALC 40 03/16/2013   TRIG 97 03/16/2013   CHOLHDL 3.0 03/16/2013   Lab Results  Component Value Date   HGBA1C 6.9* 11/09/2012     ASSESSMENT AND PLAN  72 y.o. year old male  has a past medical history of chronic lower extremity pain and spasms secondary to spinal cord intramedullary tumor T5-T8 diagnosed in 2003 status post surgery at [facility]. He had chronic spastic paraplegia and intermittent leg pain and spasms which are fairly well controlled on his current regimen of baclofen and tizanidine  Continue tizanidine and baclofen at current  dose Followup in 6-8 months Nilda Riggs, [facility], [facility], APRN  [facility] [address], [address] [place], [place] [address] [phone]

## 2013-09-24 ENCOUNTER — Telehealth: Payer: Self-pay | Admitting: Cardiovascular Disease

## 2013-09-24 MED ORDER — APIXABAN 5 MG PO TABS: 5.0000 mg | ORAL_TABLET | Freq: Two times a day (BID) | ORAL | Status: DC

## 2013-09-24 NOTE — Telephone Encounter (Signed)
Need refill on his Eliquis #60

## 2013-09-24 NOTE — Telephone Encounter (Signed)
Message forwarded to K. Alvstad, PharmD.  

## 2013-09-28 ENCOUNTER — Other Ambulatory Visit (HOSPITAL_COMMUNITY): Payer: Self-pay | Admitting: Cardiovascular Disease

## 2013-09-28 DIAGNOSIS — I739 Peripheral vascular disease, unspecified: Secondary | ICD-10-CM

## 2013-10-06 ENCOUNTER — Ambulatory Visit (HOSPITAL_COMMUNITY)
Admission: RE | Admit: 2013-10-06 | Discharge: 2013-10-06 | Disposition: A | Discharge status: Home / Self Care | Payer: Medicare Other | Source: Ambulatory Visit | Attending: Cardiovascular Disease | Admitting: Cardiovascular Disease

## 2013-10-06 DIAGNOSIS — I70219 Atherosclerosis of native arteries of extremities with intermittent claudication, unspecified extremity: Secondary | ICD-10-CM | POA: Insufficient documentation

## 2013-10-06 DIAGNOSIS — I739 Peripheral vascular disease, unspecified: Secondary | ICD-10-CM

## 2013-10-06 NOTE — Progress Notes (Signed)
Arterial Duplex Lower Ext. Completed. Deshundra Waller, BS, RDMS, RVT  

## 2013-10-13 ENCOUNTER — Other Ambulatory Visit: Payer: Self-pay | Admitting: *Deleted

## 2013-10-13 MED ORDER — PANTOPRAZOLE SODIUM 40 MG PO TBEC: 40.0000 mg | DELAYED_RELEASE_TABLET | Freq: Every day | ORAL | Status: AC

## 2013-10-13 NOTE — Telephone Encounter (Signed)
Rx was sent to pharmacy electronically. 

## 2013-10-18 ENCOUNTER — Other Ambulatory Visit: Payer: Self-pay | Admitting: *Deleted

## 2013-10-18 MED ORDER — RAMIPRIL 2.5 MG PO CAPS: 2.5000 mg | ORAL_CAPSULE | Freq: Every day | ORAL | Status: AC

## 2013-10-19 ENCOUNTER — Telehealth: Payer: Self-pay | Admitting: *Deleted

## 2013-10-19 ENCOUNTER — Encounter: Payer: Self-pay | Admitting: *Deleted

## 2013-10-19 DIAGNOSIS — I739 Peripheral vascular disease, unspecified: Secondary | ICD-10-CM

## 2013-10-19 NOTE — Telephone Encounter (Signed)
Order placed for repeat lower ext dopplers in 1 year

## 2013-10-19 NOTE — Telephone Encounter (Signed)
Message copied by Chauncy Lean on Tue Oct 19, 2013 10:25 PM ------      Message from: Lorretta Harp      Created: Mon Oct 18, 2013  8:29 PM       No change from prior study. Repeat in 12 months. ------

## 2013-11-16 ENCOUNTER — Other Ambulatory Visit: Payer: Self-pay | Admitting: *Deleted

## 2013-11-16 MED ORDER — FOLIC ACID-VIT B6-VIT B12 2.5-25-1 MG PO TABS: 1.0000 | ORAL_TABLET | Freq: Every day | ORAL | Status: AC

## 2013-11-24 ENCOUNTER — Other Ambulatory Visit: Payer: Self-pay | Admitting: *Deleted

## 2013-11-24 MED ORDER — SIMVASTATIN 20 MG PO TABS: 20.0000 mg | ORAL_TABLET | Freq: Every evening | ORAL | Status: AC

## 2013-11-24 NOTE — Telephone Encounter (Signed)
Rx was sent to pharmacy electronically. 

## 2013-12-07 ENCOUNTER — Other Ambulatory Visit: Payer: Self-pay

## 2013-12-07 MED ORDER — DILTIAZEM HCL ER COATED BEADS 120 MG PO CP24: 120.0000 mg | ORAL_CAPSULE | Freq: Every day | ORAL | Status: AC

## 2013-12-07 NOTE — Telephone Encounter (Signed)
Rx was sent to pharmacy electronically. 

## 2014-01-10 ENCOUNTER — Ambulatory Visit (INDEPENDENT_AMBULATORY_CARE_PROVIDER_SITE_OTHER): Payer: Medicare Other | Admitting: Internal Medicine

## 2014-01-10 ENCOUNTER — Encounter (INDEPENDENT_AMBULATORY_CARE_PROVIDER_SITE_OTHER): Payer: Self-pay | Admitting: Internal Medicine

## 2014-01-10 VITALS — BP 104/70 | HR 84 | Temp 97.0°F | Ht 72.0 in | Wt 230.2 lb

## 2014-01-10 DIAGNOSIS — K5900 Constipation, unspecified: Secondary | ICD-10-CM

## 2014-01-10 NOTE — Progress Notes (Signed)
Subjective:     Patient ID: Chad Morse, male   DOB: 01/14/1941, 73 y.o.   MRN: [id number]  HPI 73 yr of white male here today for a scheduled visit. F/u of his IBS. Last seen in October of 2014 by me.  He says when he eats fruits,and yogurt. He takes Senekot on a daily basis.  He also takes Amitiza 64mcg once day. He tells me the Amitiza works.  He is requesting Amitiza 47mcg today (samples).  Admitted in February to [facility] for a SBO. He responded to medical therapy. Also experienced lower GI bleed and received transfusion. He had been on 2 antiplatelets.  Appetite is good. He is doing good. He is having a BM usually every 2-3 days. No weight loss.  No abdominal pain. No melena or bright red rectal bleeding.    His last colonoscopy was in 2011 by Dr. Laural Golden and was normal except for polyps.  He also had another colonoscopy by Dr. Ardis Hughs 2 weeks after his last colonoscopy in 2011 for rectal bleeding from the polyp sites.(Ulcer in ascending colon containing oozing visible vessel. Site of recent polypectomy and was treated with epi. Injection placement and placment of endoclamps. Mild diverticulosis in sigmoid colon to descending colon sections.  . Bleeding felt to be to Plavix.     Review of Systems Past Medical History  Diagnosis Date  . COPD (chronic obstructive pulmonary disease)   . Diabetes mellitus   . Atrial fibrillation   . Peripheral neuropathy   . Gallstones   . Peripheral vascular disease   . IBS (irritable bowel syndrome)   . Peripheral arterial disease   . Tobacco abuse   . Hypertension   . Hyperlipidemia     Past Surgical History  Procedure Laterality Date  . Back surgery      Allergies  Allergen Reactions  . Ciprofloxacin Hives  . Penicillins Other (See Comments)    REACTION: hives  . Sulfonamide Derivatives Other (See Comments)    REACTION: hives    Current Outpatient Prescriptions on File Prior to Visit  Medication Sig Dispense Refill  . allopurinol  (ZYLOPRIM) 100 MG tablet Take 100 mg by mouth daily.      Marland Kitchen ALPRAZolam (XANAX) 1 MG tablet Take 1 mg by mouth as needed.       Marland Kitchen apixaban (ELIQUIS) 5 MG TABS tablet Take 1 tablet (5 mg total) by mouth 2 (two) times daily.  60 tablet  5  . baclofen (LIORESAL) 20 MG tablet Take 20 mg by mouth 2 (two) times daily. Take 10 mg morning, take 20 mg noon, 10 mg at 6 pm and 20 mg 11 pm.      . cilostazol (PLETAL) 50 MG tablet Take 50 mg by mouth 2 (two) times daily.       Marland Kitchen diltiazem (CARDIZEM CD) 120 MG 24 hr capsule Take 1 capsule (120 mg total) by mouth daily.  90 capsule  2  . folic acid (FOLVITE) 1 MG tablet Take 1 mg by mouth daily.      . Folic Acid-Vit P7-TGG Y69 (FOLBEE) 2.5-25-1 MG TABS tablet Take 1 tablet by mouth daily.  90 tablet  2  . furosemide (LASIX) 40 MG tablet Take 40 mg by mouth daily.      Marland Kitchen glipiZIDE (GLUCOTROL XL) 2.5 MG 24 hr tablet Take 2.5 mg by mouth daily.      Marland Kitchen HYDROcodone-acetaminophen (NORCO) 10-325 MG per tablet Take 1 tablet by mouth 2 (two) times daily.  For pain      . KLOR-CON M20 20 MEQ tablet Take 20 mEq by mouth daily.       . metoprolol (LOPRESSOR) 50 MG tablet Take 25 mg by mouth daily.       . mupirocin cream (BACTROBAN) 2 %       . pantoprazole (PROTONIX) 40 MG tablet Take 1 tablet (40 mg total) by mouth daily.  90 tablet  3  . ramipril (ALTACE) 2.5 MG capsule Take 1 capsule (2.5 mg total) by mouth daily.  90 capsule  2  . simvastatin (ZOCOR) 20 MG tablet Take 1 tablet (20 mg total) by mouth every evening.  30 tablet  6  . SPIRIVA HANDIHALER 18 MCG inhalation capsule Place 18 mcg into inhaler and inhale daily.       Marland Kitchen tiZANidine (ZANAFLEX) 4 MG tablet Take 4 mg by mouth 4 (four) times daily. 6 am- half tab 11 am- 1 tab 6 pm- half tab 11 pm- 2 tabs      . traZODone (DESYREL) 50 MG tablet Take 100 mg by mouth at bedtime.       Marland Kitchen zolpidem (AMBIEN CR) 12.5 MG CR tablet Take 12.5 mg by mouth daily.       No current facility-administered medications on file  prior to visit.   Married, Two children, One deceased from an MI age 75. One child in good health.     Objective:   Physical Exam  Filed Vitals:   01/10/14 1411  BP: 104/70  Pulse: 84  Temp: 97 F (36.1 C)  Height: 6' (1.829 m)  Weight: 230 lb 3.2 oz (104.418 kg)   Alert and oriented. Skin warm and dry. Oral mucosa is moist.   . Sclera anicteric, conjunctivae is pink. Thyroid not enlarged. No cervical lymphadenopathy. Lungs clear. Heart regular rate and rhythm.  Abdomen is soft. Bowel sounds are positive. No hepatomegaly. No abdominal masses felt. No tenderness.  No edema to lower extremities.     Assessment:     Constipation which has responded Amitiza, though he really cannot afford the medication.    Plan:    Continue the Amitiza 30mcg. Samples given. May also try Prune juice daily.    OV in 1 yr. If any problem, please call our office.

## 2014-01-10 NOTE — Patient Instructions (Signed)
Samples of Amitiza 107mcg given to patient.  OV in 1 yr.

## 2014-03-07 ENCOUNTER — Encounter (INDEPENDENT_AMBULATORY_CARE_PROVIDER_SITE_OTHER): Payer: Self-pay

## 2014-03-07 ENCOUNTER — Ambulatory Visit (INDEPENDENT_AMBULATORY_CARE_PROVIDER_SITE_OTHER): Payer: Medicare Other | Admitting: Nurse Practitioner

## 2014-03-07 ENCOUNTER — Encounter: Payer: Self-pay | Admitting: Nurse Practitioner

## 2014-03-07 VITALS — BP 106/58 | HR 89 | Ht 70.0 in | Wt 235.0 lb

## 2014-03-07 DIAGNOSIS — E119 Type 2 diabetes mellitus without complications: Secondary | ICD-10-CM

## 2014-03-07 DIAGNOSIS — M79662 Pain in left lower leg: Secondary | ICD-10-CM

## 2014-03-07 DIAGNOSIS — G2581 Restless legs syndrome: Secondary | ICD-10-CM | POA: Insufficient documentation

## 2014-03-07 DIAGNOSIS — M79609 Pain in unspecified limb: Secondary | ICD-10-CM

## 2014-03-07 DIAGNOSIS — G629 Polyneuropathy, unspecified: Secondary | ICD-10-CM

## 2014-03-07 DIAGNOSIS — G8220 Paraplegia, unspecified: Secondary | ICD-10-CM

## 2014-03-07 DIAGNOSIS — G609 Hereditary and idiopathic neuropathy, unspecified: Secondary | ICD-10-CM

## 2014-03-07 NOTE — Patient Instructions (Signed)
Will check iron and ferritin Continue baclofen and tizanidine at current doses Followup in 6 months, next visit with Dr. Leonie Man

## 2014-03-07 NOTE — Progress Notes (Signed)
I have read the note, and I agree with the clinical assessment and plan.  WILLIS,Uzziel KEITH   

## 2014-03-07 NOTE — Progress Notes (Signed)
[facility]  PATIENT: Chad Morse DOB: 12-Nov-1940   REASON FOR VISIT: follow up for paraplegia, pain in limbs   HISTORY OF PRESENT ILLNESS:  Chad Morse, 73 year old male returns for followup. He was last seen 09/06/2013.His intermittent leg pain and spasms are controlled on tizanidine and baclofen. He also feels being off of his statin drug has made a difference.  He is also complaining of restless legs and cramping, he has a history of anemia in the past. He has some mild edema of both lower extremities left worse than right. He has been afebrile. He also has a history of peripheral neuropathy and has failed Lyrica and Neurontin in the past. He claims his hemoglobin A1c has been between 6.5 and 7. He does not check his blood sugars. He has never had iron studies done he claims. He does not need refills on his meds.     HISTORY: chronic leg pain, and walking difficulties since 1998. He was initially diagnosed with peripheral neuropathy and saw Dr. Candis Morse neurologist in [place]. MRI scan of the lumbar spine showed mild disc degenrative changes. His gait difficulties and paresthesias got worse and he was seen at [facility] for second opinion in 2000 and treated with Elavil and Neurontin for neuropathy. Work up for etiology was negative. 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 He underwent surgery at [facility] for this. He has had intermittent episodes of spasms in his legs which was violent and at times was so severe that he would be up in the night and he in fact damaged his left hip and required hip replacement surgery. He saw Dr. Erling Cruz in our office in 2003 and was offered baclofen pump but the patient refused  that. He has been on baclofen and Zanaflex tablets since then.He was last seen by Cecille Rubin, NP on 12/08/2009. He has noticed improvement in his pain and spasms but it is still quite variable and can come once a week to up to once a month without prediction. He takes Tizanidine 8 milligrams twice daily and 2 mg twice daily at separate times as well has baclofen 20 mg twice daily and 10 mg twice daily at separate times. He is able to ambulate with a wheeled walker. In the last 2 weeks he has noticed increasing continuous pain in the leg particularly at night and he has not been able to sleep despite taking Ambien. He feels sleepy during the day and catches up on his sleep during daytime. He has been on simvastatin for a long time and this has been recently discontinued I. Dr. Luan Pulling thinking it may be causing myalgia.  He also has a history of gout, history of venous stasis versus venous insufficiency. He was admitted to the hospital in October 2010 for non healing diabetic ulcer. Left femoral artery stenting was done by Dr. Gwenlyn Morse.   REVIEW OF SYSTEMS: Full 14 system review of systems performed and notable only for those listed, all others are neg:  Constitutional: N/A  Cardiovascular: Leg swelling Ear/Nose/Throat: N/A  Skin: N/A  Eyes: N/A  Respiratory: N/A  Gastroitestinal: Constipation  Hematology/Lymphatic: N/A  Endocrine: N/A Musculoskeletal: Joint pain, muscle cramps Allergy/Immunology: N/A  Neurological: N/A Psychiatric: N/A Sleep :  Restless legs, daytime sleepiness   ALLERGIES: Allergies  Allergen Reactions  . Ciprofloxacin Hives  . Penicillins Other (See Comments)    REACTION: hives  . Sulfonamide Derivatives Other (See Comments)    REACTION: hives    HOME MEDICATIONS: Outpatient Prescriptions Prior to Visit  Medication Sig Dispense Refill  . allopurinol (ZYLOPRIM) 100 MG tablet Take 100 mg by mouth daily.      Marland Kitchen ALPRAZolam (XANAX) 1 MG tablet Take 1 mg by mouth as  needed.       Marland Kitchen apixaban (ELIQUIS) 5 MG TABS tablet Take 1 tablet (5 mg total) by mouth 2 (two) times daily.  60 tablet  5  . baclofen (LIORESAL) 20 MG tablet Take 20 mg by mouth 2 (two) times daily. Take 10 mg morning, take 20 mg noon, 10 mg at 6 pm and 20 mg 11 pm.      . cilostazol (PLETAL) 50 MG tablet Take 50 mg by mouth 2 (two) times daily.       Marland Kitchen diltiazem (CARDIZEM CD) 120 MG 24 hr capsule Take 1 capsule (120 mg total) by mouth daily.  90 capsule  2  . folic acid (FOLVITE) 1 MG tablet Take 1 mg by mouth daily.      . Folic Acid-Vit Y3-KZS W10 (FOLBEE) 2.5-25-1 MG TABS tablet Take 1 tablet by mouth daily.  90 tablet  2  . furosemide (LASIX) 40 MG tablet Take 40 mg by mouth daily.      Marland Kitchen glipiZIDE (GLUCOTROL XL) 2.5 MG 24 hr tablet Take 2.5 mg by mouth daily.      Marland Kitchen HYDROcodone-acetaminophen (NORCO) 10-325 MG per tablet Take 1 tablet by mouth 2 (two) times daily. For pain      . KLOR-CON M20 20 MEQ tablet Take 20 mEq by mouth daily.       . metoprolol (LOPRESSOR) 50 MG tablet Take 25 mg by mouth daily.       . mupirocin cream (BACTROBAN) 2 % Apply 1 application topically as needed.       . pantoprazole (PROTONIX) 40 MG tablet Take 1 tablet (40 mg total) by mouth daily.  90 tablet  3  . ramipril (ALTACE) 2.5 MG capsule Take 1 capsule (2.5 mg total) by mouth daily.  90 capsule  2  . simvastatin (ZOCOR) 20 MG tablet Take 1 tablet (20 mg total) by mouth every evening.  30 tablet  6  . SPIRIVA HANDIHALER 18 MCG inhalation capsule Place 18 mcg into inhaler and inhale daily.       Marland Kitchen tiZANidine (ZANAFLEX) 4 MG tablet Take 4 mg by mouth 4 (four) times daily. 6 am- half tab 11 am- 1 tab 6 pm- half tab 11 pm- 2 tabs      . traZODone (DESYREL) 50 MG tablet Take 100 mg by mouth at bedtime.       Marland Kitchen zolpidem (AMBIEN CR) 12.5 MG CR tablet Take 12.5 mg by mouth daily.       No facility-administered medications prior to visit.    PAST MEDICAL HISTORY: Past Medical History  Diagnosis Date  . COPD  (chronic obstructive pulmonary disease)   . Diabetes mellitus   . Atrial fibrillation   . Peripheral neuropathy   . Gallstones   . Peripheral vascular disease   . IBS (irritable bowel syndrome)   . Peripheral arterial disease   . Tobacco abuse   . Hypertension   . Hyperlipidemia     PAST SURGICAL HISTORY: Past Surgical History  Procedure Laterality Date  . Back surgery      FAMILY HISTORY: History reviewed. No pertinent family history.  SOCIAL HISTORY: History   Social History  . Marital Status: Married    Spouse Name: martha    Number of Children: 1  . Years of Education: 12   Occupational History  . retired    Social History Main Topics  . Smoking status: Current Every Day Smoker -- 60 years    Types: Cigarettes  . Smokeless tobacco: Never Used     Comment: 5 cigarttes a day  . Alcohol Use: No  . Drug Use: No  . Sexual Activity: No     Comment: states has been down to 3 cigarettes/day, but "i'm quitting"   Other Topics Concern  . Not on file   Social History Narrative   Patient is retired.    Patient lives at home with wife Jana Half.    Patient has one child.    Patient is right handed.    Patient has a high school education.      PHYSICAL EXAM  Filed Vitals:   03/07/14 1336  BP: 106/58  Pulse: 89  Height: 5\' 10"  (1.778 m)  Weight: 235 lb (106.595 kg)   Body mass index is 33.72 kg/(m^2).  Generalized: Well developed, obese male in no acute distress  Head: normocephalic and atraumatic,. Oropharynx benign  Neck: Supple, no carotid bruits  Skin both lower extremities reddened in color from the knee down , pedal pulses present, no skin breakdown Musculoskeletal: No deformity  Neurological examination  Mentation: Alert oriented to time, place, history taking. Follows all commands speech and language fluent  Cranial nerve II-XII: Pupils were equal round reactive to light extraocular movements were full, visual field were full on confrontational  test. Facial sensation and strength were normal. hearing was intact to finger rubbing bilaterally. Uvula tongue midline. head turning and shoulder shrug were normal and symmetric.Tongue protrusion into cheek strength was normal.  Motor: normal bulk and tone, full strength in the BUE, increased tone in both lower extremities left more than right with mild hip flexor weakness left greater than right. 3/5 strength at both ankles left greater than right.  Sensory: Diminished light touch, pinprick, left more than right, absent vibration at the toes and ankles and knees Coordination: finger-nose-finger, no dysmetria  Reflexes: 1+ in the upper extremities and absent in the lower extremities  Gait and Station: Rising up from seated position without assistance, wide based stance, spastic ataxic gait, unable to tandem, uses a rolling walker  DIAGNOSTIC DATA (LABS, IMAGING, TESTING) - I reviewed patient records, labs, notes, testing and imaging myself where available.  Lab Results  Component Value Date   WBC 9.7 03/16/2013   HGB 13.4 03/16/2013   HCT 42.8 03/16/2013   MCV 72.3* 03/16/2013   PLT 218 03/16/2013      Component Value Date/Time   NA 142 03/16/2013 1139   K 4.0 03/16/2013 1139   CL 96 03/16/2013 1139   CO2 35* 03/16/2013 1139   GLUCOSE 105* 03/16/2013 1139   BUN 18 03/16/2013 1139   CREATININE 1.01 03/16/2013 1139   CREATININE 0.77 11/12/2012 0520   CALCIUM 9.1 03/16/2013 1139   PROT 6.5 03/16/2013 1139   ALBUMIN 3.7 03/16/2013 1139   AST 22 03/16/2013 1139   ALT 14 03/16/2013 1139   ALKPHOS 98 03/16/2013 1139   BILITOT 0.5 03/16/2013 1139   GFRNONAA 88* 11/12/2012 0520   GFRAA >90 11/12/2012 0520  Lab Results  Component Value Date   CHOL 89 03/16/2013   HDL 30* 03/16/2013   LDLCALC 40 03/16/2013   TRIG 97 03/16/2013   CHOLHDL 3.0 03/16/2013   Lab Results  Component Value Date   HGBA1C 6.9* 11/09/2012    ASSESSMENT AND PLAN  73 y.o. year old male  has a past medical history of chronic lower extremity pain and  spasms secondary to spinal cord intramedullary tumor T5-T8. Spastic paraplegia and intermittent leg pain , restless legs. COPD (chronic obstructive pulmonary disease); Diabetes mellitus; Atrial fibrillation; Peripheral neuropathy;  Peripheral vascular disease;  Peripheral arterial disease; Tobacco abuse; Hypertension; and Hyperlipidemia. here to followup.Patient is seen by Dr. Leonie Man who is out of the office.   Will check iron studies and ferritin Continue baclofen and tizanidine at current doses Followup in 6 months, next visit with Dr. Sloan Leiter, [facility], [facility], [facility] [address], [place], [place] [address] [phone]

## 2014-03-08 LAB — IRON AND TIBC
IRON SATURATION: 7 % — AB (ref 15–55)
Iron: 29 ug/dL — ABNORMAL LOW (ref 40–155)
TIBC: 444 ug/dL (ref 250–450)
UIBC: 415 ug/dL — AB (ref 150–375)

## 2014-03-08 LAB — FERRITIN: Ferritin: 20 ng/mL — ABNORMAL LOW (ref 30–400)

## 2014-03-11 ENCOUNTER — Ambulatory Visit (INDEPENDENT_AMBULATORY_CARE_PROVIDER_SITE_OTHER): Payer: Medicare Other | Admitting: Cardiology

## 2014-03-11 ENCOUNTER — Encounter: Payer: Self-pay | Admitting: Cardiology

## 2014-03-11 VITALS — BP 100/60 | HR 77 | Ht 70.0 in | Wt 235.0 lb

## 2014-03-11 DIAGNOSIS — I2510 Atherosclerotic heart disease of native coronary artery without angina pectoris: Secondary | ICD-10-CM

## 2014-03-11 DIAGNOSIS — I4821 Permanent atrial fibrillation: Secondary | ICD-10-CM

## 2014-03-11 DIAGNOSIS — I4891 Unspecified atrial fibrillation: Secondary | ICD-10-CM

## 2014-03-11 DIAGNOSIS — Z7901 Long term (current) use of anticoagulants: Secondary | ICD-10-CM

## 2014-03-11 DIAGNOSIS — R609 Edema, unspecified: Secondary | ICD-10-CM

## 2014-03-11 DIAGNOSIS — R600 Localized edema: Secondary | ICD-10-CM

## 2014-03-11 LAB — BASIC METABOLIC PANEL WITH GFR
BUN: 21 mg/dL (ref 6–23)
CALCIUM: 9.1 mg/dL (ref 8.4–10.5)
CO2: 37 mEq/L — ABNORMAL HIGH (ref 19–32)
Chloride: 95 mEq/L — ABNORMAL LOW (ref 96–112)
Creat: 1.38 mg/dL — ABNORMAL HIGH (ref 0.50–1.35)
GFR, EST AFRICAN AMERICAN: 57 mL/min — AB
GFR, EST NON AFRICAN AMERICAN: 50 mL/min — AB
Glucose, Bld: 82 mg/dL (ref 70–99)
Potassium: 4.6 mEq/L (ref 3.5–5.3)
SODIUM: 142 meq/L (ref 135–145)

## 2014-03-11 MED ORDER — POTASSIUM CHLORIDE ER 20 MEQ PO TBCR: 20.0000 meq | EXTENDED_RELEASE_TABLET | Freq: Every day | ORAL | Status: DC

## 2014-03-11 NOTE — Patient Instructions (Signed)
take 2 tabs of 40 mg of Lasix (furosemide) daily  For 1 week then back to one daily  Weigh daily Call [phone] if weight climbs more than 3 pounds in a day or 5 pounds in a week. No salt to very little salt in your diet.  No more than 2000 mg in a day. Call if increased shortness of breath or increased swelling.   I added potassium 20 meq daily with the lasix while you are on 80-for 5 days only  Follow up with Dr. Gwenlyn Found in 6 months.  Though if swelling not improving see me back or Dr. Luan Pulling.

## 2014-03-11 NOTE — Progress Notes (Signed)
03/13/2014   PCP: Alonza Bogus, MD   Chief Complaint  Patient presents with  . Follow-up    6 month visit       chronic SOB       Primary Cardiologist: Dr. Adora Fridge   HPI:  73 year old moderately overweight married Caucasian male formally patient of Dr. Rollene Fare.   He has a history of noncritical CAD by cath remotely. And atrial fib. Also history of PAD status post left SFA stenting by Dr. Adora Fridge in 2010. Has long history of tobacco abuse and has COPD as well as hypertension and hyperlipidemia. Peripheral neuropathy. He denies chest pain or shortness of breath process Myoview performed in 2013 is nonischemic. He does have chronic A. Fib on Eliquis oral anticoagulation.  Last echo was May of 2014 EF 55-60%.  Pulmonary artery PA pressure was 60 mmHg consistent with moderate pulmonary-hypertension.  He is here today for six-month followup he complains of lower extremity edema and one off for about one week.  No chest pain and no change in his chronic shortness of breath.     Allergies  Allergen Reactions  . Ciprofloxacin Hives  . Penicillins Other (See Comments)    REACTION: hives  . Sulfonamide Derivatives Other (See Comments)    REACTION: hives    Current Outpatient Prescriptions  Medication Sig Dispense Refill  . allopurinol (ZYLOPRIM) 100 MG tablet Take 100 mg by mouth daily.      Marland Kitchen ALPRAZolam (XANAX) 1 MG tablet Take 1 mg by mouth as needed.       Marland Kitchen apixaban (ELIQUIS) 5 MG TABS tablet Take 1 tablet (5 mg total) by mouth 2 (two) times daily.  60 tablet  5  . baclofen (LIORESAL) 20 MG tablet Take 20 mg by mouth 2 (two) times daily. Take 10 mg morning, take 20 mg noon, 10 mg at 6 pm and 20 mg 11 pm.      . cilostazol (PLETAL) 50 MG tablet Take 50 mg by mouth 2 (two) times daily.       Marland Kitchen diltiazem (CARDIZEM CD) 120 MG 24 hr capsule Take 1 capsule (120 mg total) by mouth daily.  90 capsule  2  . folic acid (FOLVITE) 1 MG tablet Take 1 mg by mouth daily.       . Folic Acid-Vit I7-TIW P80 (FOLBEE) 2.5-25-1 MG TABS tablet Take 1 tablet by mouth daily.  90 tablet  2  . furosemide (LASIX) 40 MG tablet Take 40 mg by mouth daily.      Marland Kitchen glipiZIDE (GLUCOTROL XL) 2.5 MG 24 hr tablet Take 2.5 mg by mouth daily.      Marland Kitchen HYDROcodone-acetaminophen (NORCO) 10-325 MG per tablet Take 1 tablet by mouth 2 (two) times daily. For pain      . KLOR-CON M20 20 MEQ tablet Take 20 mEq by mouth daily.       . metoprolol (LOPRESSOR) 50 MG tablet Take 25 mg by mouth daily.       . mupirocin cream (BACTROBAN) 2 % Apply 1 application topically as needed.       . pantoprazole (PROTONIX) 40 MG tablet Take 1 tablet (40 mg total) by mouth daily.  90 tablet  3  . ramipril (ALTACE) 2.5 MG capsule Take 1 capsule (2.5 mg total) by mouth daily.  90 capsule  2  . simvastatin (ZOCOR) 20 MG tablet Take 1 tablet (20 mg total) by mouth every evening.  30 tablet  6  . SPIRIVA HANDIHALER 18 MCG inhalation capsule Place 18 mcg into inhaler and inhale daily.       Marland Kitchen tiZANidine (ZANAFLEX) 4 MG tablet Take 4 mg by mouth 4 (four) times daily. 6 am- half tab 11 am- 1 tab 6 pm- half tab 11 pm- 2 tabs      . traZODone (DESYREL) 50 MG tablet Take 100 mg by mouth at bedtime.       Marland Kitchen zolpidem (AMBIEN CR) 12.5 MG CR tablet Take 12.5 mg by mouth daily.      . potassium chloride 20 MEQ TBCR Take 20 mEq by mouth daily.  30 tablet  1   No current facility-administered medications for this visit.    Past Medical History  Diagnosis Date  . COPD (chronic obstructive pulmonary disease)   . Diabetes mellitus   . Atrial fibrillation   . Peripheral neuropathy   . Gallstones   . Peripheral vascular disease   . IBS (irritable bowel syndrome)   . Peripheral arterial disease   . Tobacco abuse   . Hypertension   . Hyperlipidemia     Past Surgical History  Procedure Laterality Date  . Back surgery      TKW:IOXBDZH:GD colds or fevers, 5 lb weight gain Skin:no rashes or ulcers HEENT:no blurred  vision, no congestion CV:see HPI PUL:see HPI GI:no diarrhea constipation or melena, no indigestion GU:no hematuria, no dysuria MS:no joint pain, no claudication, + lower ext edema Neuro:no syncope, no lightheadedness Endo:+ diabetes, no thyroid disease  Wt Readings from Last 3 Encounters:  03/11/14 235 lb (106.595 kg)  03/07/14 235 lb (106.595 kg)  01/10/14 230 lb 3.2 oz (104.418 kg)    PHYSICAL EXAM BP 100/60  Pulse 77  Ht 5\' 10"  (1.778 m)  Wt 235 lb (106.595 kg)  BMI 33.72 kg/m2 General:Pleasant affect, NAD Skin:Warm and dry, brisk capillary refill HEENT:normocephalic, sclera clear, mucus membranes moist Neck:supple, no JVD, no bruits  Heart:S1S2 irreg irreg without murmur, gallup, rub or click Lungs:clear without rales, rhonchi, or wheezes JME:QAST, non tender, + BS, do not palpate liver spleen or masses Ext:++ lower ext edema, 2+ pedal pulses, 2+ radial pulses Neuro:alert and oriented, MAE, follows commands, + facial symmetry  MHD:QQIWLN fib.  RBBB Lt post fascicular rate controlled  No acute changes  ASSESSMENT AND PLAN CAD Stable, no chest pain negative Nuc study 2013.  Permanent atrial fibrillation Rate controlled  Chronic anticoagulation On eliquis without melena nor hematuria  Bilateral leg edema  May have been eating more salt than usual. He is take 80 mg of Lasix daily for one week and returned one daily. He'll and potassium 20 mEq daily with the Lasix first 5 days only he will monitor his diet more closely he'll follow a diet. 6 months. He will either see me back or Dr. Luan Pulling if his edema doesn't resolve quickly. We will check a basic metabolic panel.

## 2014-03-13 DIAGNOSIS — Z7901 Long term (current) use of anticoagulants: Secondary | ICD-10-CM | POA: Insufficient documentation

## 2014-03-13 DIAGNOSIS — R600 Localized edema: Secondary | ICD-10-CM | POA: Insufficient documentation

## 2014-03-13 NOTE — Assessment & Plan Note (Signed)
  May have been eating more salt than usual. He is take 80 mg of Lasix daily for one week and returned one daily. He'll and potassium 20 mEq daily with the Lasix first 5 days only he will monitor his diet more closely he'll follow a diet. 6 months. He will either see me back or Dr. Luan Pulling if his edema doesn't resolve quickly. We will check a basic metabolic panel.

## 2014-03-13 NOTE — Assessment & Plan Note (Signed)
On eliquis without melena nor hematuria

## 2014-03-13 NOTE — Assessment & Plan Note (Signed)
Rate controlled 

## 2014-03-13 NOTE — Assessment & Plan Note (Signed)
Stable, no chest pain negative Nuc study 2013.

## 2014-03-28 ENCOUNTER — Other Ambulatory Visit: Payer: Self-pay | Admitting: *Deleted

## 2014-03-28 MED ORDER — APIXABAN 5 MG PO TABS: 5.0000 mg | ORAL_TABLET | Freq: Two times a day (BID) | ORAL | Status: AC

## 2014-03-28 NOTE — Telephone Encounter (Signed)
Rx was sent to pharmacy electronically. 

## 2014-05-24 ENCOUNTER — Emergency Department (HOSPITAL_COMMUNITY): Payer: Medicare Other

## 2014-05-24 ENCOUNTER — Encounter (HOSPITAL_COMMUNITY): Payer: Self-pay | Admitting: Emergency Medicine

## 2014-05-24 ENCOUNTER — Emergency Department (HOSPITAL_COMMUNITY)
Admission: EM | Admit: 2014-05-24 | Discharge: 2014-05-24 | Disposition: A | Discharge status: Home / Self Care | Payer: Medicare Other | Attending: Emergency Medicine | Admitting: Emergency Medicine

## 2014-05-24 DIAGNOSIS — I10 Essential (primary) hypertension: Secondary | ICD-10-CM | POA: Diagnosis not present

## 2014-05-24 DIAGNOSIS — F17200 Nicotine dependence, unspecified, uncomplicated: Secondary | ICD-10-CM | POA: Diagnosis not present

## 2014-05-24 DIAGNOSIS — Z8669 Personal history of other diseases of the nervous system and sense organs: Secondary | ICD-10-CM | POA: Diagnosis not present

## 2014-05-24 DIAGNOSIS — Z880 Allergy status to penicillin: Secondary | ICD-10-CM | POA: Insufficient documentation

## 2014-05-24 DIAGNOSIS — Z8719 Personal history of other diseases of the digestive system: Secondary | ICD-10-CM | POA: Insufficient documentation

## 2014-05-24 DIAGNOSIS — I4891 Unspecified atrial fibrillation: Secondary | ICD-10-CM | POA: Insufficient documentation

## 2014-05-24 DIAGNOSIS — E785 Hyperlipidemia, unspecified: Secondary | ICD-10-CM | POA: Diagnosis not present

## 2014-05-24 DIAGNOSIS — J449 Chronic obstructive pulmonary disease, unspecified: Secondary | ICD-10-CM | POA: Diagnosis not present

## 2014-05-24 DIAGNOSIS — E860 Dehydration: Secondary | ICD-10-CM | POA: Diagnosis not present

## 2014-05-24 DIAGNOSIS — J4489 Other specified chronic obstructive pulmonary disease: Secondary | ICD-10-CM | POA: Insufficient documentation

## 2014-05-24 DIAGNOSIS — Z79899 Other long term (current) drug therapy: Secondary | ICD-10-CM | POA: Diagnosis not present

## 2014-05-24 DIAGNOSIS — R112 Nausea with vomiting, unspecified: Secondary | ICD-10-CM | POA: Diagnosis present

## 2014-05-24 DIAGNOSIS — R42 Dizziness and giddiness: Secondary | ICD-10-CM | POA: Insufficient documentation

## 2014-05-24 DIAGNOSIS — E119 Type 2 diabetes mellitus without complications: Secondary | ICD-10-CM | POA: Diagnosis not present

## 2014-05-24 LAB — CBC WITH DIFFERENTIAL/PLATELET
Basophils Absolute: 0 10*3/uL (ref 0.0–0.1)
Basophils Relative: 0 % (ref 0–1)
Eosinophils Absolute: 0.1 10*3/uL (ref 0.0–0.7)
Eosinophils Relative: 1 % (ref 0–5)
HCT: 56.4 % — ABNORMAL HIGH (ref 39.0–52.0)
HEMOGLOBIN: 19.3 g/dL — AB (ref 13.0–17.0)
LYMPHS ABS: 2.2 10*3/uL (ref 0.7–4.0)
LYMPHS PCT: 15 % (ref 12–46)
MCH: 29.3 pg (ref 26.0–34.0)
MCHC: 34.2 g/dL (ref 30.0–36.0)
MCV: 85.6 fL (ref 78.0–100.0)
MONOS PCT: 10 % (ref 3–12)
Monocytes Absolute: 1.5 10*3/uL — ABNORMAL HIGH (ref 0.1–1.0)
NEUTROS ABS: 10.8 10*3/uL — AB (ref 1.7–7.7)
NEUTROS PCT: 74 % (ref 43–77)
PLATELETS: 145 10*3/uL — AB (ref 150–400)
RBC: 6.59 MIL/uL — AB (ref 4.22–5.81)
RDW: 16.7 % — ABNORMAL HIGH (ref 11.5–15.5)
WBC: 14.6 10*3/uL — AB (ref 4.0–10.5)

## 2014-05-24 LAB — URINE MICROSCOPIC-ADD ON

## 2014-05-24 LAB — URINALYSIS, ROUTINE W REFLEX MICROSCOPIC
BILIRUBIN URINE: NEGATIVE
GLUCOSE, UA: NEGATIVE mg/dL
KETONES UR: NEGATIVE mg/dL
Nitrite: NEGATIVE
PH: 5 (ref 5.0–8.0)
PROTEIN: 30 mg/dL — AB
Specific Gravity, Urine: 1.01 (ref 1.005–1.030)
Urobilinogen, UA: 0.2 mg/dL (ref 0.0–1.0)

## 2014-05-24 LAB — BASIC METABOLIC PANEL
Anion gap: 19 — ABNORMAL HIGH (ref 5–15)
BUN: 34 mg/dL — AB (ref 6–23)
CHLORIDE: 89 meq/L — AB (ref 96–112)
CO2: 32 meq/L (ref 19–32)
Calcium: 6.5 mg/dL — ABNORMAL LOW (ref 8.4–10.5)
Creatinine, Ser: 1.67 mg/dL — ABNORMAL HIGH (ref 0.50–1.35)
GFR calc Af Amer: 45 mL/min — ABNORMAL LOW (ref >90)
GFR calc non Af Amer: 38 mL/min — ABNORMAL LOW (ref >90)
GLUCOSE: 91 mg/dL (ref 70–99)
POTASSIUM: 3.6 meq/L — AB (ref 3.7–5.3)
SODIUM: 140 meq/L (ref 137–147)

## 2014-05-24 LAB — LIPASE, BLOOD: Lipase: 15 U/L (ref 11–59)

## 2014-05-24 LAB — TROPONIN I: Troponin I: <0.3 ng/mL (ref <0.30)

## 2014-05-24 LAB — CBG MONITORING, ED: Glucose-Capillary: 101 mg/dL — ABNORMAL HIGH (ref 70–99)

## 2014-05-24 MED ORDER — CEFUROXIME AXETIL 250 MG PO TABS: 250.0000 mg | ORAL_TABLET | Freq: Two times a day (BID) | ORAL | Status: AC

## 2014-05-24 MED ORDER — ONDANSETRON HCL 4 MG PO TABS: 4.0000 mg | ORAL_TABLET | Freq: Four times a day (QID) | ORAL | Status: AC

## 2014-05-24 MED ORDER — ONDANSETRON HCL 4 MG/2ML IJ SOLN
4.0000 mg | Freq: Once | INTRAMUSCULAR | Status: AC
Start: 2014-05-24 — End: 2014-05-24
  Administered 2014-05-24: 4 mg via INTRAVENOUS
  Filled 2014-05-24: qty 2

## 2014-05-24 MED ORDER — SODIUM CHLORIDE 0.9 % IV SOLN: 1000.0000 mL | INTRAVENOUS | Status: DC

## 2014-05-24 MED ORDER — SODIUM CHLORIDE 0.9 % IV SOLN
1000.0000 mL | INTRAVENOUS | Status: DC
  Administered 2014-05-24: 1000 mL via INTRAVENOUS

## 2014-05-24 MED ORDER — SODIUM CHLORIDE 0.9 % IV SOLN
1000.0000 mL | Freq: Once | INTRAVENOUS | Status: AC
  Administered 2014-05-24: 1000 mL via INTRAVENOUS

## 2014-05-24 NOTE — ED Notes (Signed)
sats 84% on RA.  Placed pt on 2L O2, sats increased to 88%.  Increased O2 to 3L/min, sats 90%.  Pt denies sob.

## 2014-05-24 NOTE — ED Notes (Signed)
Pt started vomiting yesterday. Pt alert/oriented. Vomited x 5. No vomiting today. Kept chx noodle soup down last night. Today wife states pt felt like he could not see and was going to pass out. Pt c/o headache and blurred vision at this time.

## 2014-05-24 NOTE — ED Provider Notes (Addendum)
CSN: [id number]     Arrival date & time 05/24/14  1435 History   First MD Initiated Contact with Patient 05/24/14 1502     Chief Complaint  Patient presents with  . Vomiting    HPI Patient presents to emergency room with complaints of nausea and vomiting. Symptoms started yesterday. He has had about 4 or 5 episodes. Yesterday  initially it was yellow and then the emesis turned brown in color. He was able to keep a little bit of food down last evening but the symptoms returned again today. Today he started feeling lightheaded as if he was going to pass out. The symptoms worsened whenever he tried to stand. He denies any chest pain or abdominal pain. He has not had any diarrhea. He has been passing gas. He denies any abdominal distention. Past Medical History  Diagnosis Date  . COPD (chronic obstructive pulmonary disease)   . Diabetes mellitus   . Atrial fibrillation   . Peripheral neuropathy   . Gallstones   . Peripheral vascular disease   . IBS (irritable bowel syndrome)   . Peripheral arterial disease   . Tobacco abuse   . Hypertension   . Hyperlipidemia    Past Surgical History  Procedure Laterality Date  . Back surgery     History reviewed. No pertinent family history. History  Substance Use Topics  . Smoking status: Current Every Day Smoker -- 60 years    Types: Cigarettes  . Smokeless tobacco: Never Used     Comment: 5 cigarttes a day  . Alcohol Use: No    Review of Systems  All other systems reviewed and are negative.     Allergies  Ciprofloxacin; Penicillins; and Sulfonamide derivatives  Home Medications   Prior to Admission medications   Medication Sig Start Date End Date Taking? Authorizing Provider  allopurinol (ZYLOPRIM) 100 MG tablet Take 100 mg by mouth daily.   Yes Historical Provider, MD  ALPRAZolam Duanne Moron) 1 MG tablet Take 1 mg by mouth daily as needed for anxiety or sleep.  12/25/12  Yes Historical Provider, MD  apixaban (ELIQUIS) 5 MG TABS tablet  Take 1 tablet (5 mg total) by mouth 2 (two) times daily. 03/28/14  Yes Lorretta Harp, MD  baclofen (LIORESAL) 20 MG tablet Take 20 mg by mouth 2 (two) times daily. Take 10 mg morning, take 20 mg noon, 10 mg at 6 pm and 20 mg 11 pm.   Yes Historical Provider, MD  cilostazol (PLETAL) 50 MG tablet Take 50 mg by mouth 2 (two) times daily.  01/04/13  Yes Historical Provider, MD  diltiazem (CARDIZEM CD) 120 MG 24 hr capsule Take 1 capsule (120 mg total) by mouth daily. 12/07/13  Yes Lorretta Harp, MD  folic acid (FOLVITE) 1 MG tablet Take 1 mg by mouth daily.   Yes Historical Provider, MD  Folic Acid-Vit T5-VVO H60 (FOLBEE) 2.5-25-1 MG TABS tablet Take 1 tablet by mouth daily. 11/16/13  Yes Lorretta Harp, MD  furosemide (LASIX) 40 MG tablet Take 40 mg by mouth daily.   Yes Historical Provider, MD  HYDROcodone-acetaminophen (NORCO) 10-325 MG per tablet Take 1 tablet by mouth 3 (three) times daily. For pain   Yes Historical Provider, MD  KLOR-CON M20 20 MEQ tablet Take 20 mEq by mouth daily.  12/10/12  Yes Historical Provider, MD  metFORMIN (GLUCOPHAGE) 500 MG tablet Take 500 mg by mouth 2 (two) times daily.  04/25/14  Yes Historical Provider, MD  metoprolol (LOPRESSOR) 50  MG tablet Take 25 mg by mouth every evening.    Yes Historical Provider, MD  mupirocin cream (BACTROBAN) 2 % Apply 1 application topically as needed (for skin irritation).  09/01/13  Yes Historical Provider, MD  pantoprazole (PROTONIX) 40 MG tablet Take 1 tablet (40 mg total) by mouth daily. 10/13/13  Yes Lorretta Harp, MD  ramipril (ALTACE) 2.5 MG capsule Take 1 capsule (2.5 mg total) by mouth daily. 10/18/13  Yes Lorretta Harp, MD  simvastatin (ZOCOR) 20 MG tablet Take 1 tablet (20 mg total) by mouth every evening. 11/24/13  Yes Lorretta Harp, MD  SPIRIVA HANDIHALER 18 MCG inhalation capsule Place 18 mcg into inhaler and inhale daily as needed (for shortness of breath).    Yes Historical Provider, MD  tiZANidine (ZANAFLEX) 4 MG  tablet Take 2-4 mg by mouth 3 (three) times daily. 6 am- half tab 11 am- 1 tab 6 pm- half tab 11 pm- 2 tabs 07/06/13  Yes Antony Contras, MD  traZODone (DESYREL) 50 MG tablet Take 100 mg by mouth at bedtime.  06/29/13  Yes Historical Provider, MD  zolpidem (AMBIEN CR) 12.5 MG CR tablet Take 12.5 mg by mouth daily. 06/29/13  Yes Historical Provider, MD  ondansetron (ZOFRAN) 4 MG tablet Take 1 tablet (4 mg total) by mouth every 6 (six) hours. 05/24/14   Dorie Rank, MD   BP 127/88  Pulse 95  Temp(Src) 98 F (36.7 C) (Oral)  Resp 21  Ht 5\' 11"  (1.803 m)  Wt 205 lb (92.987 kg)  BMI 28.60 kg/m2  SpO2 92% Physical Exam  Nursing note and vitals reviewed. Constitutional: No distress.  HENT:  Head: Normocephalic and atraumatic.  Right Ear: External ear normal.  Left Ear: External ear normal.  Mouth/Throat: No oropharyngeal exudate.  Mucous membranes dry  Eyes: Conjunctivae are normal. Right eye exhibits no discharge. Left eye exhibits no discharge. No scleral icterus.  Neck: Neck supple. No tracheal deviation present.  Cardiovascular: Normal rate, regular rhythm and intact distal pulses.   Pulmonary/Chest: Effort normal and breath sounds normal. No stridor. No respiratory distress. He has no wheezes. He has no rales.  Abdominal: Soft. Bowel sounds are normal. He exhibits no distension. There is no tenderness. There is no rebound and no guarding.  Musculoskeletal: He exhibits no edema and no tenderness.  Neurological: He is alert. He has normal strength. No cranial nerve deficit (no facial droop, extraocular movements intact, no slurred speech) or sensory deficit. He exhibits normal muscle tone. He displays no seizure activity. Coordination normal.  Skin: Skin is warm and dry. No rash noted. He is not diaphoretic.  Psychiatric: He has a normal mood and affect.    ED Course  Procedures (including critical care time) Labs Review Labs Reviewed  CBC WITH DIFFERENTIAL - Abnormal; Notable for the  following:    WBC 14.6 (*)    RBC 6.59 (*)    Hemoglobin 19.3 (*)    HCT 56.4 (*)    RDW 16.7 (*)    Platelets 145 (*)    Neutro Abs 10.8 (*)    Monocytes Absolute 1.5 (*)    All other components within normal limits  BASIC METABOLIC PANEL - Abnormal; Notable for the following:    Potassium 3.6 (*)    Chloride 89 (*)    BUN 34 (*)    Creatinine, Ser 1.67 (*)    Calcium 6.5 (*)    GFR calc non Af Amer 38 (*)    GFR calc  Af Amer 45 (*)    Anion gap 19 (*)    All other components within normal limits  CBG MONITORING, ED - Abnormal; Notable for the following:    Glucose-Capillary 101 (*)    All other components within normal limits  TROPONIN I  LIPASE, BLOOD  URINALYSIS, ROUTINE W REFLEX MICROSCOPIC    Imaging Review Ct Head Wo Contrast  05/24/2014   CLINICAL DATA:  Vomiting.  Dizziness.  Blurred vision.  EXAM: CT HEAD WITHOUT CONTRAST  TECHNIQUE: Contiguous axial images were obtained from the base of the skull through the vertex without intravenous contrast.  COMPARISON:  None  FINDINGS: Ventricles are normal in configuration. There is ventricular and sulcal enlargement reflecting mild atrophy. No hydrocephalus.  No parenchymal masses or mass effect. There is a well-defined fluid intensity focus in the inferior left cerebellum which may reflect an old focal infarct. There are mild patchy areas of white matter hypoattenuation consistent with chronic microvascular ischemic change. There is no evidence of a recent infarct.  There are no extra-axial masses or abnormal fluid collections.  No intracranial hemorrhage.  Visualized sinuses and mastoid air cells are clear.  IMPRESSION: 1. No acute intracranial abnormalities. 2. Mild atrophy and chronic microvascular ischemic change. 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   On: 05/24/2014 16:24   Dg Abd Acute W/chest  05/24/2014   CLINICAL DATA:  Blurred vision and vomiting.  COPD.  Diabetes.   EXAM: ACUTE ABDOMEN SERIES (ABDOMEN 2 VIEW & CHEST 1 VIEW)  COMPARISON:  11/09/2012  FINDINGS: Frontal view of the chest demonstrates patient rotated minimally left. Mild cardiomegaly. No pleural effusion or pneumothorax. Diffuse peribronchial thickening. Clear lungs.  Abdominal films demonstrate limitation secondary to patient body habitus and underpenetration. Possible air-fluid levels within the colon. No gaseous distention of bowel loops on supine imaging. Distal gas. Left hip arthroplasty.  IMPRESSION: Degraded upright view secondary to patient body habitus and underpenetration. Nonspecific bowel gas pattern with suggestion of colonic air-fluid levels, but no bowel distension.  Cardiomegaly and interstitial thickening, likely related to chronic bronchitis/smoking.   Electronically Signed   By: Abigail Miyamoto M.D.   On: 05/24/2014 15:56     EKG Interpretation   Date/Time:  Tuesday May 24 2014 14:43:36 EDT Ventricular Rate:  97 PR Interval:    QRS Duration: 140 QT Interval:  410 QTC Calculation: 520 R Axis:   154 Text Interpretation:  Probable atrial flutter or  Atrial fibrillation with  premature ventricular or aberrantly conducted complexes Right bundle  branch block Abnormal ECG irregular rhythm is new since last tracing  Confirmed by Travarus Trudo  MD-J, Ravis Herne ([id number]) on 05/24/2014 3:26:36 PM      MDM   Final diagnoses:  Non-intractable vomiting with nausea, vomiting of unspecified type  Dehydration    Pt improved while in the ED.  Labs are consistent with dehydration but no significant renal insufficiency.  Pt is tolerating PO fluids in the ED.  Xrays are not suggestive of obstruction.  Pt denies vertigo or problems with his balance.  Doubt CVA stroke.  Will dc home with anti emetics.  Follow up with his PCP.  Pt is comfortable with this plan.    Dorie Rank, MD 05/24/14 1819  After pt left, during the lab downtime I was handed a UA results.  Pt appears to have a uti.  Will dc  home on rx for keflex  Dorie Rank, MD 05/24/14 1907

## 2014-05-24 NOTE — Discharge Instructions (Signed)

## 2014-05-24 NOTE — ED Notes (Signed)
Pt wife stated pt's was complaining of vision more blurred and pt started vomiting. Dr Tomi Bamberger advised.

## 2014-06-10 ENCOUNTER — Emergency Department (HOSPITAL_COMMUNITY): Payer: Medicare Other

## 2014-06-10 ENCOUNTER — Inpatient Hospital Stay (HOSPITAL_COMMUNITY)
Admission: EM | Admit: 2014-06-10 | Discharge: 2014-06-21 | DRG: 640 | Disposition: A | Discharge status: Rehabilitation Facility | Payer: Medicare Other | Attending: Internal Medicine | Admitting: Internal Medicine

## 2014-06-10 ENCOUNTER — Encounter (HOSPITAL_COMMUNITY): Payer: Self-pay | Admitting: Radiology

## 2014-06-10 DIAGNOSIS — K8020 Calculus of gallbladder without cholecystitis without obstruction: Secondary | ICD-10-CM

## 2014-06-10 DIAGNOSIS — K56600 Partial intestinal obstruction, unspecified as to cause: Secondary | ICD-10-CM

## 2014-06-10 DIAGNOSIS — I739 Peripheral vascular disease, unspecified: Secondary | ICD-10-CM | POA: Diagnosis present

## 2014-06-10 DIAGNOSIS — G894 Chronic pain syndrome: Secondary | ICD-10-CM | POA: Diagnosis present

## 2014-06-10 DIAGNOSIS — Z7901 Long term (current) use of anticoagulants: Secondary | ICD-10-CM | POA: Diagnosis not present

## 2014-06-10 DIAGNOSIS — I4891 Unspecified atrial fibrillation: Secondary | ICD-10-CM | POA: Diagnosis present

## 2014-06-10 DIAGNOSIS — G40901 Epilepsy, unspecified, not intractable, with status epilepticus: Secondary | ICD-10-CM | POA: Diagnosis present

## 2014-06-10 DIAGNOSIS — E119 Type 2 diabetes mellitus without complications: Secondary | ICD-10-CM | POA: Diagnosis present

## 2014-06-10 DIAGNOSIS — E876 Hypokalemia: Secondary | ICD-10-CM

## 2014-06-10 DIAGNOSIS — J690 Pneumonitis due to inhalation of food and vomit: Secondary | ICD-10-CM

## 2014-06-10 DIAGNOSIS — J8410 Pulmonary fibrosis, unspecified: Secondary | ICD-10-CM | POA: Diagnosis present

## 2014-06-10 DIAGNOSIS — I4821 Permanent atrial fibrillation: Secondary | ICD-10-CM

## 2014-06-10 DIAGNOSIS — J440 Chronic obstructive pulmonary disease with acute lower respiratory infection: Secondary | ICD-10-CM | POA: Diagnosis present

## 2014-06-10 DIAGNOSIS — R198 Other specified symptoms and signs involving the digestive system and abdomen: Secondary | ICD-10-CM

## 2014-06-10 DIAGNOSIS — H5500 Unspecified nystagmus: Secondary | ICD-10-CM | POA: Diagnosis present

## 2014-06-10 DIAGNOSIS — W19XXXA Unspecified fall, initial encounter: Secondary | ICD-10-CM | POA: Diagnosis present

## 2014-06-10 DIAGNOSIS — E43 Unspecified severe protein-calorie malnutrition: Secondary | ICD-10-CM | POA: Diagnosis present

## 2014-06-10 DIAGNOSIS — G8220 Paraplegia, unspecified: Secondary | ICD-10-CM

## 2014-06-10 DIAGNOSIS — E785 Hyperlipidemia, unspecified: Secondary | ICD-10-CM | POA: Diagnosis present

## 2014-06-10 DIAGNOSIS — D696 Thrombocytopenia, unspecified: Secondary | ICD-10-CM | POA: Diagnosis present

## 2014-06-10 DIAGNOSIS — R600 Localized edema: Secondary | ICD-10-CM

## 2014-06-10 DIAGNOSIS — I10 Essential (primary) hypertension: Secondary | ICD-10-CM | POA: Diagnosis present

## 2014-06-10 DIAGNOSIS — F17210 Nicotine dependence, cigarettes, uncomplicated: Secondary | ICD-10-CM | POA: Diagnosis present

## 2014-06-10 DIAGNOSIS — G9340 Encephalopathy, unspecified: Secondary | ICD-10-CM

## 2014-06-10 DIAGNOSIS — K5790 Diverticulosis of intestine, part unspecified, without perforation or abscess without bleeding: Secondary | ICD-10-CM | POA: Diagnosis present

## 2014-06-10 DIAGNOSIS — K922 Gastrointestinal hemorrhage, unspecified: Secondary | ICD-10-CM

## 2014-06-10 DIAGNOSIS — E872 Acidosis: Secondary | ICD-10-CM | POA: Diagnosis present

## 2014-06-10 DIAGNOSIS — J9600 Acute respiratory failure, unspecified whether with hypoxia or hypercapnia: Secondary | ICD-10-CM | POA: Diagnosis not present

## 2014-06-10 DIAGNOSIS — I2781 Cor pulmonale (chronic): Secondary | ICD-10-CM | POA: Diagnosis present

## 2014-06-10 DIAGNOSIS — I272 Other secondary pulmonary hypertension: Secondary | ICD-10-CM | POA: Diagnosis present

## 2014-06-10 DIAGNOSIS — J9601 Acute respiratory failure with hypoxia: Secondary | ICD-10-CM | POA: Diagnosis present

## 2014-06-10 DIAGNOSIS — G40301 Generalized idiopathic epilepsy and epileptic syndromes, not intractable, with status epilepticus: Secondary | ICD-10-CM | POA: Diagnosis present

## 2014-06-10 DIAGNOSIS — G2581 Restless legs syndrome: Secondary | ICD-10-CM

## 2014-06-10 DIAGNOSIS — E860 Dehydration: Secondary | ICD-10-CM

## 2014-06-10 DIAGNOSIS — G629 Polyneuropathy, unspecified: Secondary | ICD-10-CM

## 2014-06-10 DIAGNOSIS — G40401 Other generalized epilepsy and epileptic syndromes, not intractable, with status epilepticus: Secondary | ICD-10-CM | POA: Diagnosis not present

## 2014-06-10 DIAGNOSIS — D45 Polycythemia vera: Secondary | ICD-10-CM | POA: Diagnosis present

## 2014-06-10 DIAGNOSIS — R55 Syncope and collapse: Secondary | ICD-10-CM | POA: Diagnosis present

## 2014-06-10 DIAGNOSIS — J449 Chronic obstructive pulmonary disease, unspecified: Secondary | ICD-10-CM

## 2014-06-10 DIAGNOSIS — R061 Stridor: Secondary | ICD-10-CM

## 2014-06-10 DIAGNOSIS — I482 Chronic atrial fibrillation, unspecified: Secondary | ICD-10-CM

## 2014-06-10 DIAGNOSIS — R5381 Other malaise: Secondary | ICD-10-CM

## 2014-06-10 DIAGNOSIS — D62 Acute posthemorrhagic anemia: Secondary | ICD-10-CM

## 2014-06-10 LAB — I-STAT ARTERIAL BLOOD GAS, ED
Acid-Base Excess: 8 mmol/L — ABNORMAL HIGH (ref 0.0–2.0)
Acid-Base Excess: 7 mmol/L — ABNORMAL HIGH (ref 0.0–2.0)
BICARBONATE: 35.4 meq/L — AB (ref 20.0–24.0)
Bicarbonate: 34.9 mEq/L — ABNORMAL HIGH (ref 20.0–24.0)
O2 SAT: 95 %
O2 SAT: 100 %
TCO2: 36 mmol/L (ref 0–100)
TCO2: 37 mmol/L (ref 0–100)
pCO2 arterial: 51.2 mmHg — ABNORMAL HIGH (ref 35.0–45.0)
pCO2 arterial: 62.5 mmHg (ref 35.0–45.0)
pH, Arterial: 7.442 (ref 7.350–7.450)
pH, Arterial: 7.362 (ref 7.350–7.450)
pO2, Arterial: 76 mmHg — ABNORMAL LOW (ref 80.0–100.0)
pO2, Arterial: 493 mmHg — ABNORMAL HIGH (ref 80.0–100.0)

## 2014-06-10 LAB — URINALYSIS, ROUTINE W REFLEX MICROSCOPIC
Bilirubin Urine: NEGATIVE
Glucose, UA: NEGATIVE mg/dL
Ketones, ur: 15 mg/dL — AB
Leukocytes, UA: NEGATIVE
Nitrite: NEGATIVE
PROTEIN: 100 mg/dL — AB
Specific Gravity, Urine: <1.005 — ABNORMAL LOW (ref 1.005–1.030)
Urobilinogen, UA: 1 mg/dL (ref 0.0–1.0)
pH: 5.5 (ref 5.0–8.0)

## 2014-06-10 LAB — CBC WITH DIFFERENTIAL/PLATELET
BASOS ABS: 0 10*3/uL (ref 0.0–0.1)
BASOS PCT: 0 % (ref 0–1)
Band Neutrophils: 0 % (ref 0–10)
Blasts: 0 %
EOS PCT: 0 % (ref 0–5)
Eosinophils Absolute: 0 10*3/uL (ref 0.0–0.7)
HEMATOCRIT: 57 % — AB (ref 39.0–52.0)
HEMOGLOBIN: 19.9 g/dL — AB (ref 13.0–17.0)
LYMPHS ABS: 0.5 10*3/uL — AB (ref 0.7–4.0)
LYMPHS PCT: 3 % — AB (ref 12–46)
MCH: 30.1 pg (ref 26.0–34.0)
MCHC: 34.9 g/dL (ref 30.0–36.0)
MCV: 86.1 fL (ref 78.0–100.0)
MONO ABS: 0.9 10*3/uL (ref 0.1–1.0)
Metamyelocytes Relative: 0 %
Monocytes Relative: 6 % (ref 3–12)
Myelocytes: 0 %
NEUTROS PCT: 91 % — AB (ref 43–77)
NRBC: 0 /100{WBCs}
Neutro Abs: 14.4 10*3/uL — ABNORMAL HIGH (ref 1.7–7.7)
Platelets: 202 10*3/uL (ref 150–400)
Promyelocytes Absolute: 0 %
RBC: 6.62 MIL/uL — AB (ref 4.22–5.81)
RDW: 16.2 % — ABNORMAL HIGH (ref 11.5–15.5)
WBC: 15.8 10*3/uL — AB (ref 4.0–10.5)

## 2014-06-10 LAB — COMPREHENSIVE METABOLIC PANEL
ALT: 20 U/L (ref 0–53)
AST: 34 U/L (ref 0–37)
Albumin: 3.6 g/dL (ref 3.5–5.2)
Alkaline Phosphatase: 109 U/L (ref 39–117)
Anion gap: 26 — ABNORMAL HIGH (ref 5–15)
BUN: 24 mg/dL — ABNORMAL HIGH (ref 6–23)
CALCIUM: 6.1 mg/dL — AB (ref 8.4–10.5)
CO2: 30 meq/L (ref 19–32)
Chloride: 84 mEq/L — ABNORMAL LOW (ref 96–112)
Creatinine, Ser: 1.15 mg/dL (ref 0.50–1.35)
GFR calc non Af Amer: 60 mL/min — ABNORMAL LOW (ref >90)
GFR, EST AFRICAN AMERICAN: 70 mL/min — AB (ref >90)
Glucose, Bld: 169 mg/dL — ABNORMAL HIGH (ref 70–99)
Potassium: 3.2 mEq/L — ABNORMAL LOW (ref 3.7–5.3)
Sodium: 140 mEq/L (ref 137–147)
Total Bilirubin: 1.1 mg/dL (ref 0.3–1.2)
Total Protein: 7.1 g/dL (ref 6.0–8.3)

## 2014-06-10 LAB — PROTIME-INR
INR: 1.46 (ref 0.00–1.49)
PROTHROMBIN TIME: 17.7 s — AB (ref 11.6–15.2)

## 2014-06-10 LAB — I-STAT CHEM 8, ED
BUN: 25 mg/dL — ABNORMAL HIGH (ref 6–23)
CALCIUM ION: 0.6 mmol/L — AB (ref 1.13–1.30)
CHLORIDE: 87 meq/L — AB (ref 96–112)
Creatinine, Ser: 1.2 mg/dL (ref 0.50–1.35)
Glucose, Bld: 178 mg/dL — ABNORMAL HIGH (ref 70–99)
HEMATOCRIT: 66 % — AB (ref 39.0–52.0)
HEMOGLOBIN: 22.4 g/dL — AB (ref 13.0–17.0)
Potassium: 3.1 mEq/L — ABNORMAL LOW (ref 3.7–5.3)
Sodium: 139 mEq/L (ref 137–147)
TCO2: 31 mmol/L (ref 0–100)

## 2014-06-10 LAB — URINE MICROSCOPIC-ADD ON

## 2014-06-10 LAB — I-STAT TROPONIN, ED: Troponin i, poc: 0.03 ng/mL (ref 0.00–0.08)

## 2014-06-10 LAB — TROPONIN I

## 2014-06-10 LAB — I-STAT CG4 LACTIC ACID, ED: Lactic Acid, Venous: 6.63 mmol/L — ABNORMAL HIGH (ref 0.5–2.2)

## 2014-06-10 MED ORDER — MORPHINE SULFATE 4 MG/ML IJ SOLN: 4.0000 mg | Freq: Once | INTRAMUSCULAR | Status: DC

## 2014-06-10 MED ORDER — MIDAZOLAM HCL 2 MG/2ML IJ SOLN
INTRAMUSCULAR | Status: AC
Start: 2014-06-10 — End: 2014-06-11
  Filled 2014-06-10: qty 2

## 2014-06-10 MED ORDER — SUCCINYLCHOLINE CHLORIDE 20 MG/ML IJ SOLN
INTRAMUSCULAR | Status: AC
  Filled 2014-06-10: qty 1

## 2014-06-10 MED ORDER — LIDOCAINE HCL (CARDIAC) 20 MG/ML IV SOLN
INTRAVENOUS | Status: AC
  Filled 2014-06-10: qty 5

## 2014-06-10 MED ORDER — ETOMIDATE 2 MG/ML IV SOLN
INTRAVENOUS | Status: AC | PRN
  Administered 2014-06-10: 20 mg via INTRAVENOUS

## 2014-06-10 MED ORDER — DILTIAZEM HCL 100 MG IV SOLR
5.0000 mg/h | INTRAVENOUS | Status: DC
  Administered 2014-06-10: 5 mg/h via INTRAVENOUS

## 2014-06-10 MED ORDER — MIDAZOLAM HCL 2 MG/2ML IJ SOLN
INTRAMUSCULAR | Status: AC
  Administered 2014-06-10: 1 mg
  Filled 2014-06-10: qty 2

## 2014-06-10 MED ORDER — MIDAZOLAM HCL 2 MG/2ML IJ SOLN: 1.0000 mg | Freq: Once | INTRAMUSCULAR | Status: DC

## 2014-06-10 MED ORDER — MIDAZOLAM HCL 2 MG/2ML IJ SOLN
2.0000 mg | Freq: Once | INTRAMUSCULAR | Status: AC
  Administered 2014-06-10: 2 mg via INTRAVENOUS

## 2014-06-10 MED ORDER — ONDANSETRON HCL 4 MG/2ML IJ SOLN: 4.0000 mg | Freq: Once | INTRAMUSCULAR | Status: DC

## 2014-06-10 MED ORDER — ROCURONIUM BROMIDE 50 MG/5ML IV SOLN
INTRAVENOUS | Status: AC
  Filled 2014-06-10: qty 2

## 2014-06-10 MED ORDER — LEVETIRACETAM IN NACL 1000 MG/100ML IV SOLN: 1000.0000 mg | Freq: Once | INTRAVENOUS | Status: DC

## 2014-06-10 MED ORDER — LORAZEPAM 2 MG/ML IJ SOLN
INTRAMUSCULAR | Status: AC
  Filled 2014-06-10: qty 1

## 2014-06-10 MED ORDER — ETOMIDATE 2 MG/ML IV SOLN
INTRAVENOUS | Status: AC
  Filled 2014-06-10: qty 20

## 2014-06-10 MED ORDER — IOHEXOL 350 MG/ML SOLN
100.0000 mL | Freq: Once | INTRAVENOUS | Status: AC | PRN
  Administered 2014-06-10: 100 mL via INTRAVENOUS

## 2014-06-10 MED ORDER — PROPOFOL 10 MG/ML IV EMUL
INTRAVENOUS | Status: AC
  Filled 2014-06-10: qty 100

## 2014-06-10 MED ORDER — DILTIAZEM LOAD VIA INFUSION
10.0000 mg | Freq: Once | INTRAVENOUS | Status: AC
  Administered 2014-06-10: 10 mg via INTRAVENOUS
  Filled 2014-06-10: qty 10

## 2014-06-10 MED ORDER — PROPOFOL 10 MG/ML IV EMUL
5.0000 ug/kg/min | INTRAVENOUS | Status: DC
  Administered 2014-06-10: 20 ug/kg/min via INTRAVENOUS

## 2014-06-10 MED ORDER — ONDANSETRON HCL 4 MG/2ML IJ SOLN
4.0000 mg | Freq: Once | INTRAMUSCULAR | Status: DC
  Filled 2014-06-10: qty 2

## 2014-06-10 MED ORDER — LEVETIRACETAM IN NACL 1000 MG/100ML IV SOLN
1000.0000 mg | Freq: Once | INTRAVENOUS | Status: AC
  Administered 2014-06-10: 1000 mg via INTRAVENOUS
  Filled 2014-06-10: qty 100

## 2014-06-10 MED ORDER — FUROSEMIDE 10 MG/ML IJ SOLN: 40.0000 mg | Freq: Once | INTRAMUSCULAR | Status: DC

## 2014-06-10 MED ORDER — ASPIRIN 81 MG PO CHEW: 324.0000 mg | CHEWABLE_TABLET | Freq: Once | ORAL | Status: DC

## 2014-06-10 NOTE — Progress Notes (Signed)
Weaned patient from NRB per doctors order. Patient is now on a 5LNC and sats are 94.

## 2014-06-10 NOTE — ED Notes (Signed)
This RN updated the pt's family members on the pt's condition.

## 2014-06-10 NOTE — ED Provider Notes (Signed)
CSN: [id number]     Arrival date & time 06/10/14  2010 History   First MD Initiated Contact with Patient 06/10/14 2034     Chief Complaint  Patient presents with  . Tachycardia     (Consider location/radiation/quality/duration/timing/severity/associated sxs/prior Treatment) HPI Comments: Patient brought to the ED by EMS after syncope versus seizure episode at home. Family states he was sitting in his recliner and went to get up and fell striking his head. His eyes rolled back in his head and unresponsive for several minutes. He is now awake and responsive. He has increased work of breathing and hypoxia. His rapid atrial fibrillation. He denies any chest pain or shortness of breath. He denies any headache. He is on apixaban for history of atrial fibrillation. Level V caveat for need for rapid intervention  The history is provided by the patient, the EMS personnel and a relative. The history is limited by the condition of the patient.    Past Medical History  Diagnosis Date  . COPD (chronic obstructive pulmonary disease)   . Diabetes mellitus   . Atrial fibrillation   . Peripheral neuropathy   . Gallstones   . Peripheral vascular disease   . IBS (irritable bowel syndrome)   . Peripheral arterial disease   . Tobacco abuse   . Hypertension   . Hyperlipidemia    Past Surgical History  Procedure Laterality Date  . Back surgery     No family history on file. History  Substance Use Topics  . Smoking status: Current Every Day Smoker -- 60 years    Types: Cigarettes  . Smokeless tobacco: Never Used     Comment: 5 cigarttes a day  . Alcohol Use: No    Review of Systems  Constitutional: Positive for activity change and appetite change. Negative for fever.  HENT: Negative for congestion and rhinorrhea.   Eyes: Negative for visual disturbance.  Respiratory: Negative for cough, chest tightness and shortness of breath.   Cardiovascular: Negative for chest pain.  Gastrointestinal:  Negative for nausea, vomiting and abdominal pain.  Genitourinary: Negative for dysuria and hematuria.  Musculoskeletal: Negative for back pain, neck pain and neck stiffness.  Skin: Negative for rash.  Neurological: Positive for dizziness and light-headedness. Negative for headaches.  A complete 10 system review of systems was obtained and all systems are negative except as noted in the HPI and PMH.      Allergies  Ciprofloxacin; Penicillins; and Sulfonamide derivatives  Home Medications   Prior to Admission medications   Medication Sig Start Date End Date Taking? Authorizing Provider  allopurinol (ZYLOPRIM) 100 MG tablet Take 100 mg by mouth daily.   Yes Historical Provider, MD  ALPRAZolam Duanne Moron) 1 MG tablet Take 1 mg by mouth daily as needed for anxiety or sleep.  12/25/12  Yes Historical Provider, MD  apixaban (ELIQUIS) 5 MG TABS tablet Take 1 tablet (5 mg total) by mouth 2 (two) times daily. 03/28/14  Yes Lorretta Harp, MD  baclofen (LIORESAL) 20 MG tablet Take 10-20 mg by mouth 2 (two) times daily. Take 10 mg morning, take 20 mg noon, 10 mg at 6 pm and 20 mg 11 pm.   Yes Historical Provider, MD  cilostazol (PLETAL) 50 MG tablet Take 50 mg by mouth 2 (two) times daily.  01/04/13  Yes Historical Provider, MD  diltiazem (CARDIZEM CD) 120 MG 24 hr capsule Take 1 capsule (120 mg total) by mouth daily. 12/07/13  Yes Lorretta Harp, MD  folic acid (  FOLVITE) 1 MG tablet Take 1 mg by mouth daily.   Yes Historical Provider, MD  Folic Acid-Vit O7-FIE P32 (FOLBEE) 2.5-25-1 MG TABS tablet Take 1 tablet by mouth daily. 11/16/13  Yes Lorretta Harp, MD  furosemide (LASIX) 40 MG tablet Take 40 mg by mouth daily.   Yes Historical Provider, MD  HYDROcodone-acetaminophen (NORCO) 10-325 MG per tablet Take 1 tablet by mouth 3 (three) times daily. For pain   Yes Historical Provider, MD  KLOR-CON M20 20 MEQ tablet Take 20 mEq by mouth daily.  12/10/12  Yes Historical Provider, MD  metFORMIN (GLUCOPHAGE)  500 MG tablet Take 500 mg by mouth 2 (two) times daily.  04/25/14  Yes Historical Provider, MD  metoprolol (LOPRESSOR) 50 MG tablet Take 25 mg by mouth every evening.    Yes Historical Provider, MD  mupirocin cream (BACTROBAN) 2 % Apply 1 application topically as needed (for skin irritation).  09/01/13  Yes Historical Provider, MD  ondansetron (ZOFRAN) 4 MG tablet Take 1 tablet (4 mg total) by mouth every 6 (six) hours. 05/24/14  Yes Dorie Rank, MD  pantoprazole (PROTONIX) 40 MG tablet Take 1 tablet (40 mg total) by mouth daily. 10/13/13  Yes Lorretta Harp, MD  ramipril (ALTACE) 2.5 MG capsule Take 1 capsule (2.5 mg total) by mouth daily. 10/18/13  Yes Lorretta Harp, MD  simvastatin (ZOCOR) 20 MG tablet Take 1 tablet (20 mg total) by mouth every evening. 11/24/13  Yes Lorretta Harp, MD  SPIRIVA HANDIHALER 18 MCG inhalation capsule Place 18 mcg into inhaler and inhale daily as needed (for shortness of breath).    Yes Historical Provider, MD  tiZANidine (ZANAFLEX) 4 MG tablet Take 2-4 mg by mouth 4 (four) times daily. 6 am- half tab 11 am- 1 tab 6 pm- half tab 11 pm- 2 tabs 07/06/13  Yes Antony Contras, MD  traZODone (DESYREL) 50 MG tablet Take 100 mg by mouth at bedtime.  06/29/13  Yes Historical Provider, MD  zolpidem (AMBIEN CR) 12.5 MG CR tablet Take 12.5 mg by mouth daily. 06/29/13  Yes Historical Provider, MD  cefUROXime (CEFTIN) 250 MG tablet Take 1 tablet (250 mg total) by mouth 2 (two) times daily with a meal. 05/24/14   Dorie Rank, MD   BP 122/95  Pulse 116  Temp(Src) 98.7 F (37.1 C) (Oral)  Resp 22  Ht 5' 10.87" (1.8 m)  Wt 205 lb 0.4 oz (93 kg)  BMI 28.70 kg/m2  SpO2 94% Physical Exam  Nursing note and vitals reviewed. Constitutional: He is oriented to person, place, and time. He appears well-developed and well-nourished. He appears distressed.  tachypneic to 30s. Hypoxia to 79% on nasal cannula  HENT:  Head: Normocephalic and atraumatic.  Mouth/Throat: Oropharynx is clear and  moist. No oropharyngeal exudate.  Eyes: Conjunctivae and EOM are normal. Pupils are equal, round, and reactive to light.  Neck: Normal range of motion. Neck supple.  No meningismus.  Cardiovascular: Normal rate, normal heart sounds and intact distal pulses.   No murmur heard. Irregular tachycardia  Pulmonary/Chest: Breath sounds normal. He is in respiratory distress. He exhibits no tenderness.  Basilar crackles  Abdominal: Soft. There is no tenderness. There is no rebound and no guarding.  Musculoskeletal: Normal range of motion. He exhibits no edema and no tenderness.  Neurological: He is alert and oriented to person, place, and time. No cranial nerve deficit. He exhibits normal muscle tone. Coordination normal.  No ataxia on finger to nose bilaterally. No pronator  drift. 5/5 strength throughout. CN 2-12 intactEqual grip strength. Sensation intact.   Skin: Skin is warm.  Psychiatric: He has a normal mood and affect. His behavior is normal.    ED Course  INTUBATION Date/Time: 06/11/2014 1:54 AM Performed by: Ezequiel Essex Authorized by: Ezequiel Essex Consent: Verbal consent obtained. The procedure was performed in an emergent situation. Risks and benefits: risks, benefits and alternatives were discussed Consent given by: power of attorney and guardian Patient understanding: patient states understanding of the procedure being performed Patient consent: the patient's understanding of the procedure matches consent given Procedure consent: procedure consent matches procedure scheduled Relevant documents: relevant documents present and verified Test results: test results available and properly labeled Site marked: the operative site was marked Imaging studies: imaging studies available Patient identity confirmed: provided demographic data and verbally with patient Time out: Immediately prior to procedure a "time out" was called to verify the correct patient, procedure, equipment,  support staff and site/side marked as required. Indications: respiratory failure and  airway protection Intubation method: video-assisted Patient status: paralyzed (RSI) Preoxygenation: nonrebreather mask and BVM Sedatives: etomidate Laryngoscope size: Mac 4 Tube size: 7.5 mm Tube type: cuffed Number of attempts: 1 Ventilation between attempts: BVM Cricoid pressure: no Cords visualized: yes Post-procedure assessment: chest rise and ETCO2 monitor Breath sounds: equal Cuff inflated: yes ETT to lip: 23 cm Tube secured with: ETT holder Chest x-ray interpreted by me and radiologist. Chest x-ray findings: endotracheal tube in appropriate position Patient tolerance: Patient tolerated the procedure well with no immediate complications.   (including critical care time) Labs Review Labs Reviewed  CBC WITH DIFFERENTIAL - Abnormal; Notable for the following:    WBC 15.8 (*)    RBC 6.62 (*)    Hemoglobin 19.9 (*)    HCT 57.0 (*)    RDW 16.2 (*)    Neutrophils Relative % 91 (*)    Lymphocytes Relative 3 (*)    Neutro Abs 14.4 (*)    Lymphs Abs 0.5 (*)    All other components within normal limits  COMPREHENSIVE METABOLIC PANEL - Abnormal; Notable for the following:    Potassium 3.2 (*)    Chloride 84 (*)    Glucose, Bld 169 (*)    BUN 24 (*)    Calcium 6.1 (*)    GFR calc non Af Amer 60 (*)    GFR calc Af Amer 70 (*)    Anion gap 26 (*)    All other components within normal limits  PROTIME-INR - Abnormal; Notable for the following:    Prothrombin Time 17.7 (*)    All other components within normal limits  URINALYSIS, ROUTINE W REFLEX MICROSCOPIC - Abnormal; Notable for the following:    Specific Gravity, Urine <1.005 (*)    Hgb urine dipstick MODERATE (*)    Ketones, ur 15 (*)    Protein, ur 100 (*)    All other components within normal limits  I-STAT ARTERIAL BLOOD GAS, ED - Abnormal; Notable for the following:    pCO2 arterial 51.2 (*)    pO2, Arterial 76.0 (*)     Bicarbonate 34.9 (*)    Acid-Base Excess 8.0 (*)    All other components within normal limits  I-STAT CG4 LACTIC ACID, ED - Abnormal; Notable for the following:    Lactic Acid, Venous 6.63 (*)    All other components within normal limits  I-STAT CHEM 8, ED - Abnormal; Notable for the following:    Potassium 3.1 (*)  Chloride 87 (*)    BUN 25 (*)    Glucose, Bld 178 (*)    Calcium, Ion 0.60 (*)    Hemoglobin 22.4 (*)    HCT 66.0 (*)    All other components within normal limits  I-STAT ARTERIAL BLOOD GAS, ED - Abnormal; Notable for the following:    pCO2 arterial 62.5 (*)    pO2, Arterial 493.0 (*)    Bicarbonate 35.4 (*)    Acid-Base Excess 7.0 (*)    All other components within normal limits  CULTURE, BLOOD (ROUTINE X 2)  CULTURE, BLOOD (ROUTINE X 2)  MRSA PCR SCREENING  TROPONIN I  URINE MICROSCOPIC-ADD ON  LACTIC ACID, PLASMA  CBC  BASIC METABOLIC PANEL  BLOOD GAS, ARTERIAL  I-STAT TROPOININ, ED    Imaging Review Ct Head Wo Contrast  06/10/2014   CLINICAL DATA:  Seizures, fall, head injury  EXAM: CT HEAD WITHOUT CONTRAST  TECHNIQUE: Contiguous axial images were obtained from the base of the skull through the vertex without intravenous contrast.  COMPARISON:  05/24/2014  FINDINGS: There is no evidence of mass effect, midline shift, or extra-axial fluid collections. There is no evidence of a space-occupying lesion or intracranial hemorrhage. There is no evidence of a cortical-based area of acute infarction. There is generalized cerebral atrophy. There is periventricular white matter low attenuation likely secondary to microangiopathy.  The ventricles and sulci are appropriate for the patient's age. The basal cisterns are patent.  Visualized portions of the orbits are unremarkable. The visualized portions of the paranasal sinuses and mastoid air cells are unremarkable. Cerebrovascular atherosclerotic calcifications are noted.  The osseous structures are unremarkable.  IMPRESSION:  No acute intracranial pathology.   Electronically Signed   By: Kathreen Devoid   On: 06/10/2014 22:49   Ct Angio Chest Pe W/cm &/or Wo Cm  06/10/2014   CLINICAL DATA:  Fall.  Shortness of breath.  Hypoxia.  EXAM: CT ANGIOGRAPHY CHEST, ABDOMEN AND PELVIS  TECHNIQUE: Multidetector CT imaging through the chest, abdomen and pelvis was performed using the standard protocol during bolus administration of intravenous contrast. Multiplanar reconstructed images and MIPs were obtained and reviewed to evaluate the vascular anatomy.  CONTRAST:  172mL OMNIPAQUE IOHEXOL 350 MG/ML SOLN  COMPARISON:  CT abdomen and pelvis 11/06/2012  FINDINGS: CTA CHEST FINDINGS  Unenhanced images demonstrate endotracheal tube in place. Coronary artery calcifications. Scattered aortic calcification. No evidence of intramural hematoma.  Images obtained during arterial phase of contrast bolus demonstrate normal caliber thoracic aorta. No evidence of aortic dissection. Great vessel origins are patent. Visualized central pulmonary arteries are patent. No evidence of significant pulmonary embolus.  No significant lymphadenopathy in the chest. Esophagus is mostly decompressed. Diffusely thick-walled esophagus is suggested, possibly due to reflux disease. No pleural effusions. Peripheral fibrosis in the lungs with diffuse emphysematous changes. No pneumothorax.  Review of the MIP images confirms the above findings.  CTA ABDOMEN AND PELVIS FINDINGS  Abdominal CT angiographic images demonstrate calcific and noncalcific changes throughout the abdominal aorta and branch vessels with mural thrombus present. No critical stenosis is demonstrated. No evidence of aortic dissection or aneurysm. The celiac axis, superior mesenteric artery, bilateral single renal arteries, inferior mesenteric artery, and the bilateral iliac, external iliac, internal iliac, and common femoral arteries appear patent.  Enlarged lateral segment of the left lobe of liver suggestive  cirrhosis. Small low-attenuation lesion in the left lobe of the liver is stable since previous study and probably represents a cyst or hemangioma. Gallbladder is mildly  distended with sludge. No discrete stones. No bile duct dilatation. Diffuse fatty atrophy of the pancreas. Spleen size is normal. Left adrenal gland nodule measures 15 mm without change since previous study. Cyst in the upper pole of the right kidney. Renal nephrogram sub are symmetrical. No hydronephrosis. Inferior vena cava and retroperitoneal lymph nodes are unremarkable. Stomach, small bowel, and colon appear normal for degree of distention. No free air or free fluid in the abdomen.  Pelvis: Diverticulosis of sigmoid colon without evidence of diverticulitis. Appendix is normal. Visualization of the low pelvis is limited due to streak artifact arising from a left hip arthroplasty. No free or loculated pelvic fluid collections identified. Foley catheter in the bladder. No pelvic mass or lymphadenopathy.  Bones: Diffuse degenerative changes throughout the spine. No destructive bone lesions appreciated.  Review of the MIP images confirms the above findings.  IMPRESSION: No evidence of thoracic or abdominal aortic aneurysm or dissection. Diffuse atherosclerotic changes. No evidence of central pulmonary embolus. 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 Unchanged appearance of hepatic and renal cysts and left adrenal gland nodule. Mild gallbladder distention with sludge. Diverticulosis without inflammatory changes.   Electronically Signed   By: Lucienne Capers M.D.   On: 06/10/2014 23:05   Dg Chest Portable 1 View  06/10/2014   CLINICAL DATA:  Endotracheal tube placement.  EXAM: PORTABLE CHEST - 1 VIEW  COMPARISON:  06/10/2014  FINDINGS: Endotracheal tube is been placed. Tip measures 2.1 cm above the carinal. Shallow inspiration. Heart size and pulmonary vascularity are normal for technique. Interstitial changes in the lung bases  suggesting fibrosis. No focal airspace consolidation. No blunting of costophrenic angles. No pneumothorax. Calcified and tortuous aorta.  IMPRESSION: Endotracheal tube tip measures 2.1 cm above the carina.   Electronically Signed   By: Lucienne Capers M.D.   On: 06/10/2014 22:16   Dg Chest Portable 1 View  06/10/2014   CLINICAL DATA:  Tachycardia. History of hypertension, AFib, COPD, diabetes.  EXAM: PORTABLE CHEST - 1 VIEW  COMPARISON:  05/24/2014  FINDINGS: Borderline heart size. Mild pulmonary vascular prominence. Diffuse interstitial pattern similar previous study, probably representing fibrosis. Calcified granuloma in the left mid lung. No focal airspace disease or consolidation. No blunting of costophrenic angles. No pneumothorax. Calcified aorta.  IMPRESSION: Mild diffuse interstitial pattern in the lungs likely representing fibrosis. No focal consolidation. Borderline heart size.   Electronically Signed   By: Lucienne Capers M.D.   On: 06/10/2014 21:02   Ct Cta Abd/pel W/cm &/or W/o Cm  06/10/2014   CLINICAL DATA:  Fall.  Shortness of breath.  Hypoxia.  EXAM: CT ANGIOGRAPHY CHEST, ABDOMEN AND PELVIS  TECHNIQUE: Multidetector CT imaging through the chest, abdomen and pelvis was performed using the standard protocol during bolus administration of intravenous contrast. Multiplanar reconstructed images and MIPs were obtained and reviewed to evaluate the vascular anatomy.  CONTRAST:  177mL OMNIPAQUE IOHEXOL 350 MG/ML SOLN  COMPARISON:  CT abdomen and pelvis 11/06/2012  FINDINGS: CTA CHEST FINDINGS  Unenhanced images demonstrate endotracheal tube in place. Coronary artery calcifications. Scattered aortic calcification. No evidence of intramural hematoma.  Images obtained during arterial phase of contrast bolus demonstrate normal caliber thoracic aorta. No evidence of aortic dissection. Great vessel origins are patent. Visualized central pulmonary arteries are patent. No evidence of significant pulmonary  embolus.  No significant lymphadenopathy in the chest. Esophagus is mostly decompressed. Diffusely thick-walled esophagus is suggested, possibly due to reflux disease. No pleural effusions. Peripheral fibrosis in the lungs with  diffuse emphysematous changes. No pneumothorax.  Review of the MIP images confirms the above findings.  CTA ABDOMEN AND PELVIS FINDINGS  Abdominal CT angiographic images demonstrate calcific and noncalcific changes throughout the abdominal aorta and branch vessels with mural thrombus present. No critical stenosis is demonstrated. No evidence of aortic dissection or aneurysm. The celiac axis, superior mesenteric artery, bilateral single renal arteries, inferior mesenteric artery, and the bilateral iliac, external iliac, internal iliac, and common femoral arteries appear patent.  Enlarged lateral segment of the left lobe of liver suggestive cirrhosis. Small low-attenuation lesion in the left lobe of the liver is stable since previous study and probably represents a cyst or hemangioma. Gallbladder is mildly distended with sludge. No discrete stones. No bile duct dilatation. Diffuse fatty atrophy of the pancreas. Spleen size is normal. Left adrenal gland nodule measures 15 mm without change since previous study. Cyst in the upper pole of the right kidney. Renal nephrogram sub are symmetrical. No hydronephrosis. Inferior vena cava and retroperitoneal lymph nodes are unremarkable. Stomach, small bowel, and colon appear normal for degree of distention. No free air or free fluid in the abdomen.  Pelvis: Diverticulosis of sigmoid colon without evidence of diverticulitis. Appendix is normal. Visualization of the low pelvis is limited due to streak artifact arising from a left hip arthroplasty. No free or loculated pelvic fluid collections identified. Foley catheter in the bladder. No pelvic mass or lymphadenopathy.  Bones: Diffuse degenerative changes throughout the spine. No destructive bone lesions  appreciated.  Review of the MIP images confirms the above findings.  IMPRESSION: No evidence of thoracic or abdominal aortic aneurysm or dissection. Diffuse atherosclerotic changes. No evidence of central pulmonary embolus. 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 Unchanged appearance of hepatic and renal cysts and left adrenal gland nodule. Mild gallbladder distention with sludge. Diverticulosis without inflammatory changes.   Electronically Signed   By: Lucienne Capers M.D.   On: 06/10/2014 23:05     EKG Interpretation   Date/Time:  Friday June 10 2014 21:06:31 EDT Ventricular Rate:  125 PR Interval:  91 QRS Duration: 138 QT Interval:  373 QTC Calculation: 538 R Axis:   161 Text Interpretation:  Sinus tachycardia with irregular rate RBBB and LPFB  atrial fibrillation likely. Confirmed by Wyvonnia Dusky  MD, Annie Main [phone]) on  06/10/2014 9:13:28 PM      MDM   Final diagnoses:  Acute respiratory failure with hypoxia  Status epilepticus    Patient from home with elevated HR after episode of unresponsiveness, now alert and conversant.  Denies chest pain or SOB.  However, appears to have increased work of breathing and is hypoxic, requiring [place] 5L.   EKG with rapid Afib, stable RBBB.  Moving all extremities, neuro intact.  CXR with fibrotic changes.  NO CO2 retention on ABG. Elevated WBC count and hemoglobin.  No fever.  Cardizem gtt started for rapid Atrial fibrillation.  No wheezing on exam.  Consider PE but on eliquis. Elevated lactate 6. No fever.  Cultures sent. No apparent source of infection.  Witnessed tonic clonic seizure activity in the ED. No history of seizures.  Ativan given.  Lasted 1-2 minutes.  Remains postictal, not protecting airway. Intubation as above. Full code per family at bedside.  CT head negative for ICH.  No PE or aortic dissection.  Was c/o abdominal pain prior to seizure. Abdominal CT negative as well.  IV keppra given.  IV versed and  propofol as patient fighting vent, moving all extremities.  Possible  status epilepticus.   D/w Dr. [place] Bells who will admit to ICU.  Dr. Armida Sans to consult for neurology.  Family updated multiple times.  CRITICAL CARE Performed by: Ezequiel Essex Total critical care time:90 Critical care time was exclusive of separately billable procedures and treating other patients. Critical care was necessary to treat or prevent imminent or life-threatening deterioration. Critical care was time spent personally by me on the following activities: development of treatment plan with patient and/or surrogate as well as nursing, discussions with consultants, evaluation of patient's response to treatment, examination of patient, obtaining history from patient or surrogate, ordering and performing treatments and interventions, ordering and review of laboratory studies, ordering and review of radiographic studies, pulse oximetry and re-evaluation of patient's condition.     Ezequiel Essex, MD 06/11/14 0157

## 2014-06-10 NOTE — ED Notes (Signed)
Returned from CT, pt continues to move arms and cough.  Pt will open eyes when name called but will not follow simple commands

## 2014-06-10 NOTE — ED Notes (Signed)
Pt having grand mal seizure. Lasting approx 1 min.   Dr. Wyvonnia Dusky at bedside

## 2014-06-10 NOTE — ED Notes (Signed)
Pt returned from CT °

## 2014-06-10 NOTE — Progress Notes (Signed)
[facility] Progress Note Patient Name: JASRAJ LAPPE DOB: 01-Jul-1941 MRN: [id number]   Date of Service  06/10/2014  HPI/Events of Note  Consult received for admission, status epilepticus, intubated, COPD  eICU Interventions  Admission orders written Neurology to see PCCM fellow to see     Intervention Category Evaluation Type: New Patient Evaluation  MCQUAID, DOUGLAS 06/10/2014, 11:33 PM

## 2014-06-10 NOTE — ED Notes (Signed)
Pt placed on NRB and being prepared to intubate

## 2014-06-10 NOTE — ED Notes (Signed)
Pt being bagged at this time.

## 2014-06-10 NOTE — ED Notes (Signed)
Chem-8 and cg-4 reported to Dr. Wyvonnia Dusky

## 2014-06-10 NOTE — ED Notes (Addendum)
Fields, RN called stating that the pt was restless, and fighting the ET tube. This RN took a verbal order from [place], MD for 2 mg of Versed, and brought the medication to CT for the pt.

## 2014-06-11 ENCOUNTER — Inpatient Hospital Stay (HOSPITAL_COMMUNITY): Payer: Medicare Other

## 2014-06-11 ENCOUNTER — Encounter (HOSPITAL_COMMUNITY): Payer: Self-pay | Admitting: *Deleted

## 2014-06-11 DIAGNOSIS — J9600 Acute respiratory failure, unspecified whether with hypoxia or hypercapnia: Secondary | ICD-10-CM

## 2014-06-11 DIAGNOSIS — E43 Unspecified severe protein-calorie malnutrition: Secondary | ICD-10-CM | POA: Insufficient documentation

## 2014-06-11 DIAGNOSIS — I369 Nonrheumatic tricuspid valve disorder, unspecified: Secondary | ICD-10-CM

## 2014-06-11 LAB — BLOOD GAS, ARTERIAL
ACID-BASE EXCESS: 7.3 mmol/L — AB (ref 0.0–2.0)
Bicarbonate: 31.2 mEq/L — ABNORMAL HIGH (ref 20.0–24.0)
DRAWN BY: 419771
FIO2: 0.4 %
LHR: 14 {breaths}/min
MECHVT: 600 mL
O2 SAT: 96.7 %
PATIENT TEMPERATURE: 98.6
PEEP: 5 cmH2O
TCO2: 32.5 mmol/L (ref 0–100)
pCO2 arterial: 42.8 mmHg (ref 35.0–45.0)
pH, Arterial: 7.476 — ABNORMAL HIGH (ref 7.350–7.450)
pO2, Arterial: 96.1 mmHg (ref 80.0–100.0)

## 2014-06-11 LAB — BASIC METABOLIC PANEL
Anion gap: 22 — ABNORMAL HIGH (ref 5–15)
BUN: 21 mg/dL (ref 6–23)
CO2: 26 mEq/L (ref 19–32)
Calcium: 5.7 mg/dL — CL (ref 8.4–10.5)
Chloride: 92 mEq/L — ABNORMAL LOW (ref 96–112)
Creatinine, Ser: 1.02 mg/dL (ref 0.50–1.35)
GFR calc Af Amer: 81 mL/min — ABNORMAL LOW (ref >90)
GFR calc non Af Amer: 70 mL/min — ABNORMAL LOW (ref >90)
Glucose, Bld: 118 mg/dL — ABNORMAL HIGH (ref 70–99)
Potassium: 5.1 mEq/L (ref 3.7–5.3)
SODIUM: 140 meq/L (ref 137–147)

## 2014-06-11 LAB — GLUCOSE, CAPILLARY
GLUCOSE-CAPILLARY: 114 mg/dL — AB (ref 70–99)
GLUCOSE-CAPILLARY: 69 mg/dL — AB (ref 70–99)
GLUCOSE-CAPILLARY: 82 mg/dL (ref 70–99)
Glucose-Capillary: 74 mg/dL (ref 70–99)
Glucose-Capillary: 90 mg/dL (ref 70–99)
Glucose-Capillary: 106 mg/dL — ABNORMAL HIGH (ref 70–99)

## 2014-06-11 LAB — TROPONIN I
Troponin I: <0.3 ng/mL (ref <0.30)
Troponin I: <0.3 ng/mL (ref <0.30)

## 2014-06-11 LAB — CBC
HEMATOCRIT: 50.6 % (ref 39.0–52.0)
HEMOGLOBIN: 17.5 g/dL — AB (ref 13.0–17.0)
MCH: 29.4 pg (ref 26.0–34.0)
MCHC: 34.6 g/dL (ref 30.0–36.0)
MCV: 84.9 fL (ref 78.0–100.0)
Platelets: 127 10*3/uL — ABNORMAL LOW (ref 150–400)
RBC: 5.96 MIL/uL — ABNORMAL HIGH (ref 4.22–5.81)
RDW: 16.6 % — ABNORMAL HIGH (ref 11.5–15.5)
WBC: 14.2 10*3/uL — ABNORMAL HIGH (ref 4.0–10.5)

## 2014-06-11 LAB — MRSA PCR SCREENING: MRSA by PCR: NEGATIVE

## 2014-06-11 LAB — LACTIC ACID, PLASMA: LACTIC ACID, VENOUS: 2.7 mmol/L — AB (ref 0.5–2.2)

## 2014-06-11 LAB — PROCALCITONIN: Procalcitonin: <0.1 ng/mL

## 2014-06-11 MED ORDER — POTASSIUM CHLORIDE 20 MEQ/15ML (10%) PO LIQD
40.0000 meq | Freq: Once | ORAL | Status: AC
  Administered 2014-06-11: 40 meq
  Filled 2014-06-11: qty 30

## 2014-06-11 MED ORDER — PROPOFOL 10 MG/ML IV EMUL
5.0000 ug/kg/min | INTRAVENOUS | Status: DC
  Administered 2014-06-11: 60 ug/kg/min via INTRAVENOUS
  Administered 2014-06-11: 35 ug/kg/min via INTRAVENOUS
  Administered 2014-06-11: 70 ug/kg/min via INTRAVENOUS
  Administered 2014-06-11 (×2): 60 ug/kg/min via INTRAVENOUS
  Administered 2014-06-11: 70 ug/kg/min via INTRAVENOUS
  Administered 2014-06-11: 35 ug/kg/min via INTRAVENOUS
  Administered 2014-06-12: 20 ug/kg/min via INTRAVENOUS
  Administered 2014-06-12 – 2014-06-13 (×5): 35 ug/kg/min via INTRAVENOUS
  Administered 2014-06-13 (×2): 30 ug/kg/min via INTRAVENOUS
  Administered 2014-06-14: 20 ug/kg/min via INTRAVENOUS
  Filled 2014-06-11 (×22): qty 100

## 2014-06-11 MED ORDER — FENTANYL CITRATE 0.05 MG/ML IJ SOLN
50.0000 ug | INTRAMUSCULAR | Status: DC | PRN
  Administered 2014-06-11 (×2): 100 ug via INTRAVENOUS
  Filled 2014-06-11: qty 2

## 2014-06-11 MED ORDER — FAMOTIDINE IN NACL 20-0.9 MG/50ML-% IV SOLN
20.0000 mg | Freq: Two times a day (BID) | INTRAVENOUS | Status: DC
  Administered 2014-06-11 – 2014-06-16 (×12): 20 mg via INTRAVENOUS
  Filled 2014-06-11 (×13): qty 50

## 2014-06-11 MED ORDER — ASPIRIN 81 MG PO CHEW
81.0000 mg | CHEWABLE_TABLET | Freq: Every day | ORAL | Status: DC
  Administered 2014-06-12 – 2014-06-14 (×3): 81 mg via ORAL
  Filled 2014-06-11 (×3): qty 1

## 2014-06-11 MED ORDER — BACLOFEN 20 MG PO TABS
20.0000 mg | ORAL_TABLET | ORAL | Status: DC
  Administered 2014-06-11 – 2014-06-12 (×5): 20 mg via ORAL
  Filled 2014-06-11 (×7): qty 1

## 2014-06-11 MED ORDER — METOPROLOL TARTRATE 25 MG PO TABS
25.0000 mg | ORAL_TABLET | Freq: Every evening | ORAL | Status: DC
Start: 2014-06-11 — End: 2014-06-11

## 2014-06-11 MED ORDER — SODIUM CHLORIDE 0.9 % IV SOLN
1.0000 g | Freq: Once | INTRAVENOUS | Status: AC
  Administered 2014-06-11: 1 g via INTRAVENOUS
  Filled 2014-06-11: qty 10

## 2014-06-11 MED ORDER — FENTANYL CITRATE 0.05 MG/ML IJ SOLN
50.0000 ug | INTRAMUSCULAR | Status: AC | PRN
  Administered 2014-06-11 (×3): 50 ug via INTRAVENOUS
  Filled 2014-06-11 (×2): qty 2

## 2014-06-11 MED ORDER — HEPARIN SODIUM (PORCINE) 5000 UNIT/ML IJ SOLN
5000.0000 [IU] | Freq: Three times a day (TID) | INTRAMUSCULAR | Status: DC
  Administered 2014-06-11 – 2014-06-12 (×4): 5000 [IU] via SUBCUTANEOUS
  Filled 2014-06-11 (×9): qty 1

## 2014-06-11 MED ORDER — METOPROLOL TARTRATE 25 MG PO TABS
25.0000 mg | ORAL_TABLET | Freq: Every evening | ORAL | Status: DC
  Administered 2014-06-11 – 2014-06-15 (×5): 25 mg via ORAL
  Filled 2014-06-11 (×7): qty 1

## 2014-06-11 MED ORDER — DEXTROSE 50 % IV SOLN
INTRAVENOUS | Status: AC
Start: 2014-06-11 — End: 2014-06-12
  Filled 2014-06-11: qty 50

## 2014-06-11 MED ORDER — FENTANYL CITRATE 0.05 MG/ML IJ SOLN: 50.0000 ug | Freq: Once | INTRAMUSCULAR | Status: DC

## 2014-06-11 MED ORDER — SIMVASTATIN 20 MG PO TABS
20.0000 mg | ORAL_TABLET | Freq: Every evening | ORAL | Status: DC
  Administered 2014-06-11 – 2014-06-15 (×5): 20 mg via ORAL
  Filled 2014-06-11 (×6): qty 1

## 2014-06-11 MED ORDER — FENTANYL BOLUS VIA INFUSION
25.0000 ug | INTRAVENOUS | Status: DC | PRN
  Filled 2014-06-11: qty 50

## 2014-06-11 MED ORDER — SODIUM CHLORIDE 0.9 % IV SOLN
0.0000 ug/h | INTRAVENOUS | Status: DC
  Administered 2014-06-11: 50 ug/h via INTRAVENOUS
  Administered 2014-06-12 – 2014-06-14 (×3): 100 ug/h via INTRAVENOUS
  Administered 2014-06-15 (×2): 150 ug/h via INTRAVENOUS
  Administered 2014-06-16 – 2014-06-17 (×2): 200 ug/h via INTRAVENOUS
  Filled 2014-06-11 (×7): qty 50

## 2014-06-11 MED ORDER — DEXTROSE 50 % IV SOLN
25.0000 mL | Freq: Once | INTRAVENOUS | Status: AC | PRN
  Administered 2014-06-11: 25 mL via INTRAVENOUS
  Filled 2014-06-11: qty 50

## 2014-06-11 MED ORDER — VITAL HIGH PROTEIN PO LIQD
1000.0000 mL | ORAL | Status: DC
  Administered 2014-06-11 – 2014-06-12 (×2): 1000 mL
  Filled 2014-06-11 (×4): qty 1000

## 2014-06-11 MED ORDER — PRO-STAT SUGAR FREE PO LIQD
30.0000 mL | Freq: Three times a day (TID) | ORAL | Status: DC
  Administered 2014-06-11 – 2014-06-13 (×7): 30 mL
  Filled 2014-06-11 (×9): qty 30

## 2014-06-11 MED ORDER — CETYLPYRIDINIUM CHLORIDE 0.05 % MT LIQD
7.0000 mL | Freq: Four times a day (QID) | OROMUCOSAL | Status: DC
  Administered 2014-06-11 – 2014-06-21 (×43): 7 mL via OROMUCOSAL

## 2014-06-11 MED ORDER — CHLORHEXIDINE GLUCONATE 0.12 % MT SOLN
15.0000 mL | Freq: Two times a day (BID) | OROMUCOSAL | Status: DC
  Administered 2014-06-11 – 2014-06-21 (×21): 15 mL via OROMUCOSAL
  Filled 2014-06-11 (×23): qty 15

## 2014-06-11 MED ORDER — BACLOFEN 10 MG PO TABS
10.0000 mg | ORAL_TABLET | ORAL | Status: DC
  Administered 2014-06-11 – 2014-06-16 (×10): 10 mg via ORAL
  Filled 2014-06-11 (×13): qty 1

## 2014-06-11 MED ORDER — ALBUTEROL SULFATE (2.5 MG/3ML) 0.083% IN NEBU
2.5000 mg | INHALATION_SOLUTION | RESPIRATORY_TRACT | Status: DC | PRN
  Administered 2014-06-14: 2.5 mg via RESPIRATORY_TRACT
  Filled 2014-06-11: qty 3

## 2014-06-11 MED ORDER — LACTATED RINGERS IV SOLN
INTRAVENOUS | Status: DC
  Administered 2014-06-11: 05:00:00 via INTRAVENOUS

## 2014-06-11 MED ORDER — FENTANYL CITRATE 0.05 MG/ML IJ SOLN
50.0000 ug | INTRAMUSCULAR | Status: DC | PRN
  Administered 2014-06-11: 50 ug via INTRAVENOUS
  Filled 2014-06-11 (×3): qty 2

## 2014-06-11 MED ORDER — DILTIAZEM HCL ER COATED BEADS 120 MG PO CP24
120.0000 mg | ORAL_CAPSULE | Freq: Every day | ORAL | Status: DC
  Filled 2014-06-11: qty 1

## 2014-06-11 MED ORDER — DILTIAZEM HCL ER COATED BEADS 120 MG PO CP24
120.0000 mg | ORAL_CAPSULE | Freq: Every day | ORAL | Status: DC
  Administered 2014-06-11 – 2014-06-13 (×3): 120 mg via ORAL
  Filled 2014-06-11 (×3): qty 1

## 2014-06-11 MED ORDER — INSULIN ASPART 100 UNIT/ML ~~LOC~~ SOLN
0.0000 [IU] | SUBCUTANEOUS | Status: DC
  Administered 2014-06-12 – 2014-06-14 (×4): 2 [IU] via SUBCUTANEOUS
  Administered 2014-06-14: 3 [IU] via SUBCUTANEOUS
  Administered 2014-06-14: 2 [IU] via SUBCUTANEOUS
  Administered 2014-06-14 – 2014-06-15 (×4): 3 [IU] via SUBCUTANEOUS
  Administered 2014-06-15: 5 [IU] via SUBCUTANEOUS
  Administered 2014-06-15 – 2014-06-16 (×6): 3 [IU] via SUBCUTANEOUS
  Administered 2014-06-16 (×2): 5 [IU] via SUBCUTANEOUS
  Administered 2014-06-16: 3 [IU] via SUBCUTANEOUS
  Administered 2014-06-16 – 2014-06-17 (×2): 5 [IU] via SUBCUTANEOUS
  Administered 2014-06-17: 3 [IU] via SUBCUTANEOUS
  Administered 2014-06-17: 5 [IU] via SUBCUTANEOUS
  Administered 2014-06-17: 2 [IU] via SUBCUTANEOUS
  Administered 2014-06-17: 3 [IU] via SUBCUTANEOUS
  Administered 2014-06-18: 2 [IU] via SUBCUTANEOUS
  Administered 2014-06-18: 3 [IU] via SUBCUTANEOUS
  Administered 2014-06-18: 8 [IU] via SUBCUTANEOUS
  Administered 2014-06-18 (×2): 2 [IU] via SUBCUTANEOUS
  Administered 2014-06-19: 5 [IU] via SUBCUTANEOUS
  Administered 2014-06-19: 2 [IU] via SUBCUTANEOUS
  Administered 2014-06-19 (×2): 3 [IU] via SUBCUTANEOUS
  Administered 2014-06-19: 2 [IU] via SUBCUTANEOUS
  Administered 2014-06-19: 5 [IU] via SUBCUTANEOUS
  Administered 2014-06-19 – 2014-06-20 (×2): 2 [IU] via SUBCUTANEOUS
  Administered 2014-06-20: 3 [IU] via SUBCUTANEOUS

## 2014-06-11 MED ORDER — SODIUM CHLORIDE 0.9 % IV SOLN: 250.0000 mL | INTRAVENOUS | Status: DC | PRN

## 2014-06-11 MED ORDER — IPRATROPIUM-ALBUTEROL 0.5-2.5 (3) MG/3ML IN SOLN
3.0000 mL | Freq: Four times a day (QID) | RESPIRATORY_TRACT | Status: DC
  Administered 2014-06-11 – 2014-06-21 (×42): 3 mL via RESPIRATORY_TRACT
  Filled 2014-06-11 (×44): qty 3

## 2014-06-11 MED ORDER — RAMIPRIL 2.5 MG PO CAPS
2.5000 mg | ORAL_CAPSULE | Freq: Every day | ORAL | Status: DC
  Filled 2014-06-11: qty 1

## 2014-06-11 NOTE — Progress Notes (Signed)
ABG    Component Value Date/Time   PHART 7.476* 06/11/2014 0500   PCO2ART 42.8 06/11/2014 0500   PO2ART 96.1 06/11/2014 0500   HCO3 31.2* 06/11/2014 0500   TCO2 32.5 06/11/2014 0500   O2SAT 96.7 06/11/2014 0500   40%, RR14, VT600, +5

## 2014-06-11 NOTE — ED Notes (Signed)
Seizure pads placed on bed

## 2014-06-11 NOTE — H&P (Signed)
PULMONARY / CRITICAL CARE MEDICINE   Name: Chad Morse MRN: [id number] DOB: 23-Jan-1941    ADMISSION DATE:  06/10/2014 CONSULTATION DATE:  06/10/2014  REFERRING MD :  Rancour  CHIEF COMPLAINT:  Seizure  INITIAL PRESENTATION:  73 y/o male with COPD and Afib who was admitted on 9/25 from the [facility] ED after he had fell at home when standing from a sitting position and struck his head.  He was briefly unresponsive afterwards so EMS was called.  He was initially conversant in the ED but then had seizure in the ED and was intubated for airway protection.  Further history could not be obtained due to intubation.  In speaking with the patient's wife, she states that he has been unwell since Labor Day, when he developed nausea and vomiting and was diagnosed with a UTI, for which he completed some antibiotics.  However, he has not been "quite right" with his GI tract since, complaining of decreased appetite, nausea, and abdominal pain.  He has not had diarrhea.  He has had very poor PO intake and has complained of his vision being blurry.  No chest pain, no cough, some dyspnea.  He continues to be a smoker, though has cut down in the last 10 days or so.  He has chronic leg swelling and pain, which limits his home activities, so he is not very active but can walk.  He does not drive and does need some help with dressing himself.     EVENTS 9/25 CT head > No Acute Intracranial pathology 9/25 CT angio chest/ab/pelv > No evidence of thoracic or abdominal aortic aneurysm or dissection, no PE, diffuse emphysema and fibrosis of the lungs, mild gallbladder wall distention with sludge, diverticulosis without inflammatory changes 9/25 admission, seizure in ED, intubated   SUBJECTIVE/OVERNIGHT/INTERVAL HX  06/11/14: restless but purposeful on WUA off diprivan. Not on pressors. MRI and EEG pending  VITAL SIGNS: Temp:  [98.7 F (37.1 C)-99.5 F (37.5 C)] 99.2 F (37.3 C) (09/26 0751) Pulse Rate:  [58-152]  76 (09/26 0802) Resp:  [15-31] 15 (09/26 0802) BP: (96-173)/(61-132) 118/74 mmHg (09/26 0802) SpO2:  [92 %-99 %] 95 % (09/26 0802) FiO2 (%):  [40 %] 40 % (09/26 0802) Weight:  [93 kg (205 lb 0.4 oz)] 93 kg (205 lb 0.4 oz) (09/26 0404) HEMODYNAMICS:   VENTILATOR SETTINGS: Vent Mode:  [-] PRVC FiO2 (%):  [40 %] 40 % Set Rate:  [14 bmp-18 bmp] 14 bmp Vt Set:  [500 mL-600 mL] 600 mL PEEP:  [5 cmH20] 5 cmH20 Plateau Pressure:  [13 AST41-96 cmH20] 13 cmH20 INTAKE / OUTPUT:  Intake/Output Summary (Last 24 hours) at 06/11/14 0941 Last data filed at 06/11/14 2229  Gross per 24 hour  Intake 3633.45 ml  Output   1000 ml  Net 2633.45 ml    PHYSICAL EXAMINATION: General:  No acute distress; intubated and sedated; appears stated age Neuro:  Withdraws to pain and localizes appropriately; does not follow commands.  No focal neuro deficits - moves all extremities to pain HEENT:  Pupils equally round and reactive to light; sclera anicteric. Mucus membranes moist.  Cardiovascular:  RRR, no MRG. No JVD. 2+ radial pulses bilaterally. 2+ pitting edema of lower extremities bilaterally Lungs:  Coarse inspiratory breath sounds; no wheezing or crackles.  Abdomen:  Soft, nontender, nondistended. Hypoactive bowel sounds Musculoskeletal:  No joint swelling or deformities noted. Normal muscle bulk Skin:  Erythematous lower extremity skin, with changes consistent with chronic venous stasis  LABS: PULMONARY  Recent Labs Lab 06/10/14 2054 06/10/14 2057 06/10/14 2311 06/11/14 0500  PHART  --  7.442 7.362 7.476*  PCO2ART  --  51.2* 62.5* 42.8  PO2ART  --  76.0* 493.0* 96.1  HCO3  --  34.9* 35.4* 31.2*  TCO2 31 36 37 32.5  O2SAT  --  95.0 100.0 96.7    CBC  Recent Labs Lab 06/10/14 2036 06/10/14 2054 06/11/14 0340  HGB 19.9* 22.4* 17.5*  HCT 57.0* 66.0* 50.6  WBC 15.8*  --  14.2*  PLT 202  --  127*    COAGULATION  Recent Labs Lab 06/10/14 2036  INR 1.46    CARDIAC   Recent  Labs Lab 06/10/14 2036  TROPONINI <0.30   No results found for this basename: PROBNP,  in the last 168 hours   CHEMISTRY  Recent Labs Lab 06/10/14 2036 06/10/14 2054 06/11/14 0340  NA 140 139 140  K 3.2* 3.1* 5.1  CL 84* 87* 92*  CO2 30  --  26  GLUCOSE 169* 178* 118*  BUN 24* 25* 21  CREATININE 1.15 1.20 1.02  CALCIUM 6.1*  --  5.7*   Estimated Creatinine Clearance: 72.9 ml/min (by C-G formula based on Cr of 1.02).   LIVER  Recent Labs Lab 06/10/14 2036  AST 34  ALT 20  ALKPHOS 109  BILITOT 1.1  PROT 7.1  ALBUMIN 3.6  INR 1.46     INFECTIOUS  Recent Labs Lab 06/10/14 2054 06/11/14 0153  LATICACIDVEN 6.63* 2.7*     ENDOCRINE CBG (last 3)   Recent Labs  06/11/14 0407 06/11/14 0756  GLUCAP 114* 74         IMAGING x48h Ct Head Wo Contrast  06/10/2014   CLINICAL DATA:  Seizures, fall, head injury  EXAM: CT HEAD WITHOUT CONTRAST  TECHNIQUE: Contiguous axial images were obtained from the base of the skull through the vertex without intravenous contrast.  COMPARISON:  05/24/2014  FINDINGS: There is no evidence of mass effect, midline shift, or extra-axial fluid collections. There is no evidence of a space-occupying lesion or intracranial hemorrhage. There is no evidence of a cortical-based area of acute infarction. There is generalized cerebral atrophy. There is periventricular white matter low attenuation likely secondary to microangiopathy.  The ventricles and sulci are appropriate for the patient's age. The basal cisterns are patent.  Visualized portions of the orbits are unremarkable. The visualized portions of the paranasal sinuses and mastoid air cells are unremarkable. Cerebrovascular atherosclerotic calcifications are noted.  The osseous structures are unremarkable.  IMPRESSION: No acute intracranial pathology.   Electronically Signed   By: Kathreen Devoid   On: 06/10/2014 22:49   Ct Angio Chest Pe W/cm &/or Wo Cm  06/10/2014   CLINICAL DATA:   Fall.  Shortness of breath.  Hypoxia.  EXAM: CT ANGIOGRAPHY CHEST, ABDOMEN AND PELVIS  TECHNIQUE: Multidetector CT imaging through the chest, abdomen and pelvis was performed using the standard protocol during bolus administration of intravenous contrast. Multiplanar reconstructed images and MIPs were obtained and reviewed to evaluate the vascular anatomy.  CONTRAST:  185mL OMNIPAQUE IOHEXOL 350 MG/ML SOLN  COMPARISON:  CT abdomen and pelvis 11/06/2012  FINDINGS: CTA CHEST FINDINGS  Unenhanced images demonstrate endotracheal tube in place. Coronary artery calcifications. Scattered aortic calcification. No evidence of intramural hematoma.  Images obtained during arterial phase of contrast bolus demonstrate normal caliber thoracic aorta. No evidence of aortic dissection. Great vessel origins are patent. Visualized central pulmonary arteries are patent. No evidence of significant pulmonary  embolus.  No significant lymphadenopathy in the chest. Esophagus is mostly decompressed. Diffusely thick-walled esophagus is suggested, possibly due to reflux disease. No pleural effusions. Peripheral fibrosis in the lungs with diffuse emphysematous changes. No pneumothorax.  Review of the MIP images confirms the above findings.  CTA ABDOMEN AND PELVIS FINDINGS  Abdominal CT angiographic images demonstrate calcific and noncalcific changes throughout the abdominal aorta and branch vessels with mural thrombus present. No critical stenosis is demonstrated. No evidence of aortic dissection or aneurysm. The celiac axis, superior mesenteric artery, bilateral single renal arteries, inferior mesenteric artery, and the bilateral iliac, external iliac, internal iliac, and common femoral arteries appear patent.  Enlarged lateral segment of the left lobe of liver suggestive cirrhosis. Small low-attenuation lesion in the left lobe of the liver is stable since previous study and probably represents a cyst or hemangioma. Gallbladder is mildly  distended with sludge. No discrete stones. No bile duct dilatation. Diffuse fatty atrophy of the pancreas. Spleen size is normal. Left adrenal gland nodule measures 15 mm without change since previous study. Cyst in the upper pole of the right kidney. Renal nephrogram sub are symmetrical. No hydronephrosis. Inferior vena cava and retroperitoneal lymph nodes are unremarkable. Stomach, small bowel, and colon appear normal for degree of distention. No free air or free fluid in the abdomen.  Pelvis: Diverticulosis of sigmoid colon without evidence of diverticulitis. Appendix is normal. Visualization of the low pelvis is limited due to streak artifact arising from a left hip arthroplasty. No free or loculated pelvic fluid collections identified. Foley catheter in the bladder. No pelvic mass or lymphadenopathy.  Bones: Diffuse degenerative changes throughout the spine. No destructive bone lesions appreciated.  Review of the MIP images confirms the above findings.  IMPRESSION: No evidence of thoracic or abdominal aortic aneurysm or dissection. Diffuse atherosclerotic changes. No evidence of central pulmonary embolus. 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 Unchanged appearance of hepatic and renal cysts and left adrenal gland nodule. Mild gallbladder distention with sludge. Diverticulosis without inflammatory changes.   Electronically Signed   By: Lucienne Capers M.D.   On: 06/10/2014 23:05   Dg Chest Portable 1 View  06/10/2014   CLINICAL DATA:  Endotracheal tube placement.  EXAM: PORTABLE CHEST - 1 VIEW  COMPARISON:  06/10/2014  FINDINGS: Endotracheal tube is been placed. Tip measures 2.1 cm above the carinal. Shallow inspiration. Heart size and pulmonary vascularity are normal for technique. Interstitial changes in the lung bases suggesting fibrosis. No focal airspace consolidation. No blunting of costophrenic angles. No pneumothorax. Calcified and tortuous aorta.  IMPRESSION: Endotracheal tube tip  measures 2.1 cm above the carina.   Electronically Signed   By: Lucienne Capers M.D.   On: 06/10/2014 22:16   Dg Chest Portable 1 View  06/10/2014   CLINICAL DATA:  Tachycardia. History of hypertension, AFib, COPD, diabetes.  EXAM: PORTABLE CHEST - 1 VIEW  COMPARISON:  05/24/2014  FINDINGS: Borderline heart size. Mild pulmonary vascular prominence. Diffuse interstitial pattern similar previous study, probably representing fibrosis. Calcified granuloma in the left mid lung. No focal airspace disease or consolidation. No blunting of costophrenic angles. No pneumothorax. Calcified aorta.  IMPRESSION: Mild diffuse interstitial pattern in the lungs likely representing fibrosis. No focal consolidation. Borderline heart size.   Electronically Signed   By: Lucienne Capers M.D.   On: 06/10/2014 21:02   Ct Cta Abd/pel W/cm &/or W/o Cm  06/10/2014   CLINICAL DATA:  Fall.  Shortness of breath.  Hypoxia.  EXAM: CT  ANGIOGRAPHY CHEST, ABDOMEN AND PELVIS  TECHNIQUE: Multidetector CT imaging through the chest, abdomen and pelvis was performed using the standard protocol during bolus administration of intravenous contrast. Multiplanar reconstructed images and MIPs were obtained and reviewed to evaluate the vascular anatomy.  CONTRAST:  117mL OMNIPAQUE IOHEXOL 350 MG/ML SOLN  COMPARISON:  CT abdomen and pelvis 11/06/2012  FINDINGS: CTA CHEST FINDINGS  Unenhanced images demonstrate endotracheal tube in place. Coronary artery calcifications. Scattered aortic calcification. No evidence of intramural hematoma.  Images obtained during arterial phase of contrast bolus demonstrate normal caliber thoracic aorta. No evidence of aortic dissection. Great vessel origins are patent. Visualized central pulmonary arteries are patent. No evidence of significant pulmonary embolus.  No significant lymphadenopathy in the chest. Esophagus is mostly decompressed. Diffusely thick-walled esophagus is suggested, possibly due to reflux disease. No  pleural effusions. Peripheral fibrosis in the lungs with diffuse emphysematous changes. No pneumothorax.  Review of the MIP images confirms the above findings.  CTA ABDOMEN AND PELVIS FINDINGS  Abdominal CT angiographic images demonstrate calcific and noncalcific changes throughout the abdominal aorta and branch vessels with mural thrombus present. No critical stenosis is demonstrated. No evidence of aortic dissection or aneurysm. The celiac axis, superior mesenteric artery, bilateral single renal arteries, inferior mesenteric artery, and the bilateral iliac, external iliac, internal iliac, and common femoral arteries appear patent.  Enlarged lateral segment of the left lobe of liver suggestive cirrhosis. Small low-attenuation lesion in the left lobe of the liver is stable since previous study and probably represents a cyst or hemangioma. Gallbladder is mildly distended with sludge. No discrete stones. No bile duct dilatation. Diffuse fatty atrophy of the pancreas. Spleen size is normal. Left adrenal gland nodule measures 15 mm without change since previous study. Cyst in the upper pole of the right kidney. Renal nephrogram sub are symmetrical. No hydronephrosis. Inferior vena cava and retroperitoneal lymph nodes are unremarkable. Stomach, small bowel, and colon appear normal for degree of distention. No free air or free fluid in the abdomen.  Pelvis: Diverticulosis of sigmoid colon without evidence of diverticulitis. Appendix is normal. Visualization of the low pelvis is limited due to streak artifact arising from a left hip arthroplasty. No free or loculated pelvic fluid collections identified. Foley catheter in the bladder. No pelvic mass or lymphadenopathy.  Bones: Diffuse degenerative changes throughout the spine. No destructive bone lesions appreciated.  Review of the MIP images confirms the above findings.  IMPRESSION: No evidence of thoracic or abdominal aortic aneurysm or dissection. Diffuse atherosclerotic  changes. No evidence of central pulmonary embolus. 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 Unchanged appearance of hepatic and renal cysts and left adrenal gland nodule. Mild gallbladder distention with sludge. Diverticulosis without inflammatory changes.   Electronically Signed   By: Lucienne Capers M.D.   On: 06/10/2014 23:05      ASSESSMENT / PLAN:  PULMONARY OETT 9/25 >> A: Acute respiratory failure due to inability to protect airway COPD with emphysema and ?fibrosis on CT chest (very mild appearing left lower lobe, consider aspiration).  Hgb of 22 on presentation suggests chronic hypoxia   - does not meet SBT criteria due to agitation  P:   Full vent support - ABG appropriate Daily SBT/WUA/CXR Scheduled duoneb and prn albuterol  CARDIOVASCULAR CVL n/a A: Afib with baseline echo showing cor pulmonale Hypertension Peripheral vascular disease Syncope >> etiology seizure? Consider valvular disease, arrhythmia, worsening pulmonary hypertension (secondary to lung disease), and orthostasis due to dehydration    - normotensive  P:  Restart ASA Continue home metoprolol, dilt, , statin Dc ace inhibitor in presence of COPD Restart Eliquis if OK by neurology Restart pletal when taking PO Echo: If echo unrevealing and etiology of syncope not felt to be seizure consider RHC and/or EP eval or holder monitoring    RENAL A:   Lactic acidosis presumably from seizure; improving P:   Monitor BMET and UOP Replace electrolytes as needed Repeat lactic acid   GASTROINTESTINAL A:  Diverticulosis Gallbladder sludge but no evidence of cholecystitis History of recent nausea/vomiting/abdominal pain with unclear etiology . CT abdomen/pelvis suggests cirrhosis. INR and albumin are reasonable so does not seem like decompensated live   - nil acute on exam  P:   Check lipase and CK Monitor abdominal exam OG tube Start tube feedings on 9/26  H2 blocker  Gentle fluids (1L  over 10hrs)  HEMATOLOGIC A:  No acute issues P:  Eliquis when OK per neurology Monitor cbc intermittently  INFECTIOUS A:  No acute issues P:   Culture if febrile Check PCT Anti-infectives   None       ENDOCRINE A:  DM2 P:   SSI q4H  NEUROLOGIC A:  Acute syncope, s/p long workup  Seizure activity  - seen by neuro 9./26/15 P:   MRI and EEG pending, Neuro following Continue propofol gtt for now Fentanyl prn RASS goal 0 Home baclofen  GI ppx: Famotidine DVT ppx: heparin subQ TID   Family updated:  06/10/14:  Discussed current status with wife Jana Half on admission. Her cell is [phone] and home is [phone] 06/11/14: Wife again updated at bedside   TODAY'S SUMMARY:  73 y/o male with syncope and possibly status epilepticus with pulmonary hypertension and known Afib.  Intubated for airway protection.  Plan full vent support, neurology work up for seizure/syncope.     The patient is critically ill with multiple organ systems failure and requires high complexity decision making for assessment and support, frequent evaluation and titration of therapies, application of advanced monitoring technologies and extensive interpretation of multiple databases.   Critical Care Time devoted to patient care services described in this note is  35  Minutes.  Dr. Brand Males, M.D., [facility].C.P Pulmonary and Critical Care Medicine Staff Physician [facility] Pager: [phone], If no answer or between  15:00h - 7:00h: call [phone]  06/11/2014 9:56 AM

## 2014-06-11 NOTE — ED Notes (Signed)
Family at bedside. 

## 2014-06-11 NOTE — Progress Notes (Signed)
  Echocardiogram 2D Echocardiogram has been performed.  Mauricio Po 06/11/2014, 11:06 AM

## 2014-06-11 NOTE — ED Notes (Signed)
Current BP 142/77, P 110, O2 sat 96% intubated with ventilator, RR 21.

## 2014-06-11 NOTE — Progress Notes (Addendum)
Chaplain responded to page and asked to come down to POD E.   Chad Morse and friend were present and Chaplain visited with them. After Chad Morse explained what was happening she expressed that she was ok and needed no further support. She asked for ice and Chaplain brought it.   Chaplain returned within the next half hour to check on them. They said they were ok.   Delford Field 06/11/2014

## 2014-06-11 NOTE — ED Notes (Signed)
Patient having lots of extremity movement.  Propofol infusion increased to 47mcg/kg/min.

## 2014-06-11 NOTE — Procedures (Signed)
ELECTROENCEPHALOGRAM REPORT   Patient: Chad Morse       Room #: 4B63 EEG No. ID: [id number] Age: 73 y.o.        Sex: male Referring Physician: Lake Bells Report Date:  06/11/2014        Interpreting Physician: Alexis Goodell D  History: Chad Morse is an 73 y.o. male with new onset seizures  Medications:  Scheduled: . antiseptic oral rinse  7 mL Mouth Rinse QID  . [START ON 06/12/2014] aspirin  81 mg Oral Daily  . baclofen  10 mg Oral 2 times per day  . baclofen  20 mg Oral 2 times per day  . chlorhexidine  15 mL Mouth Rinse BID  . dextrose      . diltiazem  120 mg Oral Daily  . famotidine (PEPCID) IV  20 mg Intravenous Q12H  . feeding supplement (PRO-STAT SUGAR FREE 64)  30 mL Per Tube TID WC  . feeding supplement (VITAL HIGH PROTEIN)  1,000 mL Per Tube Q24H  . fentaNYL  50 mcg Intravenous Once  . heparin  5,000 Units Subcutaneous 3 times per day  . insulin aspart  0-15 Units Subcutaneous 6 times per day  . ipratropium-albuterol  3 mL Nebulization Q6H  . metoprolol  25 mg Oral QPM  . simvastatin  20 mg Oral QPM    Conditions of Recording:  This is a 16 channel EEG carried out with the patient in the intubated and sedated state.  Description:  The background activity is low voltage and poorly organized.  It consists of a mixture of frequencies but no rhythm is maintained long enough to be considered a dominant background rhythm.  There are also intermittent periods of attenuation that occur frequently during the recording.  These periods of attenuation last up to five seconds.   No epileptiform discharges are noted.   Despite attempts at stimulation no change in the recording is noted  Hyperventilation and intermittent photic stimulation were not performed.   IMPRESSION: This is an abnormal electroencephalogram that is likely related to sedation.  No epileptiform activity is noted.     Alexis Goodell, MD [facility] [phone] 06/11/2014, 5:04 PM

## 2014-06-11 NOTE — Consult Note (Signed)
NEURO HOSPITALIST CONSULT NOTE    Reason for Consult: new onset seizures  HPI:                                                                                                                                          Chad Morse is an 73 y.o. male with a past medical history significant for HTN, hyperlipidemia, DM, atrial fibrillation on xarelto, peripheral vascular disease, tobacco abuse, brought in after sustaining a witnessed prolonged seizure at home. Family stated that he was complaining of " feeling funny" before the seizure and subsequently started having violent generalized jerking movements for few minutes and EMS was called. Patient arrived to the ED alert and awake and answering questions, but while in the ED sustained another seizure, was not protecting his airway and thus was intubated, sedated with propofol, and also loaded with 1 gram IV keppra. Also received total of 3 mg IV versed. No further seizures ever since. CT brain revealed no acute intracranial abnormality.  Leukocytosis >15,000 but afebrile. Electrolytes unimpressive.   No recent fever, infection, alcohol intake, sleep deprivation, use of illicit drugs-benzodiazepines-or narcotics. Presently, he is intubated but with open eyes and moving all limbs.  Past Medical History  Diagnosis Date  . COPD (chronic obstructive pulmonary disease)   . Diabetes mellitus   . Atrial fibrillation   . Peripheral neuropathy   . Gallstones   . Peripheral vascular disease   . IBS (irritable bowel syndrome)   . Peripheral arterial disease   . Tobacco abuse   . Hypertension   . Hyperlipidemia     Past Surgical History  Procedure Laterality Date  . Back surgery      No family history on file.   Social History:  reports that he has been smoking Cigarettes.  He has been smoking about 0.00 packs per day for the past 60 years. He has never used smokeless tobacco. He reports that he does not drink alcohol  or use illicit drugs.  Allergies  Allergen Reactions  . Ciprofloxacin Hives  . Penicillins Hives  . Sulfonamide Derivatives Hives    MEDICATIONS:  I have reviewed the patient's current medications.   ROS:  Unable to obtain due to mental status and intubation                                                                                        History obtained from chart review and family    Physical exam: restless, intubated on the vent. Blood pressure 122/95, pulse 119, temperature 98.7 F (37.1 C), temperature source Oral, resp. rate 20, height 5' 10.87" (1.8 m), weight 93 kg (205 lb 0.4 oz), SpO2 94.00%. Head: normocephalic. Neck: supple, no bruits, no JVD. Cardiac: no murmurs. Lungs: clear. Abdomen: soft, no tender, no mass. Extremities: no edema. Neurologic Examination:                                                                                                      General: Mental Status: Sedated with propofol, intubated on the vent but alert and awake. Doesn't follow commands purposefully.  Cranial Nerves: II: Discs flat bilaterally; Blinks to threat, pupils equal, round, reactive to light III,IV, VI: ptosis not present, extra-ocular motions intact bilaterally V,VII: smile symmetric, facial light touch sensation normal bilaterally VIII: hearing normal bilaterally IX,X: intubated XI: bilateral shoulder shrug no tested XII: intubated Motor: Moves upper and lower extremities symmetrically Tone and bulk:normal tone throughout; no atrophy noted Sensory: Pinprick and light touch intact throughout, bilaterally Deep Tendon Reflexes:  Right: Upper Extremity   Left: Upper extremity   biceps (C-5 to C-6) 2/4   biceps (C-5 to C-6) 2/4 tricep (C7) 2/4    triceps (C7) 2/4 Brachioradialis (C6) 2/4  Brachioradialis (C6) 2/4  Lower Extremity Lower Extremity   quadriceps (L-2 to L-4) 2/4   quadriceps (L-2 to L-4) 2/4 Achilles (S1) 2/4   Achilles (S1) 2/4 Plantars: Right: downgoing   Left: downgoing Cerebellar: Unable to test Gait:  Unable to test CV: pulses palpable throughout    Lab Results  Component Value Date/Time   CHOL 89 03/16/2013 11:39 AM    Results for orders placed during the hospital encounter of 06/10/14 (from the past 48 hour(s))  CBC WITH DIFFERENTIAL     Status: Abnormal   Collection Time    06/10/14  8:36 PM      Result Value Ref Range   WBC 15.8 (*) 4.0 - 10.5 K/uL   RBC 6.62 (*) 4.22 - 5.81 MIL/uL   Hemoglobin 19.9 (*) 13.0 - 17.0 g/dL   HCT 57.0 (*) 39.0 - 52.0 %   MCV 86.1  78.0 - 100.0 fL   MCH 30.1  26.0 - 34.0 pg   MCHC 34.9  30.0 - 36.0 g/dL   RDW 16.2 (*) 11.5 - 15.5 %   Platelets 202  150 -  400 K/uL   nRBC 0  0 /100 WBC   Neutrophils Relative % 91 (*) 43 - 77 %   Lymphocytes Relative 3 (*) 12 - 46 %   Monocytes Relative 6  3 - 12 %   Eosinophils Relative 0  0 - 5 %   Basophils Relative 0  0 - 1 %   Band Neutrophils 0  0 - 10 %   Metamyelocytes Relative 0     Myelocytes 0     Promyelocytes Absolute 0     Blasts 0     Neutro Abs 14.4 (*) 1.7 - 7.7 K/uL   Lymphs Abs 0.5 (*) 0.7 - 4.0 K/uL   Monocytes Absolute 0.9  0.1 - 1.0 K/uL   Eosinophils Absolute 0.0  0.0 - 0.7 K/uL   Basophils Absolute 0.0  0.0 - 0.1 K/uL  COMPREHENSIVE METABOLIC PANEL     Status: Abnormal   Collection Time    06/10/14  8:36 PM      Result Value Ref Range   Sodium 140  137 - 147 mEq/L   Potassium 3.2 (*) 3.7 - 5.3 mEq/L   Chloride 84 (*) 96 - 112 mEq/L   CO2 30  19 - 32 mEq/L   Glucose, Bld 169 (*) 70 - 99 mg/dL   BUN 24 (*) 6 - 23 mg/dL   Creatinine, Ser 1.15  0.50 - 1.35 mg/dL   Calcium 6.1 (*) 8.4 - 10.5 mg/dL   Comment: CRITICAL RESULT CALLED TO, READ BACK BY AND VERIFIED WITH:     LULIS,A RN 06/10/2014 2144 [address]     REPEATED TO VERIFY   Total Protein 7.1  6.0 - 8.3 g/dL   Albumin 3.6  3.5 - 5.2 g/dL   AST  34  0 - 37 U/L   ALT 20  0 - 53 U/L   Alkaline Phosphatase 109  39 - 117 U/L   Total Bilirubin 1.1  0.3 - 1.2 mg/dL   GFR calc non Af Amer 60 (*) >90 mL/min   GFR calc Af Amer 70 (*) >90 mL/min   Comment: (NOTE)     The eGFR has been calculated using the CKD EPI equation.     This calculation has not been validated in all clinical situations.     eGFR's persistently <90 mL/min signify possible Chronic Kidney     Disease.   Anion gap 26 (*) 5 - 15   Comment: RESULT CHECKED  TROPONIN I     Status: None   Collection Time    06/10/14  8:36 PM      Result Value Ref Range   Troponin I <0.30  <0.30 ng/mL   Comment:            Due to the release kinetics of cTnI,     a negative result within the first hours     of the onset of symptoms does not rule out     myocardial infarction with certainty.     If myocardial infarction is still suspected,     repeat the test at appropriate intervals.  PROTIME-INR     Status: Abnormal   Collection Time    06/10/14  8:36 PM      Result Value Ref Range   Prothrombin Time 17.7 (*) 11.6 - 15.2 seconds   INR 1.46  0.00 - 1.49  I-STAT TROPOININ, ED     Status: None   Collection Time    06/10/14  8:52 PM        Result Value Ref Range   Troponin i, poc 0.03  0.00 - 0.08 ng/mL   Comment 3            Comment: Due to the release kinetics of cTnI,     a negative result within the first hours     of the onset of symptoms does not rule out     myocardial infarction with certainty.     If myocardial infarction is still suspected,     repeat the test at appropriate intervals.  I-STAT CG4 LACTIC ACID, ED     Status: Abnormal   Collection Time    06/10/14  8:54 PM      Result Value Ref Range   Lactic Acid, Venous 6.63 (*) 0.5 - 2.2 mmol/L  I-STAT CHEM 8, ED     Status: Abnormal   Collection Time    06/10/14  8:54 PM      Result Value Ref Range   Sodium 139  137 - 147 mEq/L   Potassium 3.1 (*) 3.7 - 5.3 mEq/L   Chloride 87 (*) 96 - 112 mEq/L   BUN 25 (*)  6 - 23 mg/dL   Creatinine, Ser 1.20  0.50 - 1.35 mg/dL   Glucose, Bld 178 (*) 70 - 99 mg/dL   Calcium, Ion 0.60 (*) 1.13 - 1.30 mmol/L   TCO2 31  0 - 100 mmol/L   Hemoglobin 22.4 (*) 13.0 - 17.0 g/dL   HCT 66.0 (*) 39.0 - 52.0 %   Comment NOTIFIED PHYSICIAN    I-STAT ARTERIAL BLOOD GAS, ED     Status: Abnormal   Collection Time    06/10/14  8:57 PM      Result Value Ref Range   pH, Arterial 7.442  7.350 - 7.450   pCO2 arterial 51.2 (*) 35.0 - 45.0 mmHg   pO2, Arterial 76.0 (*) 80.0 - 100.0 mmHg   Bicarbonate 34.9 (*) 20.0 - 24.0 mEq/L   TCO2 36  0 - 100 mmol/L   O2 Saturation 95.0     Acid-Base Excess 8.0 (*) 0.0 - 2.0 mmol/L   Patient temperature 98.6 F     Sample type ARTERIAL    URINALYSIS, ROUTINE W REFLEX MICROSCOPIC     Status: Abnormal   Collection Time    06/10/14 11:02 PM      Result Value Ref Range   Color, Urine YELLOW  YELLOW   APPearance CLEAR  CLEAR   Specific Gravity, Urine <1.005 (*) 1.005 - 1.030   pH 5.5  5.0 - 8.0   Glucose, UA NEGATIVE  NEGATIVE mg/dL   Hgb urine dipstick MODERATE (*) NEGATIVE   Bilirubin Urine NEGATIVE  NEGATIVE   Ketones, ur 15 (*) NEGATIVE mg/dL   Protein, ur 100 (*) NEGATIVE mg/dL   Urobilinogen, UA 1.0  0.0 - 1.0 mg/dL   Nitrite NEGATIVE  NEGATIVE   Leukocytes, UA NEGATIVE  NEGATIVE  URINE MICROSCOPIC-ADD ON     Status: None   Collection Time    06/10/14 11:02 PM      Result Value Ref Range   Squamous Epithelial / LPF RARE  RARE   WBC, UA 0-2  <3 WBC/hpf   RBC / HPF 3-6  <3 RBC/hpf   Bacteria, UA RARE  RARE   Urine-Other MUCOUS PRESENT     Comment: LESS THAN 10 mL OF URINE SUBMITTED  I-STAT ARTERIAL BLOOD GAS, ED     Status: Abnormal   Collection Time    06/10/14 11:11  PM      Result Value Ref Range   pH, Arterial 7.362  7.350 - 7.450   pCO2 arterial 62.5 (*) 35.0 - 45.0 mmHg   pO2, Arterial 493.0 (*) 80.0 - 100.0 mmHg   Bicarbonate 35.4 (*) 20.0 - 24.0 mEq/L   TCO2 37  0 - 100 mmol/L   O2 Saturation 100.0      Acid-Base Excess 7.0 (*) 0.0 - 2.0 mmol/L   Patient temperature 98.6 F     Sample type ARTERIAL     Comment NOTIFIED PHYSICIAN      Ct Head Wo Contrast  06/10/2014   CLINICAL DATA:  Seizures, fall, head injury  EXAM: CT HEAD WITHOUT CONTRAST  TECHNIQUE: Contiguous axial images were obtained from the base of the skull through the vertex without intravenous contrast.  COMPARISON:  05/24/2014  FINDINGS: There is no evidence of mass effect, midline shift, or extra-axial fluid collections. There is no evidence of a space-occupying lesion or intracranial hemorrhage. There is no evidence of a cortical-based area of acute infarction. There is generalized cerebral atrophy. There is periventricular white matter low attenuation likely secondary to microangiopathy.  The ventricles and sulci are appropriate for the patient's age. The basal cisterns are patent.  Visualized portions of the orbits are unremarkable. The visualized portions of the paranasal sinuses and mastoid air cells are unremarkable. Cerebrovascular atherosclerotic calcifications are noted.  The osseous structures are unremarkable.  IMPRESSION: No acute intracranial pathology.   Electronically Signed   By: Hetal  Patel   On: 06/10/2014 22:49   Ct Angio Chest Pe W/cm &/or Wo Cm  06/10/2014   CLINICAL DATA:  Fall.  Shortness of breath.  Hypoxia.  EXAM: CT ANGIOGRAPHY CHEST, ABDOMEN AND PELVIS  TECHNIQUE: Multidetector CT imaging through the chest, abdomen and pelvis was performed using the standard protocol during bolus administration of intravenous contrast. Multiplanar reconstructed images and MIPs were obtained and reviewed to evaluate the vascular anatomy.  CONTRAST:  100mL OMNIPAQUE IOHEXOL 350 MG/ML SOLN  COMPARISON:  CT abdomen and pelvis 11/06/2012  FINDINGS: CTA CHEST FINDINGS  Unenhanced images demonstrate endotracheal tube in place. Coronary artery calcifications. Scattered aortic calcification. No evidence of intramural hematoma.  Images  obtained during arterial phase of contrast bolus demonstrate normal caliber thoracic aorta. No evidence of aortic dissection. Great vessel origins are patent. Visualized central pulmonary arteries are patent. No evidence of significant pulmonary embolus.  No significant lymphadenopathy in the chest. Esophagus is mostly decompressed. Diffusely thick-walled esophagus is suggested, possibly due to reflux disease. No pleural effusions. Peripheral fibrosis in the lungs with diffuse emphysematous changes. No pneumothorax.  Review of the MIP images confirms the above findings.  CTA ABDOMEN AND PELVIS FINDINGS  Abdominal CT angiographic images demonstrate calcific and noncalcific changes throughout the abdominal aorta and branch vessels with mural thrombus present. No critical stenosis is demonstrated. No evidence of aortic dissection or aneurysm. The celiac axis, superior mesenteric artery, bilateral single renal arteries, inferior mesenteric artery, and the bilateral iliac, external iliac, internal iliac, and common femoral arteries appear patent.  Enlarged lateral segment of the left lobe of liver suggestive cirrhosis. Small low-attenuation lesion in the left lobe of the liver is stable since previous study and probably represents a cyst or hemangioma. Gallbladder is mildly distended with sludge. No discrete stones. No bile duct dilatation. Diffuse fatty atrophy of the pancreas. Spleen size is normal. Left adrenal gland nodule measures 15 mm without change since previous study. Cyst in the upper pole of the   right kidney. Renal nephrogram sub are symmetrical. No hydronephrosis. Inferior vena cava and retroperitoneal lymph nodes are unremarkable. Stomach, small bowel, and colon appear normal for degree of distention. No free air or free fluid in the abdomen.  Pelvis: Diverticulosis of sigmoid colon without evidence of diverticulitis. Appendix is normal. Visualization of the low pelvis is limited due to streak artifact  arising from a left hip arthroplasty. No free or loculated pelvic fluid collections identified. Foley catheter in the bladder. No pelvic mass or lymphadenopathy.  Bones: Diffuse degenerative changes throughout the spine. No destructive bone lesions appreciated.  Review of the MIP images confirms the above findings.  IMPRESSION: No evidence of thoracic or abdominal aortic aneurysm or dissection. Diffuse atherosclerotic changes. No evidence of central pulmonary embolus. 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 Unchanged appearance of hepatic and renal cysts and left adrenal gland nodule. Mild gallbladder distention with sludge. Diverticulosis without inflammatory changes.   Electronically Signed   By: Lucienne Capers M.D.   On: 06/10/2014 23:05   Dg Chest Portable 1 View  06/10/2014   CLINICAL DATA:  Endotracheal tube placement.  EXAM: PORTABLE CHEST - 1 VIEW  COMPARISON:  06/10/2014  FINDINGS: Endotracheal tube is been placed. Tip measures 2.1 cm above the carinal. Shallow inspiration. Heart size and pulmonary vascularity are normal for technique. Interstitial changes in the lung bases suggesting fibrosis. No focal airspace consolidation. No blunting of costophrenic angles. No pneumothorax. Calcified and tortuous aorta.  IMPRESSION: Endotracheal tube tip measures 2.1 cm above the carina.   Electronically Signed   By: Lucienne Capers M.D.   On: 06/10/2014 22:16   Dg Chest Portable 1 View  06/10/2014   CLINICAL DATA:  Tachycardia. History of hypertension, AFib, COPD, diabetes.  EXAM: PORTABLE CHEST - 1 VIEW  COMPARISON:  05/24/2014  FINDINGS: Borderline heart size. Mild pulmonary vascular prominence. Diffuse interstitial pattern similar previous study, probably representing fibrosis. Calcified granuloma in the left mid lung. No focal airspace disease or consolidation. No blunting of costophrenic angles. No pneumothorax. Calcified aorta.  IMPRESSION: Mild diffuse interstitial pattern in the lungs  likely representing fibrosis. No focal consolidation. Borderline heart size.   Electronically Signed   By: Lucienne Capers M.D.   On: 06/10/2014 21:02   Ct Cta Abd/pel W/cm &/or W/o Cm  06/10/2014   CLINICAL DATA:  Fall.  Shortness of breath.  Hypoxia.  EXAM: CT ANGIOGRAPHY CHEST, ABDOMEN AND PELVIS  TECHNIQUE: Multidetector CT imaging through the chest, abdomen and pelvis was performed using the standard protocol during bolus administration of intravenous contrast. Multiplanar reconstructed images and MIPs were obtained and reviewed to evaluate the vascular anatomy.  CONTRAST:  155m OMNIPAQUE IOHEXOL 350 MG/ML SOLN  COMPARISON:  CT abdomen and pelvis 11/06/2012  FINDINGS: CTA CHEST FINDINGS  Unenhanced images demonstrate endotracheal tube in place. Coronary artery calcifications. Scattered aortic calcification. No evidence of intramural hematoma.  Images obtained during arterial phase of contrast bolus demonstrate normal caliber thoracic aorta. No evidence of aortic dissection. Great vessel origins are patent. Visualized central pulmonary arteries are patent. No evidence of significant pulmonary embolus.  No significant lymphadenopathy in the chest. Esophagus is mostly decompressed. Diffusely thick-walled esophagus is suggested, possibly due to reflux disease. No pleural effusions. Peripheral fibrosis in the lungs with diffuse emphysematous changes. No pneumothorax.  Review of the MIP images confirms the above findings.  CTA ABDOMEN AND PELVIS FINDINGS  Abdominal CT angiographic images demonstrate calcific and noncalcific changes throughout the abdominal aorta and branch vessels with  mural thrombus present. No critical stenosis is demonstrated. No evidence of aortic dissection or aneurysm. The celiac axis, superior mesenteric artery, bilateral single renal arteries, inferior mesenteric artery, and the bilateral iliac, external iliac, internal iliac, and common femoral arteries appear patent.  Enlarged lateral  segment of the left lobe of liver suggestive cirrhosis. Small low-attenuation lesion in the left lobe of the liver is stable since previous study and probably represents a cyst or hemangioma. Gallbladder is mildly distended with sludge. No discrete stones. No bile duct dilatation. Diffuse fatty atrophy of the pancreas. Spleen size is normal. Left adrenal gland nodule measures 15 mm without change since previous study. Cyst in the upper pole of the right kidney. Renal nephrogram sub are symmetrical. No hydronephrosis. Inferior vena cava and retroperitoneal lymph nodes are unremarkable. Stomach, small bowel, and colon appear normal for degree of distention. No free air or free fluid in the abdomen.  Pelvis: Diverticulosis of sigmoid colon without evidence of diverticulitis. Appendix is normal. Visualization of the low pelvis is limited due to streak artifact arising from a left hip arthroplasty. No free or loculated pelvic fluid collections identified. Foley catheter in the bladder. No pelvic mass or lymphadenopathy.  Bones: Diffuse degenerative changes throughout the spine. No destructive bone lesions appreciated.  Review of the MIP images confirms the above findings.  IMPRESSION: No evidence of thoracic or abdominal aortic aneurysm or dissection. Diffuse atherosclerotic changes. No evidence of central pulmonary embolus. 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 Unchanged appearance of hepatic and renal cysts and left adrenal gland nodule. Mild gallbladder distention with sludge. Diverticulosis without inflammatory changes.   Electronically Signed   By: Lucienne Capers M.D.   On: 06/10/2014 23:05    Assessment/Plan: 73 y/o without known risk factors for epilepsy, brought to the ED due to new onset seizures. Intubated for airway protection. Alert and awake, neuro-exam non focal. CT brain unremarkable. Elevated white count, suspect resulting from seizures as he is afebrile and doesn't look  toxic. EEG. MRI brain. Will follow up.  Dorian Pod, MD 06/11/2014, 12:32 AM

## 2014-06-11 NOTE — H&P (Addendum)
PULMONARY / CRITICAL CARE MEDICINE   Name: Chad Morse MRN: [id number] DOB: 1940-11-16    ADMISSION DATE:  06/10/2014 CONSULTATION DATE:  06/10/2014  REFERRING MD :  Rancour  CHIEF COMPLAINT:  Seizure  INITIAL PRESENTATION: 73 y/o male with no past history of seizure disorder was admitted on 9/25 after syncope and an apparent seizure at home.  He had another seizure in the ED and was intubated for airway protection.  STUDIES:  9/25 CT head > No Acute Intracranial pathology 9/25 CT angio chest/ab/pelv > No evidence of thoracic or abdominal aortic aneurysm or dissection, no PE, diffuse emphysema and fibrosis of the lungs, mild gallbladder wall distention with sludge, diverticulosis without inflammatory changes  SIGNIFICANT EVENTS: 9/25 admission, seizure in ED, intubated  HISTORY OF PRESENT ILLNESS:  73 y/o male with COPD and Afib who was admitted on 9/25 from the [facility] ED after he had fell at home when standing from a sitting position and struck his head.  He was briefly unresponsive afterwards so EMS was called.  He was initially conversant in the ED but then had seizure in the ED and was intubated for airway protection.  Further history could not be obtained due to intubation.  In speaking with the patient's wife, she states that he has been unwell since Labor Day, when he developed nausea and vomiting and was diagnosed with a UTI, for which he completed some antibiotics.  However, he has not been "quite right" with his GI tract since, complaining of decreased appetite, nausea, and abdominal pain.  He has not had diarrhea.  He has had very poor PO intake and has complained of his vision being blurry.  No chest pain, no cough, some dyspnea.  He continues to be a smoker, though has cut down in the last 10 days or so.  He has chronic leg swelling and pain, which limits his home activities, so he is not very active but can walk.  He does not drive and does need some help with dressing himself.    PAST MEDICAL HISTORY :   has a past medical history of COPD (chronic obstructive pulmonary disease); Diabetes mellitus; Atrial fibrillation; Peripheral neuropathy; Gallstones; Peripheral vascular disease; IBS (irritable bowel syndrome); Peripheral arterial disease; Tobacco abuse; Hypertension; and Hyperlipidemia.  has past surgical history that includes Back surgery. Prior to Admission medications   Medication Sig Start Date End Date Taking? Authorizing Provider  allopurinol (ZYLOPRIM) 100 MG tablet Take 100 mg by mouth daily.   Yes Historical Provider, MD  ALPRAZolam Duanne Moron) 1 MG tablet Take 1 mg by mouth daily as needed for anxiety or sleep.  12/25/12  Yes Historical Provider, MD  apixaban (ELIQUIS) 5 MG TABS tablet Take 1 tablet (5 mg total) by mouth 2 (two) times daily. 03/28/14  Yes Lorretta Harp, MD  baclofen (LIORESAL) 20 MG tablet Take 20 mg by mouth 2 (two) times daily. Take 10 mg morning, take 20 mg noon, 10 mg at 6 pm and 20 mg 11 pm.   Yes Historical Provider, MD  cilostazol (PLETAL) 50 MG tablet Take 50 mg by mouth 2 (two) times daily.  01/04/13  Yes Historical Provider, MD  diltiazem (CARDIZEM CD) 120 MG 24 hr capsule Take 1 capsule (120 mg total) by mouth daily. 12/07/13  Yes Lorretta Harp, MD  folic acid (FOLVITE) 1 MG tablet Take 1 mg by mouth daily.   Yes Historical Provider, MD  Folic Acid-Vit M2-TRZ N35 (FOLBEE) 2.5-25-1 MG TABS tablet Take 1  tablet by mouth daily. 11/16/13  Yes Lorretta Harp, MD  furosemide (LASIX) 40 MG tablet Take 40 mg by mouth daily.   Yes Historical Provider, MD  KLOR-CON M20 20 MEQ tablet Take 20 mEq by mouth daily.  12/10/12  Yes Historical Provider, MD  metFORMIN (GLUCOPHAGE) 500 MG tablet Take 500 mg by mouth 2 (two) times daily.  04/25/14  Yes Historical Provider, MD  metoprolol (LOPRESSOR) 50 MG tablet Take 25 mg by mouth every evening.    Yes Historical Provider, MD  mupirocin cream (BACTROBAN) 2 % Apply 1 application topically as needed (for  skin irritation).  09/01/13  Yes Historical Provider, MD  ondansetron (ZOFRAN) 4 MG tablet Take 1 tablet (4 mg total) by mouth every 6 (six) hours. 05/24/14  Yes Dorie Rank, MD  pantoprazole (PROTONIX) 40 MG tablet Take 1 tablet (40 mg total) by mouth daily. 10/13/13  Yes Lorretta Harp, MD  ramipril (ALTACE) 2.5 MG capsule Take 1 capsule (2.5 mg total) by mouth daily. 10/18/13  Yes Lorretta Harp, MD  simvastatin (ZOCOR) 20 MG tablet Take 1 tablet (20 mg total) by mouth every evening. 11/24/13  Yes Lorretta Harp, MD  SPIRIVA HANDIHALER 18 MCG inhalation capsule Place 18 mcg into inhaler and inhale daily as needed (for shortness of breath).    Yes Historical Provider, MD  tiZANidine (ZANAFLEX) 4 MG tablet Take 2-4 mg by mouth 4 (four) times daily. 6 am- half tab 11 am- 1 tab 6 pm- half tab 11 pm- 2 tabs 07/06/13  Yes Antony Contras, MD  traZODone (DESYREL) 50 MG tablet Take 100 mg by mouth at bedtime.  06/29/13  Yes Historical Provider, MD  zolpidem (AMBIEN CR) 12.5 MG CR tablet Take 12.5 mg by mouth daily. 06/29/13  Yes Historical Provider, MD  cefUROXime (CEFTIN) 250 MG tablet Take 1 tablet (250 mg total) by mouth 2 (two) times daily with a meal. 05/24/14   Dorie Rank, MD  HYDROcodone-acetaminophen [facility]) 10-325 MG per tablet Take 1 tablet by mouth 3 (three) times daily. For pain    Historical Provider, MD   Allergies  Allergen Reactions  . Ciprofloxacin Hives  . Penicillins Hives  . Sulfonamide Derivatives Hives    FAMILY HISTORY:  Father died in his 50s of CAD/MI Mother lived into her 71s. No known hx of lung or liver disease in family   SOCIAL HISTORY: Has smoked since his teenage years and is a current smoker.  No ETOH or illicit drug use. Lives at home with wife Jana Half.    REVIEW OF SYSTEMS:  Unable to obtain a 12 point ROS due to patient's condition.  ROS per wife as above.   SUBJECTIVE: Sedated on vent  VITAL SIGNS: Pulse Rate:  [90-152] 119 (09/26 0000) Resp:  [20-31] 20  (09/26 0000) BP: (122-173)/(69-132) 122/95 mmHg (09/26 0000) SpO2:  [92 %-99 %] 94 % (09/26 0000) FiO2 (%):  [40 %] 40 % (09/25 2158) Weight:  [93 kg (205 lb 0.4 oz)] 93 kg (205 lb 0.4 oz) (09/25 2300) HEMODYNAMICS:   VENTILATOR SETTINGS: Vent Mode:  [-] PRVC FiO2 (%):  [40 %] 40 % Set Rate:  [18 bmp] 18 bmp Vt Set:  [500 mL] 500 mL PEEP:  [5 cmH20] 5 cmH20 Plateau Pressure:  [20 cmH20] 20 cmH20 INTAKE / OUTPUT:  Intake/Output Summary (Last 24 hours) at 06/11/14 0005 Last data filed at 06/11/14 0005  Gross per 24 hour  Intake   2600 ml  Output  200 ml  Net   2400 ml    PHYSICAL EXAMINATION: General:  No acute distress; intubated and sedated; appears stated age Neuro:  Withdraws to pain and localizes appropriately; does not follow commands.  No focal neuro deficits - moves all extremities to pain HEENT:  Pupils equally round and reactive to light; sclera anicteric. Mucus membranes moist.  Cardiovascular:  RRR, no MRG. No JVD. 2+ radial pulses bilaterally. 2+ pitting edema of lower extremities bilaterally Lungs:  Coarse inspiratory breath sounds; no wheezing or crackles.  Abdomen:  Soft, nontender, nondistended. Hypoactive bowel sounds Musculoskeletal:  No joint swelling or deformities noted. Normal muscle bulk Skin:  Erythematous lower extremity skin, with changes consistent with chronic venous stasis  LABS:  CBC  Recent Labs Lab 06/10/14 2036 06/10/14 2054  WBC 15.8*  --   HGB 19.9* 22.4*  HCT 57.0* 66.0*  PLT 202  --    Coag's  Recent Labs Lab 06/10/14 2036  INR 1.46   BMET  Recent Labs Lab 06/10/14 2036 06/10/14 2054  NA 140 139  K 3.2* 3.1*  CL 84* 87*  CO2 30  --   BUN 24* 25*  CREATININE 1.15 1.20  GLUCOSE 169* 178*   Electrolytes  Recent Labs Lab 06/10/14 2036  CALCIUM 6.1*   Sepsis Markers  Recent Labs Lab 06/10/14 2054  LATICACIDVEN 6.63*   ABG  Recent Labs Lab 06/10/14 2057 06/10/14 2311  PHART 7.442 7.362  PCO2ART  51.2* 62.5*  PO2ART 76.0* 493.0*   Liver Enzymes  Recent Labs Lab 06/10/14 2036  AST 34  ALT 20  ALKPHOS 109  BILITOT 1.1  ALBUMIN 3.6   Cardiac Enzymes  Recent Labs Lab 06/10/14 2036  TROPONINI <0.30   Glucose No results found for this basename: GLUCAP,  in the last 168 hours  Imaging Ct Head Wo Contrast  06/10/2014   CLINICAL DATA:  Seizures, fall, head injury  EXAM: CT HEAD WITHOUT CONTRAST  TECHNIQUE: Contiguous axial images were obtained from the base of the skull through the vertex without intravenous contrast.  COMPARISON:  05/24/2014  FINDINGS: There is no evidence of mass effect, midline shift, or extra-axial fluid collections. There is no evidence of a space-occupying lesion or intracranial hemorrhage. There is no evidence of a cortical-based area of acute infarction. There is generalized cerebral atrophy. There is periventricular white matter low attenuation likely secondary to microangiopathy.  The ventricles and sulci are appropriate for the patient's age. The basal cisterns are patent.  Visualized portions of the orbits are unremarkable. The visualized portions of the paranasal sinuses and mastoid air cells are unremarkable. Cerebrovascular atherosclerotic calcifications are noted.  The osseous structures are unremarkable.  IMPRESSION: No acute intracranial pathology.   Electronically Signed   By: Kathreen Devoid   On: 06/10/2014 22:49   Ct Angio Chest Pe W/cm &/or Wo Cm  06/10/2014   CLINICAL DATA:  Fall.  Shortness of breath.  Hypoxia.  EXAM: CT ANGIOGRAPHY CHEST, ABDOMEN AND PELVIS  TECHNIQUE: Multidetector CT imaging through the chest, abdomen and pelvis was performed using the standard protocol during bolus administration of intravenous contrast. Multiplanar reconstructed images and MIPs were obtained and reviewed to evaluate the vascular anatomy.  CONTRAST:  186mL OMNIPAQUE IOHEXOL 350 MG/ML SOLN  COMPARISON:  CT abdomen and pelvis 11/06/2012  FINDINGS: CTA CHEST  FINDINGS  Unenhanced images demonstrate endotracheal tube in place. Coronary artery calcifications. Scattered aortic calcification. No evidence of intramural hematoma.  Images obtained during arterial phase of contrast bolus demonstrate  normal caliber thoracic aorta. No evidence of aortic dissection. Great vessel origins are patent. Visualized central pulmonary arteries are patent. No evidence of significant pulmonary embolus.  No significant lymphadenopathy in the chest. Esophagus is mostly decompressed. Diffusely thick-walled esophagus is suggested, possibly due to reflux disease. No pleural effusions. Peripheral fibrosis in the lungs with diffuse emphysematous changes. No pneumothorax.  Review of the MIP images confirms the above findings.  CTA ABDOMEN AND PELVIS FINDINGS  Abdominal CT angiographic images demonstrate calcific and noncalcific changes throughout the abdominal aorta and branch vessels with mural thrombus present. No critical stenosis is demonstrated. No evidence of aortic dissection or aneurysm. The celiac axis, superior mesenteric artery, bilateral single renal arteries, inferior mesenteric artery, and the bilateral iliac, external iliac, internal iliac, and common femoral arteries appear patent.  Enlarged lateral segment of the left lobe of liver suggestive cirrhosis. Small low-attenuation lesion in the left lobe of the liver is stable since previous study and probably represents a cyst or hemangioma. Gallbladder is mildly distended with sludge. No discrete stones. No bile duct dilatation. Diffuse fatty atrophy of the pancreas. Spleen size is normal. Left adrenal gland nodule measures 15 mm without change since previous study. Cyst in the upper pole of the right kidney. Renal nephrogram sub are symmetrical. No hydronephrosis. Inferior vena cava and retroperitoneal lymph nodes are unremarkable. Stomach, small bowel, and colon appear normal for degree of distention. No free air or free fluid in the  abdomen.  Pelvis: Diverticulosis of sigmoid colon without evidence of diverticulitis. Appendix is normal. Visualization of the low pelvis is limited due to streak artifact arising from a left hip arthroplasty. No free or loculated pelvic fluid collections identified. Foley catheter in the bladder. No pelvic mass or lymphadenopathy.  Bones: Diffuse degenerative changes throughout the spine. No destructive bone lesions appreciated.  Review of the MIP images confirms the above findings.  IMPRESSION: No evidence of thoracic or abdominal aortic aneurysm or dissection. Diffuse atherosclerotic changes. No evidence of central pulmonary embolus. 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 Unchanged appearance of hepatic and renal cysts and left adrenal gland nodule. Mild gallbladder distention with sludge. Diverticulosis without inflammatory changes.   Electronically Signed   By: Lucienne Capers M.D.   On: 06/10/2014 23:05   Dg Chest Portable 1 View  06/10/2014   CLINICAL DATA:  Endotracheal tube placement.  EXAM: PORTABLE CHEST - 1 VIEW  COMPARISON:  06/10/2014  FINDINGS: Endotracheal tube is been placed. Tip measures 2.1 cm above the carinal. Shallow inspiration. Heart size and pulmonary vascularity are normal for technique. Interstitial changes in the lung bases suggesting fibrosis. No focal airspace consolidation. No blunting of costophrenic angles. No pneumothorax. Calcified and tortuous aorta.  IMPRESSION: Endotracheal tube tip measures 2.1 cm above the carina.   Electronically Signed   By: Lucienne Capers M.D.   On: 06/10/2014 22:16   Dg Chest Portable 1 View  06/10/2014   CLINICAL DATA:  Tachycardia. History of hypertension, AFib, COPD, diabetes.  EXAM: PORTABLE CHEST - 1 VIEW  COMPARISON:  05/24/2014  FINDINGS: Borderline heart size. Mild pulmonary vascular prominence. Diffuse interstitial pattern similar previous study, probably representing fibrosis. Calcified granuloma in the left mid lung.  No focal airspace disease or consolidation. No blunting of costophrenic angles. No pneumothorax. Calcified aorta.  IMPRESSION: Mild diffuse interstitial pattern in the lungs likely representing fibrosis. No focal consolidation. Borderline heart size.   Electronically Signed   By: Lucienne Capers M.D.   On: 06/10/2014 21:02  Ct Cta Abd/pel W/cm &/or W/o Cm  06/10/2014   CLINICAL DATA:  Fall.  Shortness of breath.  Hypoxia.  EXAM: CT ANGIOGRAPHY CHEST, ABDOMEN AND PELVIS  TECHNIQUE: Multidetector CT imaging through the chest, abdomen and pelvis was performed using the standard protocol during bolus administration of intravenous contrast. Multiplanar reconstructed images and MIPs were obtained and reviewed to evaluate the vascular anatomy.  CONTRAST:  171mL OMNIPAQUE IOHEXOL 350 MG/ML SOLN  COMPARISON:  CT abdomen and pelvis 11/06/2012  FINDINGS: CTA CHEST FINDINGS  Unenhanced images demonstrate endotracheal tube in place. Coronary artery calcifications. Scattered aortic calcification. No evidence of intramural hematoma.  Images obtained during arterial phase of contrast bolus demonstrate normal caliber thoracic aorta. No evidence of aortic dissection. Great vessel origins are patent. Visualized central pulmonary arteries are patent. No evidence of significant pulmonary embolus.  No significant lymphadenopathy in the chest. Esophagus is mostly decompressed. Diffusely thick-walled esophagus is suggested, possibly due to reflux disease. No pleural effusions. Peripheral fibrosis in the lungs with diffuse emphysematous changes. No pneumothorax.  Review of the MIP images confirms the above findings.  CTA ABDOMEN AND PELVIS FINDINGS  Abdominal CT angiographic images demonstrate calcific and noncalcific changes throughout the abdominal aorta and branch vessels with mural thrombus present. No critical stenosis is demonstrated. No evidence of aortic dissection or aneurysm. The celiac axis, superior mesenteric artery,  bilateral single renal arteries, inferior mesenteric artery, and the bilateral iliac, external iliac, internal iliac, and common femoral arteries appear patent.  Enlarged lateral segment of the left lobe of liver suggestive cirrhosis. Small low-attenuation lesion in the left lobe of the liver is stable since previous study and probably represents a cyst or hemangioma. Gallbladder is mildly distended with sludge. No discrete stones. No bile duct dilatation. Diffuse fatty atrophy of the pancreas. Spleen size is normal. Left adrenal gland nodule measures 15 mm without change since previous study. Cyst in the upper pole of the right kidney. Renal nephrogram sub are symmetrical. No hydronephrosis. Inferior vena cava and retroperitoneal lymph nodes are unremarkable. Stomach, small bowel, and colon appear normal for degree of distention. No free air or free fluid in the abdomen.  Pelvis: Diverticulosis of sigmoid colon without evidence of diverticulitis. Appendix is normal. Visualization of the low pelvis is limited due to streak artifact arising from a left hip arthroplasty. No free or loculated pelvic fluid collections identified. Foley catheter in the bladder. No pelvic mass or lymphadenopathy.  Bones: Diffuse degenerative changes throughout the spine. No destructive bone lesions appreciated.  Review of the MIP images confirms the above findings.  IMPRESSION: No evidence of thoracic or abdominal aortic aneurysm or dissection. Diffuse atherosclerotic changes. No evidence of central pulmonary embolus. 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 Unchanged appearance of hepatic and renal cysts and left adrenal gland nodule. Mild gallbladder distention with sludge. Diverticulosis without inflammatory changes.   Electronically Signed   By: Lucienne Capers M.D.   On: 06/10/2014 23:05     ASSESSMENT / PLAN:  PULMONARY OETT 9/25 >> A: Acute respiratory failure due to inability to protect airway COPD with  emphysema and ?fibrosis on CT chest (very mild appearing left lower lobe, consider aspiration).  Hgb of 22 on presentation suggests chronic hypoxia P:   Full vent support - ABG appropriate Daily SBT/WUA/CXR Scheduled duoneb and prn albuterol  CARDIOVASCULAR CVL n/a A: Afib Hypertension Peripheral vascular disease Syncope >> etiology seizure? Consider valvular disease, arrhythmia, worsening pulmonary hypertension (secondary to lung disease), and orthostasis due to dehydration  P:  Restart ASA Continue home metoprolol, dilt, ACE-in, statin Tele Restart Eliquis if OK by neurology Restart pletal when taking PO Echo If echo unrevealing and etiology of syncope not felt to be seizure consider RHC and/or EP eval or holder monitoring  Replete calcium  RENAL A:  Hypokalemia Lactic acidosis presumably from seizure P:   Replete K Monitor BMET and UOP Replace electrolytes as needed Repeat lactic acid U/a with reflex culture  GASTROINTESTINAL A:  Diverticulosis Gallbladder sludge but no evidence of cholecystitis History of recent nausea/vomiting/abdominal pain with unclear etiology P:   Monitor abdominal exam OG tube Start tube feedings on 9/25 if not extubated H2 blocker  LFTs normal. CT abdomen/pelvis suggests cirrhosis. INR and albumin are reasonable so does not seem like decompensated liver disease Gentle fluids (1L over 10hrs)  HEMATOLOGIC A:  No acute issues P:  Eliquis when OK per neurology Monitor cbc intermittently  INFECTIOUS A:  No acute issues P:   Culture if febrile  ENDOCRINE A:  DM2 P:   SSI q4H  NEUROLOGIC A:  Acute syncope, s/p long workup  Seizure activity P:   Neurology to see and decide on anti-epileptic therapy CT without bleed - MRI ordered Continue propofol gtt for now Fentanyl prn RASS goal 0 Home baclofen  GI ppx: Famotidine DVT ppx: heparin subQ TID   Family updated: Discussed current status with wife Jana Half on admission. Her cell  is [phone] and home is [phone]   TODAY'S SUMMARY:  73 y/o male with syncope and possibly status epilepticus with pulmonary hypertension and known Afib.  Intubated for airway protection.  Plan full vent support, neurology work up for seizure/syncope.  I have personally obtained a history, examined the patient, evaluated laboratory and imaging results, formulated the assessment and plan and placed orders.  CRITICAL CARE: The patient is critically ill with multiple organ systems failure and requires high complexity decision making for assessment and support, frequent evaluation and titration of therapies, application of advanced monitoring technologies and extensive interpretation of multiple databases. Critical Care Time devoted to patient care services described in this note is 45 minutes.    [facility] [place] [facility] Pager: [phone]  06/11/2014, 12:05 AM

## 2014-06-11 NOTE — Progress Notes (Signed)
eLink Physician-Brief Progress Note Patient Name: Chad Morse DOB: April 03, 1941 MRN: [id number]   Date of Service  06/11/2014  HPI/Events of Note  hypocalcemia  eICU Interventions  repleted     Intervention Category Minor Interventions: Electrolytes abnormality - evaluation and management  [name] 06/11/2014, 6:33 AM

## 2014-06-11 NOTE — ED Notes (Signed)
Patient waking up and coughing against ET tube, increased propofol to 13mcg/kg/min.

## 2014-06-11 NOTE — Progress Notes (Addendum)
CRITICAL VALUE ALERT  Critical value received:  Calcium 5.7  Date of notification:  06/11/14  Time of notification:  0400   Critical value read back:Yes.    Nurse who received alert:  Raj Janus, RN  MD notified (1st page): [facility] RN, Dr. Lake Bells covering  Time of first page:  0530    MD notified (2nd page):  Time of second page:  Responding MD:    Time MD responded:    1g of Calcium Gluconate ordered

## 2014-06-11 NOTE — Progress Notes (Signed)
EEG completed, results pending. 

## 2014-06-11 NOTE — ED Notes (Signed)
Dr. Aram Beecham into assess pt at this time

## 2014-06-11 NOTE — ED Notes (Signed)
Respiratory therapist paged for assist with transport.

## 2014-06-11 NOTE — Progress Notes (Addendum)
INITIAL NUTRITION ASSESSMENT  DOCUMENTATION CODES Per approved criteria  -Severe malnutrition in the context of acute illness or injury  Pt meets criteria for SEVERE MALNUTRITION in the context of ACUTE ILLNESS as evidenced by estimated energy intake <50% of estimated energy needs for > 5 days and 13% weight loss in less than 3 months.  INTERVENTION: Initiate Vital High Protein @ 20 ml/hr via OGT and increase by 10 ml every 4 hours to goal rate of 35 ml/hr.   30 ml Prostat TID.    Tube feeding regimen provides 1140 kcal, 119 grams of protein, and 706 ml ml of H2O.  TF regimen plus current rate of propofol will provide 2013 kcal (103% of estimated energy needs)  When propofol is discontinued recommend switching TF regimen to Vital AF 1.2 @ 65 ml/hr to provide 1872 kcal and 117 grams of protein and 1264 ml of water.  NUTRITION DIAGNOSIS: Inadequate oral intake related to inability to eat as evidenced by NPO status.   Goal: Pt to meet >/= 90% of their estimated nutrition needs    Monitor:  Vent status, TF initiation, Weight trend, Labs  Reason for Assessment: Vent  73 y.o. male  Admitting Dx: <principal problem not specified>  ASSESSMENT: 73 y/o male with no past history of seizure disorder was admitted on 9/25 after syncope and an apparent seizure at home. He had another seizure in the ED and was intubated for airway protection. Gallbladder sludge but no evidence of cholecystitis  * RD paged by RN reporting MD permission to start TF, RD to order* Per pt's wife pt has been eating much less than usual for the past month partially due to nausea. She reports that pt usually eats a sandwich for lunch and a full dinner with meat and veggies but, for the past month pt has only been eating half a sandwich and picking at his dinner. Weight history shows pt has lost 30 lbs in the past 3 months- 13% weight loss severe for time frame. Pt has some mild wasting of arms, temples, and thighs.   Labs reviewed. Elevated hemoglobin, very low calcium  Patient is currently intubated on ventilator support MV: 9.1 L/min Temp (24hrs), Avg:99.1 F (37.3 C), Min:98.7 F (37.1 C), Max:99.5 F (37.5 C)  Propofol: 33.1 ml/hr (Provides 873 kcal per 24 hours)  Height: Ht Readings from Last 1 Encounters:  06/11/14 5\' 11"  (1.803 m)    Weight: Wt Readings from Last 1 Encounters:  06/11/14 205 lb 0.4 oz (93 kg)    Ideal Body Weight: 172 lbs  % Ideal Body Weight: 119%  Wt Readings from Last 10 Encounters:  06/11/14 205 lb 0.4 oz (93 kg)  05/24/14 205 lb (92.987 kg)  03/11/14 235 lb (106.595 kg)  03/07/14 235 lb (106.595 kg)  01/10/14 230 lb 3.2 oz (104.418 kg)  09/06/13 234 lb (106.142 kg)  08/10/13 227 lb 11.2 oz (103.284 kg)  07/12/13 226 lb 12.8 oz (102.876 kg)  07/06/13 225 lb (102.059 kg)  01/07/13 203 lb 3.2 oz (92.171 kg)    Usual Body Weight: 225-235 lbs (based on weight history)  % Usual Body Weight: 87%  BMI:  Body mass index is 28.61 kg/(m^2).  Estimated Nutritional Needs: Kcal: 1957 Protein: 120-140 grams Fluid: 2.5 L/day  Skin: non-pitting RLE and LLE edema  Diet Order:    EDUCATION NEEDS: -No education needs identified at this time   Intake/Output Summary (Last 24 hours) at 06/11/14 0927 Last data filed at 06/11/14 [phone]  Gross per 24 hour  Intake 3633.45 ml  Output   1000 ml  Net 2633.45 ml    Last BM: PTA   Labs:   Recent Labs Lab 06/10/14 2036 06/10/14 2054 06/11/14 0340  NA 140 139 140  K 3.2* 3.1* 5.1  CL 84* 87* 92*  CO2 30  --  26  BUN 24* 25* 21  CREATININE 1.15 1.20 1.02  CALCIUM 6.1*  --  5.7*  GLUCOSE 169* 178* 118*    CBG (last 3)   Recent Labs  06/11/14 0407 06/11/14 0756  GLUCAP 114* 74    Scheduled Meds: . antiseptic oral rinse  7 mL Mouth Rinse QID  . [START ON 06/12/2014] aspirin  81 mg Oral Daily  . baclofen  10 mg Oral 2 times per day  . baclofen  20 mg Oral 2 times per day  . chlorhexidine  15  mL Mouth Rinse BID  . diltiazem  120 mg Oral Daily  . etomidate      . famotidine (PEPCID) IV  20 mg Intravenous Q12H  . heparin  5,000 Units Subcutaneous 3 times per day  . insulin aspart  0-15 Units Subcutaneous 6 times per day  . ipratropium-albuterol  3 mL Nebulization Q6H  . lidocaine (cardiac) 100 mg/7ml      . LORazepam      . metoprolol  25 mg Oral QPM  . midazolam      . propofol      . ramipril  2.5 mg Oral Daily  . rocuronium      . simvastatin  20 mg Oral QPM  . succinylcholine        Continuous Infusions: . lactated ringers 100 mL/hr at 06/11/14 0800  . propofol 60 mcg/kg/min (06/11/14 4315)    Past Medical History  Diagnosis Date  . COPD (chronic obstructive pulmonary disease)   . Diabetes mellitus   . Atrial fibrillation   . Peripheral neuropathy   . Gallstones   . Peripheral vascular disease   . IBS (irritable bowel syndrome)   . Peripheral arterial disease   . Tobacco abuse   . Hypertension   . Hyperlipidemia     Past Surgical History  Procedure Laterality Date  . Back surgery      Pryor Ochoa RD, LDN Inpatient Clinical Dietitian Pager: [phone] After Hours Pager: [phone]

## 2014-06-12 ENCOUNTER — Inpatient Hospital Stay (HOSPITAL_COMMUNITY): Payer: Medicare Other

## 2014-06-12 LAB — TROPONIN I: Troponin I: <0.3 ng/mL (ref <0.30)

## 2014-06-12 LAB — CBC WITH DIFFERENTIAL/PLATELET
Basophils Absolute: 0 10*3/uL (ref 0.0–0.1)
Basophils Relative: 0 % (ref 0–1)
EOS ABS: 0.1 10*3/uL (ref 0.0–0.7)
EOS PCT: 1 % (ref 0–5)
HCT: 47.2 % (ref 39.0–52.0)
Hemoglobin: 15.5 g/dL (ref 13.0–17.0)
Lymphocytes Relative: 17 % (ref 12–46)
Lymphs Abs: 1.7 10*3/uL (ref 0.7–4.0)
MCH: 28.9 pg (ref 26.0–34.0)
MCHC: 32.8 g/dL (ref 30.0–36.0)
MCV: 88.1 fL (ref 78.0–100.0)
Monocytes Absolute: 1.1 10*3/uL — ABNORMAL HIGH (ref 0.1–1.0)
Monocytes Relative: 12 % (ref 3–12)
NEUTROS PCT: 70 % (ref 43–77)
Neutro Abs: 6.8 10*3/uL (ref 1.7–7.7)
Platelets: 117 10*3/uL — ABNORMAL LOW (ref 150–400)
RBC: 5.36 MIL/uL (ref 4.22–5.81)
RDW: 16.9 % — AB (ref 11.5–15.5)
WBC: 9.8 10*3/uL (ref 4.0–10.5)

## 2014-06-12 LAB — GLUCOSE, CAPILLARY
GLUCOSE-CAPILLARY: 107 mg/dL — AB (ref 70–99)
GLUCOSE-CAPILLARY: 89 mg/dL (ref 70–99)
Glucose-Capillary: 111 mg/dL — ABNORMAL HIGH (ref 70–99)
Glucose-Capillary: 102 mg/dL — ABNORMAL HIGH (ref 70–99)
Glucose-Capillary: 95 mg/dL (ref 70–99)
Glucose-Capillary: 121 mg/dL — ABNORMAL HIGH (ref 70–99)
Glucose-Capillary: 109 mg/dL — ABNORMAL HIGH (ref 70–99)

## 2014-06-12 LAB — PHOSPHORUS: Phosphorus: 3.6 mg/dL (ref 2.3–4.6)

## 2014-06-12 LAB — CK TOTAL AND CKMB (NOT AT ARMC)
CK, MB: 1.1 ng/mL (ref 0.3–4.0)
Relative Index: 0.3 (ref 0.0–2.5)
Total CK: 369 U/L — ABNORMAL HIGH (ref 7–232)

## 2014-06-12 LAB — LIPASE, BLOOD: LIPASE: 13 U/L (ref 11–59)

## 2014-06-12 LAB — PRO B NATRIURETIC PEPTIDE: Pro B Natriuretic peptide (BNP): 1108 pg/mL — ABNORMAL HIGH (ref 0–450)

## 2014-06-12 LAB — MAGNESIUM: Magnesium: 0.6 mg/dL — CL (ref 1.5–2.5)

## 2014-06-12 LAB — LACTIC ACID, PLASMA: LACTIC ACID, VENOUS: 1.2 mmol/L (ref 0.5–2.2)

## 2014-06-12 LAB — PROCALCITONIN: Procalcitonin: <0.1 ng/mL

## 2014-06-12 MED ORDER — LEVETIRACETAM IN NACL 1000 MG/100ML IV SOLN
1000.0000 mg | Freq: Once | INTRAVENOUS | Status: AC
  Administered 2014-06-12: 1000 mg via INTRAVENOUS
  Filled 2014-06-12: qty 100

## 2014-06-12 MED ORDER — LEVETIRACETAM IN NACL 500 MG/100ML IV SOLN
500.0000 mg | Freq: Two times a day (BID) | INTRAVENOUS | Status: DC
  Administered 2014-06-13 – 2014-06-16 (×8): 500 mg via INTRAVENOUS
  Filled 2014-06-12 (×9): qty 100

## 2014-06-12 MED ORDER — MAGNESIUM SULFATE 50 % IJ SOLN
6.0000 g | Freq: Once | INTRAVENOUS | Status: AC
  Administered 2014-06-12: 6 g via INTRAVENOUS
  Filled 2014-06-12: qty 12

## 2014-06-12 MED ORDER — GADOBENATE DIMEGLUMINE 529 MG/ML IV SOLN
20.0000 mL | Freq: Once | INTRAVENOUS | Status: AC | PRN
  Administered 2014-06-12: 20 mL via INTRAVENOUS

## 2014-06-12 MED ORDER — SODIUM CHLORIDE 0.9 % IV SOLN
INTRAVENOUS | Status: DC
  Administered 2014-06-12 – 2014-06-14 (×2): via INTRAVENOUS

## 2014-06-12 MED ORDER — CILOSTAZOL 50 MG PO TABS
50.0000 mg | ORAL_TABLET | Freq: Two times a day (BID) | ORAL | Status: DC
  Administered 2014-06-12 – 2014-06-16 (×9): 50 mg via ORAL
  Filled 2014-06-12 (×11): qty 1

## 2014-06-12 MED ORDER — APIXABAN 5 MG PO TABS
5.0000 mg | ORAL_TABLET | Freq: Two times a day (BID) | ORAL | Status: DC
  Administered 2014-06-12 – 2014-06-16 (×9): 5 mg via ORAL
  Filled 2014-06-12 (×10): qty 1

## 2014-06-12 NOTE — Progress Notes (Signed)
Pt became very agitated when sedation was weaned for pt's SBT. Pt became very tachycardic, increased RR and vent dyssynchrony.  RN re-sedated pt, pt placed back on full vent support.  MD aware.

## 2014-06-12 NOTE — Progress Notes (Signed)
Patient returned from MRI with nystagmus and twitching of the left shoulder and upper arm .Neurologist aware. No new orders at this time.

## 2014-06-12 NOTE — H&P (Addendum)
PULMONARY / CRITICAL CARE MEDICINE   Name: Chad Morse MRN: [id number] DOB: December 27, 1940    ADMISSION DATE:  06/10/2014 CONSULTATION DATE:  06/10/2014  REFERRING MD :  Rancour  CHIEF COMPLAINT:  Seizure  INITIAL PRESENTATION:  73 y/o male with COPD and Afib who was admitted on 9/25 from the [facility] ED after he had fell at home when standing from a sitting position and struck his head.  He was briefly unresponsive afterwards so EMS was called.  He was initially conversant in the ED but then had seizure in the ED and was intubated for airway protection.  Further history could not be obtained due to intubation.  In speaking with the patient's wife, she states that he has been unwell since Labor Day, when he developed nausea and vomiting and was diagnosed with a UTI, for which he completed some antibiotics.  However, he has not been "quite right" with his GI tract since, complaining of decreased appetite, nausea, and abdominal pain.  He has not had diarrhea.  He has had very poor PO intake and has complained of his vision being blurry.  No chest pain, no cough, some dyspnea.  He continues to be a smoker, though has cut down in the last 10 days or so.  He has chronic leg swelling and pain, which limits his home activities, so he is not very active but can walk.  He does not drive and does need some help with dressing himself.     EVENTS 9/25 CT head > No Acute Intracranial pathology 9/25 CT angio chest/ab/pelv > No evidence of thoracic or abdominal aortic aneurysm or dissection, no PE, diffuse emphysema and fibrosis of the lungs, mild gallbladder wall distention with sludge, diverticulosis without inflammatory changes 9/25 admission, seizure in ED, intubated 06/11/14: restless but purposeful on WUA off diprivan. Not on pressors. MRI and EEG pending   SUBJECTIVE/OVERNIGHT/INTERVAL HX 06/12/14: very agitated on WUA. Unable to do SBT. Mag is very low. Appears to have nystagmus  VITAL SIGNS: Temp:   [98.6 F (37 C)-99.5 F (37.5 C)] 98.7 F (37.1 C) (09/27 0750) Pulse Rate:  [34-83] 67 (09/27 0825) Resp:  [14] 14 (09/27 0825) BP: (78-156)/(45-91) 140/74 mmHg (09/27 0800) SpO2:  [93 %-97 %] 97 % (09/27 0825) FiO2 (%):  [40 %] 40 % (09/27 0840) Weight:  [103.1 kg (227 lb 4.7 oz)] 103.1 kg (227 lb 4.7 oz) (09/27 0500) HEMODYNAMICS:   VENTILATOR SETTINGS: Vent Mode:  [-] PRVC FiO2 (%):  [40 %] 40 % Set Rate:  [14 bmp] 14 bmp Vt Set:  [600 mL] 600 mL PEEP:  [5 cmH20] 5 cmH20 Pressure Support:  [5 cmH20] 5 cmH20 Plateau Pressure:  [13 cmH20-16 cmH20] 15 cmH20 INTAKE / OUTPUT:  Intake/Output Summary (Last 24 hours) at 06/12/14 0910 Last data filed at 06/12/14 0900  Gross per 24 hour  Intake 2395.99 ml  Output   1130 ml  Net 1265.99 ml    PHYSICAL EXAMINATION: General:  No acute distress; intubated and sedated; appears stated age Neuro:  Withdraws to pain and localizes appropriately.  No focal neuro deficits - moves all extremities to pain. Tracks to command. ? Has nystagmus v ocular bobbing HEENT:  Pupils equally round and reactive to light; sclera anicteric. Mucus membranes moist.  Cardiovascular:  RRR, no MRG. No JVD. 2+ radial pulses bilaterally. 2+ pitting edema of lower extremities bilaterally Lungs:  Coarse inspiratory breath sounds; no wheezing or crackles.  Abdomen:  Soft, nontender, nondistended. Hypoactive bowel sounds Musculoskeletal:  No joint swelling or deformities noted. Normal muscle bulk Skin:  Erythematous lower extremity skin, with changes consistent with chronic venous stasis  LABS: PULMONARY  Recent Labs Lab 06/10/14 2054 06/10/14 2057 06/10/14 2311 06/11/14 0500  PHART  --  7.442 7.362 7.476*  PCO2ART  --  51.2* 62.5* 42.8  PO2ART  --  76.0* 493.0* 96.1  HCO3  --  34.9* 35.4* 31.2*  TCO2 31 36 37 32.5  O2SAT  --  95.0 100.0 96.7    CBC  Recent Labs Lab 06/10/14 2036 06/10/14 2054 06/11/14 0340 06/12/14 0744  HGB 19.9* 22.4* 17.5*  15.5  HCT 57.0* 66.0* 50.6 47.2  WBC 15.8*  --  14.2* 9.8  PLT 202  --  127* 117*    COAGULATION  Recent Labs Lab 06/10/14 2036  INR 1.46    CARDIAC    Recent Labs Lab 06/10/14 2036 06/11/14 1100 06/11/14 1821 06/12/14 0154  TROPONINI <0.30 <0.30 <0.30 <0.30    Recent Labs Lab 06/12/14 0744  PROBNP 1108.0*     CHEMISTRY  Recent Labs Lab 06/10/14 2036 06/10/14 2054 06/11/14 0340 06/12/14 0744  NA 140 139 140  --   K 3.2* 3.1* 5.1  --   CL 84* 87* 92*  --   CO2 30  --  26  --   GLUCOSE 169* 178* 118*  --   BUN 24* 25* 21  --   CREATININE 1.15 1.20 1.02  --   CALCIUM 6.1*  --  5.7*  --   MG  --   --   --  0.6*  PHOS  --   --   --  3.6   Estimated Creatinine Clearance: 76.5 ml/min (by C-G formula based on Cr of 1.02).   LIVER  Recent Labs Lab 06/10/14 2036  AST 34  ALT 20  ALKPHOS 109  BILITOT 1.1  PROT 7.1  ALBUMIN 3.6  INR 1.46     INFECTIOUS  Recent Labs Lab 06/10/14 2054 06/11/14 0153 06/11/14 1100 06/12/14 0147 06/12/14 0744  LATICACIDVEN 6.63* 2.7*  --  1.2  --   PROCALCITON  --   --  <0.10  --  <0.10     ENDOCRINE CBG (last 3)   Recent Labs  06/11/14 2341 06/12/14 0450 06/12/14 0749  GLUCAP 111* 107* 102*         IMAGING x48h Dg Chest Port 1 View  06/11/2014   CLINICAL DATA:  Endotracheal tube placement.  EXAM: PORTABLE CHEST - 1 VIEW  COMPARISON:  06/10/2014  FINDINGS: Patient slightly rotated to the left. Endotracheal tube has been pulled back has tip is now approximately 4.1 cm above the carina. Nasogastric tube courses into the region of the stomach and off the inferior portion of the film. Lungs are somewhat hypoinflated with minimal left basilar/ retrocardiac opacification likely combination of atelectasis with small amount of pleural fluid. Cardiac stone silhouette and remainder the exam is unchanged.  IMPRESSION: Hypoinflation with mild left basilar opacification likely small amount of pleural fluid with  mild atelectasis. Cannot completely exclude infection.  Endotracheal tube pulled back slightly with tip 4.1 cm above the carina. Nasogastric tube courses into the region of the stomach as tip is not visualized.   Electronically Signed   By: Marin Olp M.D.   On: 06/11/2014 07:13      ASSESSMENT / PLAN:  PULMONARY OETT 9/25 >> A: Acute respiratory failure due to inability to protect airway. PE ruled out CTAG 06/10/14  - COPD with  emphysema and ?fibrosis on CT chest (very mild appearing left lower lobe, consider aspiration).  Hgb of 22 on presentation suggests chronic hypoxia   - Failed SBT a due to agitation  P:   Full vent support - ABG appropriate Daily SBT/WUA/CXR Scheduled duoneb and prn albuterol  CARDIOVASCULAR CVL n/a A: Afib with baseline echo showing cor pulmonale Hypertension Peripheral vascular disease Syncope >> etiology seizure? Consider valvular disease, arrhythmia, worsening pulmonary hypertension (secondary to lung disease), and orthostasis due to dehydration    - normotensive. ECHO with RV dilatation and dysfunction but PE ruled out.  Likel cor pulmonale  P:  Continue home ASA Restart home eliquis and pletal on 06/12/14 (ok per neuro) Continue home metoprolol, dilt, , statin AVoid home  ace inhibitor in presence of COPD    RENAL A:   Lactic acidosis presumably from seizure at admit. REsolved 06/12/14   - low mag 06/12/14 (severe)  P:   Replete mag Monitor BMET and UOP Replace electrolytes as needed Repeat lactic acid   GASTROINTESTINAL A:  Diverticulosis Gallbladder sludge but no evidence of cholecystitis History of recent nausea/vomiting/abdominal pain with unclear etiology . CT abdomen/pelvis suggests cirrhosis. INR and albumin are reasonable so does not seem like decompensated liver    - nil acute on exam      - tolerating tube feeds 06/12/14. Lipase normal. CK somewhat high  P:   monitor CK Monitor abdominal exam OG tube with  tube  feedings since 9/26  H2 blocker  Gentle fluids - change LR 100cc/h to NS at kvo  HEMATOLOGIC A:  No acute issues P:  Monitor cbc intermittently PRBC for hgb < 7gm%  INFECTIOUS A:  No acute issues. Normal PCT P:   Monitor off abx   ENDOCRINE A:  DM2 P:   SSI q4H  NEUROLOGIC A:  Acute syncope, s/p long workup . Seizure activity potentially.   -  Ocular "bobbing" seen 06/12/14. Neuro suspecting brain stem lesion    P:   MRI and EEG pending, Neuro following Continue propofol gtt for now (CK high - recheck and monitor to avoid Propofol infusion syndrome) Fentanyl prn RASS goal 0 Home baclofen   [facility] ppx: Famotidine DVT ppx: back on home eliquis from 06/12/14   Family updated:  06/10/14:  Discussed current status with wife Jana Half on admission. Her cell is [phone] and home is [phone] 06/11/14 amd 06/12/14: Wife again updated at bedside   TODAY'S SUMMARY:  73 y/o male with syncope and possibly status epilepticus with pulmonary hypertension and known Afib.  Intubated for airway protection.  Plan full vent support, neurology work up for seizure/syncope.     The patient is critically ill with multiple organ systems failure and requires high complexity decision making for assessment and support, frequent evaluation and titration of therapies, application of advanced monitoring technologies and extensive interpretation of multiple databases.   Critical Care Time devoted to patient care services described in this note is  35  Minutes.  Dr. Brand Males, M.D., [facility].C.P Pulmonary and Critical Care Medicine Staff Physician [place] [facility] Pager: [phone], If no answer or between  15:00h - 7:00h: call [phone]  06/12/2014 9:10 AM

## 2014-06-12 NOTE — Progress Notes (Signed)
Subjective: No further seizure activity noted.  Remains intubated.  Scheduled for MRI of the brain today.    Objective: Current vital signs: BP 150/85  Pulse 91  Temp(Src) 98.7 F (37.1 C) (Oral)  Resp 14  Ht 5\' 11"  (1.803 m)  Wt 103.1 kg (227 lb 4.7 oz)  BMI 31.72 kg/m2  SpO2 96% Vital signs in last 24 hours: Temp:  [98.6 F (37 C)-99.5 F (37.5 C)] 98.7 F (37.1 C) (09/27 0750) Pulse Rate:  [34-91] 91 (09/27 1100) Resp:  [14] 14 (09/27 0825) BP: (81-166)/(45-93) 150/85 mmHg (09/27 1100) SpO2:  [93 %-99 %] 96 % (09/27 1100) FiO2 (%):  [40 %] 40 % (09/27 0840) Weight:  [103.1 kg (227 lb 4.7 oz)] 103.1 kg (227 lb 4.7 oz) (09/27 0500)  Intake/Output from previous day: 09/26 0701 - 09/27 0700 In: 2523.8 [I.V.:1609.3; NG/GT:754.5; IV Piggyback:160] Out: 1180 [Urine:1180] Intake/Output this shift: Total I/O In: 719.2 [I.V.:122.2; NG/GT:435; IV Piggyback:162] Out: 85 [Urine:85] Nutritional status:    Neurologic Exam: Mental Status: Turns head to verbal stimuli.  Follows simple commands bilaterally.  Intubated.   Cranial Nerves: II: patient does not respond confrontation bilaterally, pupils right 3 mm, left 3 mm,and reactive bilaterally III,IV,VI: Ocular bobbing noted persistently even as patient follows commands V,VII: corneal reflex present bilaterally  VIII: grossly intact IX,X: intubated, XI: trapezius strength unable to test bilaterally XII: tongue strength unable to test Motor: Minimal strength noted throughout Sensory: Respond to light stimuli bilaterally Deep Tendon Reflexes:  2+ throughout.   Lab Results: Basic Metabolic Panel:  Recent Labs Lab 06/10/14 2036 06/10/14 2054 06/11/14 0340 06/12/14 0744  NA 140 139 140  --   K 3.2* 3.1* 5.1  --   CL 84* 87* 92*  --   CO2 30  --  26  --   GLUCOSE 169* 178* 118*  --   BUN 24* 25* 21  --   CREATININE 1.15 1.20 1.02  --   CALCIUM 6.1*  --  5.7*  --   MG  --   --   --  0.6*  PHOS  --   --   --  3.6     Liver Function Tests:  Recent Labs Lab 06/10/14 2036  AST 34  ALT 20  ALKPHOS 109  BILITOT 1.1  PROT 7.1  ALBUMIN 3.6    Recent Labs Lab 06/12/14 0744  LIPASE 13   No results found for this basename: AMMONIA,  in the last 168 hours  CBC:  Recent Labs Lab 06/10/14 2036 06/10/14 2054 06/11/14 0340 06/12/14 0744  WBC 15.8*  --  14.2* 9.8  NEUTROABS 14.4*  --   --  6.8  HGB 19.9* 22.4* 17.5* 15.5  HCT 57.0* 66.0* 50.6 47.2  MCV 86.1  --  84.9 88.1  PLT 202  --  127* 117*    Cardiac Enzymes:  Recent Labs Lab 06/10/14 2036 06/11/14 1100 06/11/14 1821 06/12/14 0154 06/12/14 0744  CKTOTAL  --   --   --   --  369*  CKMB  --   --   --   --  1.1  TROPONINI <0.30 <0.30 <0.30 <0.30  --     Lipid Panel: No results found for this basename: CHOL, TRIG, HDL, CHOLHDL, VLDL, LDLCALC,  in the last 168 hours  CBG:  Recent Labs Lab 06/11/14 1546 06/11/14 1935 06/11/14 2341 06/12/14 0450 06/12/14 0749  GLUCAP 82 106* 111* 107* 102*    Microbiology: Results for orders placed during  the hospital encounter of 06/10/14  MRSA PCR SCREENING     Status: None   Collection Time    06/11/14 12:55 AM      Result Value Ref Range Status   MRSA by PCR NEGATIVE  NEGATIVE Final   Comment:            The GeneXpert MRSA Assay (FDA     approved for NASAL specimens     only), is one component of a     comprehensive MRSA colonization     surveillance program. It is not     intended to diagnose MRSA     infection nor to guide or     monitor treatment for     MRSA infections.    Coagulation Studies:  Recent Labs  06/10/14 2036  LABPROT 17.7*  INR 1.46    Imaging: Ct Head Wo Contrast  06/10/2014   CLINICAL DATA:  Seizures, fall, head injury  EXAM: CT HEAD WITHOUT CONTRAST  TECHNIQUE: Contiguous axial images were obtained from the base of the skull through the vertex without intravenous contrast.  COMPARISON:  05/24/2014  FINDINGS: There is no evidence of mass  effect, midline shift, or extra-axial fluid collections. There is no evidence of a space-occupying lesion or intracranial hemorrhage. There is no evidence of a cortical-based area of acute infarction. There is generalized cerebral atrophy. There is periventricular white matter low attenuation likely secondary to microangiopathy.  The ventricles and sulci are appropriate for the patient's age. The basal cisterns are patent.  Visualized portions of the orbits are unremarkable. The visualized portions of the paranasal sinuses and mastoid air cells are unremarkable. Cerebrovascular atherosclerotic calcifications are noted.  The osseous structures are unremarkable.  IMPRESSION: No acute intracranial pathology.   Electronically Signed   By: Kathreen Devoid   On: 06/10/2014 22:49   Ct Angio Chest Pe W/cm &/or Wo Cm  06/10/2014   CLINICAL DATA:  Fall.  Shortness of breath.  Hypoxia.  EXAM: CT ANGIOGRAPHY CHEST, ABDOMEN AND PELVIS  TECHNIQUE: Multidetector CT imaging through the chest, abdomen and pelvis was performed using the standard protocol during bolus administration of intravenous contrast. Multiplanar reconstructed images and MIPs were obtained and reviewed to evaluate the vascular anatomy.  CONTRAST:  13mL OMNIPAQUE IOHEXOL 350 MG/ML SOLN  COMPARISON:  CT abdomen and pelvis 11/06/2012  FINDINGS: CTA CHEST FINDINGS  Unenhanced images demonstrate endotracheal tube in place. Coronary artery calcifications. Scattered aortic calcification. No evidence of intramural hematoma.  Images obtained during arterial phase of contrast bolus demonstrate normal caliber thoracic aorta. No evidence of aortic dissection. Great vessel origins are patent. Visualized central pulmonary arteries are patent. No evidence of significant pulmonary embolus.  No significant lymphadenopathy in the chest. Esophagus is mostly decompressed. Diffusely thick-walled esophagus is suggested, possibly due to reflux disease. No pleural effusions.  Peripheral fibrosis in the lungs with diffuse emphysematous changes. No pneumothorax.  Review of the MIP images confirms the above findings.  CTA ABDOMEN AND PELVIS FINDINGS  Abdominal CT angiographic images demonstrate calcific and noncalcific changes throughout the abdominal aorta and branch vessels with mural thrombus present. No critical stenosis is demonstrated. No evidence of aortic dissection or aneurysm. The celiac axis, superior mesenteric artery, bilateral single renal arteries, inferior mesenteric artery, and the bilateral iliac, external iliac, internal iliac, and common femoral arteries appear patent.  Enlarged lateral segment of the left lobe of liver suggestive cirrhosis. Small low-attenuation lesion in the left lobe of the liver is stable since previous study  and probably represents a cyst or hemangioma. Gallbladder is mildly distended with sludge. No discrete stones. No bile duct dilatation. Diffuse fatty atrophy of the pancreas. Spleen size is normal. Left adrenal gland nodule measures 15 mm without change since previous study. Cyst in the upper pole of the right kidney. Renal nephrogram sub are symmetrical. No hydronephrosis. Inferior vena cava and retroperitoneal lymph nodes are unremarkable. Stomach, small bowel, and colon appear normal for degree of distention. No free air or free fluid in the abdomen.  Pelvis: Diverticulosis of sigmoid colon without evidence of diverticulitis. Appendix is normal. Visualization of the low pelvis is limited due to streak artifact arising from a left hip arthroplasty. No free or loculated pelvic fluid collections identified. Foley catheter in the bladder. No pelvic mass or lymphadenopathy.  Bones: Diffuse degenerative changes throughout the spine. No destructive bone lesions appreciated.  Review of the MIP images confirms the above findings.  IMPRESSION: No evidence of thoracic or abdominal aortic aneurysm or dissection. Diffuse atherosclerotic changes. No  evidence of central pulmonary embolus. 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 Unchanged appearance of hepatic and renal cysts and left adrenal gland nodule. Mild gallbladder distention with sludge. Diverticulosis without inflammatory changes.   Electronically Signed   By: Lucienne Capers M.D.   On: 06/10/2014 23:05   Dg Chest Port 1 View  06/12/2014   CLINICAL DATA:  Intubation  EXAM: PORTABLE CHEST - 1 VIEW  COMPARISON:  Portable exam 0511 hr compared to 06/11/2014  FINDINGS: Tip of endotracheal tube projects 3.0 cm above carina.  Nasogastric tube extends into stomach.  Enlargement of cardiac silhouette with pulmonary vascular congestion.  Mediastinal contour stable.  Atherosclerotic calcification aortic arch.  Atelectasis versus consolidation in LEFT lower lobe unchanged.  Minimal RIGHT base atelectasis.  Remaining lungs demonstrate coarsened markings without definite infiltrate.  Probable small LEFT pleural effusion.  No pneumothorax.  IMPRESSION: Enlargement of cardiac silhouette with pulmonary vascular congestion.  Persistent atelectasis versus consolidation in LEFT lower lobe with suspect small LEFT pleural effusion.   Electronically Signed   By: Lavonia Dana M.D.   On: 06/12/2014 07:33   Dg Chest Port 1 View  06/11/2014   CLINICAL DATA:  Endotracheal tube placement.  EXAM: PORTABLE CHEST - 1 VIEW  COMPARISON:  06/10/2014  FINDINGS: Patient slightly rotated to the left. Endotracheal tube has been pulled back has tip is now approximately 4.1 cm above the carina. Nasogastric tube courses into the region of the stomach and off the inferior portion of the film. Lungs are somewhat hypoinflated with minimal left basilar/ retrocardiac opacification likely combination of atelectasis with small amount of pleural fluid. Cardiac stone silhouette and remainder the exam is unchanged.  IMPRESSION: Hypoinflation with mild left basilar opacification likely small amount of pleural fluid with mild  atelectasis. Cannot completely exclude infection.  Endotracheal tube pulled back slightly with tip 4.1 cm above the carina. Nasogastric tube courses into the region of the stomach as tip is not visualized.   Electronically Signed   By: Marin Olp M.D.   On: 06/11/2014 07:13   Dg Chest Portable 1 View  06/10/2014   CLINICAL DATA:  Endotracheal tube placement.  EXAM: PORTABLE CHEST - 1 VIEW  COMPARISON:  06/10/2014  FINDINGS: Endotracheal tube is been placed. Tip measures 2.1 cm above the carinal. Shallow inspiration. Heart size and pulmonary vascularity are normal for technique. Interstitial changes in the lung bases suggesting fibrosis. No focal airspace consolidation. No blunting of costophrenic angles. No pneumothorax.  Calcified and tortuous aorta.  IMPRESSION: Endotracheal tube tip measures 2.1 cm above the carina.   Electronically Signed   By: Lucienne Capers M.D.   On: 06/10/2014 22:16   Dg Chest Portable 1 View  06/10/2014   CLINICAL DATA:  Tachycardia. History of hypertension, AFib, COPD, diabetes.  EXAM: PORTABLE CHEST - 1 VIEW  COMPARISON:  05/24/2014  FINDINGS: Borderline heart size. Mild pulmonary vascular prominence. Diffuse interstitial pattern similar previous study, probably representing fibrosis. Calcified granuloma in the left mid lung. No focal airspace disease or consolidation. No blunting of costophrenic angles. No pneumothorax. Calcified aorta.  IMPRESSION: Mild diffuse interstitial pattern in the lungs likely representing fibrosis. No focal consolidation. Borderline heart size.   Electronically Signed   By: Lucienne Capers M.D.   On: 06/10/2014 21:02   Ct Cta Abd/pel W/cm &/or W/o Cm  06/10/2014   CLINICAL DATA:  Fall.  Shortness of breath.  Hypoxia.  EXAM: CT ANGIOGRAPHY CHEST, ABDOMEN AND PELVIS  TECHNIQUE: Multidetector CT imaging through the chest, abdomen and pelvis was performed using the standard protocol during bolus administration of intravenous contrast. Multiplanar  reconstructed images and MIPs were obtained and reviewed to evaluate the vascular anatomy.  CONTRAST:  135mL OMNIPAQUE IOHEXOL 350 MG/ML SOLN  COMPARISON:  CT abdomen and pelvis 11/06/2012  FINDINGS: CTA CHEST FINDINGS  Unenhanced images demonstrate endotracheal tube in place. Coronary artery calcifications. Scattered aortic calcification. No evidence of intramural hematoma.  Images obtained during arterial phase of contrast bolus demonstrate normal caliber thoracic aorta. No evidence of aortic dissection. Great vessel origins are patent. Visualized central pulmonary arteries are patent. No evidence of significant pulmonary embolus.  No significant lymphadenopathy in the chest. Esophagus is mostly decompressed. Diffusely thick-walled esophagus is suggested, possibly due to reflux disease. No pleural effusions. Peripheral fibrosis in the lungs with diffuse emphysematous changes. No pneumothorax.  Review of the MIP images confirms the above findings.  CTA ABDOMEN AND PELVIS FINDINGS  Abdominal CT angiographic images demonstrate calcific and noncalcific changes throughout the abdominal aorta and branch vessels with mural thrombus present. No critical stenosis is demonstrated. No evidence of aortic dissection or aneurysm. The celiac axis, superior mesenteric artery, bilateral single renal arteries, inferior mesenteric artery, and the bilateral iliac, external iliac, internal iliac, and common femoral arteries appear patent.  Enlarged lateral segment of the left lobe of liver suggestive cirrhosis. Small low-attenuation lesion in the left lobe of the liver is stable since previous study and probably represents a cyst or hemangioma. Gallbladder is mildly distended with sludge. No discrete stones. No bile duct dilatation. Diffuse fatty atrophy of the pancreas. Spleen size is normal. Left adrenal gland nodule measures 15 mm without change since previous study. Cyst in the upper pole of the right kidney. Renal nephrogram sub  are symmetrical. No hydronephrosis. Inferior vena cava and retroperitoneal lymph nodes are unremarkable. Stomach, small bowel, and colon appear normal for degree of distention. No free air or free fluid in the abdomen.  Pelvis: Diverticulosis of sigmoid colon without evidence of diverticulitis. Appendix is normal. Visualization of the low pelvis is limited due to streak artifact arising from a left hip arthroplasty. No free or loculated pelvic fluid collections identified. Foley catheter in the bladder. No pelvic mass or lymphadenopathy.  Bones: Diffuse degenerative changes throughout the spine. No destructive bone lesions appreciated.  Review of the MIP images confirms the above findings.  IMPRESSION: No evidence of thoracic or abdominal aortic aneurysm or dissection. Diffuse atherosclerotic changes. No evidence of central pulmonary  embolus. 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 Unchanged appearance of hepatic and renal cysts and left adrenal gland nodule. Mild gallbladder distention with sludge. Diverticulosis without inflammatory changes.   Electronically Signed   By: Lucienne Capers M.D.   On: 06/10/2014 23:05    Medications:  I have reviewed the patient's current medications. Scheduled: . antiseptic oral rinse  7 mL Mouth Rinse QID  . apixaban  5 mg Oral BID  . aspirin  81 mg Oral Daily  . baclofen  10 mg Oral 2 times per day  . baclofen  20 mg Oral 2 times per day  . chlorhexidine  15 mL Mouth Rinse BID  . cilostazol  50 mg Oral BID  . diltiazem  120 mg Oral Daily  . famotidine (PEPCID) IV  20 mg Intravenous Q12H  . feeding supplement (PRO-STAT SUGAR FREE 64)  30 mL Per Tube TID WC  . feeding supplement (VITAL HIGH PROTEIN)  1,000 mL Per Tube Q24H  . fentaNYL  50 mcg Intravenous Once  . insulin aspart  0-15 Units Subcutaneous 6 times per day  . ipratropium-albuterol  3 mL Nebulization Q6H  . magnesium sulfate 1 - 4 g bolus IVPB  6 g Intravenous Once  . metoprolol  25 mg  Oral QPM  . simvastatin  20 mg Oral QPM    Assessment/Plan: No further seizures noted.  Abnormal eye movements may suggest brain stem injury.  Head CT from admission reviewed and shows no acute changes.  EEG consistent with sedation.  Patient with metabolic issues that may be referable to presentation,  Recommendations: 1.  Will follow up MRI of brain today and make further recommendations at this time. 2.  Agree with addressing metabolic issues.       LOS: 2 days   Alexis Goodell, MD [facility] [phone] 06/12/2014  11:22 AM

## 2014-06-12 NOTE — Progress Notes (Signed)
Patient transported to MRI from 12:00- 1405 patient tolerated well patient SATS remained 100% during transport and the MRI procedure, HR remained 66-80 BPM, RR 14-18 BPM BP 153/71, will continue to monitor patient.

## 2014-06-12 NOTE — Progress Notes (Signed)
CRITICAL VALUE ALERT  Critical value received:  mag level 0.6  Date of notification: 06/12/14  Time of notification: 0830  Critical value read back:yes  Nurse who received alert:  R Blythe Veach RN  MD notified (1st page):  Rawmaswamy  Time of first page:  0830  MD notified (2nd page):  Time of second page:  Responding MD:  Heloise Ochoa Time MD responded: [phone]

## 2014-06-13 ENCOUNTER — Inpatient Hospital Stay (HOSPITAL_COMMUNITY): Payer: Medicare Other

## 2014-06-13 LAB — GLUCOSE, CAPILLARY
GLUCOSE-CAPILLARY: 93 mg/dL (ref 70–99)
GLUCOSE-CAPILLARY: 93 mg/dL (ref 70–99)
Glucose-Capillary: 142 mg/dL — ABNORMAL HIGH (ref 70–99)
Glucose-Capillary: 138 mg/dL — ABNORMAL HIGH (ref 70–99)
Glucose-Capillary: 111 mg/dL — ABNORMAL HIGH (ref 70–99)

## 2014-06-13 LAB — CK TOTAL AND CKMB (NOT AT ARMC)
CK, MB: <1 ng/mL (ref 0.3–4.0)
Total CK: 515 U/L — ABNORMAL HIGH (ref 7–232)

## 2014-06-13 LAB — CBC WITH DIFFERENTIAL/PLATELET
Basophils Absolute: 0 10*3/uL (ref 0.0–0.1)
Basophils Relative: 0 % (ref 0–1)
EOS PCT: 1 % (ref 0–5)
Eosinophils Absolute: 0.1 10*3/uL (ref 0.0–0.7)
HEMATOCRIT: 50.5 % (ref 39.0–52.0)
Hemoglobin: 16.8 g/dL (ref 13.0–17.0)
LYMPHS ABS: 1.1 10*3/uL (ref 0.7–4.0)
Lymphocytes Relative: 12 % (ref 12–46)
MCH: 29.3 pg (ref 26.0–34.0)
MCHC: 33.3 g/dL (ref 30.0–36.0)
MCV: 88.1 fL (ref 78.0–100.0)
Monocytes Absolute: 1 10*3/uL (ref 0.1–1.0)
Monocytes Relative: 11 % (ref 3–12)
NEUTROS ABS: 7.1 10*3/uL (ref 1.7–7.7)
Neutrophils Relative %: 76 % (ref 43–77)
Platelets: DECREASED 10*3/uL (ref 150–400)
RBC: 5.73 MIL/uL (ref 4.22–5.81)
RDW: 17.1 % — ABNORMAL HIGH (ref 11.5–15.5)
Smear Review: DECREASED
WBC: 9.3 10*3/uL (ref 4.0–10.5)

## 2014-06-13 LAB — BASIC METABOLIC PANEL
Anion gap: 12 (ref 5–15)
BUN: 13 mg/dL (ref 6–23)
CALCIUM: 6 mg/dL — AB (ref 8.4–10.5)
CO2: 33 mEq/L — ABNORMAL HIGH (ref 19–32)
Chloride: 94 mEq/L — ABNORMAL LOW (ref 96–112)
Creatinine, Ser: 0.64 mg/dL (ref 0.50–1.35)
GFR calc Af Amer: >90 mL/min (ref >90)
Glucose, Bld: 104 mg/dL — ABNORMAL HIGH (ref 70–99)
POTASSIUM: 3.2 meq/L — AB (ref 3.7–5.3)
Sodium: 139 mEq/L (ref 137–147)

## 2014-06-13 LAB — PROCALCITONIN: PROCALCITONIN: 0.16 ng/mL

## 2014-06-13 LAB — PHOSPHORUS: Phosphorus: 3.2 mg/dL (ref 2.3–4.6)

## 2014-06-13 LAB — MAGNESIUM: Magnesium: 1.7 mg/dL (ref 1.5–2.5)

## 2014-06-13 MED ORDER — SODIUM CHLORIDE 0.9 % IV SOLN
2.0000 g | Freq: Once | INTRAVENOUS | Status: AC
  Administered 2014-06-13: 2 g via INTRAVENOUS
  Filled 2014-06-13: qty 20

## 2014-06-13 MED ORDER — POTASSIUM CHLORIDE 20 MEQ/15ML (10%) PO LIQD
40.0000 meq | Freq: Four times a day (QID) | ORAL | Status: AC
  Administered 2014-06-13 (×2): 40 meq
  Filled 2014-06-13 (×4): qty 30

## 2014-06-13 MED ORDER — VITAL AF 1.2 CAL PO LIQD
1000.0000 mL | ORAL | Status: DC
  Administered 2014-06-13 – 2014-06-16 (×4): 1000 mL
  Filled 2014-06-13 (×6): qty 1000

## 2014-06-13 MED ORDER — DILTIAZEM HCL 30 MG PO TABS
30.0000 mg | ORAL_TABLET | Freq: Four times a day (QID) | ORAL | Status: DC
  Administered 2014-06-14 – 2014-06-16 (×11): 30 mg via ORAL
  Filled 2014-06-13 (×20): qty 1

## 2014-06-13 MED ORDER — FUROSEMIDE 10 MG/ML IJ SOLN
40.0000 mg | Freq: Once | INTRAMUSCULAR | Status: AC
  Administered 2014-06-13: 40 mg via INTRAVENOUS
  Filled 2014-06-13: qty 4

## 2014-06-13 MED ORDER — PRO-STAT SUGAR FREE PO LIQD
60.0000 mL | Freq: Two times a day (BID) | ORAL | Status: DC
Start: 2014-06-13 — End: 2014-06-17
  Administered 2014-06-13 – 2014-06-17 (×8): 60 mL
  Filled 2014-06-13 (×9): qty 60

## 2014-06-13 MED ORDER — MAGNESIUM SULFATE 40 MG/ML IJ SOLN
2.0000 g | Freq: Once | INTRAMUSCULAR | Status: AC
  Administered 2014-06-13: 2 g via INTRAVENOUS
  Filled 2014-06-13: qty 50

## 2014-06-13 NOTE — Progress Notes (Signed)
CRITICAL VALUE ALERT  Critical value received: Ca 6.0  Date of notification: 06/13/14  Time of notification: 5397  Critical value read back:yes  Nurse who received alert:  P. Burton Apley RN  MD notified (1st page): Dr. Elsworth Soho  Time of first page:  1125  MD notified (2nd page):  Time of second page:  Responding MD:  Dr. Elsworth Soho  Time MD responded:  1125

## 2014-06-13 NOTE — Procedures (Signed)
ELECTROENCEPHALOGRAM REPORT  Date of Study: 06/13/2014  Patient's Name: Chad Morse MRN: [id number] Date of Birth: [birth year]  Referring Provider: Dr. Alexis Goodell  Clinical History: This is a 73 year old man with new onset seizures. Repeat EEG off Propofol. Patient is inubated and sedated on Fentanyl.   Medications: .  apixaban   5 mg  Oral  BID   .  aspirin   81 mg  Oral  Daily   .  baclofen   10 mg  Oral  2 times per day   .  cilostazol   50 mg  Oral  BID   .  diltiazem   120 mg  Oral  Daily   .  famotidine (PEPCID) IV   20 mg  Intravenous  Q12H   .  fentaNYL   50 mcg  Intravenous  Once   .  furosemide   40 mg  Intravenous  Once   .  insulin aspart   0-15 Units  Subcutaneous  6 times per day   .  ipratropium-albuterol   3 mL  Nebulization  Q6H   .  levETIRAcetam   500 mg  Intravenous  Q12H   .  metoprolol   25 mg  Oral  QPM   .  simvastatin    Technical Summary: A multichannel digital EEG recording measured by the international 10-20 system with electrodes applied with paste and impedances below 5000 ohms performed as portable with EKG monitoring in an intubated and sedated patient.  Hyperventilation and photic stimulation were not performed.  The digital EEG was referentially recorded, reformatted, and digitally filtered in a variety of bipolar and referential montages for optimal display.   Description: The patient is intubated and sedated on Fentanyl during the recording. He follows commands intermittently.  There is a poorly sustained 7 Hz posterior dominant rhythm that poorly attenuates to eye opening and eye closure.  There is a moderate amount of diffuse medium voltage 4-5 Hz theta and 2-3 Hz delta slowing of the background.  Poorly formed vertex waves are seen.  With noxious stimulation, there is an increase in faster frequencies and muscle artifact.  Patient is noted to have right upward gaze during the study, with no epileptiform correlate seen.  There were no  epileptiform discharges or electrographic seizures noted.  EKG lead showed irregular rhythm.  Impression: This sedated EEG is abnormal due to moderate diffuse slowing of the background.  Clinical Correlation of the above findings indicates diffuse cerebral dysfunction that is non-specific in etiology and can be seen with hypoxic/ischemic injury, toxic/metabolic encephalopathies, or medication effect from Fentanyl.  The absence of epileptiform discharges does not rule out a clinical diagnosis of epilepsy.  There were no electrographic seizures seen in this study.   Ellouise Newer, M.D.

## 2014-06-13 NOTE — Progress Notes (Signed)
Subjective: Patient remains intubated and sedated.  No further seizure activity noted.  On Keppra.  Objective: Current vital signs: BP 115/73  Pulse 97  Temp(Src) 99 F (37.2 C) (Oral)  Resp 14  Ht 5\' 11"  (1.803 m)  Wt 101.9 kg (224 lb 10.4 oz)  BMI 31.35 kg/m2  SpO2 97% Vital signs in last 24 hours: Temp:  [98.6 F (37 C)-100.5 F (38.1 C)] 99 F (37.2 C) (09/28 0700) Pulse Rate:  [60-127] 97 (09/28 0800) Resp:  [14-15] 14 (09/27 1545) BP: (90-151)/(36-85) 115/73 mmHg (09/28 0800) SpO2:  [93 %-100 %] 97 % (09/28 0829) FiO2 (%):  [40 %] 40 % (09/28 0834) Weight:  [101.9 kg (224 lb 10.4 oz)] 101.9 kg (224 lb 10.4 oz) (09/28 0338)  Intake/Output from previous day: 09/27 0701 - 09/28 0700 In: 2604.1 [I.V.:1007.1; NG/GT:1185; IV Piggyback:412] Out: 9563 [Urine:1355] Intake/Output this shift:   Nutritional status:    Neurologic Exam: Mental Status:  Turns head to verbal stimuli. Follows simple commands bilaterally. Intubated.  Cranial Nerves:  II: patient does not respond confrontation bilaterally, pupils right 3 mm, left 3 mm,and reactive bilaterally  III,IV,VI: Intact oculocephalic response bilaterally.  No further ocular bobbing noted. V,VII: corneal reflex present bilaterally.  Patient actively resists my attempts to open her eyes.    VIII: grossly intact  IX,X: intubated, XI: trapezius strength unable to test bilaterally  XII: tongue strength unable to test  Motor:  Minimal strength noted throughout  Sensory:  Respond to light stimuli bilaterally  Deep Tendon Reflexes:  2+ throughout.      Lab Results: Basic Metabolic Panel:  Recent Labs Lab 06/10/14 2036 06/10/14 2054 06/11/14 0340 06/12/14 0744 06/13/14 0340  NA 140 139 140  --   --   K 3.2* 3.1* 5.1  --   --   CL 84* 87* 92*  --   --   CO2 30  --  26  --   --   GLUCOSE 169* 178* 118*  --   --   BUN 24* 25* 21  --   --   CREATININE 1.15 1.20 1.02  --   --   CALCIUM 6.1*  --  5.7*  --   --   MG   --   --   --  0.6* 1.7  PHOS  --   --   --  3.6 3.2    Liver Function Tests:  Recent Labs Lab 06/10/14 2036  AST 34  ALT 20  ALKPHOS 109  BILITOT 1.1  PROT 7.1  ALBUMIN 3.6    Recent Labs Lab 06/12/14 0744  LIPASE 13   No results found for this basename: AMMONIA,  in the last 168 hours  CBC:  Recent Labs Lab 06/10/14 2036 06/10/14 2054 06/11/14 0340 06/12/14 0744  WBC 15.8*  --  14.2* 9.8  NEUTROABS 14.4*  --   --  6.8  HGB 19.9* 22.4* 17.5* 15.5  HCT 57.0* 66.0* 50.6 47.2  MCV 86.1  --  84.9 88.1  PLT 202  --  127* 117*    Cardiac Enzymes:  Recent Labs Lab 06/10/14 2036 06/11/14 1100 06/11/14 1821 06/12/14 0154 06/12/14 0744 06/13/14 0340  CKTOTAL  --   --   --   --  369* 515*  CKMB  --   --   --   --  1.1 <1.0  TROPONINI <0.30 <0.30 <0.30 <0.30  --   --     Lipid Panel: No results found for this  basename: CHOL, TRIG, HDL, CHOLHDL, VLDL, LDLCALC,  in the last 168 hours  CBG:  Recent Labs Lab 06/12/14 1657 06/12/14 2002 06/12/14 2308 06/13/14 0331 06/13/14 0750  GLUCAP 121* 109* 89 93 93    Microbiology: Results for orders placed during the hospital encounter of 06/10/14  CULTURE, BLOOD (ROUTINE X 2)     Status: None   Collection Time    06/10/14  9:30 PM      Result Value Ref Range Status   Specimen Description BLOOD LEFT HAND   Final   Special Requests BOTTLES DRAWN AEROBIC AND ANAEROBIC 4CC   Final   Culture  Setup Time     Final   Value: 06/11/2014 04:35     Performed at [facility]   Culture     Final   Value:        BLOOD CULTURE RECEIVED NO GROWTH TO DATE CULTURE WILL BE HELD FOR 5 DAYS BEFORE ISSUING A FINAL NEGATIVE REPORT     Performed at [facility]   Report Status PENDING   Incomplete  CULTURE, BLOOD (ROUTINE X 2)     Status: None   Collection Time    06/10/14  9:38 PM      Result Value Ref Range Status   Specimen Description BLOOD LEFT FOREARM   Final   Special Requests BOTTLES DRAWN AEROBIC AND  ANAEROBIC 5CC   Final   Culture  Setup Time     Final   Value: 06/11/2014 04:34     Performed at [facility]   Culture     Final   Value:        BLOOD CULTURE RECEIVED NO GROWTH TO DATE CULTURE WILL BE HELD FOR 5 DAYS BEFORE ISSUING A FINAL NEGATIVE REPORT     Performed at [facility]   Report Status PENDING   Incomplete  MRSA PCR SCREENING     Status: None   Collection Time    06/11/14 12:55 AM      Result Value Ref Range Status   MRSA by PCR NEGATIVE  NEGATIVE Final   Comment:            The GeneXpert MRSA Assay (FDA     approved for NASAL specimens     only), is one component of a     comprehensive MRSA colonization     surveillance program. It is not     intended to diagnose MRSA     infection nor to guide or     monitor treatment for     MRSA infections.    Coagulation Studies:  Recent Labs  06/10/14 2036  LABPROT 17.7*  INR 1.46    Imaging: Mr Jeri Cos RK Contrast  06/12/2014   CLINICAL DATA:  New onset seizures.  EXAM: MRI HEAD WITHOUT AND WITH CONTRAST  TECHNIQUE: Multiplanar, multiecho pulse sequences of the brain and surrounding structures were obtained without and with intravenous contrast.  CONTRAST:  41mL MULTIHANCE GADOBENATE DIMEGLUMINE 529 MG/ML IV SOLN  COMPARISON:  Head CT 06/10/2014  FINDINGS: Images are mildly to moderately degraded by motion artifact.  Detailed evaluation of the hippocampi is precluded by motion artifact. Hippocampi are grossly symmetric in size. There is no evidence of acute infarct, intracranial hemorrhage, mass, midline shift, or extra-axial fluid collection. 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 Remote right basal ganglia lacunar infarct is also noted. Scattered, small foci of T2 hyperintensity in the subcortical and deep cerebral white matter bilaterally  are nonspecific but compatible with mild chronic small vessel ischemic disease. There is mild to moderate generalized cerebral atrophy. There is no abnormal  enhancement.  Orbits are unremarkable. There is minimal bilateral ethmoid air cell mucosal thickening. There are small bilateral mastoid effusions. Distal left vertebral artery flow void is not well seen and may be stenotic or occluded proximally. Other major intracranial vascular flow voids are preserved.  IMPRESSION: 1. No acute intracranial abnormality or mass. 2. Mild chronic small vessel ischemic disease and cerebral atrophy. 3. 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 4. Stenotic versus occluded distal left vertebral artery.   Electronically Signed   By: Logan Bores   On: 06/12/2014 14:06   Dg Chest Port 1 View  06/13/2014   CLINICAL DATA:  Check ETT position.  EXAM: PORTABLE CHEST - 1 VIEW  COMPARISON:  06/12/2014  FINDINGS: ET tube and NG tube are in stable position. Continued left lower lobe atelectasis or infiltrate with small left effusion. Increasing right basilar atelectasis or infiltrate. Heart is upper limits normal in size.  IMPRESSION: Stable left basilar atelectasis or infiltrate and small effusion.  Increasing right basilar atelectasis or infiltrate   Electronically Signed   By: Rolm Baptise M.D.   On: 06/13/2014 07:14   Dg Chest Port 1 View  06/12/2014   CLINICAL DATA:  Intubation  EXAM: PORTABLE CHEST - 1 VIEW  COMPARISON:  Portable exam 0511 hr compared to 06/11/2014  FINDINGS: Tip of endotracheal tube projects 3.0 cm above carina.  Nasogastric tube extends into stomach.  Enlargement of cardiac silhouette with pulmonary vascular congestion.  Mediastinal contour stable.  Atherosclerotic calcification aortic arch.  Atelectasis versus consolidation in LEFT lower lobe unchanged.  Minimal RIGHT base atelectasis.  Remaining lungs demonstrate coarsened markings without definite infiltrate.  Probable small LEFT pleural effusion.  No pneumothorax.  IMPRESSION: Enlargement of cardiac silhouette with pulmonary vascular congestion.  Persistent atelectasis versus consolidation in LEFT  lower lobe with suspect small LEFT pleural effusion.   Electronically Signed   By: Lavonia Dana M.D.   On: 06/12/2014 07:33    Medications:  I have reviewed the patient's current medications. Scheduled: . antiseptic oral rinse  7 mL Mouth Rinse QID  . apixaban  5 mg Oral BID  . aspirin  81 mg Oral Daily  . baclofen  10 mg Oral 2 times per day  . chlorhexidine  15 mL Mouth Rinse BID  . cilostazol  50 mg Oral BID  . diltiazem  120 mg Oral Daily  . famotidine (PEPCID) IV  20 mg Intravenous Q12H  . feeding supplement (PRO-STAT SUGAR FREE 64)  30 mL Per Tube TID WC  . feeding supplement (VITAL HIGH PROTEIN)  1,000 mL Per Tube Q24H  . fentaNYL  50 mcg Intravenous Once  . furosemide  40 mg Intravenous Once  . insulin aspart  0-15 Units Subcutaneous 6 times per day  . ipratropium-albuterol  3 mL Nebulization Q6H  . levETIRAcetam  500 mg Intravenous Q12H  . metoprolol  25 mg Oral QPM  . simvastatin  20 mg Oral QPM    Assessment/Plan: Patient intubated and sedated but following some commands.  MRI of the brain reviewed and shows no acute changes.  Some chronic ischemic disease is noted.  Magnesium low yesterday at 0.6.  Unclear if this may have been present at presentation and led to occurrence of seizure activity.  This has been corrected.  Patient on Keppra.  Seems to be tolerating well.  Recommendations: 1.  Repeat EEG today. Would decrease sedation if possible for EEG. 2.  Continue Keppra at current dose.    LOS: 3 days   Alexis Goodell, MD [facility] [phone] 06/13/2014  9:17 AM

## 2014-06-13 NOTE — Care Management Note (Addendum)
    Page 1 of 1   06/21/2014     10:36:59 AM CARE MANAGEMENT NOTE 06/21/2014  Patient:  LAMOUNT, BANKSON   Account Number:  [id number]  Date Initiated:  06/13/2014  Documentation initiated by:  Elissa Hefty  Subjective/Objective Assessment:   adm w seizure-vent     Action/Plan:   lives w wife, pcp dr ed Luan Pulling   Anticipated DC Date:     Anticipated DC Plan:           Choice offered to / List presented to:             Status of service:   Medicare Important Message given?  YES (If response is "NO", the following Medicare IM given date fields will be blank) Date Medicare IM given:  06/20/2014 Medicare IM given by:  Elissa Hefty Date Additional Medicare IM given:  06/16/2014 Additional Medicare IM given by:  Elissa Hefty  Discharge Disposition:    Per UR Regulation:    If discussed at Long Length of Stay Meetings, dates discussed:   06/16/2014  06/21/2014    Comments:  06-21-14 1033 Jacqlyn Krauss, RN,BSN [phone] Per PT notes plan for CIR. Consult placed for inpatient rehab. Pt is agreeable to CIR with back up plan for SNF. CM will continue to  monitor to see if LTAC needs to be an option.

## 2014-06-13 NOTE — Progress Notes (Signed)
Dr. Lamonte Sakai paged concerning pt's temperature of 101F. Will continue to monitor for increase per order.

## 2014-06-13 NOTE — Progress Notes (Signed)
EEG completed at bedside, results pending. 

## 2014-06-13 NOTE — Progress Notes (Signed)
PULMONARY / CRITICAL CARE MEDICINE   Name: Chad Morse MRN: [id number] DOB: 09/06/1941    ADMISSION DATE:  06/10/2014 CONSULTATION DATE:  06/10/2014  REFERRING MD :  Rancour  CHIEF COMPLAINT:  Seizure  INITIAL PRESENTATION:  73 y/o male smoker with COPD and Afib who was admitted on 9/25 from the [facility] ED after he had fell at home when standing from a sitting position and struck his head.  He was briefly unresponsive afterwards so EMS was called.  He was initially conversant in the ED but then had seizure in the ED and was intubated for airway protection.  Further history could not be obtained due to intubation.  In speaking with the patient's wife, she states that he has been unwell since Labor Day, when he developed nausea and vomiting and was diagnosed with a UTI, for which he completed some antibiotics.  However, he has not been "quite right" with his GI tract since, complaining of decreased appetite, nausea, and abdominal pain.   He has chronic leg swelling and pain, which limits his home activities, so he is not very active but can walk.  He does not drive and does need some help with dressing himself.     EVENTS 9/25 CT head > No Acute Intracranial pathology 9/25 CT angio chest/ab/pelv > No evidence of thoracic or abdominal aortic aneurysm or dissection, no PE, diffuse emphysema and fibrosis of the lungs, mild gallbladder wall distention with sludge, diverticulosis without inflammatory changes 9/25 admission, seizure in ED, intubated 06/11/14: restless but purposeful on WUA off diprivan. Not on pressors. 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     SUBJECTIVE/OVERNIGHT/INTERVAL HX Calmer on WUA  low gr fever  VITAL SIGNS: Temp:  [98.6 F (37 C)-100.5 F (38.1 C)] 99 F (37.2 C) (09/28 0700) Pulse Rate:  [60-127] 97 (09/28 0800) Resp:  [14-15] 14 (09/27 1545) BP:  (90-166)/(36-93) 115/73 mmHg (09/28 0800) SpO2:  [93 %-100 %] 97 % (09/28 0829) FiO2 (%):  [40 %] 40 % (09/28 0834) Weight:  [101.9 kg (224 lb 10.4 oz)] 101.9 kg (224 lb 10.4 oz) (09/28 0338) HEMODYNAMICS:   VENTILATOR SETTINGS: Vent Mode:  [-] PRVC FiO2 (%):  [40 %] 40 % Set Rate:  [14 bmp] 14 bmp Vt Set:  [600 mL] 600 mL PEEP:  [5 cmH20] 5 cmH20 Plateau Pressure:  [13 cmH20-20 cmH20] 13 cmH20 INTAKE / OUTPUT:  Intake/Output Summary (Last 24 hours) at 06/13/14 0846 Last data filed at 06/13/14 0700  Gross per 24 hour  Intake 2344.49 ml  Output   1355 ml  Net 989.49 ml    PHYSICAL EXAMINATION: General:  No acute distress; intubated and sedated; appears stated age Neuro:  Awake on propofol/fent gtt,localizes appropriately.  No focal neuro deficits - moves all extremities to pain. Tracks to command. ? Has nystagmus onleft lateral gaze HEENT:  Pupils equally round and reactive to light; sclera anicteric. Mucus membranes moist.  Cardiovascular:  RRR, no MRG. No JVD. 2+ radial pulses bilaterally. 2+ pitting edema of lower extremities bilaterally Lungs:  Coarse inspiratory breath sounds; no wheezing or crackles.  Abdomen:  Soft, nontender, nondistended. Hypoactive bowel sounds Musculoskeletal:  No joint swelling or deformities noted. Normal muscle bulk Skin:  Erythematous lower extremity skin, with changes consistent with chronic venous stasis  LABS: PULMONARY  Recent Labs Lab 06/10/14 2054 06/10/14 2057 06/10/14 2311 06/11/14 0500  PHART  --  7.442 7.362 7.476*  PCO2ART  --  51.2* 62.5* 42.8  PO2ART  --  76.0* 493.0* 96.1  HCO3  --  34.9* 35.4* 31.2*  TCO2 31 36 37 32.5  O2SAT  --  95.0 100.0 96.7    CBC  Recent Labs Lab 06/10/14 2036 06/10/14 2054 06/11/14 0340 06/12/14 0744  HGB 19.9* 22.4* 17.5* 15.5  HCT 57.0* 66.0* 50.6 47.2  WBC 15.8*  --  14.2* 9.8  PLT 202  --  127* 117*    COAGULATION  Recent Labs Lab 06/10/14 2036  INR 1.46    CARDIAC     Recent Labs Lab 06/10/14 2036 06/11/14 1100 06/11/14 1821 06/12/14 0154  TROPONINI <0.30 <0.30 <0.30 <0.30    Recent Labs Lab 06/12/14 0744  PROBNP 1108.0*     CHEMISTRY  Recent Labs Lab 06/10/14 2036 06/10/14 2054 06/11/14 0340 06/12/14 0744 06/13/14 0340  NA 140 139 140  --   --   K 3.2* 3.1* 5.1  --   --   CL 84* 87* 92*  --   --   CO2 30  --  26  --   --   GLUCOSE 169* 178* 118*  --   --   BUN 24* 25* 21  --   --   CREATININE 1.15 1.20 1.02  --   --   CALCIUM 6.1*  --  5.7*  --   --   MG  --   --   --  0.6* 1.7  PHOS  --   --   --  3.6 3.2   Estimated Creatinine Clearance: 76 ml/min (by C-G formula based on Cr of 1.02).   LIVER  Recent Labs Lab 06/10/14 2036  AST 34  ALT 20  ALKPHOS 109  BILITOT 1.1  PROT 7.1  ALBUMIN 3.6  INR 1.46     INFECTIOUS  Recent Labs Lab 06/10/14 2054 06/11/14 0153 06/11/14 1100 06/12/14 0147 06/12/14 0744 06/13/14 0340  LATICACIDVEN 6.63* 2.7*  --  1.2  --   --   PROCALCITON  --   --  <0.10  --  <0.10 0.16     ENDOCRINE CBG (last 3)   Recent Labs  06/12/14 2308 06/13/14 0331 06/13/14 0750  GLUCAP 89 93 93         IMAGING x48h Mr Brain W Wo Contrast  06/12/2014   CLINICAL DATA:  New onset seizures.  EXAM: MRI HEAD WITHOUT AND WITH CONTRAST  TECHNIQUE: Multiplanar, multiecho pulse sequences of the brain and surrounding structures were obtained without and with intravenous contrast.  CONTRAST:  53mL MULTIHANCE GADOBENATE DIMEGLUMINE 529 MG/ML IV SOLN  COMPARISON:  Head CT 06/10/2014  FINDINGS: Images are mildly to moderately degraded by motion artifact.  Detailed evaluation of the hippocampi is precluded by motion artifact. Hippocampi are grossly symmetric in size. There is no evidence of acute infarct, intracranial hemorrhage, mass, midline shift, or extra-axial fluid collection. 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 Remote right basal ganglia lacunar infarct is also noted. Scattered,  small foci of T2 hyperintensity in the subcortical and deep cerebral white matter bilaterally are nonspecific but compatible with mild chronic small vessel ischemic disease. There is mild to moderate generalized cerebral atrophy. There is no abnormal enhancement.  Orbits are unremarkable. There is minimal bilateral ethmoid air cell mucosal thickening. There are small bilateral mastoid effusions. Distal left vertebral artery flow void is not well seen and may be stenotic or occluded proximally. Other major intracranial vascular flow voids are preserved.  IMPRESSION: 1. No acute intracranial  abnormality or mass. 2. Mild chronic small vessel ischemic disease and cerebral atrophy. 3. 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 4. Stenotic versus occluded distal left vertebral artery.   Electronically Signed   By: Logan Bores   On: 06/12/2014 14:06   Dg Chest Port 1 View  06/12/2014   CLINICAL DATA:  Intubation  EXAM: PORTABLE CHEST - 1 VIEW  COMPARISON:  Portable exam 0511 hr compared to 06/11/2014  FINDINGS: Tip of endotracheal tube projects 3.0 cm above carina.  Nasogastric tube extends into stomach.  Enlargement of cardiac silhouette with pulmonary vascular congestion.  Mediastinal contour stable.  Atherosclerotic calcification aortic arch.  Atelectasis versus consolidation in LEFT lower lobe unchanged.  Minimal RIGHT base atelectasis.  Remaining lungs demonstrate coarsened markings without definite infiltrate.  Probable small LEFT pleural effusion.  No pneumothorax.  IMPRESSION: Enlargement of cardiac silhouette with pulmonary vascular congestion.  Persistent atelectasis versus consolidation in LEFT lower lobe with suspect small LEFT pleural effusion.   Electronically Signed   By: Lavonia Dana M.D.   On: 06/12/2014 07:33      ASSESSMENT / PLAN:  PULMONARY OETT 9/25 >> A: Acute respiratory failure due to inability to protect airway. PE ruled out CTA 06/10/14  - COPD with emphysema and  ?fibrosis on CT chest (very mild appearing left lower lobe, consider aspiration).  Hgb of 22 on presentation suggests chronic hypoxia   P:   Daily SBT/WUA with goal extubation soon Scheduled duoneb and prn albuterol  CARDIOVASCULAR CVL n/a A: Afib with baseline echo showing cor pulmonale Hypertension Peripheral vascular disease Syncope >> etiology seizure? Consider valvular disease, arrhythmia, worsening pulmonary hypertension (secondary to lung disease), and orthostasis due to dehydration    ECHO with RV dilatation and dysfunction but PE ruled out.  Likely cor pulmonale  P:  Continue home ASA Restart home eliquis and pletal on 06/12/14 (ok per neuro) Continue home metoprolol, dilt, , statin AVoid home  ace inhibitor in presence of COPD    RENAL A:   Lactic acidosis presumably from seizure at admit. REsolved 06/12/14   - Hypomag 06/12/14 (severe -0.6)  P:   Replete mag Monitor BMET and UOP Replace electrolytes as needed Lasix x1 , dc IVFs CK slight high   GASTROINTESTINAL A:  Diverticulosis Gallbladder sludge but no evidence of cholecystitis History of recent nausea/vomiting/abdominal pain with unclear etiology . CT abdomen/pelvis suggests cirrhosis. INR and albumin are reasonable so does not seem like decompensated liver    - nil acute on exam      - tolerating tube feeds 06/12/14. Lipase normal. CK somewhat high  P:   OG tube with  tube feedings since 9/26  H2 blocker    HEMATOLOGIC A:  No acute issues P:  Monitor cbc intermittently PRBC for hgb < 7gm%  INFECTIOUS A:  No acute issues. Normal PCT P:   Monitor off abx   ENDOCRINE A:  DM2 P:   SSI q4H  NEUROLOGIC A:  Acute syncope, s/p long workup .? Seizure , remote infarcts   -Nystagmus/   Ocular "bobbing" seen 06/12/14. Neuro suspecting brain stem lesion    P:    EEG pending, Neuro following Continue propofol gtt for now (CK high - recheck and monitor to avoid Propofol infusion  syndrome) Fentanyl prn RASS goal 0 Home baclofen   [facility] ppx: Famotidine DVT ppx: back on home eliquis from 06/12/14   Family updated:  06/10/14:  Discussed current status with wife Jana Half on admission.  Her cell is [phone] and home is [phone] 06/11/14 amd 06/12/14: Wife again updated at bedside   TODAY'S SUMMARY:  73 y/o male with syncope and possibly status epilepticus with pulmonary hypertension and known Afib.  Intubated for airway protection.  Plan wean , neurology work up for seizure/syncope. Hope to extubate soon Wife updated     The patient is critically ill with multiple organ systems failure and requires high complexity decision making for assessment and support, frequent evaluation and titration of therapies, application of advanced monitoring technologies and extensive interpretation of multiple databases.   Critical Care Time devoted to patient care services described in this note is  35  Minutes.  Kara Mead MD. Sh[facility] Flood. [facility] Pager [phone] If no response call [phone]    06/13/2014 8:46 AM

## 2014-06-13 NOTE — Progress Notes (Signed)
eLink Physician-Brief Progress Note Patient Name: Chad Morse DOB: 1940/09/29 MRN: [id number]   Date of Service  06/13/2014  HPI/Events of Note    eICU Interventions  Changed dilt CD capsules to dilt tablets 30mg  q6h     Intervention Category Minor Interventions: Routine modifications to care plan (e.g. PRN medications for pain, fever)  Tyann Niehaus S. 06/13/2014, 4:18 PM

## 2014-06-13 NOTE — Progress Notes (Signed)
NUTRITION FOLLOW-UP  DOCUMENTATION CODES Per approved criteria  -Severe malnutrition in the context of acute illness or injury   Pt meets criteria for SEVERE MALNUTRITION in the context of ACUTE ILLNESS as evidenced by estimated energy intake <50% of estimated energy needs for > 5 days and 13% weight loss in less than 3 months.  INTERVENTION:  D/C Vital High Protein Vital AF 1.2 @ 40 ml/hr via OGT   60 ml Prostat BID.    Tube feeding regimen provides 1542 kcal, 132 grams of protein, and 778 ml ml of H2O.  TF regimen plus current rate of propofol will provide 1982 kcal (102% of estimated energy needs)  NUTRITION DIAGNOSIS: Inadequate oral intake related to inability to eat as evidenced by NPO status; ongoing.   Goal: Pt to meet >/= 90% of their estimated nutrition needs, not met.  Monitor:  Vent status, TF tolerance, Weight trend, Labs  ASSESSMENT: 73 y/o male with no past history of seizure disorder was admitted on 9/25 after syncope and an apparent seizure at home. He had another seizure in the ED and was intubated for airway protection. Gallbladder sludge but no evidence of cholecystitis  Patient is currently intubated on ventilator support MV: 9.1 L/min Temp (24hrs), Avg:99.4 F (37.4 C), Min:98.6 F (37 C), Max:100.5 F (38.1 C)  Propofol: 16.7 ml/hr (Provides 440 kcal per 24 hours) Per RN propofol had been stopped but they had to resume. Pt will not be extubated today.   Vital High Protein @ 35 ml/hr with 30 ml Prostat TID. Tube feeding regimen provides 1140 kcal, 119 grams of protein, and 706 ml ml of H2O.  TF regimen plus current rate of propofol will provide 1580 kcal (81% of estimated energy needs)  Height: Ht Readings from Last 1 Encounters:  06/11/14 _0  (1.803 m)    Weight: Wt Readings from Last 1 Encounters:  06/13/14 224 lb 10.4 oz (101.9 kg)  Admission weight: 205 lb (93 kg) 9/25 - BMI 28.6  BMI:  Body mass index is 31.35 kg/(m^2).  Estimated  Nutritional Needs: Kcal: 1940 Protein: 120-140 grams Fluid: 2.5 L/day  Skin: non-pitting RLE and LLE edema  Diet Order:     Intake/Output Summary (Last 24 hours) at 06/13/14 1317 Last data filed at 06/13/14 1300  Gross per 24 hour  Intake 2299.32 ml  Output   2935 ml  Net -635.68 ml    Last BM: PTA   Labs:   Recent Labs Lab 06/10/14 2036 06/10/14 2054 06/11/14 0340 06/12/14 0744 06/13/14 0340 06/13/14 0945  NA 140 139 140  --   --  139  K 3.2* 3.1* 5.1  --   --  3.2*  CL 84* 87* 92*  --   --  94*  CO2 30  --  26  --   --  33*  BUN 24* 25* 21  --   --  13  CREATININE 1.15 1.20 1.02  --   --  0.64  CALCIUM 6.1*  --  5.7*  --   --  6.0*  MG  --   --   --  0.6* 1.7  --   PHOS  --   --   --  3.6 3.2  --   GLUCOSE 169* 178* 118*  --   --  104*    CBG (last 3)   Recent Labs  06/13/14 0331 06/13/14 0750 06/13/14 1122  GLUCAP 93 93 142*    Scheduled Meds: . antiseptic oral rinse  7 mL Mouth Rinse QID  . apixaban  5 mg Oral BID  . aspirin  81 mg Oral Daily  . baclofen  10 mg Oral 2 times per day  . calcium gluconate  2 g Intravenous Once  . chlorhexidine  15 mL Mouth Rinse BID  . cilostazol  50 mg Oral BID  . diltiazem  120 mg Oral Daily  . famotidine (PEPCID) IV  20 mg Intravenous Q12H  . feeding supplement (PRO-STAT SUGAR FREE 64)  30 mL Per Tube TID WC  . feeding supplement (VITAL HIGH PROTEIN)  1,000 mL Per Tube Q24H  . fentaNYL  50 mcg Intravenous Once  . insulin aspart  0-15 Units Subcutaneous 6 times per day  . ipratropium-albuterol  3 mL Nebulization Q6H  . levETIRAcetam  500 mg Intravenous Q12H  . metoprolol  25 mg Oral QPM  . potassium chloride  40 mEq Per Tube Q6H  . simvastatin  20 mg Oral QPM    Continuous Infusions: . sodium chloride 10 mL/hr at 06/12/14 1020  . fentaNYL infusion INTRAVENOUS 100 mcg/hr (06/12/14 2000)  . propofol 30 mcg/kg/min (06/13/14 1240)   [place], [place], [place] Pager [phone] After Hours  Pager

## 2014-06-14 ENCOUNTER — Encounter (HOSPITAL_COMMUNITY): Payer: Self-pay | Admitting: Anesthesiology

## 2014-06-14 ENCOUNTER — Inpatient Hospital Stay (HOSPITAL_COMMUNITY): Payer: Medicare Other

## 2014-06-14 DIAGNOSIS — J9600 Acute respiratory failure, unspecified whether with hypoxia or hypercapnia: Secondary | ICD-10-CM

## 2014-06-14 DIAGNOSIS — G40401 Other generalized epilepsy and epileptic syndromes, not intractable, with status epilepticus: Secondary | ICD-10-CM

## 2014-06-14 DIAGNOSIS — J449 Chronic obstructive pulmonary disease, unspecified: Secondary | ICD-10-CM

## 2014-06-14 DIAGNOSIS — J690 Pneumonitis due to inhalation of food and vomit: Secondary | ICD-10-CM

## 2014-06-14 LAB — CBC WITH DIFFERENTIAL/PLATELET
BASOS ABS: 0 10*3/uL (ref 0.0–0.1)
Basophils Relative: 0 % (ref 0–1)
EOS PCT: 2 % (ref 0–5)
Eosinophils Absolute: 0.1 10*3/uL (ref 0.0–0.7)
HEMATOCRIT: 49.3 % (ref 39.0–52.0)
Hemoglobin: 16.1 g/dL (ref 13.0–17.0)
Lymphocytes Relative: 15 % (ref 12–46)
Lymphs Abs: 1.3 10*3/uL (ref 0.7–4.0)
MCH: 29.9 pg (ref 26.0–34.0)
MCHC: 32.7 g/dL (ref 30.0–36.0)
MCV: 91.6 fL (ref 78.0–100.0)
MONO ABS: 1.2 10*3/uL — AB (ref 0.1–1.0)
MONOS PCT: 14 % — AB (ref 3–12)
NEUTROS ABS: 6.1 10*3/uL (ref 1.7–7.7)
Neutrophils Relative %: 69 % (ref 43–77)
Platelets: 123 10*3/uL — ABNORMAL LOW (ref 150–400)
RBC: 5.38 MIL/uL (ref 4.22–5.81)
RDW: 16.5 % — AB (ref 11.5–15.5)
WBC: 8.8 10*3/uL (ref 4.0–10.5)

## 2014-06-14 LAB — PHOSPHORUS: PHOSPHORUS: 2.2 mg/dL — AB (ref 2.3–4.6)

## 2014-06-14 LAB — BASIC METABOLIC PANEL
ANION GAP: 11 (ref 5–15)
BUN: 16 mg/dL (ref 6–23)
CHLORIDE: 96 meq/L (ref 96–112)
CO2: 33 meq/L — AB (ref 19–32)
CREATININE: 0.7 mg/dL (ref 0.50–1.35)
Calcium: 6.9 mg/dL — ABNORMAL LOW (ref 8.4–10.5)
GFR calc Af Amer: >90 mL/min (ref >90)
GFR calc non Af Amer: >90 mL/min (ref >90)
Glucose, Bld: 109 mg/dL — ABNORMAL HIGH (ref 70–99)
Potassium: 3.9 mEq/L (ref 3.7–5.3)
Sodium: 140 mEq/L (ref 137–147)

## 2014-06-14 LAB — BLOOD GAS, ARTERIAL
Acid-Base Excess: 7.9 mmol/L — ABNORMAL HIGH (ref 0.0–2.0)
Bicarbonate: 32.9 mEq/L — ABNORMAL HIGH (ref 20.0–24.0)
Drawn by: 244801
FIO2: 0.4 %
MECHVT: 600 mL
O2 SAT: 94.2 %
PATIENT TEMPERATURE: 98.6
PEEP: 5 cmH2O
RATE: 15 resp/min
TCO2: 34.7 mmol/L (ref 0–100)
pCO2 arterial: 55.6 mmHg — ABNORMAL HIGH (ref 35.0–45.0)
pH, Arterial: 7.391 (ref 7.350–7.450)
pO2, Arterial: 75.6 mmHg — ABNORMAL LOW (ref 80.0–100.0)

## 2014-06-14 LAB — GLUCOSE, CAPILLARY
GLUCOSE-CAPILLARY: 182 mg/dL — AB (ref 70–99)
Glucose-Capillary: 123 mg/dL — ABNORMAL HIGH (ref 70–99)
Glucose-Capillary: 144 mg/dL — ABNORMAL HIGH (ref 70–99)
Glucose-Capillary: 149 mg/dL — ABNORMAL HIGH (ref 70–99)
Glucose-Capillary: 158 mg/dL — ABNORMAL HIGH (ref 70–99)
Glucose-Capillary: 182 mg/dL — ABNORMAL HIGH (ref 70–99)
Glucose-Capillary: 198 mg/dL — ABNORMAL HIGH (ref 70–99)

## 2014-06-14 LAB — TRIGLYCERIDES: Triglycerides: 136 mg/dL (ref <150)

## 2014-06-14 LAB — MAGNESIUM: Magnesium: 1.7 mg/dL (ref 1.5–2.5)

## 2014-06-14 MED ORDER — METHYLPREDNISOLONE SODIUM SUCC 125 MG IJ SOLR
INTRAMUSCULAR | Status: AC
  Filled 2014-06-14: qty 2

## 2014-06-14 MED ORDER — METHYLPREDNISOLONE SODIUM SUCC 125 MG IJ SOLR
80.0000 mg | Freq: Once | INTRAMUSCULAR | Status: AC
  Administered 2014-06-14: 80 mg via INTRAVENOUS

## 2014-06-14 MED ORDER — VANCOMYCIN HCL 10 G IV SOLR
1750.0000 mg | Freq: Once | INTRAVENOUS | Status: AC
  Administered 2014-06-14: 1750 mg via INTRAVENOUS
  Filled 2014-06-14: qty 1750

## 2014-06-14 MED ORDER — VANCOMYCIN HCL IN DEXTROSE 1-5 GM/200ML-% IV SOLN
1000.0000 mg | Freq: Two times a day (BID) | INTRAVENOUS | Status: DC
  Administered 2014-06-15 – 2014-06-16 (×3): 1000 mg via INTRAVENOUS
  Filled 2014-06-14 (×4): qty 200

## 2014-06-14 MED ORDER — MIDAZOLAM HCL 2 MG/2ML IJ SOLN
4.0000 mg | Freq: Once | INTRAMUSCULAR | Status: AC
Start: 2014-06-14 — End: 2014-06-14
  Administered 2014-06-14: 4 mg via INTRAVENOUS

## 2014-06-14 MED ORDER — SUCCINYLCHOLINE CHLORIDE 20 MG/ML IJ SOLN
INTRAMUSCULAR | Status: AC
  Filled 2014-06-14: qty 1

## 2014-06-14 MED ORDER — DEXTROSE 5 % IV SOLN
1.0000 g | Freq: Three times a day (TID) | INTRAVENOUS | Status: DC
  Administered 2014-06-14 – 2014-06-16 (×6): 1 g via INTRAVENOUS
  Filled 2014-06-14 (×8): qty 1

## 2014-06-14 MED ORDER — ROCURONIUM BROMIDE 50 MG/5ML IV SOLN
INTRAVENOUS | Status: AC
  Filled 2014-06-14: qty 2

## 2014-06-14 MED ORDER — LIDOCAINE HCL (CARDIAC) 20 MG/ML IV SOLN
INTRAVENOUS | Status: AC
  Filled 2014-06-14: qty 5

## 2014-06-14 MED ORDER — METHYLPREDNISOLONE SODIUM SUCC 40 MG IJ SOLR
40.0000 mg | Freq: Three times a day (TID) | INTRAMUSCULAR | Status: DC
  Administered 2014-06-14 – 2014-06-16 (×6): 40 mg via INTRAVENOUS
  Filled 2014-06-14 (×11): qty 1

## 2014-06-14 MED ORDER — MIDAZOLAM HCL 2 MG/2ML IJ SOLN
INTRAMUSCULAR | Status: AC
  Filled 2014-06-14: qty 4

## 2014-06-14 MED ORDER — SODIUM CHLORIDE 0.9 % IV SOLN
1.0000 g | Freq: Once | INTRAVENOUS | Status: AC
  Administered 2014-06-14: 1 g via INTRAVENOUS
  Filled 2014-06-14: qty 10

## 2014-06-14 MED ORDER — ETOMIDATE 2 MG/ML IV SOLN
INTRAVENOUS | Status: AC
  Filled 2014-06-14: qty 20

## 2014-06-14 MED ORDER — PROPOFOL 10 MG/ML IV EMUL
5.0000 ug/kg/min | INTRAVENOUS | Status: DC
  Administered 2014-06-14 – 2014-06-15 (×2): 20 ug/kg/min via INTRAVENOUS
  Administered 2014-06-15 (×2): 30 ug/kg/min via INTRAVENOUS
  Administered 2014-06-15: 20 ug/kg/min via INTRAVENOUS
  Administered 2014-06-15: 30 ug/kg/min via INTRAVENOUS
  Administered 2014-06-16 (×2): 35 ug/kg/min via INTRAVENOUS
  Administered 2014-06-16: 25 ug/kg/min via INTRAVENOUS
  Administered 2014-06-16: 40 ug/kg/min via INTRAVENOUS
  Administered 2014-06-17: 45 ug/kg/min via INTRAVENOUS
  Filled 2014-06-14 (×12): qty 100

## 2014-06-14 MED ORDER — ETOMIDATE 2 MG/ML IV SOLN
20.0000 mg | Freq: Once | INTRAVENOUS | Status: AC
  Administered 2014-06-14: 20 mg via INTRAVENOUS

## 2014-06-14 NOTE — Procedures (Signed)
Extubation Procedure Note  Patient Details:   Name: Chad Morse DOB: 07/18/1941 MRN: [id number]   Airway Documentation:  Airway 7.5 mm (Active)  Secured at (cm) 23 cm 06/14/2014  9:15 AM  Measured From Lips 06/14/2014  9:15 AM  Secured Location Right 06/14/2014  9:15 AM  Secured By [employer] 06/14/2014  9:15 AM  Site Condition Dry 06/14/2014  9:15 AM    Evaluation  O2 sats: stable throughout Complications: Patient has experienced some increased WOB and stridor post extubation. Dr. Elsworth Soho was at bedside throughout procedure and remains at bedside to assess the need for re intubation.   Patient did not tolerate procedure well.  Bilateral Breath Sounds: Rhonchi  Suctioning: Airway  Patient was extubated per MD to nasal cannula. Patient has a large amount of secretions and is coughing continuously. MD at bedside. Large amounts of secretions are being suctioned from patient's airway. Patient is in moderate distress currently and MD is assessing whether re intubation is warranted currently or not. Rt will continue to monitor.   Baird Lyons 06/14/2014, 9:25 AM

## 2014-06-14 NOTE — Procedures (Signed)
Intubation Procedure Note Chad Morse [id number] [birth year]  Procedure: Intubation Indications: Respiratory insufficiency  Procedure Details Consent: Risks of procedure as well as the alternatives and risks of each were explained to the (patient/caregiver).  Consent for procedure obtained. Time Out: Verified patient identification, verified procedure, site/side was marked, verified correct patient position, special equipment/implants available, medications/allergies/relevent history reviewed, required imaging and test results available.  Performed  MAC and 4 Medications:  Fentanyl 100 mcg Etomidate 20 mg Versed 2 mg    Evaluation Hemodynamic Status: BP stable throughout; O2 sats: stable throughout Patient's Current Condition: stable Complications: No apparent complications Patient did tolerate procedure well. Chest X-ray ordered to verify placement.  CXR: pending.   Richardson Landry Minor ACNP [facility] Shape PCCM Pager [phone] till 3 pm If no answer page [phone]  Supervised by me  Rigoberto Noel. MD 06/14/2014, 9:28 AM

## 2014-06-14 NOTE — Progress Notes (Signed)
ANTIBIOTIC CONSULT NOTE - INITIAL  Pharmacy Consult for azactam, vancomycin Indication: PNA  Allergies  Allergen Reactions  . Ciprofloxacin Hives  . Penicillins Hives  . Sulfonamide Derivatives Hives    Patient Measurements: Height: 5\' 11"  (180.3 cm) Weight: 224 lb 3.3 oz (101.7 kg) IBW/kg (Calculated) : 75.3  Vital Signs: Temp: 98.5 F (36.9 C) (09/29 0758) Temp src: Axillary (09/29 0758) BP: 117/62 mmHg (09/29 1100) Pulse Rate: 105 (09/29 1100) Intake/Output from previous day: 09/28 0701 - 09/29 0700 In: 2944.9 [I.V.:991.9; NG/GT:1453; IV Piggyback:470] Out: 3100 [Urine:3100] Intake/Output from this shift: Total I/O In: 10 [I.V.:10] Out: -   Labs:  Recent Labs  06/12/14 0744 06/13/14 0340 06/13/14 0945 06/14/14 0347  WBC 9.8 9.3  --  8.8  HGB 15.5 16.8  --  16.1  PLT 117* PLATELETS APPEAR DECREASED  --  123*  CREATININE  --   --  0.64 0.70   Estimated Creatinine Clearance: 96.9 ml/min (by C-G formula based on Cr of 0.7). No results found for this basename: VANCOTROUGH, VANCOPEAK, VANCORANDOM, [place], [place], [place], [place], TOBRAPEAK, TOBRARND, AMIKACINPEAK, AMIKACINTROU, AMIKACIN,  in the last 72 hours   Microbiology: Recent Results (from the past 720 hour(s))  CULTURE, BLOOD (ROUTINE X 2)     Status: None   Collection Time    06/10/14  9:30 PM      Result Value Ref Range Status   Specimen Description BLOOD LEFT HAND   Final   Special Requests BOTTLES DRAWN AEROBIC AND ANAEROBIC 4CC   Final   Culture  Setup Time     Final   Value: 06/11/2014 04:35     Performed at [facility]   Culture     Final   Value:        BLOOD CULTURE RECEIVED NO GROWTH TO DATE CULTURE WILL BE HELD FOR 5 DAYS BEFORE ISSUING A FINAL NEGATIVE REPORT     Performed at [facility]   Report Status PENDING   Incomplete  CULTURE, BLOOD (ROUTINE X 2)     Status: None   Collection Time    06/10/14  9:38 PM      Result Value Ref Range Status   Specimen  Description BLOOD LEFT FOREARM   Final   Special Requests BOTTLES DRAWN AEROBIC AND ANAEROBIC 5CC   Final   Culture  Setup Time     Final   Value: 06/11/2014 04:34     Performed at [facility]   Culture     Final   Value:        BLOOD CULTURE RECEIVED NO GROWTH TO DATE CULTURE WILL BE HELD FOR 5 DAYS BEFORE ISSUING A FINAL NEGATIVE REPORT     Performed at [facility]   Report Status PENDING   Incomplete  MRSA PCR SCREENING     Status: None   Collection Time    06/11/14 12:55 AM      Result Value Ref Range Status   MRSA by PCR NEGATIVE  NEGATIVE Final   Comment:            The GeneXpert MRSA Assay (FDA     approved for NASAL specimens     only), is one component of a     comprehensive MRSA colonization     surveillance program. It is not     intended to diagnose MRSA     infection nor to guide or     monitor treatment for     MRSA  infections.    Medical History: Past Medical History  Diagnosis Date  . COPD (chronic obstructive pulmonary disease)   . Diabetes mellitus   . Atrial fibrillation   . Peripheral neuropathy   . Gallstones   . Peripheral vascular disease   . IBS (irritable bowel syndrome)   . Peripheral arterial disease   . Tobacco abuse   . Hypertension   . Hyperlipidemia     Medications:  Scheduled:  . antiseptic oral rinse  7 mL Mouth Rinse QID  . apixaban  5 mg Oral BID  . aspirin  81 mg Oral Daily  . baclofen  10 mg Oral 2 times per day  . calcium gluconate  1 g Intravenous Once  . chlorhexidine  15 mL Mouth Rinse BID  . cilostazol  50 mg Oral BID  . diltiazem  30 mg Oral 4 times per day  . etomidate      . etomidate  20 mg Intravenous Once  . famotidine (PEPCID) IV  20 mg Intravenous Q12H  . feeding supplement (PRO-STAT SUGAR FREE 64)  60 mL Per Tube BID  . feeding supplement (VITAL AF 1.2 CAL)  1,000 mL Per Tube Q24H  . fentaNYL  50 mcg Intravenous Once  . insulin aspart  0-15 Units Subcutaneous 6 times per day  .  ipratropium-albuterol  3 mL Nebulization Q6H  . levETIRAcetam  500 mg Intravenous Q12H  . lidocaine (cardiac) 100 mg/46ml      . methylPREDNISolone sodium succinate      . methylPREDNISolone (SOLU-MEDROL) injection  40 mg Intravenous 3 times per day  . metoprolol  25 mg Oral QPM  . midazolam      . rocuronium      . simvastatin  20 mg Oral QPM  . succinylcholine         Assessment: 73 yo male here with seizure and with VDRF and concern for aspiration PNA to begin azactam and vancomycin (noted with PCN allergy). WBC= 8.8, tmax= 101.2, SCr= 0.7 and CrCl ~ 95.  9/29 azactam>> 9/29 vanc>>  9/25 blood x2- nhtd 9/29 resp  Goal of Therapy:  Vancomycin trough level 15-20 mcg/ml  Plan:  -Azactam 1gm IV q8h -Vancomycin 1750mg  IV followed by 1000mg  IV q12h -Will follow renal function, cultures and clinical progress  Hildred Laser, Pharm D 06/14/2014 11:21 AM

## 2014-06-14 NOTE — Procedures (Signed)
Central Venous Catheter Insertion Procedure Note Chad Morse [id number] Feb 21, 1941  Procedure: Insertion of Central Venous Catheter Indications: Assessment of intravascular volume, Drug and/or fluid administration and Frequent blood sampling  Procedure Details Consent: Risks of procedure as well as the alternatives and risks of each were explained to the (patient/caregiver).  Consent for procedure obtained. Time Out: Verified patient identification, verified procedure, site/side was marked, verified correct patient position, special equipment/implants available, medications/allergies/relevent history reviewed, required imaging and test results available.  Performed  Maximum sterile technique was used including antiseptics, cap, gloves, gown, hand hygiene, mask and sheet. Skin prep: Chlorhexidine; local anesthetic administered A antimicrobial bonded/coated triple lumen catheter was placed in the left internal jugular vein using the Seldinger technique. Ultrasound guidance used.Yes.   Catheter placed to 20 cm. Blood aspirated via all 3 ports and then flushed x 3. Line sutured x 2 and dressing applied.  Evaluation Blood flow good Complications: No apparent complications Patient did tolerate procedure well. Chest X-ray ordered to verify placement.  CXR: pending.  Richardson Landry Minor ACNP [facility] Shape PCCM Pager [phone] till 3 pm If no answer page [phone]  Ultrasound used for site verification, live visualisation of needle entry & guidewire prior to dilation CXR - shows CVL in good position  [facility]. MD 06/14/2014, 9:29 AM

## 2014-06-14 NOTE — Progress Notes (Addendum)
Subjective: Patient sedated.  Sedation was discontinued when preparations were being made for extubation.  Patient at that time was anxious, moving all extremities, and attempting to communicate.  Patient did not do well and although extubated, required reintubation.  No seizure activity has been noted.    Objective: Current vital signs: BP 201/103  Pulse 127  Temp(Src) 98.5 F (36.9 C) (Axillary)  Resp 18  Ht 5\' 11"  (1.803 m)  Wt 101.7 kg (224 lb 3.3 oz)  BMI 31.28 kg/m2  SpO2 95% Vital signs in last 24 hours: Temp:  [98.5 F (36.9 C)-101.2 F (38.4 C)] 98.5 F (36.9 C) (09/29 0758) Pulse Rate:  [80-127] 127 (09/29 0915) Resp:  [18] 18 (09/29 0915) BP: (97-201)/(57-103) 201/103 mmHg (09/29 0915) SpO2:  [90 %-98 %] 95 % (09/29 0915) FiO2 (%):  [40 %-50 %] 40 % (09/29 0915) Weight:  [101.7 kg (224 lb 3.3 oz)] 101.7 kg (224 lb 3.3 oz) (09/29 0500)  Intake/Output from previous day: 09/28 0701 - 09/29 0700 In: 2944.9 [I.V.:991.9; [place]/OV:7858; IV Piggyback:470] Out: 3100 [Urine:3100] Intake/Output this shift:   Nutritional status:    Neurologic Exam: Mental Status: Alert.  Follows commands.  Intubated.  Localizes to pain Cranial Nerves: II: Blinks to bilateral confrontation bilaterally, pupils equal, round, reactive to light and accommodation III,IV, VI: ptosis not present, extra-ocular motions intact bilaterally V,VII: corneals intact bilaterally VIII: hearing normal bilaterally IX,X: gag reflex present XI: bilateral shoulder shrug XII: midline tongue extension Motor: Able to lift arms off the bed with no focality noted.  Did not lift legs off the bed.   Sensory: Pinprick and light touch intact throughout, bilaterally Deep Tendon Reflexes: 2+ in the upper extremities, 1+ at the knees and absent at the ankles bilaterally Plantars: Right: upgoing   Left: upgoing   Lab Results: Basic Metabolic Panel:  Recent Labs Lab 06/10/14 2036 06/10/14 2054 06/11/14 0340  06/12/14 0744 06/13/14 0340 06/13/14 0945 06/14/14 0347  NA 140 139 140  --   --  139 140  K 3.2* 3.1* 5.1  --   --  3.2* 3.9  CL 84* 87* 92*  --   --  94* 96  CO2 30  --  26  --   --  33* 33*  GLUCOSE 169* 178* 118*  --   --  104* 109*  BUN 24* 25* 21  --   --  13 16  CREATININE 1.15 1.20 1.02  --   --  0.64 0.70  CALCIUM 6.1*  --  5.7*  --   --  6.0* 6.9*  MG  --   --   --  0.6* 1.7  --  1.7  PHOS  --   --   --  3.6 3.2  --  2.2*    Liver Function Tests:  Recent Labs Lab 06/10/14 2036  AST 34  ALT 20  ALKPHOS 109  BILITOT 1.1  PROT 7.1  ALBUMIN 3.6    Recent Labs Lab 06/12/14 0744  LIPASE 13   No results found for this basename: AM[place],  in the last 168 hours  CBC:  Recent Labs Lab 06/10/14 2036 06/10/14 2054 06/11/14 0340 06/12/14 0744 06/13/14 0340 06/14/14 0347  WBC 15.8*  --  14.2* 9.8 9.3 8.8  NEUTROABS 14.4*  --   --  6.8 7.1 6.1  HGB 19.9* 22.4* 17.5* 15.5 16.8 16.1  HCT 57.0* 66.0* 50.6 47.2 50.5 49.3  MCV 86.1  --  84.9 88.1 88.1 91.6  PLT 202  --  127* 117* PLATELETS APPEAR DECREASED 123*    Cardiac Enzymes:  Recent Labs Lab 06/10/14 2036 06/11/14 1100 06/11/14 1821 06/12/14 0154 06/12/14 0744 06/13/14 0340  CKTOTAL  --   --   --   --  369* 515*  CKMB  --   --   --   --  1.1 <1.0  TROPONINI <0.30 <0.30 <0.30 <0.30  --   --     Lipid Panel:  Recent Labs Lab 06/14/14 0347  TRIG 136    CBG:  Recent Labs Lab 06/13/14 1616 06/13/14 2054 06/14/14 0001 06/14/14 0443 06/14/14 0757  GLUCAP 138* 111* 144* 149* 78*    Microbiology: Results for orders placed during the hospital encounter of 06/10/14  CULTURE, BLOOD (ROUTINE X 2)     Status: None   Collection Time    06/10/14  9:30 PM      Result Value Ref Range Status   Specimen Description BLOOD LEFT HAND   Final   Special Requests BOTTLES DRAWN AEROBIC AND ANAEROBIC 4CC   Final   Culture  Setup Time     Final   Value: 06/11/2014 04:35     Performed at [facility]   Culture     Final   Value:        BLOOD CULTURE RECEIVED NO GROWTH TO DATE CULTURE WILL BE HELD FOR 5 DAYS BEFORE ISSUING A FINAL NEGATIVE REPORT     Performed at [facility]   Report Status PENDING   Incomplete  CULTURE, BLOOD (ROUTINE X 2)     Status: None   Collection Time    06/10/14  9:38 PM      Result Value Ref Range Status   Specimen Description BLOOD LEFT FOREARM   Final   Special Requests BOTTLES DRAWN AEROBIC AND ANAEROBIC 5CC   Final   Culture  Setup Time     Final   Value: 06/11/2014 04:34     Performed at [facility]   Culture     Final   Value:        BLOOD CULTURE RECEIVED NO GROWTH TO DATE CULTURE WILL BE HELD FOR 5 DAYS BEFORE ISSUING A FINAL NEGATIVE REPORT     Performed at [facility]   Report Status PENDING   Incomplete  MRSA PCR SCREENING     Status: None   Collection Time    06/11/14 12:55 AM      Result Value Ref Range Status   MRSA by PCR NEGATIVE  NEGATIVE Final   Comment:            The GeneXpert MRSA Assay (FDA     approved for NASAL specimens     only), is one component of a     comprehensive MRSA colonization     surveillance program. It is not     intended to diagnose MRSA     infection nor to guide or     monitor treatment for     MRSA infections.    Coagulation Studies: No results found for this basename: LABPROT, INR,  in the last 72 hours  Imaging: Mr Jeri Cos Wo Contrast  06/12/2014   CLINICAL DATA:  New onset seizures.  EXAM: MRI HEAD WITHOUT AND WITH CONTRAST  TECHNIQUE: Multiplanar, multiecho pulse sequences of the brain and surrounding structures were obtained without and with intravenous contrast.  CONTRAST:  61mL MULTIHANCE GADOBENATE DIMEGLUMINE 529 MG/ML IV SOLN  COMPARISON:  Head CT 06/10/2014  FINDINGS: Images are mildly to moderately degraded by motion artifact.  Detailed evaluation of the hippocampi is precluded by motion artifact. Hippocampi are grossly symmetric in size. There is no evidence  of acute infarct, intracranial hemorrhage, mass, midline shift, or extra-axial fluid collection. 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 Remote right basal ganglia lacunar infarct is also noted. Scattered, small foci of T2 hyperintensity in the subcortical and deep cerebral white matter bilaterally are nonspecific but compatible with mild chronic small vessel ischemic disease. There is mild to moderate generalized cerebral atrophy. There is no abnormal enhancement.  Orbits are unremarkable. There is minimal bilateral ethmoid air cell mucosal thickening. There are small bilateral mastoid effusions. Distal left vertebral artery flow void is not well seen and may be stenotic or occluded proximally. Other major intracranial vascular flow voids are preserved.  IMPRESSION: 1. No acute intracranial abnormality or mass. 2. Mild chronic small vessel ischemic disease and cerebral atrophy. 3. 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 4. Stenotic versus occluded distal left vertebral artery.   Electronically Signed   By: Logan Bores   On: 06/12/2014 14:06   Dg Chest Port 1 View  06/14/2014   CLINICAL DATA:  73 year old male with new line Placement. Initial encounter.  EXAM: PORTABLE CHEST - 1 VIEW  COMPARISON:  0506 hr today and earlier.  FINDINGS: Portable AP semi upright view at 0934 hrs. Stable endotracheal tube tip at the level the clavicles. New left IJ approach central line, tip projects just below the clavicle. No pneumothorax. Stable cardiac size and mediastinal contours. Enteric tube in place, courses to the left upper quadrant and tip not included. Stable ventilation.  IMPRESSION: 1. Left IJ central line placed, tip at the lower SVC level. No pneumothorax and stable ventilation. 2. Enteric tube courses to the left upper quadrant, tip not included. 3. Stable endotracheal tube.   Electronically Signed   By: Lars Pinks M.D.   On: 06/14/2014 09:49   Dg Chest Port 1 View  06/14/2014   CLINICAL  DATA:  73 year old male with chronic obstructive pulmonary disease and atrial fibrillation. Assess endotracheal tube. Subsequent encounter.  EXAM: PORTABLE CHEST - 1 VIEW  COMPARISON:  06/13/2014  FINDINGS: Mild rotation to the left. Endotracheal tube tip 3.5 cm above the carina.  Nasogastric tube courses below the diaphragm. Tip is not included on the present exam.  Heart size top-normal.  Pulmonary vascular congestion most notable centrally.  Left base consolidation minimally improved.  IMPRESSION: Endotracheal tube tip 3.5 cm above the carina.  Pulmonary vascular congestion.  Left base consolidation minimally improved.   Electronically Signed   By: Chauncey Cruel M.D.   On: 06/14/2014 07:29   Dg Chest Port 1 View  06/13/2014   CLINICAL DATA:  Check ETT position.  EXAM: PORTABLE CHEST - 1 VIEW  COMPARISON:  06/12/2014  FINDINGS: ET tube and NG tube are in stable position. Continued left lower lobe atelectasis or infiltrate with small left effusion. Increasing right basilar atelectasis or infiltrate. Heart is upper limits normal in size.  IMPRESSION: Stable left basilar atelectasis or infiltrate and small effusion.  Increasing right basilar atelectasis or infiltrate   Electronically Signed   By: Rolm Baptise M.D.   On: 06/13/2014 07:14   Dg Abd Portable 1v  06/14/2014   CLINICAL DATA:  OG tube position.  Abdominal distention.  EXAM: PORTABLE ABDOMEN - 1 VIEW  COMPARISON:  CT 06/10/2014  FINDINGS: Enteric tube tip is in the mid stomach. Mild  diffuse gaseous distention of bowel without evidence for obstruction. No supine evidence of free air.  IMPRESSION: Enteric tube tip in the mid stomach.   Electronically Signed   By: Rolm Baptise M.D.   On: 06/14/2014 09:48    Medications:  I have reviewed the patient's current medications. Scheduled: . antiseptic oral rinse  7 mL Mouth Rinse QID  . apixaban  5 mg Oral BID  . aspirin  81 mg Oral Daily  . baclofen  10 mg Oral 2 times per day  . chlorhexidine  15 mL  Mouth Rinse BID  . cilostazol  50 mg Oral BID  . diltiazem  30 mg Oral 4 times per day  . etomidate      . etomidate  20 mg Intravenous Once  . famotidine (PEPCID) IV  20 mg Intravenous Q12H  . feeding supplement (PRO-STAT SUGAR FREE 64)  60 mL Per Tube BID  . feeding supplement (VITAL AF 1.2 CAL)  1,000 mL Per Tube Q24H  . fentaNYL  50 mcg Intravenous Once  . insulin aspart  0-15 Units Subcutaneous 6 times per day  . ipratropium-albuterol  3 mL Nebulization Q6H  . levETIRAcetam  500 mg Intravenous Q12H  . lidocaine (cardiac) 100 mg/29ml      . methylPREDNISolone (SOLU-MEDROL) injection  80 mg Intravenous Once  . methylPREDNISolone sodium succinate      . metoprolol  25 mg Oral QPM  . midazolam      . midazolam  4 mg Intravenous Once  . rocuronium      . simvastatin  20 mg Oral QPM  . succinylcholine        Assessment/Plan: No seizure activity noted.  EEG was diffusely slow.  This may be a medication effect.  Patient remains on Keppra.  Magnesium normal today at 1.7.  Recommendations: 1.  Agree with continued Keppra   LOS: 4 days   Alexis Goodell, MD [facility] [phone] 06/14/2014  9:55 AM

## 2014-06-14 NOTE — Progress Notes (Signed)
PULMONARY / CRITICAL CARE MEDICINE   Name: Chad Morse MRN: [id number] DOB: 1940-12-15    ADMISSION DATE:  06/10/2014 CONSULTATION DATE:  06/10/2014  REFERRING MD :  Rancour  CHIEF COMPLAINT:  Seizure  INITIAL PRESENTATION:  73 y/o male smoker with COPD and Afib who was admitted on 9/25 from the [facility] ED after he had fell at home when standing from a sitting position and struck his head.  He was briefly unresponsive afterwards so EMS was called.  He was initially conversant in the ED but then had seizure in the ED and was intubated for airway protection.  Further history could not be obtained due to intubation.  In speaking with the patient's wife, she states that he has been unwell since Labor Day, when he developed nausea and vomiting and was diagnosed with a UTI, for which he completed some antibiotics.  However, he has not been "quite right" with his GI tract since, complaining of decreased appetite, nausea, and abdominal pain.   He has chronic leg swelling and pain, which limits his home activities, so he is not very active but can walk.  He does not drive and does need some help with dressing himself.     EVENTS 9/25 CT head > No Acute Intracranial pathology 9/25 CT angio chest/ab/pelv > No evidence of thoracic or abdominal aortic aneurysm or dissection, no PE, diffuse emphysema and fibrosis of the lungs, mild gallbladder wall distention with sludge, diverticulosis without inflammatory changes 9/25 admission, seizure in ED, intubated 06/11/14: restless but purposeful on WUA off diprivan. Not on pressors. 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     SUBJECTIVE/OVERNIGHT/INTERVAL HX Agitated on WUA  low gr fever Secretions + , but good cough ++  VITAL SIGNS: Temp:  [98.5 F (36.9 C)-101.2 F (38.4 C)] 98.5 F (36.9 C) (09/29 0758) Pulse Rate:  [80-127] 127 (09/29 0915) Resp:   [18] 18 (09/29 0915) BP: (97-201)/(57-103) 201/103 mmHg (09/29 0915) SpO2:  [90 %-98 %] 95 % (09/29 0915) FiO2 (%):  [40 %-50 %] 40 % (09/29 0915) Weight:  [101.7 kg (224 lb 3.3 oz)] 101.7 kg (224 lb 3.3 oz) (09/29 0500) HEMODYNAMICS:   VENTILATOR SETTINGS: Vent Mode:  [-] PRVC FiO2 (%):  [40 %-50 %] 40 % Set Rate:  [14 bmp] 14 bmp Vt Set:  [600 mL] 600 mL PEEP:  [5 cmH20] 5 cmH20 Pressure Support:  [5 cmH20] 5 cmH20 Plateau Pressure:  [14 cmH20-19 cmH20] 19 cmH20 INTAKE / OUTPUT:  Intake/Output Summary (Last 24 hours) at 06/14/14 1030 Last data filed at 06/14/14 0941  Gross per 24 hour  Intake 2700.44 ml  Output   2885 ml  Net -184.56 ml    PHYSICAL EXAMINATION: General:  No acute distress; intubated and sedated; appears stated age Neuro:  Awakeoff drips,localizes appropriately.  No focal neuro deficits - moves all extremities to pain. Follows commands, no nystagmus HEENT:  Pupils equally round and reactive to light; sclera anicteric. Mucus membranes moist.  Cardiovascular:  RRR, no MRG. No JVD. 2+ radial pulses bilaterally. 2+ pitting edema of lower extremities bilaterally Lungs:  Coarse inspiratory breath sounds; no wheezing or crackles.  Abdomen:  Soft, nontender, nondistended. Hypoactive bowel sounds Musculoskeletal:  No joint swelling or deformities noted. Normal muscle bulk Skin:  Erythematous lower extremity skin, with changes consistent with chronic venous stasis  LABS: PULMONARY  Recent Labs Lab 06/10/14 2054 06/10/14 2057 06/10/14 2311 06/11/14 0500  PHART  --  7.442 7.362 7.476*  PCO2ART  --  51.2* 62.5* 42.8  PO2ART  --  76.0* 493.0* 96.1  HCO3  --  34.9* 35.4* 31.2*  TCO2 31 36 37 32.5  O2SAT  --  95.0 100.0 96.7    CBC  Recent Labs Lab 06/12/14 0744 06/13/14 0340 06/14/14 0347  HGB 15.5 16.8 16.1  HCT 47.2 50.5 49.3  WBC 9.8 9.3 8.8  PLT 117* PLATELETS APPEAR DECREASED 123*    COAGULATION  Recent Labs Lab 06/10/14 2036  INR 1.46     CARDIAC    Recent Labs Lab 06/10/14 2036 06/11/14 1100 06/11/14 1821 06/12/14 0154  TROPONINI <0.30 <0.30 <0.30 <0.30    Recent Labs Lab 06/12/14 0744  PROBNP 1108.0*     CHEMISTRY  Recent Labs Lab 06/10/14 2036 06/10/14 2054 06/11/14 0340 06/12/14 0744 06/13/14 0340 06/13/14 0945 06/14/14 0347  NA 140 139 140  --   --  139 140  K 3.2* 3.1* 5.1  --   --  3.2* 3.9  CL 84* 87* 92*  --   --  94* 96  CO2 30  --  26  --   --  33* 33*  GLUCOSE 169* 178* 118*  --   --  104* 109*  BUN 24* 25* 21  --   --  13 16  CREATININE 1.15 1.20 1.02  --   --  0.64 0.70  CALCIUM 6.1*  --  5.7*  --   --  6.0* 6.9*  MG  --   --   --  0.6* 1.7  --  1.7  PHOS  --   --   --  3.6 3.2  --  2.2*   Estimated Creatinine Clearance: 96.9 ml/min (by C-G formula based on Cr of 0.7).   LIVER  Recent Labs Lab 06/10/14 2036  AST 34  ALT 20  ALKPHOS 109  BILITOT 1.1  PROT 7.1  ALBUMIN 3.6  INR 1.46     INFECTIOUS  Recent Labs Lab 06/10/14 2054 06/11/14 0153 06/11/14 1100 06/12/14 0147 06/12/14 0744 06/13/14 0340  LATICACIDVEN 6.63* 2.7*  --  1.2  --   --   PROCALCITON  --   --  <0.10  --  <0.10 0.16     ENDOCRINE CBG (last 3)   Recent Labs  06/14/14 0001 06/14/14 0443 06/14/14 0757  GLUCAP 144* 149* 123*         IMAGING x48h Dg Chest Port 1 View  06/13/2014   CLINICAL DATA:  Check ETT position.  EXAM: PORTABLE CHEST - 1 VIEW  COMPARISON:  06/12/2014  FINDINGS: ET tube and NG tube are in stable position. Continued left lower lobe atelectasis or infiltrate with small left effusion. Increasing right basilar atelectasis or infiltrate. Heart is upper limits normal in size.  IMPRESSION: Stable left basilar atelectasis or infiltrate and small effusion.  Increasing right basilar atelectasis or infiltrate   Electronically Signed   By: Rolm Baptise M.D.   On: 06/13/2014 07:14      ASSESSMENT / PLAN:  PULMONARY OETT 9/25 >> A: Acute respiratory failure due to  inability to protect airway. PE ruled out CTA 06/10/14  - COPD with emphysema and ?fibrosis on CT chest (very mild appearing left lower lobe, consider aspiration).  Hgb of 22 on presentation suggests chronic hypoxia Failed extubation 9/29  P:   Extubated but failed due to upper airway secretions & mild stridor Add solumedrol 40 q 8h Scheduled duoneb and prn albuterol  CARDIOVASCULAR  CVL n/a A: Afib with baseline echo showing cor pulmonale Hypertension Peripheral vascular disease Syncope >> etiology seizure? Consider valvular disease, arrhythmia, worsening pulmonary hypertension (secondary to lung disease), and orthostasis due to dehydration    ECHO with RV dilatation and dysfunction but PE ruled out.  Likely cor pulmonale  P:  Continue home ASA Restart home eliquis and pletal on 06/12/14 (ok per neuro) Continue home metoprolol, dilt, , statin AVoid home  ace inhibitor in presence of COPD   RENAL A:   Lactic acidosis presumably from seizure at admit. REsolved 06/12/14  - Hypomag 06/12/14 (severe -0.6)  Diuresed 9/28 P:   Monitor BMET and UOP Replace electrolytes as needed   GASTROINTESTINAL A:  Diverticulosis Gallbladder sludge but no evidence of cholecystitis History of recent nausea/vomiting/abdominal pain with unclear etiology . CT abdomen/pelvis suggests cirrhosis. INR and albumin are reasonable so does not seem like decompensated liver    - nil acute on exam      - tolerating tube feeds 06/12/14. Lipase normal.  P:   Resume  tube feedings since 9/26  H2 blocker    HEMATOLOGIC A:  No acute issues P:  Monitor cbc intermittently PRBC for hgb < 7gm%  INFECTIOUS A:  Aspiration pna, multiple ABx allergies Normal PCT 9/27 blood >> neg 9/29 resp >>  9/29 Aztreonam >> 9/29 vanc >> P:   Start empiric abx given copious secretions   ENDOCRINE A:  DM2 P:   SSI q4H  NEUROLOGIC A:  Acute syncope, s/p long workup .? Seizure , remote infarcts   -Nystagmus/    Ocular "bobbing" seen 06/12/14. Neuro suspecting brain stem lesion  EEG -slowing  P:   Neuro following Continue propofol gtt for now (CK high - recheck and monitor to avoid Propofol infusion syndrome) Fentanyl prn -ok to use gt if needed RASS goal -2 Home baclofen   [facility] ppx: Famotidine DVT ppx: back on home eliquis from 06/12/14   Family updated:  06/10/14:  Discussed current status with wife Jana Half on admission. Her cell is [phone] and home is [phone] 9/29: Wife again updated at bedside   TODAY'S SUMMARY:  73 y/o male with syncope and possibly status epilepticus with pulmonary hypertension and known Afib. Has likely underlying COPD with polycythemia & mild cirrhosis (new diagnoses) Intubated for airway protection.  Failed extubation 9/29 due to upper airway issues & secretions, neurology work up for seizure/syncope. Keep intubated x 48h before next trial of extubation Wife updated     The patient is critically ill with multiple organ systems failure and requires high complexity decision making for assessment and support, frequent evaluation and titration of therapies, application of advanced monitoring technologies and extensive interpretation of multiple databases.   Critical Care Time devoted to patient care services described in this note is  90  Minutes.  Kara Mead MD. Shade Flood. [facility] Pager [phone] If no response call [phone]    06/14/2014 10:30 AM

## 2014-06-15 ENCOUNTER — Inpatient Hospital Stay (HOSPITAL_COMMUNITY): Payer: Medicare Other

## 2014-06-15 LAB — CBC WITH DIFFERENTIAL/PLATELET
BASOS ABS: 0 10*3/uL (ref 0.0–0.1)
BASOS PCT: 0 % (ref 0–1)
Eosinophils Absolute: 0 10*3/uL (ref 0.0–0.7)
Eosinophils Relative: 0 % (ref 0–5)
HCT: 47.5 % (ref 39.0–52.0)
HEMOGLOBIN: 15.6 g/dL (ref 13.0–17.0)
Lymphocytes Relative: 6 % — ABNORMAL LOW (ref 12–46)
Lymphs Abs: 0.4 10*3/uL — ABNORMAL LOW (ref 0.7–4.0)
MCH: 30.1 pg (ref 26.0–34.0)
MCHC: 32.8 g/dL (ref 30.0–36.0)
MCV: 91.5 fL (ref 78.0–100.0)
MONOS PCT: 12 % (ref 3–12)
Monocytes Absolute: 0.8 10*3/uL (ref 0.1–1.0)
NEUTROS ABS: 5.7 10*3/uL (ref 1.7–7.7)
NEUTROS PCT: 82 % — AB (ref 43–77)
Platelets: 136 10*3/uL — ABNORMAL LOW (ref 150–400)
RBC: 5.19 MIL/uL (ref 4.22–5.81)
RDW: 16.1 % — AB (ref 11.5–15.5)
WBC: 7 10*3/uL (ref 4.0–10.5)

## 2014-06-15 LAB — BASIC METABOLIC PANEL
Anion gap: 12 (ref 5–15)
BUN: 26 mg/dL — ABNORMAL HIGH (ref 6–23)
CALCIUM: 7.7 mg/dL — AB (ref 8.4–10.5)
CO2: 34 mEq/L — ABNORMAL HIGH (ref 19–32)
Chloride: 97 mEq/L (ref 96–112)
Creatinine, Ser: 0.67 mg/dL (ref 0.50–1.35)
GFR calc Af Amer: >90 mL/min (ref >90)
GFR calc non Af Amer: >90 mL/min (ref >90)
Glucose, Bld: 170 mg/dL — ABNORMAL HIGH (ref 70–99)
Potassium: 4 mEq/L (ref 3.7–5.3)
Sodium: 143 mEq/L (ref 137–147)

## 2014-06-15 LAB — GLUCOSE, CAPILLARY
GLUCOSE-CAPILLARY: 182 mg/dL — AB (ref 70–99)
GLUCOSE-CAPILLARY: 166 mg/dL — AB (ref 70–99)
Glucose-Capillary: 172 mg/dL — ABNORMAL HIGH (ref 70–99)
Glucose-Capillary: 166 mg/dL — ABNORMAL HIGH (ref 70–99)
Glucose-Capillary: 210 mg/dL — ABNORMAL HIGH (ref 70–99)

## 2014-06-15 LAB — CK: Total CK: 160 U/L (ref 7–232)

## 2014-06-15 LAB — PHOSPHORUS: PHOSPHORUS: 2.3 mg/dL (ref 2.3–4.6)

## 2014-06-15 LAB — MAGNESIUM: Magnesium: 1.9 mg/dL (ref 1.5–2.5)

## 2014-06-15 MED ORDER — FUROSEMIDE 10 MG/ML IJ SOLN
40.0000 mg | Freq: Every day | INTRAMUSCULAR | Status: DC
  Administered 2014-06-15 – 2014-06-16 (×2): 40 mg via INTRAVENOUS
  Filled 2014-06-15 (×2): qty 4

## 2014-06-15 NOTE — Progress Notes (Signed)
Subjective: Extubated yesterday, failed due to secretions.   Exam: Filed Vitals:   06/15/14 0800  BP: 107/64  Pulse: 50  Temp:   Resp:    Gen: In bed, NAD MS: Awake, alert, follows commands.  RC:VELFY, EOMI, blinks to threat bilaterally.  Motor: lifts bilateral arms against gravity, wiggles toes bilaterally.  Sensory:endorses sensation bilaterally.    Impression: 73 yo M with new onset seizures in the setting of hypomagnesemia following GI illness. There are two possible provocative factors in his case, one would be a fall with striking his head with subsequent confusion concerning for concussion. The second would be hypomagnesemia. I am not certain he will need long term AED therapty though it is reasonable to continue this for now. Calcium was critically low on admission which could also have provoked seizure activty, suspect related to low mag.   Recommendations: 1) Continue keppra for now 2) will continue to follow 3) repletion/workup of hypomag and hypocalcemia per CCM  Roland Rack, MD [facility] [phone]  If 7pm- 7am, please page neurology on call as listed in [place].

## 2014-06-15 NOTE — Progress Notes (Addendum)
PULMONARY / CRITICAL CARE MEDICINE   Name: Chad Morse MRN: [id number] DOB: 05-12-1941    ADMISSION DATE:  06/10/2014 CONSULTATION DATE:  06/10/2014  REFERRING MD :  Rancour  CHIEF COMPLAINT:  Seizure  INITIAL PRESENTATION:  73 y/o male smoker with COPD and Afib who was admitted on 9/25 from the [facility] ED after he had fell at home when standing from a sitting position and struck his head.  He was briefly unresponsive afterwards so EMS was called.  He was initially conversant in the ED but then had seizure in the ED and was intubated for airway protection.  Further history could not be obtained due to intubation.  In speaking with the patient's wife, she states that he has been unwell since Labor Day, when he developed nausea and vomiting and was diagnosed with a UTI, for which he completed some antibiotics.  However, he has not been "quite right" with his GI tract since, complaining of decreased appetite, nausea, and abdominal pain.   He has chronic leg swelling and pain, which limits his home activities, so he is not very active but can walk.  He does not drive and does need some help with dressing himself.     EVENTS 9/25 CT head > No Acute Intracranial pathology 9/25 CT angio chest/ab/pelv > No evidence of thoracic or abdominal aortic aneurysm or dissection, no PE, diffuse emphysema and fibrosis of the lungs, mild gallbladder wall distention with sludge, diverticulosis without inflammatory changes 9/25 admission, seizure in ED, intubated 06/11/14: restless but purposeful on WUA off diprivan. Not on pressors. 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 9/29 reintubated for stridor, steroids started    SUBJECTIVE/OVERNIGHT/INTERVAL HX Agitated on WUA afebrile Secretions +  VITAL SIGNS: Temp:  [97.7 F (36.5 C)-99 F (37.2 C)] 97.9 F (36.6 C) (09/30 0500) Pulse Rate:  [50-155] 50  (09/30 0800) Resp:  [15-18] 15 (09/30 0744) BP: (83-201)/(47-160) 107/64 mmHg (09/30 0800) SpO2:  [90 %-95 %] 93 % (09/30 0800) FiO2 (%):  [40 %] 40 % (09/30 0744) Weight:  [101.6 kg (223 lb 15.8 oz)] 101.6 kg (223 lb 15.8 oz) (09/30 0500) HEMODYNAMICS: CVP:  [5 mmHg-6 mmHg] 5 mmHg VENTILATOR SETTINGS: Vent Mode:  [-] PRVC FiO2 (%):  [40 %] 40 % Set Rate:  [14 bmp-15 bmp] 15 bmp Vt Set:  [600 mL] 600 mL PEEP:  [5 cmH20] 5 cmH20 Plateau Pressure:  [12 cmH20-19 cmH20] 14 cmH20 INTAKE / OUTPUT:  Intake/Output Summary (Last 24 hours) at 06/15/14 0855 Last data filed at 06/15/14 0800  Gross per 24 hour  Intake 3227.3 ml  Output   1035 ml  Net 2192.3 ml    PHYSICAL EXAMINATION: General:  No acute distress; intubated and sedated; appears stated age Neuro:  Awakeoff drips,localizes appropriately.  No focal neuro deficits - moves all extremities to pain. Follows commands, no nystagmus HEENT:  Pupils equally round and reactive to light; sclera anicteric. Mucus membranes moist.  Cardiovascular:  RRR, no MRG. No JVD. 2+ radial pulses bilaterally. 2+ pitting edema of lower extremities bilaterally Lungs:  Coarse inspiratory breath sounds; no wheezing or crackles.  Abdomen:  Soft, nontender, nondistended. Hypoactive bowel sounds Musculoskeletal:  No joint swelling or deformities noted. Normal muscle bulk Skin:  Erythematous lower extremity skin, with changes consistent with chronic venous stasis  LABS: PULMONARY  Recent Labs Lab 06/10/14 2054 06/10/14 2057 06/10/14 2311 06/11/14 0500 06/14/14 1115  PHART  --  7.442  7.362 7.476* 7.391  PCO2ART  --  51.2* 62.5* 42.8 55.6*  PO2ART  --  76.0* 493.0* 96.1 75.6*  HCO3  --  34.9* 35.4* 31.2* 32.9*  TCO2 31 36 37 32.5 34.7  O2SAT  --  95.0 100.0 96.7 94.2    CBC  Recent Labs Lab 06/13/14 0340 06/14/14 0347 06/15/14 0500  HGB 16.8 16.1 15.6  HCT 50.5 49.3 47.5  WBC 9.3 8.8 7.0  PLT PLATELETS APPEAR DECREASED 123* 136*     COAGULATION  Recent Labs Lab 06/10/14 2036  INR 1.46    CARDIAC    Recent Labs Lab 06/10/14 2036 06/11/14 1100 06/11/14 1821 06/12/14 0154  TROPONINI <0.30 <0.30 <0.30 <0.30    Recent Labs Lab 06/12/14 0744  PROBNP 1108.0*     CHEMISTRY  Recent Labs Lab 06/10/14 2036 06/10/14 2054 06/11/14 0340 06/12/14 0744 06/13/14 0340 06/13/14 0945 06/14/14 0347 06/15/14 0500  NA 140 139 140  --   --  139 140  --   K 3.2* 3.1* 5.1  --   --  3.2* 3.9  --   CL 84* 87* 92*  --   --  94* 96  --   CO2 30  --  26  --   --  33* 33*  --   GLUCOSE 169* 178* 118*  --   --  104* 109*  --   BUN 24* 25* 21  --   --  13 16  --   CREATININE 1.15 1.20 1.02  --   --  0.64 0.70  --   CALCIUM 6.1*  --  5.7*  --   --  6.0* 6.9*  --   MG  --   --   --  0.6* 1.7  --  1.7 1.9  PHOS  --   --   --  3.6 3.2  --  2.2* 2.3   Estimated Creatinine Clearance: 96.8 ml/min (by C-G formula based on Cr of 0.7).   LIVER  Recent Labs Lab 06/10/14 2036  AST 34  ALT 20  ALKPHOS 109  BILITOT 1.1  PROT 7.1  ALBUMIN 3.6  INR 1.46     INFECTIOUS  Recent Labs Lab 06/10/14 2054 06/11/14 0153 06/11/14 1100 06/12/14 0147 06/12/14 0744 06/13/14 0340  LATICACIDVEN 6.63* 2.7*  --  1.2  --   --   PROCALCITON  --   --  <0.10  --  <0.10 0.16     ENDOCRINE CBG (last 3)   Recent Labs  06/14/14 2030 06/14/14 2337 06/15/14 0352  GLUCAP 198* 182* 182*         IMAGING x48h Dg Chest Port 1 View  06/14/2014   CLINICAL DATA:  73 year old male with new line Placement. Initial encounter.  EXAM: PORTABLE CHEST - 1 VIEW  COMPARISON:  0506 hr today and earlier.  FINDINGS: Portable AP semi upright view at 0934 hrs. Stable endotracheal tube tip at the level the clavicles. New left IJ approach central line, tip projects just below the clavicle. No pneumothorax. Stable cardiac size and mediastinal contours. Enteric tube in place, courses to the left upper quadrant and tip not included. Stable  ventilation.  IMPRESSION: 1. Left IJ central line placed, tip at the lower SVC level. No pneumothorax and stable ventilation. 2. Enteric tube courses to the left upper quadrant, tip not included. 3. Stable endotracheal tube.   Electronically Signed   By: Lars Pinks M.D.   On: 06/14/2014 09:49   Dg Chest Port 1  View  06/14/2014   CLINICAL DATA:  73 year old male with chronic obstructive pulmonary disease and atrial fibrillation. Assess endotracheal tube. Subsequent encounter.  EXAM: PORTABLE CHEST - 1 VIEW  COMPARISON:  06/13/2014  FINDINGS: Mild rotation to the left. Endotracheal tube tip 3.5 cm above the carina.  Nasogastric tube courses below the diaphragm. Tip is not included on the present exam.  Heart size top-normal.  Pulmonary vascular congestion most notable centrally.  Left base consolidation minimally improved.  IMPRESSION: Endotracheal tube tip 3.5 cm above the carina.  Pulmonary vascular congestion.  Left base consolidation minimally improved.   Electronically Signed   By: Chauncey Cruel M.D.   On: 06/14/2014 07:29   Dg Abd Portable 1v  06/14/2014   CLINICAL DATA:  OG tube position.  Abdominal distention.  EXAM: PORTABLE ABDOMEN - 1 VIEW  COMPARISON:  CT 06/10/2014  FINDINGS: Enteric tube tip is in the mid stomach. Mild diffuse gaseous distention of bowel without evidence for obstruction. No supine evidence of free air.  IMPRESSION: Enteric tube tip in the mid stomach.   Electronically Signed   By: Rolm Baptise M.D.   On: 06/14/2014 09:48      ASSESSMENT / PLAN:  PULMONARY OETT 9/25 >> A: Acute respiratory failure due to inability to protect airway. PE ruled out CTA 06/10/14  - COPD with emphysema and ?fibrosis on CT chest (very mild appearing left lower lobe, consider aspiration).  Hgb of 22 on presentation suggests chronic hypoxia Failed extubation 9/29 due to stridor  P:   Extubated but failed due to upper airway secretions & mild stridor ct solumedrol 40 q 8h x 6 doses -started  9/29 Scheduled duoneb and prn albuterol  CARDIOVASCULAR CVL n/a A: Afib with baseline echo showing cor pulmonale Hypertension Peripheral vascular disease Syncope >> etiology seizure? Consider valvular disease, arrhythmia, worsening pulmonary hypertension (secondary to lung disease), and orthostasis due to dehydration    ECHO with RV dilatation and dysfunction but PE ruled out.  Likely cor pulmonale  P:  Continue home ASA Restart home eliquis and pletal on 06/12/14 (ok per neuro) Continue home metoprolol, dilt, , statin AVoid home  ace inhibitor in presence of COPD   RENAL A:   Lactic acidosis presumably from seizure at admit. REsolved 06/12/14  - Hypomag 06/12/14 (severe -0.6)  Hypocalcemia -unclear cause Diuresed 9/28 P:   Monitor BMET and UOP -lasix 40 daily Replace electrolytes as needed Chk ionised Ca May need wu such as PTH etc once acute issues resolve   GASTROINTESTINAL A:  Diverticulosis Gallbladder sludge but no evidence of cholecystitis History of recent nausea/vomiting/abdominal pain with unclear etiology . CT abdomen/pelvis suggests cirrhosis. INR and albumin are reasonable so does not seem like decompensated liver    - nil acute on exam      - tolerating tube feeds 06/12/14. Lipase normal.  P:   Resume  tube feedings since 9/26  H2 blocker    HEMATOLOGIC A:  No acute issues P:  Monitor cbc intermittently PRBC for hgb < 7gm%  INFECTIOUS A:  Aspiration pna, multiple ABx allergies Normal PCT 9/27 blood >> neg 9/29 resp >>  9/29 Aztreonam >> 9/29 vanc >> P:   Ct empiric abx given copious secretions -simplify once resp cx back   ENDOCRINE A:  DM2 P:   SSI q4H  NEUROLOGIC A:  Acute syncope, ? Seizure  Likely related to low mag/calcium, remote infarcts -not epileptogenic   - vertical Nystagmus 06/12/14.   EEG -slowing  P:  Neuro following Continue propofol gtt for now (CK high - recheck and monitor to avoid Propofol infusion  syndrome) Fentanyl prn -ok to use gt if needed RASS goal -2 Home baclofen   [facility] ppx: Famotidine DVT ppx: back on home eliquis from 06/12/14   Family updated:  06/10/14:  Discussed current status with wife Jana Half on admission. Her cell is [phone] and home is [phone] 9/30: Wife updated daily at bedside   TODAY'S SUMMARY:  73 y/o male with syncope and possibly status epilepticus with pulmonary hypertension and known Afib. 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  Failed extubation 9/29 due to upper airway issues & secretions, neurology work up for seizure/syncope. Will proceed with another trial of extubation next 24-48 h once secretions optimised   The patient is critically ill with multiple organ systems failure and requires high complexity decision making for assessment and support, frequent evaluation and titration of therapies, application of advanced monitoring technologies and extensive interpretation of multiple databases.   Critical Care Time devoted to patient care services described in this note is  35  Minutes.  Kara Mead MD. Shade Flood. [facility] Pager [phone] If no response call [phone]    06/15/2014 8:55 AM

## 2014-06-16 ENCOUNTER — Inpatient Hospital Stay (HOSPITAL_COMMUNITY): Payer: Medicare Other

## 2014-06-16 DIAGNOSIS — G9340 Encephalopathy, unspecified: Secondary | ICD-10-CM

## 2014-06-16 DIAGNOSIS — R061 Stridor: Secondary | ICD-10-CM

## 2014-06-16 DIAGNOSIS — J9601 Acute respiratory failure with hypoxia: Secondary | ICD-10-CM

## 2014-06-16 LAB — BASIC METABOLIC PANEL
Anion gap: 8 (ref 5–15)
BUN: 32 mg/dL — AB (ref 6–23)
CO2: 37 meq/L — AB (ref 19–32)
CREATININE: 0.67 mg/dL (ref 0.50–1.35)
Calcium: 8.3 mg/dL — ABNORMAL LOW (ref 8.4–10.5)
Chloride: 97 mEq/L (ref 96–112)
GFR calc Af Amer: >90 mL/min (ref >90)
GFR calc non Af Amer: >90 mL/min (ref >90)
Glucose, Bld: 218 mg/dL — ABNORMAL HIGH (ref 70–99)
Potassium: 4.2 mEq/L (ref 3.7–5.3)
Sodium: 142 mEq/L (ref 137–147)

## 2014-06-16 LAB — GLUCOSE, CAPILLARY
GLUCOSE-CAPILLARY: 191 mg/dL — AB (ref 70–99)
GLUCOSE-CAPILLARY: 209 mg/dL — AB (ref 70–99)
GLUCOSE-CAPILLARY: 175 mg/dL — AB (ref 70–99)
Glucose-Capillary: 214 mg/dL — ABNORMAL HIGH (ref 70–99)
Glucose-Capillary: 202 mg/dL — ABNORMAL HIGH (ref 70–99)
Glucose-Capillary: 171 mg/dL — ABNORMAL HIGH (ref 70–99)

## 2014-06-16 LAB — CALCIUM, IONIZED: Calcium, Ion: 1.15 mmol/L (ref 1.13–1.30)

## 2014-06-16 MED ORDER — METOPROLOL TARTRATE 12.5 MG HALF TABLET: 12.5000 mg | ORAL_TABLET | Freq: Two times a day (BID) | ORAL | Status: DC

## 2014-06-16 MED ORDER — FREE WATER
200.0000 mL | Freq: Three times a day (TID) | Status: DC
  Administered 2014-06-16 – 2014-06-17 (×3): 200 mL

## 2014-06-16 MED ORDER — FAMOTIDINE 40 MG/5ML PO SUSR
20.0000 mg | Freq: Two times a day (BID) | ORAL | Status: DC
  Filled 2014-06-16 (×2): qty 2.5

## 2014-06-16 MED ORDER — SIMVASTATIN 20 MG PO TABS: 20.0000 mg | ORAL_TABLET | Freq: Every evening | ORAL | Status: DC

## 2014-06-16 MED ORDER — FAMOTIDINE 40 MG/5ML PO SUSR
20.0000 mg | Freq: Two times a day (BID) | ORAL | Status: DC
  Administered 2014-06-16 – 2014-06-17 (×2): 20 mg
  Filled 2014-06-16 (×3): qty 2.5

## 2014-06-16 MED ORDER — APIXABAN 5 MG PO TABS
5.0000 mg | ORAL_TABLET | Freq: Two times a day (BID) | ORAL | Status: DC
  Administered 2014-06-16: 5 mg
  Filled 2014-06-16 (×3): qty 1

## 2014-06-16 MED ORDER — METOPROLOL TARTRATE 25 MG/10 ML ORAL SUSPENSION
12.5000 mg | Freq: Two times a day (BID) | ORAL | Status: DC
  Administered 2014-06-16 – 2014-06-17 (×3): 12.5 mg
  Filled 2014-06-16 (×4): qty 5

## 2014-06-16 MED ORDER — POLYETHYLENE GLYCOL 3350 17 G PO PACK
17.0000 g | PACK | Freq: Every day | ORAL | Status: DC | PRN
  Administered 2014-06-16: 17 g
  Filled 2014-06-16: qty 1

## 2014-06-16 MED ORDER — METOPROLOL TARTRATE 25 MG PO TABS: 25.0000 mg | ORAL_TABLET | Freq: Every evening | ORAL | Status: DC

## 2014-06-16 MED ORDER — METHYLPREDNISOLONE SODIUM SUCC 40 MG IJ SOLR
40.0000 mg | Freq: Two times a day (BID) | INTRAMUSCULAR | Status: DC
  Administered 2014-06-16 – 2014-06-17 (×2): 40 mg via INTRAVENOUS
  Filled 2014-06-16 (×4): qty 1

## 2014-06-16 MED ORDER — ATORVASTATIN CALCIUM 10 MG PO TABS
10.0000 mg | ORAL_TABLET | Freq: Every day | ORAL | Status: DC
  Administered 2014-06-16: 10 mg
  Filled 2014-06-16 (×2): qty 1

## 2014-06-16 NOTE — Progress Notes (Signed)
PULMONARY / CRITICAL CARE MEDICINE   Name: Chad Morse MRN: [id number] DOB: 21-Oct-1940    ADMISSION DATE:  06/10/2014 CONSULTATION DATE:  06/10/2014  REFERRING MD :  Rancour  CHIEF COMPLAINT:  Seizure  INITIAL PRESENTATION:  73 y/o male smoker with COPD and Afib who was admitted on 9/25 from the [facility] ED after he had fell at home when standing from a sitting position and struck his head.  He was briefly unresponsive afterwards so EMS was called.  He was initially conversant in the ED but then had seizure in the ED and was intubated for airway protection.  Further history could not be obtained due to intubation.  In speaking with the patient's wife, she states that he has been unwell since Labor Day, when he developed nausea and vomiting and was diagnosed with a UTI, for which he completed some antibiotics. However, he has not been "quite right" with his GI tract since, complaining of decreased appetite, nausea, and abdominal pain. He has chronic leg swelling and pain, which limits his home activities, so he is not very active but can walk.  He does not drive and does need some help with dressing himself.     EVENTS 9/25 CT head > No Acute Intracranial pathology 9/25 CT angio chest/ab/pelv > No evidence of thoracic or abdominal aortic aneurysm or dissection, no PE, diffuse emphysema and fibrosis of the lungs, mild gallbladder wall distention with sludge, diverticulosis without inflammatory changes 9/25 admission, seizure in ED, intubated 9/26: restless but purposeful on WUA off diprivan. Not on pressors. 9/26 TTE: LVEF 65-70%. RV moderately dilated. 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 9/28 repeat EEG: mod diffuse slowing 9/29 reintubated for stridor, steroids started 10/01 repeat EEG:  10/01 CT head:     SUBJECTIVE/OVERNIGHT/INTERVAL HX Tolerating PSV 5-10 cm H2O. Neuro noted R gaze preference and ordered repeat CT head, EEG  VITAL SIGNS: Temp:  [98 F (36.7 C)-98.9 F (37.2 C)] 98 F (36.7 C) (10/01 1229) Pulse Rate:  [35-116] 82 (10/01 1500) Resp:  [15-17] 17 (10/01 0422) BP: (111-181)/(51-110) 129/70 mmHg (10/01 1500) SpO2:  [91 %-95 %] 93 % (10/01 1500) FiO2 (%):  [40 %] 40 % (10/01 1456) Weight:  [101.6 kg (223 lb 15.8 oz)] 101.6 kg (223 lb 15.8 oz) (10/01 0400) HEMODYNAMICS: CVP:  [2 mmHg-5 mmHg] 5 mmHg VENTILATOR SETTINGS: Vent Mode:  [-] PRVC FiO2 (%):  [40 %] 40 % Set Rate:  [15 bmp] 15 bmp Vt Set:  [600 mL] 600 mL PEEP:  [5 cmH20] 5 cmH20 Pressure Support:  [5 cmH20-10 cmH20] 10 cmH20 Plateau Pressure:  [14 cmH20-15 cmH20] 14 cmH20 INTAKE / OUTPUT:  Intake/Output Summary (Last 24 hours) at 06/16/14 1536 Last data filed at 06/16/14 1400  Gross per 24 hour  Intake 2204.35 ml  Output   3690 ml  Net -1485.65 ml    PHYSICAL EXAMINATION: General:  NAD, RASS -2 Neuro:  MAEs, R gaze pref HEENT:  PERRL Cardiovascular: IRIR, no M Lungs: Clear Abdomen:  Soft, NT, +BS Ext: warm, no edema, chronic venous stasis changes  LABS: I have reviewed all of today's lab results. 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  Probable chronic PAH P:  Continue ASA Cont Eliquis Continue home metoprolol, dilt, statin Holding Pletal  RENAL A:    Lactic acidosis, resolved Hypomagnesemia, resolved Hypocalcemia, resolved P:   Monitor BMET intermittently Monitor I/Os Correct electrolytes as indicated  GASTROINTESTINAL A:   Diverticulosis Gallbladder sludge but no evidence of cholecystitis History of recent nausea/vomiting/abdominal pain with unclear etiology P:   SUP: enteral famotidine Cont TFs  HEMATOLOGIC A:   Polycythemia, resolved Mild thrombocytopenia P:  DVT px: arixaban  Monitor CBC intermittently Transfuse per usual ICU guidelines  INFECTIOUS A:  Aspiration suspected - no current evidence of PNA multiple ABx allergies Normal PCT 9/27 blood >> neg 9/29 resp >>NEG  9/29 Aztreonam >> 10/01 9/29 vanc >> 10/01 P:   DC abx and monitor  ENDOCRINE A:  DM2 P:   Cont SSI q4H  NEUROLOGIC A:  Acute syncope Concern for seizure Gaze preference P:   Neuro following Cont propofol and fent RASS goal -2 Daily WUA Holding baclofen   Family updated: Wife updated @ bedside   TODAY'S SUMMARY:     The patient is critically ill with multiple organ systems failure and requires high complexity decision making for assessment and support, frequent evaluation and titration of therapies, application of advanced monitoring technologies and extensive interpretation of multiple databases.   Critical Care Time devoted to patient care services described in this note is 40 Minutes.  Merton Border, MD ; [facility] [phone].  After 5:30 PM or weekends, call [phone]

## 2014-06-16 NOTE — Progress Notes (Signed)
Subjective: Continues to be agitated  Exam: Filed Vitals:   06/16/14 2000  BP: 142/66  Pulse: 70  Temp:   Resp: 15   Gen: In bed, NAD MS: Awake, alert, follows commands.  [id number],  blinks to threat bilaterally. Does not clearly track to the left of midline today.  Motor: lifts bilateral arms against gravity, wiggles toes bilaterally.  Sensory:endorses sensation bilaterally.    Impression: 73 yo M with new onset seizures in the setting of hypomagnesemia following GI illness. I suspect hypomagnesemia and hypocalcemia as etiology for his seizures. I am not certain he will need long term AED therapty though it is reasonable to continue this for now. Calcium was critically low on admission which could also have provoked seizure activty, suspect related to low mag.   I suspect his gaze is due to sedation, but will repeat CT and EEG to rule out other causes.   Keppra can sometimes be a contributor to agitation and will d/c if EEG continues to show no findings concerning for seizure predisposition.   Recommendations: 1) CT, EEG 2) will d/c keppra if EEG is negative.   Roland Rack, MD [facility] [phone]  If 7pm- 7am, please page neurology on call as listed in [place].

## 2014-06-16 NOTE — Progress Notes (Signed)
EEG completed, results pending. 

## 2014-06-16 NOTE — Procedures (Signed)
History: 73 year old male presented with seizures  Sedation: propofol  Technique: This is a 17 channel routine scalp EEG performed at the bedside with bipolar and monopolar montages arranged in accordance to the international 10/20 system of electrode placement. One channel was dedicated to EKG recording.    Background: The background consists of irregular delta activity. There are fragments of faster activity which is suspect is the posterior dominant rhythm with a frequency of 10 Hz. He does have sleep structures recorded at times.  Photic stimulation: Physiologic driving is not performed  EEG Abnormalities: 1) generalized irregular slow activity  Clinical Interpretation: This EEG is consistent with a mild nonspecific generalized cerebral dysfunction (encephalopathy). There was no seizure or seizure predisposition recorded on this study.   Roland Rack, MD [facility] [phone]  If 7pm- 7am, please page neurology on call as listed in [place].

## 2014-06-17 ENCOUNTER — Inpatient Hospital Stay (HOSPITAL_COMMUNITY): Payer: Medicare Other

## 2014-06-17 DIAGNOSIS — G40301 Generalized idiopathic epilepsy and epileptic syndromes, not intractable, with status epilepticus: Secondary | ICD-10-CM

## 2014-06-17 LAB — CULTURE, BLOOD (ROUTINE X 2)
Culture: NO GROWTH
Culture: NO GROWTH

## 2014-06-17 LAB — GLUCOSE, CAPILLARY
GLUCOSE-CAPILLARY: 109 mg/dL — AB (ref 70–99)
GLUCOSE-CAPILLARY: 142 mg/dL — AB (ref 70–99)
GLUCOSE-CAPILLARY: 189 mg/dL — AB (ref 70–99)
GLUCOSE-CAPILLARY: 225 mg/dL — AB (ref 70–99)
Glucose-Capillary: 217 mg/dL — ABNORMAL HIGH (ref 70–99)
Glucose-Capillary: 228 mg/dL — ABNORMAL HIGH (ref 70–99)
Glucose-Capillary: 153 mg/dL — ABNORMAL HIGH (ref 70–99)

## 2014-06-17 LAB — CBC
HCT: 48.9 % (ref 39.0–52.0)
Hemoglobin: 15.8 g/dL (ref 13.0–17.0)
MCH: 30 pg (ref 26.0–34.0)
MCHC: 32.3 g/dL (ref 30.0–36.0)
MCV: 92.8 fL (ref 78.0–100.0)
PLATELETS: 179 10*3/uL (ref 150–400)
RBC: 5.27 MIL/uL (ref 4.22–5.81)
RDW: 16.1 % — ABNORMAL HIGH (ref 11.5–15.5)
WBC: 7.1 10*3/uL (ref 4.0–10.5)

## 2014-06-17 LAB — CULTURE, RESPIRATORY

## 2014-06-17 LAB — BASIC METABOLIC PANEL
Anion gap: 8 (ref 5–15)
BUN: 34 mg/dL — AB (ref 6–23)
CO2: 38 mEq/L — ABNORMAL HIGH (ref 19–32)
CREATININE: 0.65 mg/dL (ref 0.50–1.35)
Calcium: 8.6 mg/dL (ref 8.4–10.5)
Chloride: 95 mEq/L — ABNORMAL LOW (ref 96–112)
GFR calc Af Amer: >90 mL/min (ref >90)
Glucose, Bld: 202 mg/dL — ABNORMAL HIGH (ref 70–99)
Potassium: 4.3 mEq/L (ref 3.7–5.3)
Sodium: 141 mEq/L (ref 137–147)

## 2014-06-17 LAB — CULTURE, RESPIRATORY W GRAM STAIN

## 2014-06-17 MED ORDER — APIXABAN 5 MG PO TABS
5.0000 mg | ORAL_TABLET | Freq: Two times a day (BID) | ORAL | Status: DC
  Administered 2014-06-17: 5 mg via ORAL
  Filled 2014-06-17 (×3): qty 1

## 2014-06-17 MED ORDER — ATORVASTATIN CALCIUM 10 MG PO TABS
10.0000 mg | ORAL_TABLET | Freq: Every day | ORAL | Status: DC
  Administered 2014-06-19 – 2014-06-20 (×2): 10 mg via ORAL
  Filled 2014-06-17 (×5): qty 1

## 2014-06-17 MED ORDER — FENTANYL CITRATE 0.05 MG/ML IJ SOLN
25.0000 ug | INTRAMUSCULAR | Status: DC | PRN
  Administered 2014-06-17 (×2): 50 ug via INTRAVENOUS
  Filled 2014-06-17 (×2): qty 2

## 2014-06-17 MED ORDER — METOPROLOL TARTRATE 1 MG/ML IV SOLN
2.5000 mg | INTRAVENOUS | Status: DC | PRN
  Administered 2014-06-17: 2.5 mg via INTRAVENOUS
  Administered 2014-06-17 – 2014-06-19 (×6): 5 mg via INTRAVENOUS
  Filled 2014-06-17 (×6): qty 5

## 2014-06-17 MED ORDER — BISACODYL 10 MG RE SUPP: 10.0000 mg | Freq: Every day | RECTAL | Status: DC | PRN

## 2014-06-17 MED ORDER — DILTIAZEM HCL 100 MG IV SOLR
5.0000 mg/h | INTRAVENOUS | Status: DC
  Administered 2014-06-17: 10 mg/h via INTRAVENOUS
  Administered 2014-06-17 – 2014-06-20 (×9): 15 mg/h via INTRAVENOUS

## 2014-06-17 MED ORDER — METOPROLOL TARTRATE 12.5 MG HALF TABLET
12.5000 mg | ORAL_TABLET | Freq: Two times a day (BID) | ORAL | Status: DC
  Administered 2014-06-19 – 2014-06-21 (×5): 12.5 mg via ORAL
  Filled 2014-06-17 (×9): qty 1

## 2014-06-17 MED ORDER — HYDRALAZINE HCL 20 MG/ML IJ SOLN
10.0000 mg | INTRAMUSCULAR | Status: DC | PRN
  Administered 2014-06-17: 10 mg via INTRAVENOUS
  Administered 2014-06-17: 20 mg via INTRAVENOUS
  Administered 2014-06-17 – 2014-06-18 (×2): 40 mg via INTRAVENOUS
  Administered 2014-06-18: 20 mg via INTRAVENOUS
  Administered 2014-06-18 (×2): 40 mg via INTRAVENOUS
  Administered 2014-06-19 – 2014-06-20 (×2): 20 mg via INTRAVENOUS
  Filled 2014-06-17: qty 2
  Filled 2014-06-17 (×2): qty 1
  Filled 2014-06-17: qty 2
  Filled 2014-06-17: qty 1
  Filled 2014-06-17 (×2): qty 2
  Filled 2014-06-17 (×3): qty 1

## 2014-06-17 MED ORDER — POTASSIUM CHLORIDE 10 MEQ/50ML IV SOLN
10.0000 meq | INTRAVENOUS | Status: AC
  Administered 2014-06-17 (×4): 10 meq via INTRAVENOUS
  Filled 2014-06-17 (×4): qty 50

## 2014-06-17 MED ORDER — METOPROLOL TARTRATE 1 MG/ML IV SOLN
INTRAVENOUS | Status: AC
Start: 2014-06-17 — End: 2014-06-17
  Filled 2014-06-17: qty 5

## 2014-06-17 MED ORDER — FENTANYL CITRATE 0.05 MG/ML IJ SOLN
12.5000 ug | INTRAMUSCULAR | Status: DC | PRN
  Administered 2014-06-17 – 2014-06-20 (×5): 12.5 ug via INTRAVENOUS
  Filled 2014-06-17 (×5): qty 2

## 2014-06-17 MED ORDER — FUROSEMIDE 10 MG/ML IJ SOLN
40.0000 mg | Freq: Once | INTRAMUSCULAR | Status: AC
  Administered 2014-06-17: 40 mg via INTRAVENOUS
  Filled 2014-06-17: qty 4

## 2014-06-17 MED ORDER — POLYETHYLENE GLYCOL 3350 17 G PO PACK
17.0000 g | PACK | Freq: Every day | ORAL | Status: DC | PRN
Start: 2014-06-17 — End: 2014-06-21
  Filled 2014-06-17: qty 1

## 2014-06-17 NOTE — Progress Notes (Signed)
PULMONARY / CRITICAL CARE MEDICINE   Name: Chad Morse MRN: [id number] DOB: 1941/03/25    ADMISSION DATE:  06/10/2014 CONSULTATION DATE:  06/10/2014  REFERRING MD :  Rancour  CHIEF COMPLAINT:  Seizure  INITIAL PRESENTATION:  73 y/o male smoker with COPD and Afib who was admitted on 9/25 from the [facility] ED after he had fell at home when standing from a sitting position and struck his head.  He was briefly unresponsive afterwards so EMS was called.  He was initially conversant in the ED but then had seizure in the ED and was intubated for airway protection.      MAJOR EVENTS/TEST RESULTS: 9/25 CT head > No Acute Intracranial pathology 9/25 CT angio chest/ab/pelv > No evidence of thoracic or abdominal aortic aneurysm or dissection, no PE, diffuse emphysema and fibrosis of the lungs, mild gallbladder wall distention with sludge, diverticulosis without inflammatory changes 9/26: restless but purposeful on WUA off diprivan. Not on pressors. 9/26 TTE: LVEF 65-70%. RV moderately dilated. 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 9/28 repeat EEG: mod diffuse slowing 9/29 reintubated for stridor, steroids started 10/01 repeat EEG: consistent with a mild nonspecific generalized cerebral dysfunction (encephalopathy). There was no seizure or seizure predisposition recorded on this study  10/01 CT head: No evidence of acute intracranial abnormality. Old, small infarcts are again noted in the right basal ganglia and left cerebellum.  10/02 Extubated. Tolerating but requiring NRB. Exhibits episodes of central apnea. Fentanyl dosing decreased. Lasix X 1 given   DEVICES:: ETT 9/25 >> 9/29, 9/29 >> 10/02 L IJ CVL 9/29 >>   MICRO: Blood 9/25 >> NEG  Resp  9/29 >> H flu  ANTIMICROBIALS:  Aztreonam 9/29 >> 10/01 Vanc 9/29 >>  10/01   SUBJECTIVE/OVERNIGHT/INTERVAL HX RASS 0 to -1. + F/C. Passed SBT. Extubated  VITAL SIGNS: Temp:  [98.3 F (36.8 C)-98.9 F (37.2 C)] 98.3 F (36.8 C) (10/02 1230) Pulse Rate:  [50-163] 72 (10/02 1400) Resp:  [15-20] 20 (10/02 0915) BP: (122-222)/(50-140) 192/92 mmHg (10/02 1400) SpO2:  [86 %-98 %] 98 % (10/02 1400) FiO2 (%):  [40 %-55 %] 55 % (10/02 1300) HEMODYNAMICS:   VENTILATOR SETTINGS: Vent Mode:  [-] PSV;PRVC FiO2 (%):  [40 %-55 %] 55 % Set Rate:  [15 bmp] 15 bmp Vt Set:  [600 mL] 600 mL PEEP:  [5 cmH20] 5 cmH20 Pressure Support:  [5 cmH20] 5 cmH20 Plateau Pressure:  [14 cmH20-15 cmH20] 14 cmH20 INTAKE / OUTPUT:  Intake/Output Summary (Last 24 hours) at 06/17/14 1501 Last data filed at 06/17/14 1421  Gross per 24 hour  Intake 2546.45 ml  Output   2625 ml  Net -78.55 ml    PHYSICAL EXAMINATION: General:  NAD, RASS 0 Neuro:  MAEs, diffusely weak HEENT:  NCAT, PERRL, EOMI Cardiovascular: IRIR, no M Lungs: Clear anteriorly Abdomen:  Soft, NT, +BS Ext: warm, no edema, chronic venous stasis changes  LABS: I have reviewed all of today's lab results. 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 Central hypoventilation - likely due to meds Suspect mild pulm edema P:   Monitor post extubation in ICU Minimize meds that might suppress resp drive Supp O2 to  maintain SpO2 92-97% Cont scheduled and PRN BDs DC systemic steroids (no stridor noted)  CARDIOVASCULAR A: Chronic AF, rate controlled Hypertension, episodically severe Hyperlipidemia Peripheral vascular disease Probable chronic PAH P:  Continue ASA Cont Eliquis Holding home PO meds Diltiazem gtt 10/02 PRN metoprolol 10/02 to maintain HR < 115/min PRN hydralazine to maintain SBP < 170  mmHg  RENAL A:   Lactic acidosis, resolved Hypomagnesemia, resolved Hypocalcemia, resolved P:   Monitor BMET intermittently Monitor I/Os Correct electrolytes as indicated  GASTROINTESTINAL A:   Diverticulosis by CT abd/pelvis Gallbladder sludge by CT abd/plevis Recent nausea/vomiting/abdominal pain, resolved P:   SUP: N/I post extubation NPO post extubation  HEMATOLOGIC A:   Polycythemia, resolved Mild thrombocytopenia, resolved P:  DVT px: arixaban (for AF) Monitor CBC intermittently Transfuse per usual ICU guidelines  INFECTIOUS A:  Aspiration suspected - no current evidence of PNA multiple ABx allergies P:   Micro and abx as above Monitoring off abx  ENDOCRINE A:  DM2 P:   Cont SSI q4H Change SSI to ACHS when diet ordered Holding metformin  NEUROLOGIC A:  Acute syncope Concern for seizure Gaze preference Chronic pain syndrome P:   Eval and mgmt per Neuro Holding baclofen   Family updated: Wife updated @ bedside   TODAY'S SUMMARY:     The patient is critically ill with multiple organ systems failure and requires high complexity decision making for assessment and support, frequent evaluation and titration of therapies, application of advanced monitoring technologies and extensive interpretation of multiple databases.   Critical Care Time devoted to patient care services described in this note is 40 Minutes.  Merton Border, MD ; [facility] [phone].  After 5:30 PM or weekends, call [phone]

## 2014-06-17 NOTE — Plan of Care (Signed)
Problem: Phase II Progression Outcomes Goal: Time pt extubated/weaned off vent Outcome: Completed/Met Date Met:  06/17/14 0915 06/17/14

## 2014-06-17 NOTE — Progress Notes (Signed)
Patient extubated and put on 50% FiO2 venti mask at this time. Tolerated well. Strong cough present. Pt's blood pressure is also elevated  at 220's/110's. Given 2.5mg  IV metoprolol and and started on diltiazem drip at 10mg /hr. Patient is alert, oriented, and relieved to have ETT removed. Family is at the bedside.

## 2014-06-17 NOTE — Progress Notes (Deleted)
PULMONARY / CRITICAL CARE MEDICINE   Name: Chad Morse MRN: [id number] DOB: [birth year]    ADMISSION DATE:  06/10/2014 CONSULTATION DATE:  06/10/2014  REFERRING MD :  Rancour  CHIEF COMPLAINT:  Seizure  INITIAL PRESENTATION:  73 y/o male smoker with COPD and Afib who was admitted on 9/25 from the [facility] ED after he had fell at home when standing from a sitting position and struck his head.  He was briefly unresponsive afterwards so EMS was called.  He was initially conversant in the ED but then had seizure in the ED and was intubated for airway protection.  Further history could not be obtained due to intubation.  In speaking with the patient's wife, she states that he has been unwell since Labor Day, when he developed nausea and vomiting and was diagnosed with a UTI, for which he completed some antibiotics. However, he has not been "quite right" with his GI tract since, complaining of decreased appetite, nausea, and abdominal pain. He has chronic leg swelling and pain, which limits his home activities, so he is not very active but can walk.  He does not drive and does need some help with dressing himself.     EVENTS 9/25 CT head > No Acute Intracranial pathology 9/25 CT angio chest/ab/pelv > No evidence of thoracic or abdominal aortic aneurysm or dissection, no PE, diffuse emphysema and fibrosis of the lungs, mild gallbladder wall distention with sludge, diverticulosis without inflammatory changes 9/25 admission, seizure in ED, intubated 9/26: restless but purposeful on WUA off diprivan. Not on pressors. 9/26 TTE: LVEF 65-70%. RV moderately dilated. 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 9/28 repeat EEG: mod diffuse slowing 9/29 reintubated for stridor, steroids started 10/01 repeat EEG: consistent with a mild nonspecific generalized  cerebral dysfunction (encephalopathy). There was no seizure or seizure predisposition recorded on this study 10/01 CT head: No evidence of acute intracranial abnormality. Old, small infarcts are again noted in the right basal ganglia and left cerebellum.  10/2: extubated    SUBJECTIVE/OVERNIGHT/INTERVAL HX Extubated, no complaints of pain. Hypertensive.   VITAL SIGNS: Temp:  [98 F (36.7 C)-98.9 F (37.2 C)] 98.4 F (36.9 C) (10/02 0730) Pulse Rate:  [50-91] 91 (10/02 0843) Resp:  [15] 15 (10/02 0406) BP: (122-214)/(50-98) 206/80 mmHg (10/02 0805) SpO2:  [91 %-97 %] 96 % (10/02 0843) FiO2 (%):  [40 %] 40 % (10/02 0800) HEMODYNAMICS:   VENTILATOR SETTINGS: Vent Mode:  [-] PSV;PRVC FiO2 (%):  [40 %] 40 % Set Rate:  [15 bmp] 15 bmp Vt Set:  [600 mL] 600 mL PEEP:  [5 cmH20] 5 cmH20 Pressure Support:  [5 cmH20-10 cmH20] 5 cmH20 Plateau Pressure:  [14 cmH20-15 cmH20] 14 cmH20 INTAKE / OUTPUT:  Intake/Output Summary (Last 24 hours) at 06/17/14 2637 Last data filed at 06/17/14 0800  Gross per 24 hour  Intake 3075.85 ml  Output   3000 ml  Net  75.85 ml    PHYSICAL EXAMINATION: General:  NAD, RASS 0 Neuro:  Oriented to self, place. MAEs. [place]. Intermittently agitated. HEENT:  PERRL Cardiovascular: IRIR, no M, hypertensive Lungs: Diminished, exp wheeze. Coughing up thick secretions. Abdomen:  Soft, NT, +BS Ext: warm, no edema, chronic venous stasis changes  LABS: I have reviewed all of today's lab results. 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  Probable chronic PAH P:  Continue ASA Cont Eliquis Continue home metoprolol, dilt, statin Holding Pletal Dilt gtt for HR <90 PRN metoprolol for HR >90 PRN hydralazine for BP >160  RENAL A:   Lactic acidosis, resolved Hypomagnesemia, resolved Hypocalcemia, resolved P:   Monitor BMET intermittently Monitor I/Os Correct electrolytes as indicated  GASTROINTESTINAL A:   Diverticulosis Gallbladder sludge but no evidence of cholecystitis History of recent nausea/vomiting/abdominal pain with unclear etiology P:   SUP: enteral famotidine NPO now, will consider speech therapy consult tomorrow if needed  HEMATOLOGIC A:   Polycythemia, resolved Mild thrombocytopenia, resolved P:  DVT px: arixaban  Monitor CBC intermittently Transfuse per usual ICU guidelines  INFECTIOUS A:  Aspiration suspected - no current evidence of PNA multiple ABx allergies Normal PCT 9/27 blood >> neg 9/29 resp >>NEG  9/29 Aztreonam >> 10/01 9/29 vanc >> 10/01 P:   Continue monitoring  ENDOCRINE A:  DM2 P:   Cont SSI q4H  NEUROLOGIC A:  Acute syncope Concern for seizure Gaze preference P:   Neuro following RASS goal 0 DC prop/fent PRN oxycodone for pain  Family updated: Wife updated @ bedside   TODAY'S SUMMARY:  Extubated this AM.    The patient is critically ill with multiple organ systems failure and requires high complexity decision making for assessment and support, frequent evaluation and titration of therapies, application of advanced monitoring technologies and extensive interpretation of multiple databases.

## 2014-06-17 NOTE — Procedures (Signed)
**Note De-Identified Jannessa Ogden Obfuscation** Extubation Procedure Note  Patient Details:   Name: Chad Morse DOB: 1941-04-16 MRN: [id number]   Airway Documentation:     Evaluation  O2 sats: stable throughout Complications: No apparent complications Patient did tolerate procedure well. Bilateral Breath Sounds: Clear;Diminished Suctioning: Airway Yes + leak, no stridor noted [name], [name] 06/17/2014, 10:21 AM

## 2014-06-17 NOTE — Progress Notes (Signed)
Subjective: No significant changes, ct and EEG negative  Exam: Filed Vitals:   06/17/14 1000  BP: 208/131  Pulse: 95  Temp:   Resp:    Gen: In bed, NAD MS: Awake, alert, follows commands.  QI:ONGEX,  blinks to threat bilaterally. Continues having difficulty looking to the left Motor: lifts bilateral arms against gravity, moves left arm, but not as well as right.  Sensory:endorses sensation bilaterally.    Impression: 73 yo M with new onset seizures in the setting of hypomagnesemia following GI illness. I suspect hypomagnesemia and hypocalcemia as etiology for his seizures. I am not certain he will need long term AED therapty though it is reasonable to continue this for now. Calcium was critically low on admission which could also have provoked seizure activty, suspect related to low mag.   Keppra can sometimes be a contributor to agitation and have d/ced.  His decreased movemetn on the left was apparently present even prior to his MRI per nursing. If he continues to have problems once extubated then MRI to rule out stroke would be indicated.   Recommendations: 1) will reassess once extubated 2) will continue apixaban 3) please call if any concerns before extubation or if intubation will be prolonged.    Roland Rack, MD [facility] [phone]  If 7pm- 7am, please page neurology on call as listed in [place].

## 2014-06-18 ENCOUNTER — Inpatient Hospital Stay (HOSPITAL_COMMUNITY): Payer: Medicare Other

## 2014-06-18 DIAGNOSIS — I482 Chronic atrial fibrillation: Secondary | ICD-10-CM

## 2014-06-18 DIAGNOSIS — J690 Pneumonitis due to inhalation of food and vomit: Secondary | ICD-10-CM

## 2014-06-18 DIAGNOSIS — J449 Chronic obstructive pulmonary disease, unspecified: Secondary | ICD-10-CM

## 2014-06-18 LAB — GLUCOSE, CAPILLARY
GLUCOSE-CAPILLARY: 175 mg/dL — AB (ref 70–99)
Glucose-Capillary: 122 mg/dL — ABNORMAL HIGH (ref 70–99)
Glucose-Capillary: 144 mg/dL — ABNORMAL HIGH (ref 70–99)
Glucose-Capillary: 265 mg/dL — ABNORMAL HIGH (ref 70–99)
Glucose-Capillary: 144 mg/dL — ABNORMAL HIGH (ref 70–99)

## 2014-06-18 LAB — BASIC METABOLIC PANEL
Anion gap: 9 (ref 5–15)
BUN: 26 mg/dL — AB (ref 6–23)
CALCIUM: 9 mg/dL (ref 8.4–10.5)
CHLORIDE: 94 meq/L — AB (ref 96–112)
CO2: 37 mEq/L — ABNORMAL HIGH (ref 19–32)
CREATININE: 0.63 mg/dL (ref 0.50–1.35)
GFR calc non Af Amer: >90 mL/min (ref >90)
Glucose, Bld: 138 mg/dL — ABNORMAL HIGH (ref 70–99)
Potassium: 3.9 mEq/L (ref 3.7–5.3)
Sodium: 140 mEq/L (ref 137–147)

## 2014-06-18 MED ORDER — BISACODYL 10 MG RE SUPP
10.0000 mg | Freq: Once | RECTAL | Status: AC
  Administered 2014-06-18: 10 mg via RECTAL
  Filled 2014-06-18: qty 1

## 2014-06-18 MED ORDER — ENOXAPARIN SODIUM 100 MG/ML ~~LOC~~ SOLN
1.0000 mg/kg | Freq: Two times a day (BID) | SUBCUTANEOUS | Status: DC
  Administered 2014-06-18 – 2014-06-20 (×4): 100 mg via SUBCUTANEOUS
  Filled 2014-06-18 (×6): qty 1

## 2014-06-18 MED ORDER — DEXTROSE 5 % IV SOLN
1.0000 g | Freq: Three times a day (TID) | INTRAVENOUS | Status: DC
  Administered 2014-06-18 – 2014-06-21 (×10): 1 g via INTRAVENOUS
  Filled 2014-06-18 (×13): qty 1

## 2014-06-18 MED ORDER — SODIUM CHLORIDE 0.9 % IJ SOLN: 10.0000 mL | INTRAMUSCULAR | Status: DC | PRN

## 2014-06-18 MED ORDER — SODIUM CHLORIDE 0.9 % IJ SOLN
10.0000 mL | Freq: Two times a day (BID) | INTRAMUSCULAR | Status: DC
  Administered 2014-06-18 – 2014-06-20 (×3): 10 mL

## 2014-06-18 MED ORDER — FUROSEMIDE 10 MG/ML IJ SOLN
40.0000 mg | Freq: Every day | INTRAMUSCULAR | Status: DC
  Administered 2014-06-18 – 2014-06-19 (×2): 40 mg via INTRAVENOUS
  Filled 2014-06-18 (×3): qty 4

## 2014-06-18 NOTE — Progress Notes (Signed)
Pt to MRI, tolerated well, VS stable

## 2014-06-18 NOTE — Progress Notes (Signed)
Fentanyl  gtt 75 ml  wasted in sink by Melene Plan RN and Fayrene Fearing RN

## 2014-06-18 NOTE — Progress Notes (Signed)
PULMONARY / CRITICAL CARE MEDICINE   Name: Chad Morse MRN: [id number] DOB: 1940-12-15    ADMISSION DATE:  06/10/2014 CONSULTATION DATE:  06/10/2014  REFERRING MD :  Rancour  CHIEF COMPLAINT:  Seizure  INITIAL PRESENTATION:  73 y/o male smoker with COPD and Afib who was admitted on 9/25 from the [facility] ED after he had fell at home when standing from a sitting position and struck his head.  He was briefly unresponsive afterwards so EMS was called.  He was initially conversant in the ED but then had seizure in the ED and was intubated for airway protection.      MAJOR EVENTS/TEST RESULTS: 9/25 CT head > No Acute Intracranial pathology 9/25 CT angio chest/ab/pelv > No evidence of thoracic or abdominal aortic aneurysm or dissection, no PE, diffuse emphysema and fibrosis of the lungs, mild gallbladder wall distention with sludge, diverticulosis without inflammatory changes 9/26: restless but purposeful on WUA off diprivan. Not on pressors. 9/26 TTE: LVEF 65-70%. RV moderately dilated. 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 9/28 repeat EEG: mod diffuse slowing 9/29 reintubated for stridor, steroids started 10/01 repeat EEG: consistent with a mild nonspecific generalized cerebral dysfunction (encephalopathy). There was no seizure or seizure predisposition recorded on this study  10/01 CT head: No evidence of acute intracranial abnormality. Old, small infarcts are again noted in the right basal ganglia and left cerebellum.  10/02 Extubated. Tolerating but requiring NRB. Exhibits episodes of central apnea. Fentanyl dosing decreased. Lasix X 1 given   DEVICES:: ETT 9/25 >> 9/29, 9/29 >> 10/02 L IJ CVL 9/29 >>   MICRO: Blood 9/25 >> NEG  Resp  9/29 >> H flu  ANTIMICROBIALS:  Aztreonam 9/29 >> 10/01 Vanc 9/29 >>  10/01   SUBJECTIVE/OVERNIGHT/INTERVAL HX RASS 0  On venti mask , in bed Afebrile No BM Denies pain 'I take hydrocodone daily'  VITAL SIGNS: Temp:  [98 F (36.7 C)-98.7 F (37.1 C)] 98.5 F (36.9 C) (10/03 0728) Pulse Rate:  [52-163] 84 (10/03 0728) Resp:  [16-22] 22 (10/03 0728) BP: (152-222)/(59-140) 152/93 mmHg (10/03 0728) SpO2:  [85 %-100 %] 95 % (10/03 0728) FiO2 (%):  [40 %-80 %] 55 % (10/03 0728) HEMODYNAMICS:   VENTILATOR SETTINGS: Vent Mode:  [-] PSV;PRVC FiO2 (%):  [40 %-80 %] 55 % PEEP:  [5 cmH20] 5 cmH20 Pressure Support:  [5 cmH20] 5 cmH20 INTAKE / OUTPUT:  Intake/Output Summary (Last 24 hours) at 06/18/14 0759 Last data filed at 06/18/14 0700  Gross per 24 hour  Intake 1024.13 ml  Output   4930 ml  Net -3905.87 ml    PHYSICAL EXAMINATION: General:  NAD, RASS 0 Neuro:  MAEs, diffusely weak HEENT:  NCAT, PERRL, EOMI Cardiovascular: IRIR, no M Lungs: Clear anteriorly Abdomen:  Soft, NT, +BS Ext: warm, no edema, chronic venous stasis changes  LABS: I have reviewed all of today's lab results. 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 Central hypoventilation - likely due to meds H flu pna, Suspect mild pulm edema P:   Minimize meds that might suppress resp drive Supp O2 to maintain SpO2 92-97% Cont scheduled and PRN BDs DC systemic steroids (no stridor noted)  CARDIOVASCULAR A: Chronic AF, rate controlled Hypertension, episodically severe Hyperlipidemia Peripheral vascular disease Probable chronic PAH P:  Continue ASA Cont Eliquis Holding home PO meds Diltiazem gtt 10/02 -change to PO once able PRN metoprolol 10/02 to maintain HR < 115/min PRN hydralazine to maintain SBP < 170 mmHg  RENAL A:   Lactic acidosis,  resolved Hypomagnesemia, resolved Hypocalcemia, resolved P:   Monitor BMET intermittently Monitor I/Os Correct electrolytes as indicated  GASTROINTESTINAL A:   Diverticulosis by CT abd/pelvis Gallbladder sludge by CT abd/plevis Recent nausea/vomiting/abdominal pain, resolved Constipation P:   SUP: N/I post extubation Swallow eval -then advance Dulcolax prn  HEMATOLOGIC A:   Polycythemia, resolved Mild thrombocytopenia, resolved P:  DVT px: arixaban (for AF) Monitor CBC intermittently Transfuse per usual ICU guidelines  INFECTIOUS A:  Aspiration suspected - H flu pnaPNA multiple ABx allergies P:   Micro and abx as above Resume azactam until 10/5  ENDOCRINE A:  DM2 P:   Cont SSI q4H Change SSI to ACHS when diet ordered Holding metformin  NEUROLOGIC A:  Acute syncope Concern for seizure Gaze preference Chronic pain syndrome P:   Eval and mgmt per Neuro - MRI planned Holding baclofen   Family updated: Wife updated @ bedside   TODAY'S SUMMARY:     The patient is critically ill with multiple organ systems failure and requires high complexity decision making for assessment and support, frequent evaluation and titration of therapies, application of advanced monitoring technologies and extensive interpretation of multiple databases.   Critical Care Time devoted to patient care services described in this note is 31 Minutes.  Kara Mead MD. Shade Flood. [facility] Pager [phone] If no response call [phone]

## 2014-06-18 NOTE — Progress Notes (Signed)
Pt has no bipap at this time. Rt will continue to monitor.

## 2014-06-18 NOTE — Evaluation (Signed)
Clinical/Bedside Swallow Evaluation Patient Details  Name: Chad Morse MRN: [id number] Date of Birth: Feb 10, 1941  Today's Date: 06/18/2014 Time: 2595-6387 SLP Time Calculation (min): 21 min  Past Medical History:  Past Medical History  Diagnosis Date  . COPD (chronic obstructive pulmonary disease)   . Diabetes mellitus   . Atrial fibrillation   . Peripheral neuropathy   . Gallstones   . Peripheral vascular disease   . IBS (irritable bowel syndrome)   . Peripheral arterial disease   . Tobacco abuse   . Hypertension   . Hyperlipidemia    Past Surgical History:  Past Surgical History  Procedure Laterality Date  . Back surgery     HPI:  73 y/o male with PMH: COPD, Afib, DM, PVD, IBS, HTN admitted after fall, struck his head with brief unresponsiveness.  Had seizure in ED and was intubated.  Per MD note pt has had very poor PO intake and has complained of his vision being blurry.  CT negative for new abnormality, remote right basal ganglia and left cerebelluym.  Extubated 9/29 and reintubaed 9/29-10/02. MRI ordered. CXR cardiomegaly with bilateral pulmonary infiltrates and small pleural effusions most consistent with congestive heart failure.    Assessment / Plan / Recommendation Clinical Impression  Pt. demonstrated immediate evidence of aspiration (cough, throat clear, multiple swalllows) with ice chip, thin and puree.  Dysphagia appears reversible and due to intubation x 2 and questionable neuro involvement (MRI pending).  Educated pt. and wife on results and recommendations; remain NPO, occassional ice chip after oral care with continued ST for improvements.     Aspiration Risk   (mod-severe)    Diet Recommendation NPO;Ice chips PRN after oral care        Other  Recommendations Oral Care Recommendations: Oral care BID   Follow Up Recommendations   (TBD)    Frequency and Duration min 2x/week  2 weeks   Pertinent Vitals/Pain No indications pain          Swallow Study         Oral/Motor/Sensory Function Overall Oral Motor/Sensory Function: Appears within functional limits for tasks assessed   Ice Chips Ice chips: Impaired Presentation: Spoon Oral Phase Impairments:  (none) Pharyngeal Phase Impairments: Suspected delayed Swallow   Thin Liquid Thin Liquid: Impaired Presentation: Cup Oral Phase Impairments:  (none) Pharyngeal  Phase Impairments: Cough - Immediate;Change in Vital Signs;Multiple swallows;Throat Clearing - Delayed (decreased sats)    Nectar Thick Nectar Thick Liquid: Not tested   Honey Thick Honey Thick Liquid: Not tested   Puree Puree: Impaired Presentation: Spoon Oral Phase Impairments:  (none) Pharyngeal Phase Impairments: Multiple swallows;Cough - Delayed   Solid   GO    Solid: Not tested       [name] 06/18/2014,9:51 AM  [name].Ed [employer] [phone]

## 2014-06-18 NOTE — Progress Notes (Signed)
Subjective: Much improved   Exam: Filed Vitals:   06/18/14 0924  BP: 196/90  Pulse: 107  Temp:   Resp:    Gen: In bed, NAD MS: Awake, alert, follows commands.  YQ:MVHQI,  Visual fields full, extinguishes to double visual stimuli Motor: 5/5 thoruhgout Sensory:endorses sensation bilaterally. Extinguishes to double simultaneous stimulation.    Impression: 73 yo M with new onset seizures in the setting of hypomagnesemia following GI illness. I suspect hypomagnesemia and hypocalcemia as etiology for his seizures. I am not certain he will need long term AED therapty though it is reasonable to continue this for now. Calcium was critically low on admission which could also have provoked seizure activty, suspect related to low mag.   Keppra can sometimes be a contributor to agitation and have d/ced.  His movement is better, but he still has some findings concerning for a mild neglect.   Recommendations: 1) MRI brain 2) continue apixaban.    Roland Rack, MD [facility] [phone]  If 7pm- 7am, please page neurology on call as listed in [place].

## 2014-06-19 LAB — GLUCOSE, CAPILLARY
GLUCOSE-CAPILLARY: 144 mg/dL — AB (ref 70–99)
GLUCOSE-CAPILLARY: 152 mg/dL — AB (ref 70–99)
GLUCOSE-CAPILLARY: 201 mg/dL — AB (ref 70–99)
Glucose-Capillary: 131 mg/dL — ABNORMAL HIGH (ref 70–99)
Glucose-Capillary: 144 mg/dL — ABNORMAL HIGH (ref 70–99)
Glucose-Capillary: 163 mg/dL — ABNORMAL HIGH (ref 70–99)
Glucose-Capillary: 213 mg/dL — ABNORMAL HIGH (ref 70–99)

## 2014-06-19 LAB — MAGNESIUM: Magnesium: 2 mg/dL (ref 1.5–2.5)

## 2014-06-19 LAB — BASIC METABOLIC PANEL
Anion gap: 14 (ref 5–15)
BUN: 28 mg/dL — ABNORMAL HIGH (ref 6–23)
CO2: 34 meq/L — AB (ref 19–32)
CREATININE: 0.64 mg/dL (ref 0.50–1.35)
Calcium: 8.8 mg/dL (ref 8.4–10.5)
Chloride: 96 mEq/L (ref 96–112)
GFR calc Af Amer: >90 mL/min (ref >90)
Glucose, Bld: 159 mg/dL — ABNORMAL HIGH (ref 70–99)
Potassium: 3.2 mEq/L — ABNORMAL LOW (ref 3.7–5.3)
Sodium: 144 mEq/L (ref 137–147)

## 2014-06-19 LAB — CBC
HEMATOCRIT: 56.1 % — AB (ref 39.0–52.0)
Hemoglobin: 18.7 g/dL — ABNORMAL HIGH (ref 13.0–17.0)
MCH: 29.7 pg (ref 26.0–34.0)
MCHC: 33.3 g/dL (ref 30.0–36.0)
MCV: 89.2 fL (ref 78.0–100.0)
PLATELETS: 237 10*3/uL (ref 150–400)
RBC: 6.29 MIL/uL — ABNORMAL HIGH (ref 4.22–5.81)
RDW: 16.1 % — AB (ref 11.5–15.5)
WBC: 15.2 10*3/uL — ABNORMAL HIGH (ref 4.0–10.5)

## 2014-06-19 LAB — PHOSPHORUS: Phosphorus: 2.6 mg/dL (ref 2.3–4.6)

## 2014-06-19 MED ORDER — FLEET ENEMA 7-19 GM/118ML RE ENEM
1.0000 | ENEMA | Freq: Once | RECTAL | Status: AC | PRN
  Filled 2014-06-19: qty 1

## 2014-06-19 MED ORDER — POTASSIUM CHLORIDE 10 MEQ/50ML IV SOLN
10.0000 meq | INTRAVENOUS | Status: AC
  Administered 2014-06-19 (×4): 10 meq via INTRAVENOUS
  Filled 2014-06-19 (×4): qty 50

## 2014-06-19 MED ORDER — BISACODYL 10 MG RE SUPP: 10.0000 mg | Freq: Every day | RECTAL | Status: DC | PRN

## 2014-06-19 NOTE — Progress Notes (Signed)
Subjective: Much improved   Exam: Filed Vitals:   06/19/14 1000  BP: 160/83  Pulse:   Temp:   Resp:    Gen: In bed, NAD MS: Awake, alert, follows commands.  KG:SUPJS,  Visual fields full, no extinction Motor: 5/5 thoruhgout Sensory:endorses sensation bilaterally. No extinction.   MRI negative.   Impression: 73 yo M with new onset seizures in the setting of hypomagnesemia following GI illness. I suspect hypomagnesemia and hypocalcemia as etiology for his seizures. hedoes not need long term AED therapy. Calcium was critically low on admission which could also have provoked seizure activty, suspect related to low mag.   Keppra can sometimes be a contributor to agitation and have d/ced.  It is unclear what cause him to have some extinction, but it has improved along with his delirium. It lasted for several days, so no concerns for TIA.   Recommendations: 1) No further recommendations at this time. Neurology will sign off.  2) continue apixaban.    Roland Rack, MD [facility] [phone]  If 7pm- 7am, please page neurology on call as listed in [place].

## 2014-06-19 NOTE — Progress Notes (Signed)
Speech Language Pathology Treatment: Dysphagia  Patient Details Name: KHALIK PEWITT MRN: [id number] DOB: 03/19/1941 Today's Date: 06/19/2014 Time: 7829-5621 SLP Time Calculation (min): 12 min  Assessment / Plan / Recommendation Clinical Impression  Diagnostic treatment complete at bedside. Skilled observation of swallowing function complete.Patient with continued indication of a moderate sensory-motor based oropharyngeal dysphagia with clinician provided po trials characterized by prolonged oral phase with solids, suspected delayed swallow initiation, and multiple swallows suggestive of decreased pharyngeal clearance. S/s of decreased airway protection noted across liquid consistencies making diet advancement unsafe at this time however feel that patient is ready for instrumental testing. Recommend NPO except ice chips after oral care and necessary meds crushed in puree. Will plan for MBS 10/5 am.    HPI HPI: 73 y/o male with PMH: COPD, Afib, DM, PVD, IBS, HTN admitted after fall, struck his head with brief unresponsiveness.  Had seizure in ED and was intubated.  Per MD note pt has had very poor PO intake and has complained of his vision being blurry.  CT negative for new abnormality, remote right basal ganglia and left cerebelluym.  Extubated 9/29 and reintubaed 9/29-10/02. MRI ordered. CXR cardiomegaly with bilateral pulmonary infiltrates and small pleural effusions most consistent with congestive heart failure.    Pertinent Vitals Pain Assessment: No/denies pain  SLP Plan  MBS    Recommendations Diet recommendations: NPO (except ice chips after oral care and meds crushed in puree) Medication Administration: Crushed with puree              Oral Care Recommendations:  (QID) Follow up Recommendations:  (TBD) Plan: MBS    GO   Gabriel Rainwater [place], CCC-SLP [phone]   Cierra Rothgeb Meryl 06/19/2014, 3:06 PM

## 2014-06-19 NOTE — Progress Notes (Signed)
[facility] ADULT ICU REPLACEMENT PROTOCOL FOR AM LAB REPLACEMENT ONLY  The patient does apply for the [facility] Adult ICU Electrolyte Replacment Protocol based on the criteria listed below:   1. Is GFR >/= 40 ml/min? Yes.    Patient's GFR today is >90 2. Is urine output >/= 0.5 ml/kg/hr for the last 6 hours? Yes.   Patient's UOP is 0.9 ml/kg/hr 3. Is BUN < 60 mg/dL? Yes.    Patient's BUN today is 28 4. Abnormal electrolyte(s): K+3.2 5. Ordered repletion with: protocol 6. If a panic level lab has been reported, has the CCM MD in charge been notified? No..   Physician:  Nicanor Bake [facility] 06/19/2014 5:34 AM

## 2014-06-19 NOTE — Evaluation (Signed)
Physical Therapy Evaluation Patient Details Name: Chad Morse MRN: [id number] DOB: 1941-06-08 Today's Date: 06/19/2014   History of Present Illness  73 y/o male with PMH: COPD, Afib, DM, PVD, IBS, HTN admitted after fall, struck his head with brief unresponsiveness.  Had seizure in ED and was intubated.  Per MD note pt has had very poor PO intake and has complained of his vision being blurry.  CT negative for new abnormality, remote right basal ganglia and left cerebelluym.  Extubated 9/29 and reintubaed 9/29-10/02. MRI ordered. CXR cardiomegaly with bilateral pulmonary infiltrates and small pleural effusions most consistent with congestive heart failure.   Clinical Impression   Pt admitted with above. Pt currently with functional limitations due to the deficits listed below (see PT Problem List).  Pt will benefit from skilled PT to increase their independence and safety with mobility to allow discharge to the venue listed below.       Follow Up Recommendations CIR    Equipment Recommendations  None recommended by PT (at this point)    Recommendations for Other Services OT consult;Rehab consult     Precautions / Restrictions Restrictions Weight Bearing Restrictions: No      Mobility  Bed Mobility Overal bed mobility: Needs Assistance Bed Mobility: Supine to Sit     Supine to sit: +2 for physical assistance;Max assist     General bed mobility comments: Cues to initiate and for technique; max assist to elevate trunk and square off hips at EOB  Transfers Overall transfer level: Needs assistance Equipment used: 2 person hand held assist (and bil trunk/hip support at belt) Transfers: Stand Pivot Transfers   Stand pivot transfers: Max assist;+2 physical assistance       General transfer comment: Pt anxious during transfer to chair (placed on his R side); Required bil knee blocking for stability; he yelled out during transfer, but later said it was from being scared, not  from pain  Ambulation/Gait                Stairs            Wheelchair Mobility    Modified Rankin (Stroke Patients Only)       Balance Overall balance assessment: Needs assistance Sitting-balance support: Bilateral upper extremity supported;Feet supported Sitting balance-Leahy Scale: Poor Sitting balance - Comments: support at back for balance                                     Pertinent Vitals/Pain Pain Assessment: 0-10 Pain Score: 5  Pain Location: stomach Pain Descriptors / Indicators:  (Hurting) Pain Intervention(s): Monitored during session;Repositioned    Home Living Family/patient expects to be discharged to:: Inpatient rehab Living Arrangements: Spouse/significant other               Additional Comments: has a lift chair; uses rollator RW; up till the two weeks prior to this admission, pt and wife went out to dinner 3x/week    Prior Function Level of Independence: Independent with assistive device(s)               Hand Dominance        Extremity/Trunk Assessment   Upper Extremity Assessment: Defer to OT evaluation;Generalized weakness           Lower Extremity Assessment: Generalized weakness         Communication   Communication: No difficulties  Cognition Arousal/Alertness: Awake/alert Behavior  During Therapy: WFL for tasks assessed/performed Overall Cognitive Status: Impaired/Different from baseline Area of Impairment: Orientation Orientation Level: Disoriented to;Place;Situation;Time             General Comments: Gave his own street address when asked where we are    General Comments      Exercises        Assessment/Plan    PT Assessment Patient needs continued PT services  PT Diagnosis Difficulty walking;Generalized weakness   PT Problem List Decreased strength;Decreased activity tolerance;Decreased balance;Decreased mobility;Decreased coordination;Decreased cognition;Decreased  knowledge of use of DME;Decreased safety awareness;Pain;Decreased knowledge of precautions  PT Treatment Interventions DME instruction;Gait training;Functional mobility training;Therapeutic activities;Therapeutic exercise;Balance training;Patient/family education   PT Goals (Current goals can be found in the Care Plan section) Acute Rehab PT Goals Patient Stated Goal: Unable to state PT Goal Formulation: With patient/family Time For Goal Achievement: 06/29/2014 Potential to Achieve Goals: Good    Frequency Min 3X/week   Barriers to discharge        Co-evaluation               End of Session Equipment Utilized During Treatment: Gait belt Activity Tolerance: Patient tolerated treatment well (thoguh anxious with transfer) Patient left: in chair;with call bell/phone within reach;with chair alarm set;with family/visitor present Nurse Communication: Mobility status         Time: 4680-3212 PT Time Calculation (min): 20 min   Charges:   PT Evaluation $Initial PT Evaluation Tier I: 1 Procedure PT Treatments $Therapeutic Activity: 8-22 mins   PT G Codes:          Quin Hoop 06/19/2014, 10:23 AM  Roney Marion, PT  Acute Rehabilitation Services Pager [phone] Office [phone]

## 2014-06-19 NOTE — Progress Notes (Signed)
PULMONARY / CRITICAL CARE MEDICINE   Name: Chad Morse MRN: [id number] DOB: 08/03/1941    ADMISSION DATE:  06/10/2014 CONSULTATION DATE:  06/10/2014  REFERRING MD :  Rancour  CHIEF COMPLAINT:  Seizure  INITIAL PRESENTATION:  73 y/o male smoker with COPD and Afib who was admitted on 9/25 from the [facility] ED after he had fell at home when standing from a sitting position and struck his head.  He was briefly unresponsive afterwards so EMS was called.  He was initially conversant in the ED but then had seizure in the ED and was intubated for airway protection.      MAJOR EVENTS/TEST RESULTS: 9/25 CT head > No Acute Intracranial pathology 9/25 CT angio chest/ab/pelv > No evidence of thoracic or abdominal aortic aneurysm or dissection, no PE, diffuse emphysema and fibrosis of the lungs, mild gallbladder wall distention with sludge, diverticulosis without inflammatory changes 9/26: restless but purposeful on WUA off diprivan. Not on pressors. 9/26 TTE: LVEF 65-70%. RV moderately dilated. 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 9/28 repeat EEG: mod diffuse slowing 9/29 reintubated for stridor, steroids started 10/01 repeat EEG: consistent with a mild nonspecific generalized cerebral dysfunction (encephalopathy). There was no seizure or seizure predisposition recorded on this study  10/01 CT head: No evidence of acute intracranial abnormality. Old, small infarcts are again noted in the right basal ganglia and left cerebellum.  10/02 Extubated. Tolerating but requiring NRB. Exhibits episodes of central apnea. Fentanyl dosing decreased. Lasix X 1 given 10/3 rpt MRI neg   DEVICES:: ETT 9/25 >> 9/29, 9/29 >> 10/02 L IJ CVL 9/29 >>   MICRO: Blood 9/25 >> NEG  Resp  9/29 >> H flu  ANTIMICROBIALS:  Aztreonam 9/29 >> 10/01 Vanc 9/29 >>  10/01   SUBJECTIVE/OVERNIGHT/INTERVAL HX Down to [place] Afebrile No BM Denies pain Mild confused  VITAL SIGNS: Temp:  [98.5 F (36.9 C)-99.2 F (37.3 C)] 98.5 F (36.9 C) (10/04 0724) Pulse Rate:  [50-130] 106 (10/03 1634) Resp:  [20-22] 20 (10/04 0724) BP: (144-195)/(77-139) 144/98 mmHg (10/04 0800) SpO2:  [88 %-95 %] 95 % (10/04 0800) FiO2 (%):  [40 %-45 %] 45 % (10/03 2000) HEMODYNAMICS:   VENTILATOR SETTINGS: Vent Mode:  [-]  FiO2 (%):  [40 %-45 %] 45 % INTAKE / OUTPUT:  Intake/Output Summary (Last 24 hours) at 06/19/14 1028 Last data filed at 06/19/14 1008  Gross per 24 hour  Intake   1095 ml  Output   3650 ml  Net  -2555 ml    PHYSICAL EXAMINATION: General:  NAD, RASS 0 Neuro:  MAEs, diffusely weak HEENT:  NCAT, PERRL, EOMI Cardiovascular: IRIR, no M Lungs: Clear anteriorly Abdomen:  Soft, NT, +BS Ext: warm, no edema, chronic venous stasis changes  LABS: I have reviewed all of today's lab results. 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 H flu pna, Suspect mild pulm edema P:   Minimize meds that might suppress resp drive Supp O2 to maintain SpO2 92-97% Cont scheduled and PRN BDs   CARDIOVASCULAR A: Chronic AF, rate controlled Hypertension, episodically severe Hyperlipidemia Peripheral vascular disease Probable chronic PAH P:  Continue ASA Cont  Eliquis Holding home PO meds Diltiazem gtt 10/02 -change to PO once clears swallow PRN metoprolol 10/02 to maintain HR < 115/min PRN hydralazine to maintain SBP < 170 mmHg  RENAL A:   Lactic acidosis, resolved Hypomagnesemia, resolved Hypocalcemia, resolved -treated aggressively since ? causing seizures P:   Monitor BMET intermittently Monitor I/Os Correct  electrolytes as indicated  GASTROINTESTINAL A:   Diverticulosis by CT abd/pelvis Gallbladder sludge by CT abd/plevis Recent nausea/vomiting/abdominal pain, resolved Constipation -no BM since admit P:   SUP: N/I post extubation Swallow eval -then advance Dulcolax prn, enema prn  HEMATOLOGIC A:   Polycythemia, resolved Mild thrombocytopenia, resolved P:  DVT px: arixaban (for AF) Monitor CBC intermittently Transfuse per usual ICU guidelines  INFECTIOUS A:  Aspiration suspected - H flu pnaPNA multiple ABx allergies P:   Micro and abx as above Resume azactam until 10/5  ENDOCRINE A:  DM2 P:   Cont SSI q4H Change SSI to ACHS when diet ordered Holding metformin  NEUROLOGIC A:  Acute syncope Concern for seizure Gaze preference -resolved Chronic pain syndrome P:   Eval and mgmt per Neuro Holding baclofen   Family updated: Wife updated @ bedside   TODAY'S SUMMARY:  Much improved, will need CIR for deconditioning, resume PO meds once clears swallow -expect dysphagia to improve  OK to transfer to SDU   The patient is critically ill with multiple organ systems failure and requires high complexity decision making for assessment and support, frequent evaluation and titration of therapies, application of advanced monitoring technologies and extensive interpretation of multiple databases.   Critical Care Time devoted to patient care services described in this note is 31 Minutes.  Kara Mead MD. Shade Flood. [facility] Pager [phone] If no response call [phone]

## 2014-06-20 ENCOUNTER — Inpatient Hospital Stay (HOSPITAL_COMMUNITY): Payer: Medicare Other

## 2014-06-20 DIAGNOSIS — R198 Other specified symptoms and signs involving the digestive system and abdomen: Secondary | ICD-10-CM

## 2014-06-20 DIAGNOSIS — D62 Acute posthemorrhagic anemia: Secondary | ICD-10-CM

## 2014-06-20 DIAGNOSIS — R5381 Other malaise: Secondary | ICD-10-CM

## 2014-06-20 DIAGNOSIS — G629 Polyneuropathy, unspecified: Secondary | ICD-10-CM

## 2014-06-20 LAB — MAGNESIUM: Magnesium: 1.9 mg/dL (ref 1.5–2.5)

## 2014-06-20 LAB — BASIC METABOLIC PANEL
Anion gap: 11 (ref 5–15)
BUN: 28 mg/dL — ABNORMAL HIGH (ref 6–23)
CALCIUM: 8.8 mg/dL (ref 8.4–10.5)
CO2: 34 meq/L — AB (ref 19–32)
CREATININE: 0.67 mg/dL (ref 0.50–1.35)
Chloride: 98 mEq/L (ref 96–112)
GFR calc Af Amer: >90 mL/min (ref >90)
GFR calc non Af Amer: >90 mL/min (ref >90)
GLUCOSE: 152 mg/dL — AB (ref 70–99)
Potassium: 3 mEq/L — ABNORMAL LOW (ref 3.7–5.3)
Sodium: 143 mEq/L (ref 137–147)

## 2014-06-20 LAB — GLUCOSE, CAPILLARY
GLUCOSE-CAPILLARY: 182 mg/dL — AB (ref 70–99)
GLUCOSE-CAPILLARY: 204 mg/dL — AB (ref 70–99)
Glucose-Capillary: 153 mg/dL — ABNORMAL HIGH (ref 70–99)
Glucose-Capillary: 137 mg/dL — ABNORMAL HIGH (ref 70–99)
Glucose-Capillary: 192 mg/dL — ABNORMAL HIGH (ref 70–99)

## 2014-06-20 LAB — CBC
HCT: 57 % — ABNORMAL HIGH (ref 39.0–52.0)
HEMOGLOBIN: 19.1 g/dL — AB (ref 13.0–17.0)
MCH: 29.8 pg (ref 26.0–34.0)
MCHC: 33.5 g/dL (ref 30.0–36.0)
MCV: 88.9 fL (ref 78.0–100.0)
Platelets: 222 10*3/uL (ref 150–400)
RBC: 6.41 MIL/uL — ABNORMAL HIGH (ref 4.22–5.81)
RDW: 15.9 % — ABNORMAL HIGH (ref 11.5–15.5)
WBC: 19.4 10*3/uL — ABNORMAL HIGH (ref 4.0–10.5)

## 2014-06-20 LAB — PHOSPHORUS: Phosphorus: 2.8 mg/dL (ref 2.3–4.6)

## 2014-06-20 MED ORDER — APIXABAN 5 MG PO TABS
5.0000 mg | ORAL_TABLET | Freq: Two times a day (BID) | ORAL | Status: DC
  Administered 2014-06-20 – 2014-06-21 (×2): 5 mg via ORAL
  Filled 2014-06-20 (×3): qty 1

## 2014-06-20 MED ORDER — FUROSEMIDE 40 MG PO TABS
40.0000 mg | ORAL_TABLET | Freq: Two times a day (BID) | ORAL | Status: DC
  Administered 2014-06-20 – 2014-06-21 (×2): 40 mg via ORAL
  Filled 2014-06-20 (×4): qty 1

## 2014-06-20 MED ORDER — INSULIN ASPART 100 UNIT/ML ~~LOC~~ SOLN
0.0000 [IU] | Freq: Three times a day (TID) | SUBCUTANEOUS | Status: DC
  Administered 2014-06-20: 3 [IU] via SUBCUTANEOUS
  Administered 2014-06-20 – 2014-06-21 (×2): 2 [IU] via SUBCUTANEOUS
  Administered 2014-06-21: 1 [IU] via SUBCUTANEOUS

## 2014-06-20 MED ORDER — RESOURCE THICKENUP CLEAR PO POWD
ORAL | Status: DC | PRN
  Filled 2014-06-20: qty 125

## 2014-06-20 MED ORDER — DILTIAZEM HCL 60 MG PO TABS
60.0000 mg | ORAL_TABLET | Freq: Four times a day (QID) | ORAL | Status: DC
  Administered 2014-06-20 – 2014-06-21 (×5): 60 mg via ORAL
  Filled 2014-06-20 (×8): qty 1

## 2014-06-20 MED ORDER — POTASSIUM CHLORIDE 10 MEQ/50ML IV SOLN
10.0000 meq | INTRAVENOUS | Status: AC
  Administered 2014-06-20 (×6): 10 meq via INTRAVENOUS
  Filled 2014-06-20 (×5): qty 50

## 2014-06-20 NOTE — Progress Notes (Signed)
[facility] ADULT ICU REPLACEMENT PROTOCOL FOR AM LAB REPLACEMENT ONLY  The patient does not apply for the [facility] Adult ICU Electrolyte Replacment Protocol based on the criteria listed below:   1. Is GFR >/= 40 ml/min? Yes.    Patient's GFR today is >90 2. Is urine output >/= 0.5 ml/kg/hr for the last 6 hours? No. Patient's UOP is 0.3 ml/kg/hr 3. Is BUN < 60 mg/dL? Yes.    Patient's BUN today is 28 4. Abnormal electrolyte(s):K+3.0 5. Ordered repletion with: NA 6. If a panic level lab has been reported, has the CCM MD in charge been notified? Yes.  .   Physician:  E Deterding  Concepc[facility] Living [facility] 06/20/2014 5:42 AM

## 2014-06-20 NOTE — Progress Notes (Signed)
eLink Physician-Brief Progress Note Patient Name: Chad Morse DOB: 1940/11/05 MRN: [id number]   Date of Service  06/20/2014  HPI/Events of Note  Hypokalemia  eICU Interventions  Potassium replaced     Intervention Category Intermediate Interventions: Electrolyte abnormality - evaluation and management  DETERDING,ELIZABETH 06/20/2014, 5:45 AM

## 2014-06-20 NOTE — Progress Notes (Signed)
NUTRITION FOLLOW-UP  DOCUMENTATION CODES Per approved criteria  -Severe malnutrition in the context of acute illness or injury   Pt meets criteria for SEVERE MALNUTRITION in the context of ACUTE ILLNESS as evidenced by estimated energy intake <50% of estimated energy needs for > 5 days and 13% weight loss in less than 3 months.  INTERVENTION:   NUTRITION DIAGNOSIS: Inadequate oral intake related to dysphagia as evidenced by altered texture diet; ongoing.   Goal: Pt to meet >/= 90% of their estimated nutrition needs, not met.  Monitor:  Diet advancement, PO intake, supplement acceptance, weight trend, Labs  ASSESSMENT: 73 y/o male with no past history of seizure disorder was admitted on 9/25 after syncope and an apparent seizure at home. He had another seizure in the ED and was intubated for airway protection. Gallbladder sludge but no evidence of cholecystitis  Pt extubated 10/2. Pt started PO diet 10/5.  Potassium low.   Per pt he has only ate a few bites today. Pt asking for water. Pt willing to try magic cups.   Height: Ht Readings from Last 1 Encounters:  06/14/14 5' 11"  (1.803 m)    Weight: Wt Readings from Last 1 Encounters:  06/16/14 223 lb 15.8 oz (101.6 kg)  Admission weight: 205 lb (93 kg) 9/25 - BMI 28.6  BMI:  Body mass index is 31.25 kg/(m^2).  Estimated Nutritional Needs: Kcal: 1940 Protein: 120-140 grams Fluid: 2.5 L/day  Skin: non-pitting RLE and LLE edema  Diet Order: Dysphagia 1 with Pudding-thickened liquids    Intake/Output Summary (Last 24 hours) at 06/20/14 1518 Last data filed at 06/20/14 1400  Gross per 24 hour  Intake   1080 ml  Output   1050 ml  Net     30 ml    Last BM: 10/5   Labs:   Recent Labs Lab 06/15/14 0500  06/18/14 0900 06/19/14 0407 06/20/14 0400  NA 143  < > 140 144 143  K 4.0  < > 3.9 3.2* 3.0*  CL 97  < > 94* 96 98  CO2 34*  < > 37* 34* 34*  BUN 26*  < > 26* 28* 28*  CREATININE 0.67  < > 0.63 0.64 0.67   CALCIUM 7.7*  < > 9.0 8.8 8.8  MG 1.9  --   --  2.0 1.9  PHOS 2.3  --   --  2.6 2.8  GLUCOSE 170*  < > 138* 159* 152*  < > = values in this interval not displayed.  CBG (last 3)   Recent Labs  06/20/14 0403 06/20/14 0747 06/20/14 1148  GLUCAP 153* 137* 182*    Scheduled Meds: . antiseptic oral rinse  7 mL Mouth Rinse QID  . apixaban  5 mg Oral BID  . atorvastatin  10 mg Oral q1800  . aztreonam  1 g Intravenous Q8H  . chlorhexidine  15 mL Mouth Rinse BID  . diltiazem  60 mg Oral 4 times per day  . furosemide  40 mg Oral BID  . insulin aspart  0-9 Units Subcutaneous TID WC  . ipratropium-albuterol  3 mL Nebulization Q6H  . metoprolol tartrate  12.5 mg Oral BID  . sodium chloride  10-40 mL Intracatheter Q12H    Continuous Infusions: . sodium chloride 10 mL/hr at 06/14/14 [place], LDN, CNSC [phone] Pager [phone] After Hours Pager

## 2014-06-20 NOTE — Evaluation (Signed)
Occupational Therapy Evaluation Patient Details Name: Chad Morse MRN: [id number] DOB: [birth year] Today's Date: 06/20/2014    History of Present Illness 73 y/o male with PMH: COPD, Afib, DM, PVD, IBS, HTN admitted after fall, struck his head with brief unresponsiveness.  Had seizure in ED and was intubated.  Per MD note pt has had very poor PO intake and has complained of his vision being blurry.  CT negative for new abnormality, remote right basal ganglia and left cerebelluym.  Extubated 9/29 and reintubaed 9/29-10/02. MRI ordered. CXR cardiomegaly with bilateral pulmonary infiltrates and small pleural effusions most consistent with congestive heart failure.    Clinical Impression   Prior to admission, pt was reliant on a lift chair and RW for mobility.  He showered using DME and his wife assisted him with LB dressing and drying his feet. Pt now presents with generalized weakness and impaired balance with decreased activity tolerance.  He requires 2 person assist for all mobility and is dependent in ADL to varying degrees. Pt will need post acute rehab prior to return home.  Will follow acutely.    Follow Up Recommendations  CIR    Equipment Recommendations       Recommendations for Other Services       Precautions / Restrictions Precautions Precautions: Fall Restrictions Weight Bearing Restrictions: No      Mobility Bed Mobility Overal bed mobility: Needs Assistance Bed Mobility: Supine to Sit;Sit to Supine     Supine to sit: +2 for physical assistance;Max assist Sit to supine: +2 for physical assistance;Max assist      Transfers Overall transfer level: Needs assistance                    Balance     Sitting balance-Leahy Scale: Poor Sitting balance - Comments: needed at least one hand on bed for balance                                    ADL Overall ADL's : Needs assistance/impaired Eating/Feeding: Supervision/ safety;Bed  level Eating/Feeding Details (indicate cue type and reason): pt on pureed food with thickened liquid, wants water but understands it must be thickened. Grooming: Dance movement psychotherapist;Wash/dry hands;Set up;Sitting   Upper Body Bathing: Moderate assistance;Sitting   Lower Body Bathing: Total assistance;Sit to/from stand   Upper Body Dressing : Moderate assistance;Sitting   Lower Body Dressing: Total assistance;Sit to/from stand               Functional mobility during ADLs: +2 for physical assistance;Moderate assistance       Vision                     Perception     Praxis      Pertinent Vitals/Pain Pain Assessment:  (chronic pain from neuropathy)     Hand Dominance Right   Extremity/Trunk Assessment Upper Extremity Assessment Upper Extremity Assessment: RUE deficits/detail;LUE deficits/detail RUE Deficits / Details: generalized weakness RUE Sensation: history of peripheral neuropathy (reports chronic pain, but sensation intact) LUE Deficits / Details: generalized weakness LUE Sensation: history of peripheral neuropathy (reports chronic pain, but intact sensation)   Lower Extremity Assessment Lower Extremity Assessment: Defer to PT evaluation       Communication Communication Communication: No difficulties   Cognition Arousal/Alertness: Awake/alert Behavior During Therapy: WFL for tasks assessed/performed Overall Cognitive Status: No family/caregiver present to determine baseline cognitive functioning  General Comments       Exercises       Shoulder Instructions      Home Living Family/patient expects to be discharged to:: Inpatient rehab Living Arrangements: Spouse/significant other                               Additional Comments: has a lift chair; uses rollator RW; up till the two weeks prior to this admission, pt and wife went out to dinner 3x/week      Prior Functioning/Environment Level of  Independence: Needs assistance    ADL's / Homemaking Assistance Needed: wife assisted with socks and starting pants over feet, pt used back brush for feet.        OT Diagnosis: Generalized weakness;Cognitive deficits;Disturbance of vision   OT Problem List: Decreased strength;Decreased activity tolerance;Impaired balance (sitting and/or standing);Impaired vision/perception;Decreased cognition;Decreased knowledge of use of DME or AE;Impaired sensation   OT Treatment/Interventions: Self-care/ADL training;Therapeutic exercise;DME and/or AE instruction;Therapeutic activities;Patient/family education;Balance training;Cognitive remediation/compensation    OT Goals(Current goals can be found in the care plan section) Acute Rehab OT Goals Patient Stated Goal: be able to get around and to bathe myself OT Goal Formulation: With patient Time For Goal Achievement: 07/04/14 Potential to Achieve Goals: Good  OT Frequency: Min 2X/week   Barriers to D/C:            Co-evaluation              End of Session Nurse Communication: Mobility status  Activity Tolerance: Patient limited by fatigue Patient left: in bed;with call bell/phone within reach   Time: 1500-1520 OT Time Calculation (min): 20 min Charges:  OT General Charges $OT Visit: 1 Procedure OT Evaluation $Initial OT Evaluation Tier I: 1 Procedure OT Treatments $Self Care/Home Management : 8-22 mins G-Codes:    Malka So 06/20/2014, 3:54 PM [phone]

## 2014-06-20 NOTE — Progress Notes (Signed)
ANTICOAGULATION/Antibiotic CONSULT NOTE - Follow Up Consult  Pharmacy Consult for Apixaban & Aztreonam Indication: atrial fibrillation; HFlu [place]  Allergies  Allergen Reactions  . Ciprofloxacin Hives  . Penicillins Hives  . Sulfonamide Derivatives Hives    Patient Measurements: Height: 5\' 11"  (180.3 cm) Weight: 223 lb 15.8 oz (101.6 kg) IBW/kg (Calculated) : 75.3  Vital Signs: Temp: 98.5 F (36.9 C) (10/05 0745) Temp Source: Oral (10/05 0745) BP: 120/97 mmHg (10/05 0745)  Labs:  Recent Labs  06/18/14 0900 06/19/14 0407 06/20/14 0400  HGB  --  18.7* 19.1*  HCT  --  56.1* 57.0*  PLT  --  237 222  CREATININE 0.63 0.64 0.67    Estimated Creatinine Clearance: 96.8 ml/min (by C-G formula based on Cr of 0.67).   Medications:  Prescriptions prior to admission  Medication Sig Dispense Refill  . allopurinol (ZYLOPRIM) 100 MG tablet Take 100 mg by mouth daily.      Marland Kitchen ALPRAZolam (XANAX) 1 MG tablet Take 1 mg by mouth daily as needed for anxiety or sleep.       Marland Kitchen apixaban (ELIQUIS) 5 MG TABS tablet Take 1 tablet (5 mg total) by mouth 2 (two) times daily.  60 tablet  3  . baclofen (LIORESAL) 20 MG tablet Take 10-20 mg by mouth 2 (two) times daily. Take 10 mg morning, take 20 mg noon, 10 mg at 6 pm and 20 mg 11 pm.      . cilostazol (PLETAL) 50 MG tablet Take 50 mg by mouth 2 (two) times daily.       Marland Kitchen diltiazem (CARDIZEM CD) 120 MG 24 hr capsule Take 1 capsule (120 mg total) by mouth daily.  90 capsule  2  . folic acid (FOLVITE) 1 MG tablet Take 1 mg by mouth daily.      . Folic Acid-Vit R9-FMB W46 (FOLBEE) 2.5-25-1 MG TABS tablet Take 1 tablet by mouth daily.  90 tablet  2  . furosemide (LASIX) 40 MG tablet Take 40 mg by mouth daily.      Marland Kitchen HYDROcodone-acetaminophen (NORCO) 10-325 MG per tablet Take 1 tablet by mouth 3 (three) times daily. For pain      . KLOR-CON M20 20 MEQ tablet Take 20 mEq by mouth daily.       . metFORMIN (GLUCOPHAGE) 500 MG tablet Take 500 mg by  mouth 2 (two) times daily.       . metoprolol (LOPRESSOR) 50 MG tablet Take 25 mg by mouth every evening.       . mupirocin cream (BACTROBAN) 2 % Apply 1 application topically as needed (for skin irritation).       . ondansetron (ZOFRAN) 4 MG tablet Take 1 tablet (4 mg total) by mouth every 6 (six) hours.  12 tablet  0  . pantoprazole (PROTONIX) 40 MG tablet Take 1 tablet (40 mg total) by mouth daily.  90 tablet  3  . ramipril (ALTACE) 2.5 MG capsule Take 1 capsule (2.5 mg total) by mouth daily.  90 capsule  2  . simvastatin (ZOCOR) 20 MG tablet Take 1 tablet (20 mg total) by mouth every evening.  30 tablet  6  . SPIRIVA HANDIHALER 18 MCG inhalation capsule Place 18 mcg into inhaler and inhale daily as needed (for shortness of breath).       Marland Kitchen tiZANidine (ZANAFLEX) 4 MG tablet Take 2-4 mg by mouth 4 (four) times daily. 6 am- half tab 11 am- 1 tab 6 pm- half tab 11 pm- 2  tabs      . traZODone (DESYREL) 50 MG tablet Take 100 mg by mouth at bedtime.       Marland Kitchen zolpidem (AMBIEN CR) 12.5 MG CR tablet Take 12.5 mg by mouth daily.      . cefUROXime (CEFTIN) 250 MG tablet Take 1 tablet (250 mg total) by mouth 2 (two) times daily with a meal.  14 tablet  0    Assessment: 73 yo M admitted 06/10/2014 syncope - fell hit head > sz in ER vs seizure.  Pharmacy consulted to dose anticoagulation for afib and adjust antibiotics for HFlu pneumonia.  PMH: COPD, DM, afib, PVD, IBS, HTN, HLD  Events: s/p barium swallow,   Coag: afib: H/H; apixaban 5mg  bid dose ok (was on pta for afib) - CHADSVASC = 5, transition back to apixiban pm 10/5  ID: HFlu PNA, afebrile, WBC still trending up. steroids stopped 10/1, CXR 9/29 Pulmonary vascular congestion and left base consolidation minimally improved; 10/2 Cxr pulm edema  Azactam 9/29 >>10/1 restart 10/3>  (to end am 10/7), total 7 days Vanc 9/29 >>10/1  9/25 blood x2: ng, final 9/29 resp: few H.flu  Cardiovascular: undergoing syncope w/u - ECHO showed EF 65-70% and  no valvular abnormality - may need RHC/EP eval, BP 180-120s (more 180s) /90 HR 80s Afib; On atorva 10, furo 40 iv daily, metop PO, dilt drip 15, ramipril held d/t current lung dz  Endocrinology: CBGs < 200; on SSI, solu medrol stopped 10/1  Gastrointestinal / Nutrition: famotidine for GI px - stopped now extubated - NPO for impaired swallowing  Neurology: new seizures - CT head negative, MRI-No acute intracranial abnormality or mass, Mild chronic small vessel ischemic disease and cerebral atrophy.. Neuro suspecting brain stem lesion. Now off propofol (trig= 136 on 9/29) - neuro thinks Sz in setting of low Mag/Ca after GI illness - keppra d/c EEG neg no plan for long term AED Home baclofen resumed  Nephrology: CrCl ~95-100 ml/min stable, K 3> replaced, Mg =1.9 Pulmonary: extubated 10/2 4l [place]  PTA Medication Issues: folic acid, lasix, kdur, metformin  Best Practices: apix   Goal of Therapy:  Full dose anticoagulation with apixaban Monitor platelets by anticoagulation protocol: Yes Renal adjustment of antibiotics.  Plan:  1. DC enoxaparin, start apixiban 5 mg PO bid 2. Continue aztreonam at current dose and stop 10/7 am for a total of 7 days  Thank you for allowing pharmacy to be a part of this patients care team.  Rowe Robert Pharm.D., BCPS, AQ-Cardiology Clinical Pharmacist 06/20/2014 10:33 AM Pager: [phone] Phone: [phone]

## 2014-06-20 NOTE — Procedures (Addendum)
Objective Swallowing Evaluation: Modified Barium Swallowing Study  Patient Details  Name: Chad Morse MRN: [id number] Date of Birth: [birth year]  Today's Date: 06/20/2014 Time: 0905-0925 SLP Time Calculation (min): 20 min  Past Medical History:  Past Medical History  Diagnosis Date  . COPD (chronic obstructive pulmonary disease)   . Diabetes mellitus   . Atrial fibrillation   . Peripheral neuropathy   . Gallstones   . Peripheral vascular disease   . IBS (irritable bowel syndrome)   . Peripheral arterial disease   . Tobacco abuse   . Hypertension   . Hyperlipidemia    Past Surgical History:  Past Surgical History  Procedure Laterality Date  . Back surgery     HPI:  73 y/o male with PMH: COPD, Afib, DM, PVD, IBS, HTN admitted after fall, struck his head with brief unresponsiveness.  Had seizure in ED and was intubated.  Per MD note pt has had very poor PO intake and has complained of his vision being blurry.  CT negative for new abnormality, remote right basal ganglia and left cerebelluym.  Extubated 9/29 and reintubaed 9/29-10/02. MRI revealed chronic changes as described. No acute intracranial findingsordered. CXR cardiomegaly with bilateral pulmonary infiltrates and small pleural effusions most consistent with congestive heart failure.  Pt. continues to exhibit pharyngeal dysphagia, therefore, MBS recommended.      Assessment / Plan / Recommendation Clinical Impression  Dysphagia Diagnosis: Mild oral phase dysphagia;Moderate pharyngeal phase dysphagia;Severe pharyngeal phase dysphagia Clinical impression: Pt. exhibited mild oral dysphagia with decreased mastication and delayed transit with solid texture.  Moderate-severe pharyngeal dysphagia affected by an anatomical source due to what appears to be significant boney osteophytes.  Epiglottis is unable to fully cover laryngeal vestibule due to osteophytes encroaching into pharyngeal space resulting in penetration onto vocal  cords with reflexive cough.  Current illness with intubation x 2 prevents him from naturally compensating as he had prior to admission.  Prognosis appears good as he gains strength and endurance.  Vallecular and pyfirom sinus residue present with penetration once from residuals.  Therapeutic intervention attempted with chin tuck without success with textures thinner than puree.  Dys 1, pudding thick liquids recommended with double swallows and intentional coughs/throat clears.     Treatment Recommendation  Therapy as outlined in treatment plan below    Diet Recommendation Dysphagia 1 (Puree);Pudding-thick liquid (nothing thinner than puree)   Medication Administration: Crushed with puree Supervision: Patient able to self feed;Full supervision/cueing for compensatory strategies Compensations: Slow rate;Small sips/bites;Clear throat intermittently;Clear throat after each swallow;Multiple dry swallows after each bite/sip Postural Changes and/or Swallow Maneuvers: Seated upright 90 degrees;Upright 30-60 min after meal    Other  Recommendations Oral Care Recommendations: Oral care BID Other Recommendations: Order thickener from pharmacy   Follow Up Recommendations   (TBD)    Frequency and Duration min 2x/week  2 weeks   Pertinent Vitals/Pain No pain          Reason for Referral Objectively evaluate swallowing function   Oral Phase Oral Preparation/Oral Phase Oral Phase: Impaired Oral - Solids Oral - Regular: Delayed oral transit;Reduced posterior propulsion   Pharyngeal Phase Pharyngeal Phase Pharyngeal Phase: Impaired Pharyngeal - Honey Pharyngeal - Honey Teaspoon: Penetration/Aspiration during swallow;Delayed swallow initiation;Premature spillage to valleculae;Reduced epiglottic inversion;Pharyngeal residue - valleculae;Pharyngeal residue - pyriform sinuses;Reduced tongue base retraction;Reduced laryngeal elevation Penetration/Aspiration details (honey teaspoon): Material enters  airway, CONTACTS cords and not ejected out (reflexive throat clear) Pharyngeal - Honey Cup: Penetration/Aspiration during swallow;Pharyngeal residue - valleculae;Pharyngeal residue -  pyriform sinuses;Reduced laryngeal elevation;Reduced epiglottic inversion;Reduced airway/laryngeal closure (with chin tuck) Penetration/Aspiration details (honey cup): Material enters airway, remains ABOVE vocal cords and not ejected out Pharyngeal - Nectar Pharyngeal - Nectar Teaspoon: Penetration/Aspiration during swallow;Reduced epiglottic inversion;Pharyngeal residue - valleculae;Pharyngeal residue - pyriform sinuses;Reduced tongue base retraction;Reduced laryngeal elevation (chin tuck ineffective) Penetration/Aspiration details (nectar teaspoon): Material enters airway, CONTACTS cords and not ejected out Pharyngeal - Nectar Cup: Not tested Pharyngeal - Thin Pharyngeal - Thin Teaspoon: Penetration/Aspiration during swallow;Pharyngeal residue - valleculae;Pharyngeal residue - pyriform sinuses;Reduced tongue base retraction;Reduced laryngeal elevation;Reduced epiglottic inversion Penetration/Aspiration details (thin teaspoon): Material enters airway, CONTACTS cords and not ejected out (reflexive cough) Pharyngeal - Solids Pharyngeal - Puree: Delayed swallow initiation;Premature spillage to valleculae Pharyngeal - Regular: Pharyngeal residue - valleculae;Pharyngeal residue - pyriform sinuses;Reduced tongue base retraction;Reduced laryngeal elevation  Cervical Esophageal Phase    GO    Cervical Esophageal Phase Cervical Esophageal Phase: Chad Morse         Chad Morse 06/20/2014, 10:01 AM  Chad Morse [employer] [phone]

## 2014-06-20 NOTE — Consult Note (Signed)
Physical Medicine and Rehabilitation Consult Reason for Consult: Multi-medical/seizure Referring Physician: Critical care   HPI: Chad Morse is a 73 y.o. right-handed male with history of COPD, atrial fibrillation on Eliquis and seizure disorder. Patient lives with his wife uses a rolling walker prior to admission. Admitted 06/11/2014 after a fall at home when standing from a sitting position and struck his head. There was reports of brief unresponsiveness. He was initially conversant in the EEG and had a seizure requiring intubation for airway protection. Cranial CT scan negative for acute abnormalities and followup MRI negative for acute changes. CT angiogram chest negative. Patient initially loaded with Keppra later discontinued secondary to agitation felt this was possibly contributing to his restlessness. No further seizure activity noted. Suspect hypomagnesemia as well as hypocalcemia possibly related to his seizures. EEG with moderate diffuse swelling no seizure activity noted generalized cerebral dysfunction suspect encephalopathy. Patient's Eliquis to be resumed for history of atrial fibrillation. All cardiac markers remained negative. Patient was extubated 06/17/2014. Physical therapy evaluation completed 06/19/2014 with recommendations of physical medicine rehabilitation consult.  Patient on Cardizem drip, modified barium swallow pending. Currently n.p.o. for meds. Foley discontinued Per RN, occasional bowel incontinence, patient not aware Review of Systems  Cardiovascular: Positive for palpitations.  Gastrointestinal:       GERD  Musculoskeletal: Positive for myalgias.  Neurological: Positive for seizures.   Past Medical History  Diagnosis Date  . COPD (chronic obstructive pulmonary disease)   . Diabetes mellitus   . Atrial fibrillation   . Peripheral neuropathy   . Gallstones   . Peripheral vascular disease   . IBS (irritable bowel syndrome)   . Peripheral  arterial disease   . Tobacco abuse   . Hypertension   . Hyperlipidemia    Past Surgical History  Procedure Laterality Date  . Back surgery     History reviewed. No pertinent family history. Social History:  reports that he has been smoking Cigarettes.  He has been smoking about 0.00 packs per day for the past 60 years. He has never used smokeless tobacco. He reports that he does not drink alcohol or use illicit drugs. Allergies:  Allergies  Allergen Reactions  . Ciprofloxacin Hives  . Penicillins Hives  . Sulfonamide Derivatives Hives   Medications Prior to Admission  Medication Sig Dispense Refill  . allopurinol (ZYLOPRIM) 100 MG tablet Take 100 mg by mouth daily.      Marland Kitchen ALPRAZolam (XANAX) 1 MG tablet Take 1 mg by mouth daily as needed for anxiety or sleep.       Marland Kitchen apixaban (ELIQUIS) 5 MG TABS tablet Take 1 tablet (5 mg total) by mouth 2 (two) times daily.  60 tablet  3  . baclofen (LIORESAL) 20 MG tablet Take 10-20 mg by mouth 2 (two) times daily. Take 10 mg morning, take 20 mg noon, 10 mg at 6 pm and 20 mg 11 pm.      . cilostazol (PLETAL) 50 MG tablet Take 50 mg by mouth 2 (two) times daily.       Marland Kitchen diltiazem (CARDIZEM CD) 120 MG 24 hr capsule Take 1 capsule (120 mg total) by mouth daily.  90 capsule  2  . folic acid (FOLVITE) 1 MG tablet Take 1 mg by mouth daily.      . Folic Acid-Vit D6-QIW L79 (FOLBEE) 2.5-25-1 MG TABS tablet Take 1 tablet by mouth daily.  90 tablet  2  . furosemide (LASIX) 40 MG tablet Take 40  mg by mouth daily.      Marland Kitchen HYDROcodone-acetaminophen (NORCO) 10-325 MG per tablet Take 1 tablet by mouth 3 (three) times daily. For pain      . KLOR-CON M20 20 MEQ tablet Take 20 mEq by mouth daily.       . metFORMIN (GLUCOPHAGE) 500 MG tablet Take 500 mg by mouth 2 (two) times daily.       . metoprolol (LOPRESSOR) 50 MG tablet Take 25 mg by mouth every evening.       . mupirocin cream (BACTROBAN) 2 % Apply 1 application topically as needed (for skin irritation).         . ondansetron (ZOFRAN) 4 MG tablet Take 1 tablet (4 mg total) by mouth every 6 (six) hours.  12 tablet  0  . pantoprazole (PROTONIX) 40 MG tablet Take 1 tablet (40 mg total) by mouth daily.  90 tablet  3  . ramipril (ALTACE) 2.5 MG capsule Take 1 capsule (2.5 mg total) by mouth daily.  90 capsule  2  . simvastatin (ZOCOR) 20 MG tablet Take 1 tablet (20 mg total) by mouth every evening.  30 tablet  6  . SPIRIVA HANDIHALER 18 MCG inhalation capsule Place 18 mcg into inhaler and inhale daily as needed (for shortness of breath).       Marland Kitchen tiZANidine (ZANAFLEX) 4 MG tablet Take 2-4 mg by mouth 4 (four) times daily. 6 am- half tab 11 am- 1 tab 6 pm- half tab 11 pm- 2 tabs      . traZODone (DESYREL) 50 MG tablet Take 100 mg by mouth at bedtime.       Marland Kitchen zolpidem (AMBIEN CR) 12.5 MG CR tablet Take 12.5 mg by mouth daily.      . cefUROXime (CEFTIN) 250 MG tablet Take 1 tablet (250 mg total) by mouth 2 (two) times daily with a meal.  14 tablet  0    Home: Home Living Family/patient expects to be discharged to:: Inpatient rehab Living Arrangements: Spouse/significant other Additional Comments: has a lift chair; uses rollator RW; up till the two weeks prior to this admission, pt and wife went out to dinner 3x/week  Functional History: Prior Function Level of Independence: Independent with assistive device(s) Functional Status:  Mobility: Bed Mobility Overal bed mobility: Needs Assistance Bed Mobility: Supine to Sit Supine to sit: +2 for physical assistance;Max assist General bed mobility comments: Cues to initiate and for technique; max assist to elevate trunk and square off hips at EOB Transfers Overall transfer level: Needs assistance Equipment used: 2 person hand held assist (and bil trunk/hip support at belt) Transfers: Stand Pivot Transfers Stand pivot transfers: Max assist;+2 physical assistance General transfer comment: Pt anxious during transfer to chair (placed on his R side); Required  bil knee blocking for stability; he yelled out during transfer, but later said it was from being scared, not from pain      ADL:    Cognition: Cognition Overall Cognitive Status: Impaired/Different from baseline Orientation Level: Oriented to person;Oriented to place;Oriented to situation Cognition Arousal/Alertness: Awake/alert Behavior During Therapy: WFL for tasks assessed/performed Overall Cognitive Status: Impaired/Different from baseline Area of Impairment: Orientation Orientation Level: Disoriented to;Place;Situation;Time General Comments: Gave his own street address when asked where we are  Blood pressure 168/75, pulse 80, temperature 97.8 F (36.6 C), temperature source Oral, resp. rate 18, height 5\' 11"  (1.803 m), weight 101.6 kg (223 lb 15.8 oz), SpO2 94.00%. Physical Exam  Constitutional: He appears well-developed.  HENT:  Head:  Normocephalic.  Eyes: EOM are normal.  Neck: Normal range of motion. Neck supple. No thyromegaly present.  Cardiovascular:  Cardiac rate controlled  Respiratory: Effort normal and breath sounds normal. No respiratory distress.  GI: Soft. Bowel sounds are normal. He exhibits no distension.  Neurological: He is alert.  Patient sitting up at bedside using the urinal. He made good eye contact with examiner. He was able to state his name age as well as date of birth. Follows simple commands. He did have some delay in higher cognitive functioning and limited medical historian.  Skin: Skin is warm and dry.   motor strength is 4/5 bilateral deltoid, bicep, tricep, grip 3 minus bilaterally hip flexors, 4 minus knee extensors, 4 minus ankle dorsiflexor plantar flexor Sensation reduced to light touch bilateral lower extremity  Results for orders placed during the hospital encounter of 06/10/14 (from the past 24 hour(s))  GLUCOSE, CAPILLARY     Status: Abnormal   Collection Time    06/19/14  7:36 AM      Result Value Ref Range   Glucose-Capillary  144 (*) 70 - 99 mg/dL   Comment 1 Capillary Sample    GLUCOSE, CAPILLARY     Status: Abnormal   Collection Time    06/19/14 11:28 AM      Result Value Ref Range   Glucose-Capillary 201 (*) 70 - 99 mg/dL   Comment 1 Capillary Sample    GLUCOSE, CAPILLARY     Status: Abnormal   Collection Time    06/19/14  4:48 PM      Result Value Ref Range   Glucose-Capillary 213 (*) 70 - 99 mg/dL   Comment 1 Capillary Sample    GLUCOSE, CAPILLARY     Status: Abnormal   Collection Time    06/19/14  7:38 PM      Result Value Ref Range   Glucose-Capillary 152 (*) 70 - 99 mg/dL   Comment 1 Capillary Sample    GLUCOSE, CAPILLARY     Status: Abnormal   Collection Time    06/19/14 11:32 PM      Result Value Ref Range   Glucose-Capillary 131 (*) 70 - 99 mg/dL   Comment 1 Capillary Sample    BASIC METABOLIC PANEL     Status: Abnormal   Collection Time    06/20/14  4:00 AM      Result Value Ref Range   Sodium 143  137 - 147 mEq/L   Potassium 3.0 (*) 3.7 - 5.3 mEq/L   Chloride 98  96 - 112 mEq/L   CO2 34 (*) 19 - 32 mEq/L   Glucose, Bld 152 (*) 70 - 99 mg/dL   BUN 28 (*) 6 - 23 mg/dL   Creatinine, Ser 0.67  0.50 - 1.35 mg/dL   Calcium 8.8  8.4 - 10.5 mg/dL   GFR calc non Af Amer >90  >90 mL/min   GFR calc Af Amer >90  >90 mL/min   Anion gap 11  5 - 15  MAGNESIUM     Status: None   Collection Time    06/20/14  4:00 AM      Result Value Ref Range   Magnesium 1.9  1.5 - 2.5 mg/dL  PHOSPHORUS     Status: None   Collection Time    06/20/14  4:00 AM      Result Value Ref Range   Phosphorus 2.8  2.3 - 4.6 mg/dL  CBC     Status: Abnormal  Collection Time    06/20/14  4:00 AM      Result Value Ref Range   WBC 19.4 (*) 4.0 - 10.5 K/uL   RBC 6.41 (*) 4.22 - 5.81 MIL/uL   Hemoglobin 19.1 (*) 13.0 - 17.0 g/dL   HCT 57.0 (*) 39.0 - 52.0 %   MCV 88.9  78.0 - 100.0 fL   MCH 29.8  26.0 - 34.0 pg   MCHC 33.5  30.0 - 36.0 g/dL   RDW 15.9 (*) 11.5 - 15.5 %   Platelets 222  150 - 400 K/uL  GLUCOSE,  CAPILLARY     Status: Abnormal   Collection Time    06/20/14  4:03 AM      Result Value Ref Range   Glucose-Capillary 153 (*) 70 - 99 mg/dL   Comment 1 Capillary Sample     Mr Brain Wo Contrast  06/18/2014   CLINICAL DATA:  Patient fell at home today and struck head. Brief unresponsiveness. History of diabetes, atrial fibrillation, and hypertension. Also history of seizures.  EXAM: MRI HEAD WITHOUT CONTRAST  TECHNIQUE: Multiplanar, multiecho pulse sequences of the brain and surrounding structures were obtained without intravenous contrast.  COMPARISON:  CT head 06/16/2014.  MR head 06/12/2014.  FINDINGS: Motion degraded exam.  Images are suboptimal.  No evidence for acute infarction, hemorrhage, mass lesion, hydrocephalus, or extra-axial fluid. Generalized atrophy. Mild subcortical and periventricular T2 and FLAIR hyperintensities, likely chronic microvascular ischemic change. Remote LEFT cerebellar infarct.  Flow voids are maintained throughout the carotid, basilar, and RIGHT vertebral arteries. LEFT vertebral poorly seen, likely terminating in PICA, similar to previous study. There are no areas of chronic hemorrhage.  No temporal lobe asymmetry on coronal T2 weighted imaging. Pituitary, pineal, and cerebellar tonsils unremarkable. No upper cervical lesions. Visualized calvarium, skull base, and upper cervical osseous structures unremarkable. Scalp and extracranial soft tissues, orbits, and sinuses show no acute process. Small BILATERAL mastoid effusions.  Compared with prior CT and MR, there is good general agreement.  IMPRESSION: Chronic changes as described. No acute intracranial findings. Similar appearance to priors. No visible posttraumatic sequelae.   Electronically Signed   By: Rolla Flatten M.D.   On: 06/18/2014 16:49    Assessment/Plan: Diagnosis: D. build T. after status epilepticus and Vent dependent respiratory failure 1. Does the need for close, 24 hr/day medical supervision in concert  with the patient's rehab needs make it unreasonable for this patient to be served in a less intensive setting? Potentially 2. Co-Morbidities requiring supervision/potential complications: Diabetes with peripheral neuropathy, atrial fibrillation with rapid ventricular response 3. Due to bladder management, bowel management, safety, skin/wound care, disease management, medication administration and patient education, does the patient require 24 hr/day rehab nursing? Potentially 4. Does the patient require coordinated care of a physician, rehab nurse, PT (1-2 hrs/day, 5 days/week), OT (1-2 hrs/day, 5 days/week) and SLP (0.51 hrs/day, 5 days/week) to address physical and functional deficits in the context of the above medical diagnosis(es)? Yes Addressing deficits in the following areas: balance, endurance, locomotion, strength, transferring, bowel/bladder control, bathing, dressing, feeding, grooming, toileting and swallowing 5. Can the patient actively participate in an intensive therapy program of at least 3 hrs of therapy per day at least 5 days per week? Potentially 6. The potential for patient to make measurable gains while on inpatient rehab is good 7. Anticipated functional outcomes upon discharge from inpatient rehab are supervision  with PT, supervision with OT, supervision with SLP. 8. Estimated rehab length of stay to  reach the above functional goals is: 10-14 days 9. Does the patient have adequate social supports to accommodate these discharge functional goals? Yes 10. Anticipated D/C setting: Home 11. Anticipated post D/C treatments: [facility] therapy 12. Overall Rehab/Functional Prognosis: good  RECOMMENDATIONS: This patient's condition is appropriate for continued rehabilitative care in the following setting: CIR Patient has agreed to participate in recommended program. Potentially Note that insurance prior authorization may be required for reimbursement for recommended care.  Comment: Needs  to be off Cardizem drip tolerating po antiarrythmic prior to CIR, MBS pending    06/20/2014

## 2014-06-20 NOTE — Progress Notes (Signed)
PULMONARY / CRITICAL CARE MEDICINE   Name: Chad Morse MRN: [id number] DOB: 09-Oct-1940    ADMISSION DATE:  06/10/2014 CONSULTATION DATE:  06/10/2014  REFERRING MD :  [place]  CHIEF COMPLAINT:  Seizure  INITIAL PRESENTATION:  73 y/o male smoker with COPD and Afib who was admitted on 9/25 from the [facility] ED after he had fell at home when standing from a sitting position and struck his head.  He was briefly unresponsive afterwards so EMS was called.  He was initially conversant in the ED but then had seizure in the ED and was intubated for airway protection.     MAJOR EVENTS/TEST RESULTS: 9/25 CT head > No Acute Intracranial pathology 9/25 CT angio chest/ab/pelv > No evidence of thoracic or abdominal aortic aneurysm or dissection, no PE, diffuse emphysema and fibrosis of the lungs, mild gallbladder wall distention with sludge, diverticulosis without inflammatory changes 9/26: restless but purposeful on WUA off diprivan. Not on pressors. 9/26 TTE: LVEF 65-70%. RV moderately dilated. 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 9/28 repeat EEG: mod diffuse slowing 9/29 reintubated for stridor, steroids started 10/01 repeat EEG: consistent with a mild nonspecific generalized cerebral dysfunction (encephalopathy). There was no seizure or seizure predisposition recorded on this study  10/01 CT head: No evidence of acute intracranial abnormality. Old, small infarcts are again noted in the right basal ganglia and left cerebellum.  10/02 Extubated. Tolerating but requiring NRB. Exhibits episodes of central apnea. Fentanyl dosing decreased. Lasix X 1 given 10/3 rpt MRI neg   DEVICES:: ETT 9/25 >> 9/29, 9/29 >> 10/02 L IJ CVL 9/29 >>   MICRO: Blood 9/25 >> NEG  Resp  9/29 >> H flu  ANTIMICROBIALS:  Aztreonam 9/29 >> 10/01 Vanc 9/29 >>  10/01   SUBJECTIVE/OVERNIGHT/INTERVAL HX Awake,no distress  VITAL SIGNS: Temp:  [97.6 F (36.4 C)-99.1 F (37.3 C)] 98.5 F (36.9 C) (10/05 0745) Pulse Rate:  [80] 80 (10/04 1100) Resp:  [18-20] 18 (10/04 1600) BP: (120-182)/(75-102) 120/97 mmHg (10/05 0745) SpO2:  [87 %-94 %] 94 % (10/05 0755) HEMODYNAMICS:   VENTILATOR SETTINGS:   INTAKE / OUTPUT:  Intake/Output Summary (Last 24 hours) at 06/20/14 1008 Last data filed at 06/20/14 0700  Gross per 24 hour  Intake    720 ml  Output   1650 ml  Net   -930 ml    PHYSICAL EXAMINATION: General:  No distress Neuro:  MAEs, diffusely weak HEENT:  NCAT, PERRL, EOMI Cardiovascular: IRIR, no M Lungs: Clear anteriorly, cough strong Abdomen:  Soft, NT, +BS Ext: warm, no edema, chronic venous stasis changes  LABS: I have reviewed all of today's lab results. 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 H flu pna, Suspect mild pulm edema P:   Supp O2 to maintain SpO2 92-97% Cont scheduled and PRN BDs IS  Upright Neg balance noted, tolerated   CARDIOVASCULAR A: Chronic AF, rate controlled Hypertension, episodically severe Hyperlipidemia Peripheral vascular disease Probable chronic PAH P:  Continue ASA Cont Eliquis Holding home PO meds until swallow OK Diltiazem gtt 10/02 -change to PO once clears swallow - today meds with apple sauce Adding Cardizem 60 q6h  PRN metoprolol 10/02 to maintain HR < 115/min PRN hydralazine to maintain SBP < 170 mmHg tele  RENAL A:   Hypomagnesemia, resolved Hypocalcemia, resolved -treated aggressively since ? causing seizures Neg balance with improved pcxr hypokalemia P:   Monitor BMET intermittently Indicated K IB supp Hope to use oral supp in  future Continued lasix same dosing  GASTROINTESTINAL A:   Diverticulosis by CT abd/pelvis Gallbladder sludge by CT abd/plevis Recent nausea/vomiting/abdominal pain, resolved Constipation -no BM since admit P:   SUP: N/I post extubation Swallow eval -then advance Meds, apple sauce , puree likley Dulcolax prn, enema prn  HEMATOLOGIC A:   Polycythemia, resolved Mild thrombocytopenia, resolved P:  DVT px: arixaban (for AF), dc lovenox Monitor CBC intermittently Transfuse per usual ICU guidelines  INFECTIOUS A:  Aspiration suspected - H flu pnaPNA multiple ABx allergies P:   Micro and abx as above Resume azactam, and with H flu noted, give 7 days total, recognizing 48 hr no ABX , but had clinical progress  ENDOCRINE A:  DM2 P:   Change SSI to ACHS as diet ordered Holding metformin  NEUROLOGIC A:  Acute syncope Concern for seizure Gaze preference -resolved Chronic pain syndrome P:   Eval and mgmt per Neuro Holding baclofen PT consult   Family updated: Wife updated @ bedside- updated  10/5   TODAY'S SUMMARY:  Much improved,add oral card, dc drip as able, lasix, abx stop date, to [place]. Titus Mould, MD, [place] Pgr: [name]

## 2014-06-21 ENCOUNTER — Inpatient Hospital Stay (HOSPITAL_COMMUNITY)
Admission: RE | Admit: 2014-06-21 | Discharge: 2014-07-01 | DRG: 947 | Disposition: A | Discharge status: Other Acute Inpatient Hospital | Payer: Medicare Other | Source: Intra-hospital | Attending: Physical Medicine & Rehabilitation | Admitting: Physical Medicine & Rehabilitation

## 2014-06-21 ENCOUNTER — Encounter (HOSPITAL_COMMUNITY): Payer: Self-pay

## 2014-06-21 DIAGNOSIS — I482 Chronic atrial fibrillation, unspecified: Secondary | ICD-10-CM

## 2014-06-21 DIAGNOSIS — K580 Irritable bowel syndrome with diarrhea: Secondary | ICD-10-CM | POA: Diagnosis present

## 2014-06-21 DIAGNOSIS — K922 Gastrointestinal hemorrhage, unspecified: Secondary | ICD-10-CM

## 2014-06-21 DIAGNOSIS — F419 Anxiety disorder, unspecified: Secondary | ICD-10-CM | POA: Diagnosis present

## 2014-06-21 DIAGNOSIS — I959 Hypotension, unspecified: Secondary | ICD-10-CM | POA: Diagnosis not present

## 2014-06-21 DIAGNOSIS — I4821 Permanent atrial fibrillation: Secondary | ICD-10-CM

## 2014-06-21 DIAGNOSIS — R269 Unspecified abnormalities of gait and mobility: Secondary | ICD-10-CM | POA: Diagnosis present

## 2014-06-21 DIAGNOSIS — F17210 Nicotine dependence, cigarettes, uncomplicated: Secondary | ICD-10-CM | POA: Diagnosis present

## 2014-06-21 DIAGNOSIS — K5790 Diverticulosis of intestine, part unspecified, without perforation or abscess without bleeding: Secondary | ICD-10-CM | POA: Diagnosis present

## 2014-06-21 DIAGNOSIS — I4891 Unspecified atrial fibrillation: Secondary | ICD-10-CM | POA: Diagnosis present

## 2014-06-21 DIAGNOSIS — J439 Emphysema, unspecified: Secondary | ICD-10-CM | POA: Diagnosis present

## 2014-06-21 DIAGNOSIS — W19XXXD Unspecified fall, subsequent encounter: Secondary | ICD-10-CM | POA: Diagnosis present

## 2014-06-21 DIAGNOSIS — R0602 Shortness of breath: Secondary | ICD-10-CM

## 2014-06-21 DIAGNOSIS — E119 Type 2 diabetes mellitus without complications: Secondary | ICD-10-CM | POA: Diagnosis present

## 2014-06-21 DIAGNOSIS — F82 Specific developmental disorder of motor function: Secondary | ICD-10-CM | POA: Diagnosis present

## 2014-06-21 DIAGNOSIS — R4189 Other symptoms and signs involving cognitive functions and awareness: Secondary | ICD-10-CM | POA: Diagnosis present

## 2014-06-21 DIAGNOSIS — R1313 Dysphagia, pharyngeal phase: Secondary | ICD-10-CM | POA: Diagnosis present

## 2014-06-21 DIAGNOSIS — K284 Chronic or unspecified gastrojejunal ulcer with hemorrhage: Secondary | ICD-10-CM

## 2014-06-21 DIAGNOSIS — R0902 Hypoxemia: Secondary | ICD-10-CM

## 2014-06-21 DIAGNOSIS — E876 Hypokalemia: Secondary | ICD-10-CM | POA: Diagnosis not present

## 2014-06-21 DIAGNOSIS — J42 Unspecified chronic bronchitis: Secondary | ICD-10-CM

## 2014-06-21 DIAGNOSIS — D62 Acute posthemorrhagic anemia: Secondary | ICD-10-CM | POA: Diagnosis not present

## 2014-06-21 DIAGNOSIS — R002 Palpitations: Secondary | ICD-10-CM | POA: Diagnosis present

## 2014-06-21 DIAGNOSIS — D72829 Elevated white blood cell count, unspecified: Secondary | ICD-10-CM | POA: Diagnosis not present

## 2014-06-21 DIAGNOSIS — I10 Essential (primary) hypertension: Secondary | ICD-10-CM | POA: Diagnosis present

## 2014-06-21 DIAGNOSIS — Z86010 Personal history of colonic polyps: Secondary | ICD-10-CM

## 2014-06-21 DIAGNOSIS — E860 Dehydration: Secondary | ICD-10-CM

## 2014-06-21 DIAGNOSIS — Z7901 Long term (current) use of anticoagulants: Secondary | ICD-10-CM

## 2014-06-21 DIAGNOSIS — G40901 Epilepsy, unspecified, not intractable, with status epilepticus: Secondary | ICD-10-CM

## 2014-06-21 DIAGNOSIS — R578 Other shock: Secondary | ICD-10-CM | POA: Diagnosis present

## 2014-06-21 DIAGNOSIS — J9601 Acute respiratory failure with hypoxia: Secondary | ICD-10-CM

## 2014-06-21 DIAGNOSIS — R1314 Dysphagia, pharyngoesophageal phase: Secondary | ICD-10-CM

## 2014-06-21 DIAGNOSIS — K219 Gastro-esophageal reflux disease without esophagitis: Secondary | ICD-10-CM | POA: Diagnosis present

## 2014-06-21 DIAGNOSIS — I739 Peripheral vascular disease, unspecified: Secondary | ICD-10-CM | POA: Diagnosis present

## 2014-06-21 DIAGNOSIS — R5381 Other malaise: Secondary | ICD-10-CM | POA: Diagnosis present

## 2014-06-21 DIAGNOSIS — E43 Unspecified severe protein-calorie malnutrition: Secondary | ICD-10-CM

## 2014-06-21 DIAGNOSIS — E785 Hyperlipidemia, unspecified: Secondary | ICD-10-CM | POA: Diagnosis present

## 2014-06-21 DIAGNOSIS — G9340 Encephalopathy, unspecified: Secondary | ICD-10-CM | POA: Diagnosis present

## 2014-06-21 DIAGNOSIS — J449 Chronic obstructive pulmonary disease, unspecified: Secondary | ICD-10-CM | POA: Diagnosis present

## 2014-06-21 DIAGNOSIS — J438 Other emphysema: Secondary | ICD-10-CM

## 2014-06-21 DIAGNOSIS — R198 Other specified symptoms and signs involving the digestive system and abdomen: Secondary | ICD-10-CM

## 2014-06-21 DIAGNOSIS — G629 Polyneuropathy, unspecified: Secondary | ICD-10-CM

## 2014-06-21 LAB — GLUCOSE, CAPILLARY
GLUCOSE-CAPILLARY: 181 mg/dL — AB (ref 70–99)
Glucose-Capillary: 143 mg/dL — ABNORMAL HIGH (ref 70–99)
Glucose-Capillary: 214 mg/dL — ABNORMAL HIGH (ref 70–99)
Glucose-Capillary: 170 mg/dL — ABNORMAL HIGH (ref 70–99)

## 2014-06-21 LAB — BASIC METABOLIC PANEL
ANION GAP: 11 (ref 5–15)
BUN: 30 mg/dL — ABNORMAL HIGH (ref 6–23)
CHLORIDE: 102 meq/L (ref 96–112)
CO2: 36 meq/L — AB (ref 19–32)
Calcium: 8.6 mg/dL (ref 8.4–10.5)
Creatinine, Ser: 0.79 mg/dL (ref 0.50–1.35)
GFR calc Af Amer: >90 mL/min (ref >90)
GFR calc non Af Amer: 86 mL/min — ABNORMAL LOW (ref >90)
Glucose, Bld: 148 mg/dL — ABNORMAL HIGH (ref 70–99)
POTASSIUM: 3.2 meq/L — AB (ref 3.7–5.3)
Sodium: 149 mEq/L — ABNORMAL HIGH (ref 137–147)

## 2014-06-21 LAB — MAGNESIUM: MAGNESIUM: 1.9 mg/dL (ref 1.5–2.5)

## 2014-06-21 LAB — PHOSPHORUS: Phosphorus: 2.8 mg/dL (ref 2.3–4.6)

## 2014-06-21 MED ORDER — DEXTROSE 5 % IV SOLN
INTRAVENOUS | Status: DC
  Administered 2014-06-21: 1000 mL via INTRAVENOUS

## 2014-06-21 MED ORDER — ALBUTEROL SULFATE (2.5 MG/3ML) 0.083% IN NEBU: 2.5000 mg | INHALATION_SOLUTION | RESPIRATORY_TRACT | Status: DC | PRN

## 2014-06-21 MED ORDER — FUROSEMIDE 40 MG PO TABS
40.0000 mg | ORAL_TABLET | Freq: Two times a day (BID) | ORAL | Status: DC
  Administered 2014-06-21 – 2014-06-22 (×3): 40 mg via ORAL
  Filled 2014-06-21 (×7): qty 1

## 2014-06-21 MED ORDER — SORBITOL 70 % SOLN: 30.0000 mL | Freq: Every day | Status: DC | PRN

## 2014-06-21 MED ORDER — BISACODYL 10 MG RE SUPP: 10.0000 mg | Freq: Every day | RECTAL | Status: DC | PRN

## 2014-06-21 MED ORDER — RESOURCE THICKENUP CLEAR PO POWD
ORAL | Status: DC | PRN
  Filled 2014-06-21 (×3): qty 125

## 2014-06-21 MED ORDER — ONDANSETRON HCL 4 MG PO TABS
4.0000 mg | ORAL_TABLET | Freq: Four times a day (QID) | ORAL | Status: DC | PRN
  Administered 2014-06-28 – 2014-06-29 (×3): 4 mg via ORAL
  Filled 2014-06-21 (×2): qty 1

## 2014-06-21 MED ORDER — ONDANSETRON HCL 4 MG/2ML IJ SOLN: 4.0000 mg | Freq: Four times a day (QID) | INTRAMUSCULAR | Status: DC | PRN

## 2014-06-21 MED ORDER — MAGNESIUM SULFATE 40 MG/ML IJ SOLN
2.0000 g | Freq: Once | INTRAMUSCULAR | Status: AC
  Administered 2014-06-21: 2 g via INTRAVENOUS
  Filled 2014-06-21: qty 50

## 2014-06-21 MED ORDER — CHLORHEXIDINE GLUCONATE 0.12 % MT SOLN
15.0000 mL | Freq: Two times a day (BID) | OROMUCOSAL | Status: DC
  Administered 2014-06-21 – 2014-07-01 (×17): 15 mL via OROMUCOSAL
  Filled 2014-06-21 (×23): qty 15

## 2014-06-21 MED ORDER — INSULIN ASPART 100 UNIT/ML ~~LOC~~ SOLN
0.0000 [IU] | Freq: Three times a day (TID) | SUBCUTANEOUS | Status: DC
  Administered 2014-06-21: 3 [IU] via SUBCUTANEOUS
  Administered 2014-06-22: 2 [IU] via SUBCUTANEOUS
  Administered 2014-06-22: 3 [IU] via SUBCUTANEOUS
  Administered 2014-06-22: 1 [IU] via SUBCUTANEOUS
  Administered 2014-06-23: 3 [IU] via SUBCUTANEOUS
  Administered 2014-06-23 – 2014-06-24 (×2): 1 [IU] via SUBCUTANEOUS
  Administered 2014-06-24: 2 [IU] via SUBCUTANEOUS
  Administered 2014-06-25 – 2014-06-26 (×2): 1 [IU] via SUBCUTANEOUS
  Administered 2014-06-27: 2 [IU] via SUBCUTANEOUS
  Administered 2014-06-27 – 2014-06-30 (×6): 1 [IU] via SUBCUTANEOUS
  Administered 2014-06-30: 2 [IU] via SUBCUTANEOUS
  Administered 2014-07-01 (×2): 1 [IU] via SUBCUTANEOUS

## 2014-06-21 MED ORDER — ATORVASTATIN CALCIUM 10 MG PO TABS
10.0000 mg | ORAL_TABLET | Freq: Every day | ORAL | Status: DC
  Administered 2014-06-21 – 2014-06-30 (×10): 10 mg via ORAL
  Filled 2014-06-21 (×11): qty 1

## 2014-06-21 MED ORDER — SODIUM CHLORIDE 0.45 % IV SOLN
INTRAVENOUS | Status: DC
  Administered 2014-06-21: 75 mL/h via INTRAVENOUS
  Administered 2014-06-22: 19:00:00 via INTRAVENOUS
  Administered 2014-06-24: 1 mL via INTRAVENOUS

## 2014-06-21 MED ORDER — POTASSIUM CHLORIDE 10 MEQ/100ML IV SOLN
10.0000 meq | INTRAVENOUS | Status: AC
  Administered 2014-06-21 (×2): 10 meq via INTRAVENOUS
  Filled 2014-06-21 (×2): qty 100

## 2014-06-21 MED ORDER — APIXABAN 5 MG PO TABS
5.0000 mg | ORAL_TABLET | Freq: Two times a day (BID) | ORAL | Status: DC
  Administered 2014-06-21 – 2014-07-01 (×20): 5 mg via ORAL
  Filled 2014-06-21 (×22): qty 1

## 2014-06-21 MED ORDER — SODIUM CHLORIDE 0.9 % IJ SOLN
10.0000 mL | INTRAMUSCULAR | Status: DC | PRN
  Administered 2014-06-21: 10 mL

## 2014-06-21 MED ORDER — CETYLPYRIDINIUM CHLORIDE 0.05 % MT LIQD
7.0000 mL | Freq: Four times a day (QID) | OROMUCOSAL | Status: DC
  Administered 2014-06-21 – 2014-07-01 (×29): 7 mL via OROMUCOSAL

## 2014-06-21 MED ORDER — METOPROLOL TARTRATE 12.5 MG HALF TABLET
12.5000 mg | ORAL_TABLET | Freq: Two times a day (BID) | ORAL | Status: DC
  Administered 2014-06-21 – 2014-07-01 (×20): 12.5 mg via ORAL
  Filled 2014-06-21 (×22): qty 1

## 2014-06-21 MED ORDER — POLYETHYLENE GLYCOL 3350 17 G PO PACK
17.0000 g | PACK | Freq: Every day | ORAL | Status: DC | PRN
  Filled 2014-06-21: qty 1

## 2014-06-21 MED ORDER — ACETAMINOPHEN 325 MG PO TABS
325.0000 mg | ORAL_TABLET | ORAL | Status: DC | PRN
  Administered 2014-06-24 – 2014-06-25 (×2): 650 mg via ORAL
  Filled 2014-06-21 (×3): qty 2

## 2014-06-21 MED ORDER — IPRATROPIUM-ALBUTEROL 0.5-2.5 (3) MG/3ML IN SOLN
3.0000 mL | Freq: Four times a day (QID) | RESPIRATORY_TRACT | Status: DC
  Administered 2014-06-21 – 2014-06-23 (×9): 3 mL via RESPIRATORY_TRACT
  Filled 2014-06-21 (×9): qty 3

## 2014-06-21 MED ORDER — DILTIAZEM HCL 60 MG PO TABS
60.0000 mg | ORAL_TABLET | Freq: Four times a day (QID) | ORAL | Status: DC
  Administered 2014-06-21 – 2014-07-01 (×39): 60 mg via ORAL
  Filled 2014-06-21 (×43): qty 1

## 2014-06-21 MED ORDER — SODIUM CHLORIDE 0.9 % IJ SOLN: 10.0000 mL | Freq: Two times a day (BID) | INTRAMUSCULAR | Status: DC

## 2014-06-21 NOTE — Discharge Summary (Signed)
Physician Discharge Summary  Patient ID: Chad Morse MRN: [id number] DOB/AGE: [birth year] 73 y.o.  Admit date: 06/10/2014 Discharge date: 06/21/2014  Problem List Active Problems:   Status epilepticus   Protein-calorie malnutrition, severe   Debility  HPI: 73 y/o male with COPD and Afib who was admitted on 9/25 from the [facility] ED after he had fell at home when standing from a sitting position and struck his head. He was briefly unresponsive afterwards so EMS was called. He was initially conversant in the ED but then had seizure in the ED and was intubated for airway protection. Further history could not be obtained due to intubation. In speaking with the patient's wife, she states that he has been unwell since Labor Day, when he developed nausea and vomiting and was diagnosed with a UTI, for which he completed some antibiotics. However, he has not been "quite right" with his GI tract since, complaining of decreased appetite, nausea, and abdominal pain. He has not had diarrhea. He has had very poor PO intake and has complained of his vision being blurry. No chest pain, no cough, some dyspnea. He continues to be a smoker, though has cut down in the last 10 days or so. He has chronic leg swelling and pain, which limits his home activities, so he is not very active but can walk. He does not drive and does need some help with dressing himself.   Hospital Course: MAJOR EVENTS/TEST RESULTS:  9/25 CT head > No Acute Intracranial pathology  9/25 CT angio chest/ab/pelv > No evidence of thoracic or abdominal aortic aneurysm or dissection, no PE, diffuse emphysema and fibrosis of the lungs, mild gallbladder wall distention with sludge, diverticulosis without inflammatory changes  9/26: restless but purposeful on WUA off diprivan. Not on pressors.  9/26 TTE: LVEF 65-70%. RV moderately dilated. 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  9/28 repeat EEG: mod diffuse slowing  9/29 reintubated for stridor, steroids started  10/01 repeat EEG: consistent with a mild nonspecific generalized cerebral dysfunction (encephalopathy). There was no seizure or seizure predisposition recorded on this study  10/01 CT head: No evidence of acute intracranial abnormality. Old, small infarcts are again noted in the right basal ganglia and left cerebellum.  10/02 Extubated. Tolerating but requiring NRB. Exhibits episodes of central apnea. Fentanyl dosing decreased. 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 causing seizures  hypokalemia  P:  Monitor BMET intermittently  Indicated  PO KCL  Continued lasix po  GASTROINTESTINAL  A:  Diverticulosis by CT abd/pelvis  Gallbladder sludge by CT abd/plevis  Recent  nausea/vomiting/abdominal pain, resolved  Constipation -no BM since admit  P:  SUP: N/I post extubation  Swallow eval -Dys 1  Meds, apple sauce , puree likley  Dulcolax prn, enema prn  HEMATOLOGIC  A:  Polycythemia, resolved  Mild thrombocytopenia, resolved  P:  DVT px: arixaban (for AF), dc lovenox  Monitor CBC intermittently  Transfuse per usual guidelines  INFECTIOUS  A: Aspiration suspected - H flu pnaPNA  multiple ABx allergies  P:  Micro and abx as above  Resume azactam, and with H flu noted, give 7 days total, recognizing 48 hr no ABX , but had clinical progress  ENDOCRINE  A: DM2  P:  Change SSI to ACHS as diet ordered  Holding metformin  NEUROLOGIC  A: Acute syncope  Concern for seizure  Gaze preference -resolved  Chronic pain syndrome  P:  Eval and mgmt per Neuro  Holding baclofen  PT consult  Family updated:  Wife updated @ bedside- updated 10/6  TODAY'S SUMMARY: Reaching [facility], he will be discharged to rehab today.         Labs at discharge Lab Results  Component Value Date   CREATININE 0.79 06/21/2014   BUN 30* 06/21/2014   NA 149* 06/21/2014   K 3.2* 06/21/2014   CL 102 06/21/2014   CO2 36* 06/21/2014   Lab Results  Component Value Date   WBC 19.4* 06/20/2014   HGB 19.1* 06/20/2014   HCT 57.0* 06/20/2014   MCV 88.9 06/20/2014   PLT 222 06/20/2014   Lab Results  Component Value Date   ALT 20 06/10/2014   AST 34 06/10/2014   ALKPHOS 109 06/10/2014   BILITOT 1.1 06/10/2014   Lab Results  Component Value Date   INR 1.46 06/10/2014   INR 0.95 01/24/2012   INR 0.95 05/12/2010    Current radiology studies Dg Swallowing Func-speech Pathology  06/20/2014   Orbie Pyo Mildred, CCC-SLP     06/20/2014 10:03 AM Objective Swallowing Evaluation: Modified Barium Swallowing Study   Patient Details  Name: Chad Morse MRN: [id number] Date of Birth: 1940/12/25  Today's Date: 06/20/2014 Time: 0905-0925 SLP Time Calculation (min): 20 min  Past Medical History:   Past Medical History  Diagnosis Date  . COPD (chronic obstructive pulmonary disease)   . Diabetes mellitus   . Atrial fibrillation   . Peripheral neuropathy   . Gallstones   . Peripheral vascular disease   . IBS (irritable bowel syndrome)   . Peripheral arterial disease   . Tobacco abuse   . Hypertension   . Hyperlipidemia    Past Surgical History:  Past Surgical History  Procedure Laterality Date  . Back surgery     HPI:  73 y/o male with PMH: COPD, Afib, DM, PVD, IBS, HTN admitted  after fall, struck his head with brief unresponsiveness.  Had  seizure in ED and was intubated.  Per MD note pt has had very  poor PO intake and has complained of his vision being blurry.  CT  negative for new abnormality, remote right basal ganglia and left  cerebelluym.  Extubated 9/29 and reintubaed 9/29-10/02. MRI  revealed chronic changes as described. No acute intracranial  findingsordered. CXR cardiomegaly with bilateral pulmonary  infiltrates and small pleural effusions most consistent with  congestive heart failure.  Pt. continues to exhibit pharyngeal  dysphagia, therefore, MBS recommended.      Assessment / Plan / Recommendation Clinical Impression  Dysphagia Diagnosis: Mild oral phase dysphagia;Moderate  pharyngeal phase dysphagia;Severe pharyngeal phase dysphagia Clinical impression: Pt. exhibited mild oral dysphagia with  decreased mastication and delayed transit with solid texture.   Moderate-severe pharyngeal dysphagia affected by an anatomical  source due to what appears to be significant boney osteophytes.   Epiglottis is unable to fully cover laryngeal vestibule due to  osteophytes encroaching into pharyngeal space resulting in  penetration onto vocal cords with reflexive cough.  Current  illness with intubation x 2 prevents him from naturally  compensating as he had prior to admission.  Prognosis appears  good as he gains strength and endurance.  Vallecular and pyfirom  sinus residue present with penetration once  from residuals.   Therapeutic intervention attempted with chin tuck without success  with textures thinner than puree.  Dys 1, pudding thick liquids  recommended with double swallows and intentional coughs/throat  clears.     Treatment Recommendation  Therapy as outlined in treatment plan below    Diet Recommendation Dysphagia 1 (Puree);Pudding-thick liquid  (nothing thinner than puree)   Medication Administration: Crushed with puree Supervision: Patient able to self feed;Full supervision/cueing  for compensatory strategies Compensations: Slow rate;Small sips/bites;Clear throat  intermittently;Clear throat after each swallow;Multiple dry  swallows after each bite/sip Postural Changes and/or Swallow Maneuvers: Seated upright 90  degrees;Upright 30-60 min after meal    Other  Recommendations Oral Care Recommendations: Oral care BID Other Recommendations: Order thickener from pharmacy   Follow Up Recommendations   (TBD)    Frequency and Duration min 2x/week  2 weeks   Pertinent Vitals/Pain No pain          Reason for Referral Objectively evaluate swallowing function   Oral Phase Oral Preparation/Oral Phase Oral Phase: Impaired Oral - Solids Oral - Regular: Delayed oral transit;Reduced posterior propulsion   Pharyngeal Phase Pharyngeal Phase Pharyngeal Phase: Impaired Pharyngeal - Honey Pharyngeal - Honey Teaspoon: Penetration/Aspiration during  swallow;Delayed swallow initiation;Premature spillage to  valleculae;Reduced epiglottic inversion;Pharyngeal residue -  valleculae;Pharyngeal residue - pyriform sinuses;Reduced tongue  base retraction;Reduced laryngeal elevation Penetration/Aspiration details (honey teaspoon): Material enters  airway, CONTACTS cords and not ejected out (reflexive throat  clear) Pharyngeal - Honey Cup: Penetration/Aspiration during  swallow;Pharyngeal residue - valleculae;Pharyngeal residue -  pyriform sinuses;Reduced laryngeal elevation;Reduced epiglottic  inversion;Reduced airway/laryngeal  closure (with chin tuck) Penetration/Aspiration details (honey cup): Material enters  airway, remains ABOVE vocal cords and not ejected out Pharyngeal - Nectar Pharyngeal - Nectar Teaspoon: Penetration/Aspiration during  swallow;Reduced epiglottic inversion;Pharyngeal residue -  valleculae;Pharyngeal residue - pyriform sinuses;Reduced tongue  base retraction;Reduced laryngeal elevation (chin tuck  ineffective) Penetration/Aspiration details (nectar teaspoon): Material enters  airway, CONTACTS cords and not ejected out Pharyngeal - Nectar Cup: Not tested Pharyngeal - Thin Pharyngeal - Thin Teaspoon: Penetration/Aspiration during  swallow;Pharyngeal residue - valleculae;Pharyngeal residue -  pyriform sinuses;Reduced tongue base retraction;Reduced laryngeal  elevation;Reduced epiglottic inversion Penetration/Aspiration details (thin teaspoon): Material enters  airway, CONTACTS cords and not ejected out (reflexive cough) Pharyngeal - Solids Pharyngeal - Puree: Delayed swallow initiation;Premature spillage  to valleculae Pharyngeal - Regular: Pharyngeal residue - valleculae;Pharyngeal  residue - pyriform sinuses;Reduced tongue base retraction;Reduced  laryngeal  elevation  Cervical Esophageal Phase    GO    Cervical Esophageal Phase Cervical Esophageal Phase: [name]         [name] 06/20/2014, 10:01 AM  [name].Ed CCC-SLP Pager [phone]      Disposition:  01-Home or Self Care  Discharge Instructions   Discharge patient    Complete by:  As directed   To rehab            Medication List    ASK your doctor about these medications       allopurinol 100 MG tablet  Commonly known as:  ZYLOPRIM  Take 100 mg by mouth daily.     ALPRAZolam 1 MG tablet  Commonly known as:  XANAX  Take 1 mg by mouth daily as needed for anxiety or sleep.     apixaban 5 MG Tabs tablet  Commonly known as:  ELIQUIS  Take 1 tablet (5 mg total) by mouth 2 (two) times daily.     baclofen 20 MG tablet   Commonly known as:  LIORESAL  Take 10-20 mg by mouth 2 (two) times daily. Take 10 mg morning, take 20 mg noon, 10 mg at 6 pm and 20 mg 11 pm.     cefUROXime 250 MG tablet  Commonly known as:  CEFTIN  Take 1 tablet (250 mg total) by mouth 2 (two) times daily with a meal.     cilostazol 50 MG tablet  Commonly known as:  PLETAL  Take 50 mg by mouth 2 (two) times daily.     diltiazem 120 MG 24 hr capsule  Commonly known as:  CARDIZEM CD  Take 1 capsule (120 mg total) by mouth daily.     folic acid 1 MG tablet  Commonly known as:  FOLVITE  Take 1 mg by mouth daily.     Folic Acid-Vit W2-OVZ C58 2.5-25-1 MG Tabs tablet  Commonly known as:  FOLBEE  Take 1 tablet by mouth daily.     furosemide 40 MG tablet  Commonly known as:  LASIX  Take 40 mg by mouth daily.     HYDROcodone-acetaminophen 10-325 MG per tablet  Commonly known as:  NORCO  Take 1 tablet by mouth 3 (three) times daily. For pain     KLOR-CON M20 20 MEQ tablet  Generic drug:  potassium chloride SA  Take 20 mEq by mouth daily.     metFORMIN 500 MG tablet  Commonly known as:  GLUCOPHAGE  Take 500 mg by mouth 2 (two) times daily.     metoprolol 50 MG tablet  Commonly known as:  LOPRESSOR  Take 25 mg by mouth every evening.     mupirocin cream 2 %  Commonly known as:  BACTROBAN  Apply 1 application topically as needed (for skin irritation).     ondansetron 4 MG tablet  Commonly known as:  ZOFRAN  Take 1 tablet (4 mg total) by mouth every 6 (six) hours.     pantoprazole 40 MG tablet  Commonly known as:  PROTONIX  Take 1 tablet (40 mg total) by mouth daily.     ramipril 2.5 MG capsule  Commonly known as:  ALTACE  Take 1 capsule (2.5 mg total) by mouth daily.     simvastatin 20 MG tablet  Commonly known as:  ZOCOR  Take 1 tablet (20 mg total) by mouth every evening.     SPIRIVA HANDIHALER 18 MCG inhalation capsule  Generic drug:  tiotropium  Place  18 mcg into inhaler and inhale daily as needed (for  shortness of breath).     tiZANidine 4 MG tablet  Commonly known as:  ZANAFLEX  - Take 2-4 mg by mouth 4 (four) times daily. 6 am- half tab  - 11 am- 1 tab  - 6 pm- half tab  - 11 pm- 2 tabs     traZODone 50 MG tablet  Commonly known as:  DESYREL  Take 100 mg by mouth at bedtime.     zolpidem 12.5 MG CR tablet  Commonly known as:  AMBIEN CR  Take 12.5 mg by mouth daily.          Discharged Condition: fair  Time spent on discharge greater than 40 minutes.  Vital signs at Discharge. Temp:  [98 F (36.7 C)-98.2 F (36.8 C)] 98.1 F (36.7 C) (10/06 0500) Pulse Rate:  [76-106] 81 (10/06 0500) Resp:  [20-22] 22 (10/06 0500) BP: (158-172)/(81-90) 170/84 mmHg (10/06 0500) SpO2:  [93 %-95 %] 94 % (10/06 0828) Weight:  [193 lb 14.4 oz (87.952 kg)] 193 lb 14.4 oz (87.952 kg) (10/06 0521) Office follow up Special Information or instructions. Per CIR unit Signed: Richardson Landry Kenley Troop ACNP Maryanna Shape PCCM Pager [phone] till 3 pm If no answer page [phone] 06/21/2014, 11:36 AM

## 2014-06-21 NOTE — Progress Notes (Signed)
Inpatient Diabetes Program Recommendations  AACE/ADA: New Consensus Statement on Inpatient Glycemic Control (2013)  Target Ranges:  Prepandial:   less than 140 mg/dL      Peak postprandial:   less than 180 mg/dL (1-2 hours)      Critically ill patients:  140 - 180 mg/dL     Results for ALMON, WHITFORD (MRN [id number]) as of 06/21/2014 07:12  Ref. Range 06/20/2014 07:47 06/20/2014 11:48 06/20/2014 16:53 06/20/2014 20:03  Glucose-Capillary Latest Range: 70-99 mg/dL 137 (H) 182 (H) 204 (H) 192 (H)     Patient having some occasional glucose elevations.    MD- Please consider increasing Novolog SSI to Moderate scale tid ac + HS (currently ordered as Sensitive scale)    Will follow Wyn Quaker RN, MSN, CDE Diabetes Coordinator Inpatient Diabetes Program Team Pager: [phone] (8a-10p)

## 2014-06-21 NOTE — Progress Notes (Signed)
Rehab admissions - Evaluated for possible admission.  I met with patient and his wife at the bedside.  Both are in agreement to inpatient rehab admission.  Bed available on rehab and will admit to acute inpatient rehab today.  Call me for questions.  #[phone]

## 2014-06-21 NOTE — PMR Pre-admission (Signed)
PMR Admission Coordinator Pre-Admission Assessment  Patient: Chad Morse is an 73 y.o., male MRN: [id number] DOB: Aug 18, 1941 Height: 5\' 11"  (180.3 cm) Weight: 87.952 kg (193 lb 14.4 oz)              Insurance Information HMO:      PPO:       PCP:       IPA:       80/20:       OTHER:   PRIMARY: Medicare A/B      Policy#: [id number]      Subscriber: Gean Birchwood CM Name:        Phone#:       Fax#:   Pre-Cert#:        Employer: Retired Benefits:  Phone #:       Name: Checked in [place]. Date: 02/15/04     Deduct: $1260      Out of Pocket Max: none      Life Max: unlimited CIR: 100%      SNF: 100 days Outpatient: 80%     Co-Pay: 20% Home Health: 100%      Co-Pay: none DME: 80%     Co-Pay: 20% Providers: patient's choice  SECONDARY: UHC      Policy#: [id number]      Subscriber: Lavone Orn CM Name:        Phone#:       Fax#:   Pre-Cert#:        Employer: Retired Benefits:  Phone #: [phone]     Name:   Eff. Date:       Deduct:        Out of Pocket Max:        Life Max:   CIR:        SNF:   Outpatient:       Co-Pay:   Home Health:        Co-Pay:   DME:       Co-Pay:     Emergency Contact Information Contact Information   Name Relation Home Work Mobile   [place] Spouse [phone]  [phone]   [place] Daughter   [phone]     Current Medical History  Patient Admitting Diagnosis: Deconditioned after seizure and respiratory failure  History of Present Illness: A 73 y.o. right-handed male with history of COPD, atrial fibrillation on Eliquis and seizure disorder. Patient lives with his wife uses a rolling walker prior to admission. Admitted 06/11/2014 after a fall at home when standing from a sitting position and struck his head. There was reports of brief unresponsiveness. He was initially conversant in the EEG and had a seizure requiring intubation for airway protection. Cranial CT scan negative for acute abnormalities and followup MRI  negative for acute changes. CT angiogram chest negative. Patient initially loaded with Keppra later discontinued secondary to agitation felt this was possibly contributing to his restlessness. No further seizure activity noted. Suspect hypomagnesemia as well as hypocalcemia possibly related to his seizures. EEG with moderate diffuse swelling no seizure activity noted generalized cerebral dysfunction suspect encephalopathy. Patient's Eliquis to be resumed for history of atrial fibrillation. All cardiac markers remained negative. Patient was extubated 06/17/2014. Physical therapy evaluation completed 06/19/2014 with recommendations of physical medicine rehabilitation consult.  Past Medical History  Past Medical History  Diagnosis Date  . COPD (chronic obstructive pulmonary disease)   . Diabetes mellitus   . Atrial fibrillation   . Peripheral neuropathy   . Gallstones   . Peripheral vascular  disease   . IBS (irritable bowel syndrome)   . Peripheral arterial disease   . Tobacco abuse   . Hypertension   . Hyperlipidemia     Family History  family history is not on file.  Prior Rehab/Hospitalizations:  Had rehab 12 years ago for leg weakness in house.   Current Medications  Current facility-administered medications:0.9 %  sodium chloride infusion, 250 mL, Intravenous, PRN, Juanito Doom, MD, Last Rate: 20 mL/hr at 06/15/14 2000, 250 mL at 06/15/14 2000;  albuterol (PROVENTIL) (2.5 MG/3ML) 0.083% nebulizer solution 2.5 mg, 2.5 mg, Nebulization, Q3H PRN, Juanito Doom, MD, 2.5 mg at 06/14/14 0848 antiseptic oral rinse (CPC / CETYLPYRIDINIUM CHLORIDE 0.05%) solution 7 mL, 7 mL, Mouth Rinse, QID, Juanito Doom, MD, 7 mL at 06/21/14 1200;  apixaban (ELIQUIS) tablet 5 mg, 5 mg, Oral, BID, Juanito Doom, MD, 5 mg at 06/21/14 7209;  atorvastatin (LIPITOR) tablet 10 mg, 10 mg, Oral, q1800, Wilhelmina Mcardle, MD, 10 mg at 06/20/14 1908 aztreonam (AZACTAM) 1 g in dextrose 5 % 50 mL IVPB, 1 g,  Intravenous, Q8H, Juanito Doom, MD, 1 g at 06/21/14 4709;  bisacodyl (DULCOLAX) suppository 10 mg, 10 mg, Rectal, Daily PRN, Juanito Doom, MD;  chlorhexidine (PERIDEX) 0.12 % solution 15 mL, 15 mL, Mouth Rinse, BID, Juanito Doom, MD, 15 mL at 06/21/14 0818 dextrose 5 % solution, , Intravenous, Continuous, Brand Males, MD, Last Rate: 50 mL/hr at 06/21/14 1000, 1,000 mL at 06/21/14 1000;  diltiazem (CARDIZEM) tablet 60 mg, 60 mg, Oral, 4 times per day, Juanito Doom, MD, 60 mg at 06/21/14 1218;  furosemide (LASIX) tablet 40 mg, 40 mg, Oral, BID, Juanito Doom, MD, 40 mg at 06/21/14 0819 hydrALAZINE (APRESOLINE) injection 10-40 mg, 10-40 mg, Intravenous, Q4H PRN, Wilhelmina Mcardle, MD, 20 mg at 06/20/14 0204;  insulin aspart (novoLOG) injection 0-9 Units, 0-9 Units, Subcutaneous, TID WC, Raylene Miyamoto, MD, 2 Units at 06/21/14 1216;  ipratropium-albuterol (DUONEB) 0.5-2.5 (3) MG/3ML nebulizer solution 3 mL, 3 mL, Nebulization, Q6H, Juanito Doom, MD, 3 mL at 06/21/14 0826 metoprolol (LOPRESSOR) injection 2.5-5 mg, 2.5-5 mg, Intravenous, Q3H PRN, Wilhelmina Mcardle, MD, 5 mg at 06/19/14 0218;  metoprolol tartrate (LOPRESSOR) tablet 12.5 mg, 12.5 mg, Oral, BID, Wilhelmina Mcardle, MD, 12.5 mg at 06/21/14 6283;  polyethylene glycol (MIRALAX / GLYCOLAX) packet 17 g, 17 g, Oral, Daily PRN, Wilhelmina Mcardle, MD potassium chloride 10 mEq in 100 mL IVPB, 10 mEq, Intravenous, Q1 Hr x 2, Brand Males, MD, 10 mEq at 06/21/14 1218;  [place], , Oral, PRN, Juanito Doom, MD  Patients Current Diet: Dysphagia  Precautions / Restrictions Precautions Precautions: Fall Restrictions Weight Bearing Restrictions: No   Prior Activity Level Community (5-7x/wk): Went out 3-4 X a week, has not driven in the past 12 years.  Home Assistive Devices / Equipment Home Assistive Devices/Equipment: Environmental consultant (specify type)  Prior Functional Level Prior Function Level of  Independence: Needs assistance ADL's / Homemaking Assistance Needed: wife assisted with socks and starting pants over feet, pt used back brush for feet.  Current Functional Level Cognition  Overall Cognitive Status: Within Functional Limits for tasks assessed Orientation Level: Oriented to time;Oriented to person;Oriented to place General Comments: Pt followed instructions well and  could follow cues for upright posture during standing    Extremity Assessment (includes Sensation/Coordination)  Upper Extremity Assessment: Defer to OT evaluation;Generalized weakness  Lower Extremity Assessment: Generalized weakness   ADLs  Overall ADL's : Needs assistance/impaired Eating/Feeding: Supervision/ safety;Bed level Eating/Feeding Details (indicate cue type and reason): pt on pureed food with thickened liquid, wants water but understands it must be thickened. Grooming: Dance movement psychotherapist;Wash/dry hands;Set up;Sitting Upper Body Bathing: Moderate assistance;Sitting Lower Body Bathing: Total assistance;Sit to/from stand Upper Body Dressing : Moderate assistance;Sitting Lower Body Dressing: Total assistance;Sit to/from stand Functional mobility during ADLs: +2 for physical assistance;Moderate assistance    Mobility  Overal bed mobility: Needs Assistance Bed Mobility: Sidelying to Sit Sidelying to sit: Min assist Supine to sit: +2 for physical assistance;Max assist Sit to supine: +2 for physical assistance;Max assist General bed mobility comments: Cues to initiate and for technique; max assist to elevate trunk and square off hips at EOB    Transfers  Overall transfer level: Needs assistance Equipment used: 1 person hand held assist Transfers: Sit to/from [facility] Sit to Stand: Min assist Stand pivot transfers: Min assist General transfer comment: Had another person to stand by due to prior functional level but PT did alone    Ambulation / Gait / Stairs / Scientist, clinical (histocompatibility and immunogenetics)  Ambulation/Gait Ambulation/Gait assistance: Museum/gallery curator (Feet): 2 Feet Assistive device: 1 person hand held assist Gait Pattern/deviations: Step-to pattern;Wide base of support;Decreased step length - left;Decreased step length - right;Decreased dorsiflexion - right;Decreased dorsiflexion - left Gait velocity: slow Gait velocity interpretation: Below normal speed for age/gender General Gait Details: flexed posture after extended standing to get bowel movement clean before getting inchair.      Posture / Balance Dynamic Sitting Balance Sitting balance - Comments: needed at least one hand on bed for balance    Special needs/care consideration BiPAP/CPAP No CPM No Continuous Drip IV Has a peripheral IV Dialysis No    Life Vest No Oxygen Yes, on 4L [place] in hospital, but not on O2 at home Special Bed No Trach Size No Wound Vac (area) No      Skin Lower legs with dry skin and scabbed areas                              Bowel mgmt: Incontinent of stool 06/21/14 Bladder mgmt: Has a condom catheter Diabetic mgmt Yes, on oral medications at home, but on insulin in hospital.    Previous Home Environment Living Arrangements: Spouse/significant other Home Care Services: No Additional Comments: has a lift chair; uses rollator RW; up till the two weeks prior to this admission, pt and wife went out to dinner 3x/week  Discharge Living Setting Plans for Discharge Living Setting: Patient's home;House;Lives with (comment) (Lives with wife.) Type of Home at Discharge: House Discharge Home Layout: One level Discharge Home Access: Ramped entrance Does the patient have any problems obtaining your medications?: No  Social/Family/Support Systems Patient Roles: Spouse;Parent (Has a husband and a 47 yr old daughter.) Contact Information: Brixton Franko - wife Anticipated Caregiver: wife Anticipated Caregiver's Contact Information: Jana Half - wife (h) [phone] (c)  [phone] Ability/Limitations of Caregiver: Wife can assist.  Dtr had previous MI and works in a [employer] firm.  Son died 33 yrs ago from MI. Caregiver Availability: 24/7 Discharge Plan Discussed with Primary Caregiver: Yes Is Caregiver In Agreement with Plan?: Yes Does Caregiver/Family have Issues with Lodging/Transportation while Pt is in Rehab?: No  Goals/Additional Needs Patient/Family Goal for Rehab: PT/OT/ST supervision goals Expected length of stay: 10-14 days Cultural Considerations: Christian Dietary Needs: Dys !, pudding thick liquids Equipment Needs: TBD Additional Information: Has  son-in-law who works 911 call center on 2 days, off 2 days who can help when off.  Dtr can help when not working. Pt/Family Agrees to Admission and willing to participate: Yes Program Orientation Provided & Reviewed with Pt/Caregiver Including Roles  & Responsibilities: Yes  Decrease burden of Care through IP rehab admission: N/A  Possible need for SNF placement upon discharge: Not planned  Patient Condition: This patient's condition remains as documented in the consult dated 06/20/14, in which the Rehabilitation Physician determined and documented that the patient's condition is appropriate for intensive rehabilitative care in an inpatient rehabilitation facility. Will admit to inpatient rehab today.  Preadmission Screen Completed By:  Retta Diones, 06/21/2014 12:31 PM ______________________________________________________________________   Discussed status with Dr. Naaman Plummer on 06/21/14 at 1229 and received telephone approval for admission today.  Admission Coordinator:  Retta Diones, time1229/Date10/06/15

## 2014-06-21 NOTE — Progress Notes (Signed)
Speech Language Pathology Treatment: Dysphagia  Patient Details Name: Chad Morse MRN: [id number] DOB: [birth year] Today's Date: 06/21/2014 Time: [id number] SLP Time Calculation (min): 24 min  Assessment / Plan / Recommendation Clinical Impression  Observed patient drinking pudding thick liquids by spoon.  Patient required moderate verbal cues to swallow twice with each sip, and to take small sips.  Pt had one episode of coughing after the swallow when taking a large heaping spoonful.  Pt remains afebrile, with rhonchi noted in UL's and diminished BS in LL's.  CXR: of 10/2 (most recent) revieals patchy bibasilar airspace opacities and probable pulmonary edema.  Pt currently being d/c'd to CIR with f/u SLP recommended for dysphagia management and diet advancement when appropriate.   HPI HPI: 73 y/o male with PMH: COPD, Afib, DM, PVD, IBS, HTN admitted after fall, struck his head with brief unresponsiveness.  Had seizure in ED and was intubated.  Per MD note pt has had very poor PO intake and has complained of his vision being blurry.  CT negative for new abnormality, remote right basal ganglia and left cerebelluym.  Extubated 9/29 and reintubaed 9/29-10/02. MRI revealed chronic changes as described. No acute intracranial findingsordered. CXR cardiomegaly with bilateral pulmonary infiltrates and small pleural effusions most consistent with congestive heart failure.  Pt. continues to exhibit pharyngeal dysphagia, therefore, MBS recommended.    Pertinent Vitals Pain Assessment: No/denies pain  SLP Plan  Discharge SLP treatment due to (comment) (d/c to CIR)    Recommendations Diet recommendations: Dysphagia 1 (puree);Pudding-thick liquid Liquids provided via: Teaspoon Medication Administration: Crushed with puree Supervision: Patient able to self feed;Full supervision/cueing for compensatory strategies Compensations: Slow rate;Small sips/bites;Clear throat intermittently;Clear throat after each  swallow;Multiple dry swallows after each bite/sip Postural Changes and/or Swallow Maneuvers: Seated upright 90 degrees;Upright 30-60 min after meal              Oral Care Recommendations: Oral care BID Follow up Recommendations: Inpatient Rehab Plan: Discharge SLP treatment due to (comment) (d/c to CIR)    GO     Chad Morse T 06/21/2014, 4:27 PM

## 2014-06-21 NOTE — Progress Notes (Signed)
Clinical Social Work Department BRIEF PSYCHOSOCIAL ASSESSMENT 06/21/2014  Patient:  Chad Morse, Chad Morse     Account Number:  [id number]     Admit date:  06/10/2014  Clinical Social Worker:  Adair Laundry  Date/Time:  06/21/2014 11:00 AM  Referred by:  Physician  Date Referred:  06/21/2014 Referred for  SNF Placement   Other Referral:   Interview type:  Patient Other interview type:   Spoke with pt and pt wife at bedside    PSYCHOSOCIAL DATA Living Status:  WIFE Admitted from facility:   Level of care:   Primary support name:  Lavone Orn Primary support relationship to patient:  SPOUSE Degree of support available:   Pt has good support    CURRENT CONCERNS Current Concerns  Post-Acute Placement   Other Concerns:    SOCIAL WORK ASSESSMENT / PLAN CSW visited pt room and spoke with pt and pt wife about CIR vs SNF. Pt and pt wife did have some initial confusion but after CSW explained further pt and pt wife were in agreement that CIR would be a good plan. CSW informed them that pt would be screened and if appropriate rehab admissions coordinator will come speak with them further about CIR. CSW offered futher information on ST rehab and pt and pt wife were agreeable to this being back up option if CIR is not available. Pt did express some hesitation but after pt wife explained her concerns and that she believe pt would benefit from rehab, pt was more understanding and agreeable. Pt requesting to have referral sent to [facility].   Assessment/plan status:  Psychosocial Support/Ongoing Assessment of Needs Other assessment/ plan:   Information/referral to community resources:   SNF list to be provided with bed offers    PATIENT'S/FAMILY'S RESPONSE TO PLAN OF CARE: Pt has pt wife are wanting CIR and agreeable to sending SNF referral as back up.       [place], [place]

## 2014-06-21 NOTE — Progress Notes (Addendum)
Clinical Social Work Department CLINICAL SOCIAL WORK PLACEMENT NOTE 06/21/2014  Patient:  Chad Morse, Chad Morse  Account Number:  [id number] Admit date:  06/10/2014  Clinical Social Worker:  Adair Laundry  Date/time:  06/21/2014 11:00 AM  Clinical Social Work is seeking post-discharge placement for this patient at the following level of care:   [place]   (*CSW will update this form in Epic as items are completed)   06/21/2014  Patient/family provided with [facility] Department of Clinical Social Work's list of facilities offering this level of care within the geographic area requested by the patient (or if unable, by the patient's family).  06/21/2014  Patient/family informed of their freedom to choose among providers that offer the needed level of care, that participate in Medicare, Medicaid or managed care program needed by the patient, have an available bed and are willing to accept the patient.  06/21/2014  Patient/family informed of [facility]' ownership interest in [facility], as well as of the fact that they are under no obligation to receive care at this facility.  PASARR submitted to EDS on 06/21/2014 PASARR number received on 06/21/2014  FL2 transmitted to all facilities in geographic area requested by pt/family on  06/21/2014 FL2 transmitted to all facilities within larger geographic area on   Patient informed that his/her managed care company has contracts with or will negotiate with  certain facilities, including the following:     Patient/family informed of bed offers received:  ---- Patient chooses bed at [facility] recommends and patient chooses bed at    Patient to be transferred to [facility]  on   Patient to be transferred to facility by  Patient and family notified of transfer on  Name of family member notified:    The following physician request were entered in Epic: Physician Request  Please sign FL2.    Additional  CommentsBerton Mount, [place]

## 2014-06-21 NOTE — Progress Notes (Signed)
PULMONARY / CRITICAL CARE MEDICINE   Name: Chad Morse MRN: [id number] DOB: 21-May-1941    ADMISSION DATE:  06/10/2014 CONSULTATION DATE:  06/10/2014  REFERRING MD :  Rancour  CHIEF COMPLAINT:  Seizure  INITIAL PRESENTATION:  73 y/o male smoker with COPD and Afib who was admitted on 9/25 from the [facility] ED after he had fell at home when standing from a sitting position and struck his head.  He was briefly unresponsive afterwards so EMS was called.  He was initially conversant in the ED but then had seizure in the ED and was intubated for airway protection.     MAJOR EVENTS/TEST RESULTS: 9/25 CT head > No Acute Intracranial pathology 9/25 CT angio chest/ab/pelv > No evidence of thoracic or abdominal aortic aneurysm or dissection, no PE, diffuse emphysema and fibrosis of the lungs, mild gallbladder wall distention with sludge, diverticulosis without inflammatory changes 9/26: restless but purposeful on WUA off diprivan. Not on pressors. 9/26 TTE: LVEF 65-70%. RV moderately dilated. 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 9/28 repeat EEG: mod diffuse slowing 9/29 reintubated for stridor, steroids started 10/01 repeat EEG: consistent with a mild nonspecific generalized cerebral dysfunction (encephalopathy). There was no seizure or seizure predisposition recorded on this study  10/01 CT head: No evidence of acute intracranial abnormality. Old, small infarcts are again noted in the right basal ganglia and left cerebellum.  10/02 Extubated. Tolerating but requiring NRB. Exhibits episodes of central apnea. Fentanyl dosing decreased. Lasix X 1 given 10/3 rpt MRI neg 10/5 transferred to tele 10/5 CIR consult with recommendation for CIR  DEVICES:: ETT 9/25 >> 9/29, 9/29 >> 10/02 L IJ CVL 9/29 >>   MICRO: Blood 9/25 >> NEG  Resp  9/29  >> H flu  ANTIMICROBIALS:  Aztreonam 9/29 >> 10/01  10/3 >>10/7 planned Vanc 9/29 >> 10/01   SUBJECTIVE/OVERNIGHT/INTERVAL HX Awake,no distress, feels worse today  VITAL SIGNS: Temp:  [98 F (36.7 C)-98.2 F (36.8 C)] 98.1 F (36.7 C) (10/06 0500) Pulse Rate:  [76-106] 81 (10/06 0500) Resp:  [20-22] 22 (10/06 0500) BP: (158-172)/(81-90) 170/84 mmHg (10/06 0500) SpO2:  [93 %-95 %] 94 % (10/06 0828) Weight:  [193 lb 14.4 oz (87.952 kg)] 193 lb 14.4 oz (87.952 kg) (10/06 0521) HEMODYNAMICS:   VENTILATOR SETTINGS:   INTAKE / OUTPUT:  Intake/Output Summary (Last 24 hours) at 06/21/14 0912 Last data filed at 06/21/14 0500  Gross per 24 hour  Intake    700 ml  Output    300 ml  Net    400 ml    PHYSICAL EXAMINATION: General:  No distress, feels weaker. Look weak and puny. "Im weaker today". Neuro:  MAEs, diffusely weak HEENT:  NCAT, PERRL, EOMI Cardiovascular: IRIR, no M Afib with CVR 81 Lungs: Clear anteriorly, cough strong Abdomen:  Soft, NT, +BS Ext: warm, no edema, chronic venous stasis changes. Multiple areas of ecchymosis.   LABS: I have reviewed all of today's lab results. 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 causing seizures hypokalemia P:   Monitor BMET intermittently Indicated PO KCL Continued lasix po  GASTROINTESTINAL A:   Diverticulosis by CT abd/pelvis Gallbladder sludge by CT abd/plevis Recent nausea/vomiting/abdominal pain, resolved Constipation -no BM since admit P:   SUP: N/I post extubation Swallow eval -Dys 1 Meds, apple sauce , puree likley Dulcolax prn, enema prn  HEMATOLOGIC A:   Polycythemia, resolved Mild thrombocytopenia, resolved P:  DVT px: arixaban (for AF), dc lovenox Monitor CBC intermittently Transfuse per usual  guidelines  INFECTIOUS A:  Aspiration suspected - H flu pnaPNA multiple ABx allergies P:   Micro and abx as above Resume azactam, and with H flu noted, give 7 days total, recognizing 48 hr no ABX , but had clinical progress  ENDOCRINE A:  DM2 P:   Change SSI to ACHS as diet ordered Holding metformin  NEUROLOGIC A:  Acute syncope Concern for seizure Gaze preference -resolved Chronic pain syndrome P:   Eval and mgmt per Neuro Holding baclofen PT consult   Family updated: Wife updated @ bedside- updated  10/6   TODAY'S SUMMARY: Reaching [facility]HB, he could be discharged to rehab at any time.  Richardson Landry Lashante Fryberger ACNP Maryanna Shape PCCM Pager [phone] till 3 pm If no answer page [phone] 06/21/2014, 9:24 AM

## 2014-06-21 NOTE — Progress Notes (Signed)
Physical Therapy Treatment Patient Details Name: Chad Morse MRN: [id number] DOB: 12-10-1940 Today's Date: 06/21/2014    History of Present Illness 73 y/o male with PMH: COPD, Afib, DM, PVD, IBS, HTN admitted after fall, struck his head with brief unresponsiveness.  Had seizure in ED and was intubated.  Per MD note pt has had very poor PO intake and has complained of his vision being blurry.  CT negative for new abnormality, remote right basal ganglia and left cerebelluym.  Extubated 9/29 and reintubaed 9/29-10/02. MRI ordered. CXR cardiomegaly with bilateral pulmonary infiltrates and small pleural effusions most consistent with congestive heart failure.     PT Comments    Pt was seen for visit crossing over with SW asking about CIR.  Pt and wife are in agreement with trying to get in.  Has improved standing control and effort today with good expectations for recovery with rehab as he is currently progressing.  Follow Up Recommendations  CIR     Equipment Recommendations  None recommended by PT    Recommendations for Other Services Rehab consult     Precautions / Restrictions Precautions Precautions: Fall Restrictions Weight Bearing Restrictions: No    Mobility  Bed Mobility Overal bed mobility: Needs Assistance Bed Mobility: Sidelying to Sit   Sidelying to sit: Min assist          Transfers Overall transfer level: Needs assistance Equipment used: 1 person hand held assist Transfers: Sit to/from [facility] Sit to Stand: Min assist Stand pivot transfers: Min assist       General transfer comment: Had another person to stand by due to prior functional level but PT did alone  Ambulation/Gait Ambulation/Gait assistance: Min assist Ambulation Distance (Feet): 2 Feet Assistive device: 1 person hand held assist Gait Pattern/deviations: Step-to pattern;Wide base of support;Decreased step length - left;Decreased step length - right;Decreased  dorsiflexion - right;Decreased dorsiflexion - left Gait velocity: slow Gait velocity interpretation: Below normal speed for age/gender General Gait Details: flexed posture after extended standing to get bowel movement clean before getting inchair.     Stairs            Wheelchair Mobility    Modified Rankin (Stroke Patients Only)       Balance Overall balance assessment: Needs assistance Sitting-balance support: Feet supported;Bilateral upper extremity supported Sitting balance-Leahy Scale: Fair   Postural control: Posterior lean Standing balance support: Bilateral upper extremity supported Standing balance-Leahy Scale: Poor Standing balance comment: Minimal help to remain upright mainly to manage his endurance changes                    Cognition Arousal/Alertness: Awake/alert Behavior During Therapy: WFL for tasks assessed/performed Overall Cognitive Status: Within Functional Limits for tasks assessed                 General Comments: Pt followed instructions well and  could follow cues for upright posture during standing    Exercises General Exercises - Lower Extremity Ankle Circles/Pumps: AROM;Both;5 reps Quad Sets: AROM;Both;5 reps Hip ABduction/ADduction: AROM;Both;10 reps    General Comments General comments (skin integrity, edema, etc.): Skin is mildly bruised and small open areas lower legs, larger heel wounds      Pertinent Vitals/Pain Pain Assessment: No/denies pain BP was 170/84, pulse 81 and O2 sat 94% per nsg notes.    Home Living                      Prior Function  PT Goals (current goals can now be found in the care plan section) Acute Rehab PT Goals Patient Stated Goal: to get home Progress towards PT goals: Progressing toward goals    Frequency  Min 3X/week    PT Plan Current plan remains appropriate    Co-evaluation             End of Session Equipment Utilized During Treatment: Gait  belt Activity Tolerance: Patient tolerated treatment well (mild SOB briefly then after pursed lip breathing was recover) Patient left: in chair;with call bell/phone within reach;with chair alarm set;with family/visitor present     Time: 2761-4709 PT Time Calculation (min): 32 min  Charges:  $Therapeutic Exercise: 8-22 mins $Therapeutic Activity: 8-22 mins                    G Codes:      Ramond Dial 2014/06/28, 10:25 AM Mee Hives, PT MS Acute Rehab Dept. Number: [phone]

## 2014-06-21 NOTE — Progress Notes (Signed)
Physical Medicine and Rehabilitation Consult  Reason for Consult: Multi-medical/seizure  Referring Physician: Critical care  HPI: Chad Morse is a 73 y.o. right-handed male with history of COPD, atrial fibrillation on Eliquis and seizure disorder. Patient lives with his wife uses a rolling walker prior to admission. Admitted 06/11/2014 after a fall at home when standing from a sitting position and struck his head. There was reports of brief unresponsiveness. He was initially conversant in the EEG and had a seizure requiring intubation for airway protection. Cranial CT scan negative for acute abnormalities and followup MRI negative for acute changes. CT angiogram chest negative. Patient initially loaded with Keppra later discontinued secondary to agitation felt this was possibly contributing to his restlessness. No further seizure activity noted. Suspect hypomagnesemia as well as hypocalcemia possibly related to his seizures. EEG with moderate diffuse swelling no seizure activity noted generalized cerebral dysfunction suspect encephalopathy. Patient's Eliquis to be resumed for history of atrial fibrillation. All cardiac markers remained negative. Patient was extubated 06/17/2014. Physical therapy evaluation completed 06/19/2014 with recommendations of physical medicine rehabilitation consult.  Patient on Cardizem drip, modified barium swallow pending. Currently n.p.o. for meds.  Foley discontinued  Per RN, occasional bowel incontinence, patient not aware  Review of Systems  Cardiovascular: Positive for palpitations.  Gastrointestinal:  GERD  Musculoskeletal: Positive for myalgias.  Neurological: Positive for seizures.   Past Medical History   Diagnosis  Date   .  COPD (chronic obstructive pulmonary disease)    .  Diabetes mellitus    .  Atrial fibrillation    .  Peripheral neuropathy    .  Gallstones    .  Peripheral vascular disease    .  IBS (irritable bowel syndrome)    .  Peripheral  arterial disease    .  Tobacco abuse    .  Hypertension    .  Hyperlipidemia     Past Surgical History   Procedure  Laterality  Date   .  Back surgery      History reviewed. No pertinent family history.  Social History: reports that he has been smoking Cigarettes. He has been smoking about 0.00 packs per day for the past 60 years. He has never used smokeless tobacco. He reports that he does not drink alcohol or use illicit drugs.  Allergies:  Allergies   Allergen  Reactions   .  Ciprofloxacin  Hives   .  Penicillins  Hives   .  Sulfonamide Derivatives  Hives    Medications Prior to Admission   Medication  Sig  Dispense  Refill   .  allopurinol (ZYLOPRIM) 100 MG tablet  Take 100 mg by mouth daily.     Marland Kitchen  ALPRAZolam (XANAX) 1 MG tablet  Take 1 mg by mouth daily as needed for anxiety or sleep.     Marland Kitchen  apixaban (ELIQUIS) 5 MG TABS tablet  Take 1 tablet (5 mg total) by mouth 2 (two) times daily.  60 tablet  3   .  baclofen (LIORESAL) 20 MG tablet  Take 10-20 mg by mouth 2 (two) times daily. Take 10 mg morning, take 20 mg noon, 10 mg at 6 pm and 20 mg 11 pm.     .  cilostazol (PLETAL) 50 MG tablet  Take 50 mg by mouth 2 (two) times daily.     Marland Kitchen  diltiazem (CARDIZEM CD) 120 MG 24 hr capsule  Take 1 capsule (120 mg total) by mouth daily.  90 capsule  2   .  folic acid (FOLVITE) 1 MG tablet  Take 1 mg by mouth daily.     .  Folic Acid-Vit M8-UXL K44 (FOLBEE) 2.5-25-1 MG TABS tablet  Take 1 tablet by mouth daily.  90 tablet  2   .  furosemide (LASIX) 40 MG tablet  Take 40 mg by mouth daily.     Marland Kitchen  HYDROcodone-acetaminophen (NORCO) 10-325 MG per tablet  Take 1 tablet by mouth 3 (three) times daily. For pain     .  KLOR-CON M20 20 MEQ tablet  Take 20 mEq by mouth daily.     .  metFORMIN (GLUCOPHAGE) 500 MG tablet  Take 500 mg by mouth 2 (two) times daily.     .  metoprolol (LOPRESSOR) 50 MG tablet  Take 25 mg by mouth every evening.     .  mupirocin cream (BACTROBAN) 2 %  Apply 1 application  topically as needed (for skin irritation).     .  ondansetron (ZOFRAN) 4 MG tablet  Take 1 tablet (4 mg total) by mouth every 6 (six) hours.  12 tablet  0   .  pantoprazole (PROTONIX) 40 MG tablet  Take 1 tablet (40 mg total) by mouth daily.  90 tablet  3   .  ramipril (ALTACE) 2.5 MG capsule  Take 1 capsule (2.5 mg total) by mouth daily.  90 capsule  2   .  simvastatin (ZOCOR) 20 MG tablet  Take 1 tablet (20 mg total) by mouth every evening.  30 tablet  6   .  SPIRIVA HANDIHALER 18 MCG inhalation capsule  Place 18 mcg into inhaler and inhale daily as needed (for shortness of breath).     Marland Kitchen  tiZANidine (ZANAFLEX) 4 MG tablet  Take 2-4 mg by mouth 4 (four) times daily. 6 am- half tab  11 am- 1 tab  6 pm- half tab  11 pm- 2 tabs     .  traZODone (DESYREL) 50 MG tablet  Take 100 mg by mouth at bedtime.     Marland Kitchen  zolpidem (AMBIEN CR) 12.5 MG CR tablet  Take 12.5 mg by mouth daily.     .  cefUROXime (CEFTIN) 250 MG tablet  Take 1 tablet (250 mg total) by mouth 2 (two) times daily with a meal.  14 tablet  0    Home:  Home Living  Family/patient expects to be discharged to:: Inpatient rehab  Living Arrangements: Spouse/significant other  Additional Comments: has a lift chair; uses rollator RW; up till the two weeks prior to this admission, pt and wife went out to dinner 3x/week  Functional History:  Prior Function  Level of Independence: Independent with assistive device(s)  Functional Status:  Mobility:  Bed Mobility  Overal bed mobility: Needs Assistance  Bed Mobility: Supine to Sit  Supine to sit: +2 for physical assistance;Max assist  General bed mobility comments: Cues to initiate and for technique; max assist to elevate trunk and square off hips at EOB  Transfers  Overall transfer level: Needs assistance  Equipment used: 2 person hand held assist (and bil trunk/hip support at belt)  Transfers: Stand Pivot Transfers  Stand pivot transfers: Max assist;+2 physical assistance  General  transfer comment: Pt anxious during transfer to chair (placed on his R side); Required bil knee blocking for stability; he yelled out during transfer, but later said it was from being scared, not from pain    ADL:   Cognition:  Cognition  Overall  Cognitive Status: Impaired/Different from baseline  Orientation Level: Oriented to person;Oriented to place;Oriented to situation  Cognition  Arousal/Alertness: Awake/alert  Behavior During Therapy: WFL for tasks assessed/performed  Overall Cognitive Status: Impaired/Different from baseline  Area of Impairment: Orientation  Orientation Level: Disoriented to;Place;Situation;Time  General Comments: Gave his own street address when asked where we are  Blood pressure 168/75, pulse 80, temperature 97.8 F (36.6 C), temperature source Oral, resp. rate 18, height 5\' 11"  (1.803 m), weight 101.6 kg (223 lb 15.8 oz), SpO2 94.00%.  Physical Exam  Constitutional: He appears well-developed.  HENT:  Head: Normocephalic.  Eyes: EOM are normal.  Neck: Normal range of motion. Neck supple. No thyromegaly present.  Cardiovascular:  Cardiac rate controlled  Respiratory: Effort normal and breath sounds normal. No respiratory distress.  GI: Soft. Bowel sounds are normal. He exhibits no distension.  Neurological: He is alert.  Patient sitting up at bedside using the urinal. He made good eye contact with examiner. He was able to state his name age as well as date of birth. Follows simple commands. He did have some delay in higher cognitive functioning and limited medical historian.  Skin: Skin is warm and dry.  motor strength is 4/5 bilateral deltoid, bicep, tricep, grip  3 minus bilaterally hip flexors, 4 minus knee extensors, 4 minus ankle dorsiflexor plantar flexor  Sensation reduced to light touch bilateral lower extremity  Results for orders placed during the hospital encounter of 06/10/14 (from the past 24 hour(s))   GLUCOSE, CAPILLARY Status: Abnormal     Collection Time    06/19/14 7:36 AM   Result  Value  Ref Range    Glucose-Capillary  144 (*)  70 - 99 mg/dL    Comment 1  Capillary Sample    GLUCOSE, CAPILLARY Status: Abnormal    Collection Time    06/19/14 11:28 AM   Result  Value  Ref Range    Glucose-Capillary  201 (*)  70 - 99 mg/dL    Comment 1  Capillary Sample    GLUCOSE, CAPILLARY Status: Abnormal    Collection Time    06/19/14 4:48 PM   Result  Value  Ref Range    Glucose-Capillary  213 (*)  70 - 99 mg/dL    Comment 1  Capillary Sample    GLUCOSE, CAPILLARY Status: Abnormal    Collection Time    06/19/14 7:38 PM   Result  Value  Ref Range    Glucose-Capillary  152 (*)  70 - 99 mg/dL    Comment 1  Capillary Sample    GLUCOSE, CAPILLARY Status: Abnormal    Collection Time    06/19/14 11:32 PM   Result  Value  Ref Range    Glucose-Capillary  131 (*)  70 - 99 mg/dL    Comment 1  Capillary Sample    BASIC METABOLIC PANEL Status: Abnormal    Collection Time    06/20/14 4:00 AM   Result  Value  Ref Range    Sodium  143  137 - 147 mEq/L    Potassium  3.0 (*)  3.7 - 5.3 mEq/L    Chloride  98  96 - 112 mEq/L    CO2  34 (*)  19 - 32 mEq/L    Glucose, Bld  152 (*)  70 - 99 mg/dL    BUN  28 (*)  6 - 23 mg/dL    Creatinine, Ser  0.67  0.50 - 1.35 mg/dL    Calcium  8.8  8.4 - 10.5 mg/dL    GFR calc non Af Amer  >90  >90 mL/min    GFR calc Af Amer  >90  >90 mL/min    Anion gap  11  5 - 15   MAGNESIUM Status: None    Collection Time    06/20/14 4:00 AM   Result  Value  Ref Range    Magnesium  1.9  1.5 - 2.5 mg/dL   PHOSPHORUS Status: None    Collection Time    06/20/14 4:00 AM   Result  Value  Ref Range    Phosphorus  2.8  2.3 - 4.6 mg/dL   CBC Status: Abnormal    Collection Time    06/20/14 4:00 AM   Result  Value  Ref Range    WBC  19.4 (*)  4.0 - 10.5 K/uL    RBC  6.41 (*)  4.22 - 5.81 MIL/uL    Hemoglobin  19.1 (*)  13.0 - 17.0 g/dL    HCT  57.0 (*)  39.0 - 52.0 %    MCV  88.9  78.0 - 100.0 fL    MCH  29.8   26.0 - 34.0 pg    MCHC  33.5  30.0 - 36.0 g/dL    RDW  15.9 (*)  11.5 - 15.5 %    Platelets  222  150 - 400 K/uL   GLUCOSE, CAPILLARY Status: Abnormal    Collection Time    06/20/14 4:03 AM   Result  Value  Ref Range    Glucose-Capillary  153 (*)  70 - 99 mg/dL    Comment 1  Capillary Sample     Mr Brain Wo Contrast  06/18/2014 CLINICAL DATA: Patient fell at home today and struck head. Brief unresponsiveness. History of diabetes, atrial fibrillation, and hypertension. Also history of seizures. EXAM: MRI HEAD WITHOUT CONTRAST TECHNIQUE: Multiplanar, multiecho pulse sequences of the brain and surrounding structures were obtained without intravenous contrast. COMPARISON: CT head 06/16/2014. MR head 06/12/2014. FINDINGS: Motion degraded exam. Images are suboptimal. No evidence for acute infarction, hemorrhage, mass lesion, hydrocephalus, or extra-axial fluid. Generalized atrophy. Mild subcortical and periventricular T2 and FLAIR hyperintensities, likely chronic microvascular ischemic change. Remote LEFT cerebellar infarct. Flow voids are maintained throughout the carotid, basilar, and RIGHT vertebral arteries. LEFT vertebral poorly seen, likely terminating in PICA, similar to previous study. There are no areas of chronic hemorrhage. No temporal lobe asymmetry on coronal T2 weighted imaging. Pituitary, pineal, and cerebellar tonsils unremarkable. No upper cervical lesions. Visualized calvarium, skull base, and upper cervical osseous structures unremarkable. Scalp and extracranial soft tissues, orbits, and sinuses show no acute process. Small BILATERAL mastoid effusions. Compared with prior CT and MR, there is good general agreement. IMPRESSION: Chronic changes as described. No acute intracranial findings. Similar appearance to priors. No visible posttraumatic sequelae. Electronically Signed By: Rolla Flatten M.D. On: 06/18/2014 16:49   Assessment/Plan:  Diagnosis: D. 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 Does the need for close, 24 hr/day medical supervision in concert with the patient's rehab needs make it unreasonable for this patient to be served in a less intensive setting? Potentially 2. Co-Morbidities requiring supervision/potential complications: Diabetes with peripheral neuropathy, atrial fibrillation with rapid ventricular response 3. Due to bladder management, bowel management, safety, skin/wound care, disease management, medication administration and patient education, does the patient require 24 hr/day rehab nursing? Potentially 4. Does the patient require coordinated care of a physician, rehab  nurse, PT (1-2 hrs/day, 5 days/week), OT (1-2 hrs/day, 5 days/week) and SLP (0.51 hrs/day, 5 days/week) to address physical and functional deficits in the context of the above medical diagnosis(es)? Yes Addressing deficits in the following areas: balance, endurance, locomotion, strength, transferring, bowel/bladder control, bathing, dressing, feeding, grooming, toileting and swallowing 5. Can the patient actively participate in an intensive therapy program of at least 3 hrs of therapy per day at least 5 days per week? Potentially 6. The potential for patient to make measurable gains while on inpatient rehab is good 7. Anticipated functional outcomes upon discharge from inpatient rehab are supervision with PT, supervision with OT, supervision with SLP. 8. Estimated rehab length of stay to reach the above functional goals is: 10-14 days 9. Does the patient have adequate social supports to accommodate these discharge functional goals? Yes 10. Anticipated D/C setting: Home 11. Anticipated post D/C treatments: [place] therapy 12. Overall Rehab/Functional Prognosis: good RECOMMENDATIONS:  This patient's condition is appropriate for continued rehabilitative care in the following setting: CIR  Patient has agreed to participate in recommended program. Potentially  Note  that insurance prior authorization may be required for reimbursement for recommended care.  Comment: Needs to be off Cardizem drip tolerating po antiarrythmic prior to CIR, MBS pending  06/20/2014  Revision History...      Date/Time User Action    06/20/2014 8:58 AM Charlett Blake, MD Sign    06/20/2014 7:03 AM Cathlyn Parsons, PA-C [place]   View Details Report    Routing History.Marland KitchenMarland Kitchen

## 2014-06-21 NOTE — Progress Notes (Signed)
CSW (Clinical Education officer, museum) aware that pt will dc to CIR today. Pt has no further hospital social work needs. CSW signing off.  [place], [place]

## 2014-06-21 NOTE — Progress Notes (Signed)
PMR Admission Coordinator Pre-Admission Assessment  Patient: Chad Morse is an 73 y.o., male  MRN: [id number]  DOB: 1941-02-14  Height: 5\' 11"  (180.3 cm)  Weight: 87.952 kg (193 lb 14.4 oz)  Insurance Information  HMO: PPO: PCP: IPA: 80/20: OTHER:  PRIMARY: Medicare A/B Policy#: [id number] Subscriber: Gean Birchwood  CM Name: Phone#: Fax#:  Pre-Cert#: Employer: Retired  Benefits: Phone #: Name: Checked in [place]. Date: 02/15/04 Deduct: $1260 Out of Pocket Max: none Life Max: unlimited  CIR: 100% SNF: 100 days  Outpatient: 80% Co-Pay: 20%  Home Health: 100% Co-Pay: none  DME: 80% Co-Pay: 20%  Providers: patient's choice   SECONDARY: U[employer] Policy#: [id number] Subscriber: Lavone Orn  CM Name: Phone#: Fax#:  Pre-Cert#: Employer: Retired  Benefits: Phone #: [phone] Name:  Eff. Date: Deduct: Out of Pocket Max: Life Max:  CIR: SNF:  Outpatient: Co-Pay:  Home Health: Co-Pay:  DME: Co-Pay:  Emergency Contact Information  Contact Information    Name  Relation  Home  Work  Mobile    [place]  Spouse  [phone]   [phone]    [place]  Daughter    [phone]      Current Medical History  Patient Admitting Diagnosis: Deconditioned after seizure and respiratory failure  History of Present Illness: A 73 y.o. right-handed male with history of COPD, atrial fibrillation on Eliquis and seizure disorder. Patient lives with his wife uses a rolling walker prior to admission. Admitted 06/11/2014 after a fall at home when standing from a sitting position and struck his head. There was reports of brief unresponsiveness. He was initially conversant in the EEG and had a seizure requiring intubation for airway protection. Cranial CT scan negative for acute abnormalities and followup MRI negative for acute changes. CT angiogram chest negative. Patient initially loaded with Keppra later discontinued secondary to agitation felt this was possibly contributing to his  restlessness. No further seizure activity noted. Suspect hypomagnesemia as well as hypocalcemia possibly related to his seizures. EEG with moderate diffuse swelling no seizure activity noted generalized cerebral dysfunction suspect encephalopathy. Patient's Eliquis to be resumed for history of atrial fibrillation. All cardiac markers remained negative. Patient was extubated 06/17/2014. Physical therapy evaluation completed 06/19/2014 with recommendations of physical medicine rehabilitation consult.  Past Medical History  Past Medical History   Diagnosis  Date   .  COPD (chronic obstructive pulmonary disease)    .  Diabetes mellitus    .  Atrial fibrillation    .  Peripheral neuropathy    .  Gallstones    .  Peripheral vascular disease    .  IBS (irritable bowel syndrome)    .  Peripheral arterial disease    .  Tobacco abuse    .  Hypertension    .  Hyperlipidemia     Family History  family history is not on file.  Prior Rehab/Hospitalizations: Had rehab 12 years ago for leg weakness in house.  Current Medications  Current facility-administered medications:0.9 % sodium chloride infusion, 250 mL, Intravenous, PRN, Juanito Doom, MD, Last Rate: 20 mL/hr at 06/15/14 2000, 250 mL at 06/15/14 2000; albuterol (PROVENTIL) (2.5 MG/3ML) 0.083% nebulizer solution 2.5 mg, 2.5 mg, Nebulization, Q3H PRN, Juanito Doom, MD, 2.5 mg at 06/14/14 0848  antiseptic oral rinse (CPC / CETYLPYRIDINIUM CHLORIDE 0.05%) solution 7 mL, 7 mL, Mouth Rinse, QID, Juanito Doom, MD, 7 mL at 06/21/14 1200; apixaban (ELIQUIS) tablet 5 mg, 5 mg, Oral, BID, Juanito Doom, MD, 5 mg  at 06/21/14 0928; atorvastatin (LIPITOR) tablet 10 mg, 10 mg, Oral, q1800, Wilhelmina Mcardle, MD, 10 mg at 06/20/14 1908  aztreonam (AZACTAM) 1 g in dextrose 5 % 50 mL IVPB, 1 g, Intravenous, Q8H, Juanito Doom, MD, 1 g at 06/21/14 0354; bisacodyl (DULCOLAX) suppository 10 mg, 10 mg, Rectal, Daily PRN, Juanito Doom, MD;  chlorhexidine (PERIDEX) 0.12 % solution 15 mL, 15 mL, Mouth Rinse, BID, Juanito Doom, MD, 15 mL at 06/21/14 0818  dextrose 5 % solution, , Intravenous, Continuous, Brand Males, MD, Last Rate: 50 mL/hr at 06/21/14 1000, 1,000 mL at 06/21/14 1000; diltiazem (CARDIZEM) tablet 60 mg, 60 mg, Oral, 4 times per day, Juanito Doom, MD, 60 mg at 06/21/14 1218; furosemide (LASIX) tablet 40 mg, 40 mg, Oral, BID, Juanito Doom, MD, 40 mg at 06/21/14 0819  hydrALAZINE (APRESOLINE) injection 10-40 mg, 10-40 mg, Intravenous, Q4H PRN, Wilhelmina Mcardle, MD, 20 mg at 06/20/14 0204; insulin aspart (novoLOG) injection 0-9 Units, 0-9 Units, Subcutaneous, TID WC, Raylene Miyamoto, MD, 2 Units at 06/21/14 1216; ipratropium-albuterol (DUONEB) 0.5-2.5 (3) MG/3ML nebulizer solution 3 mL, 3 mL, Nebulization, Q6H, Juanito Doom, MD, 3 mL at 06/21/14 0826  metoprolol (LOPRESSOR) injection 2.5-5 mg, 2.5-5 mg, Intravenous, Q3H PRN, Wilhelmina Mcardle, MD, 5 mg at 06/19/14 0218; metoprolol tartrate (LOPRESSOR) tablet 12.5 mg, 12.5 mg, Oral, BID, Wilhelmina Mcardle, MD, 12.5 mg at 06/21/14 6568; polyethylene glycol (MIRALAX / GLYCOLAX) packet 17 g, 17 g, Oral, Daily PRN, Wilhelmina Mcardle, MD  potassium chloride 10 mEq in 100 mL IVPB, 10 mEq, Intravenous, Q1 Hr x 2, Brand Males, MD, 10 mEq at 06/21/14 1218; RESOURCE THICKENUP CLEAR, , Oral, PRN, Juanito Doom, MD  Patients Current Diet: Dysphagia  Precautions / Restrictions  Precautions  Precautions: Fall  Restrictions  Weight Bearing Restrictions: No  Prior Activity Level  Community (5-7x/wk): Went out 3-4 X a week, has not driven in the past 12 years.  Home Assistive Devices / Equipment  Home Assistive Devices/Equipment: Environmental consultant (specify type)  Prior Functional Level  Prior Function  Level of Independence: Needs assistance  ADL's / Homemaking Assistance Needed: wife assisted with socks and starting pants over feet, pt used back brush for feet.  Current  Functional Level  Cognition  Overall Cognitive Status: Within Functional Limits for tasks assessed  Orientation Level: Oriented to time;Oriented to person;Oriented to place  General Comments: Pt followed instructions well and could follow cues for upright posture during standing   Extremity Assessment  (includes Sensation/Coordination)  Upper Extremity Assessment: Defer to OT evaluation;Generalized weakness  Lower Extremity Assessment: Generalized weakness   ADLs  Overall ADL's : Needs assistance/impaired  Eating/Feeding: Supervision/ safety;Bed level  Eating/Feeding Details (indicate cue type and reason): pt on pureed food with thickened liquid, wants water but understands it must be thickened.  Grooming: Dance movement psychotherapist;Wash/dry hands;Set up;Sitting  Upper Body Bathing: Moderate assistance;Sitting  Lower Body Bathing: Total assistance;Sit to/from stand  Upper Body Dressing : Moderate assistance;Sitting  Lower Body Dressing: Total assistance;Sit to/from stand  Functional mobility during ADLs: +2 for physical assistance;Moderate assistance   Mobility  Overal bed mobility: Needs Assistance  Bed Mobility: Sidelying to Sit  Sidelying to sit: Min assist  Supine to sit: +2 for physical assistance;Max assist  Sit to supine: +2 for physical assistance;Max assist  General bed mobility comments: Cues to initiate and for technique; max assist to elevate trunk and square off hips at EOB   Transfers  Overall transfer level:  Needs assistance  Equipment used: 1 person hand held assist  Transfers: Sit to/from [facility]  Sit to Stand: Min assist  Stand pivot transfers: Min assist  General transfer comment: Had another person to stand by due to prior functional level but PT did alone   Ambulation / Gait / Stairs / Wheelchair Mobility  Ambulation/Gait  Ambulation/Gait assistance: Fish farm manager (Feet): 2 Feet  Assistive device: 1 person hand held assist  Gait  Pattern/deviations: Step-to pattern;Wide base of support;Decreased step length - left;Decreased step length - right;Decreased dorsiflexion - right;Decreased dorsiflexion - left  Gait velocity: slow  Gait velocity interpretation: Below normal speed for age/gender  General Gait Details: flexed posture after extended standing to get bowel movement clean before getting inchair.   Posture / Balance  Dynamic Sitting Balance  Sitting balance - Comments: needed at least one hand on bed for balance   Special needs/care consideration  BiPAP/CPAP No  CPM No  Continuous Drip IV Has a peripheral IV  Dialysis No  Life Vest No  Oxygen Yes, on 4L [place] in hospital, but not on O2 at home  Special Bed No  Trach Size No  Wound Vac (area) No  Skin Lower legs with dry skin and scabbed areas  Bowel mgmt: Incontinent of stool 06/21/14  Bladder mgmt: Has a condom catheter  Diabetic mgmt Yes, on oral medications at home, but on insulin in hospital.   Previous Home Environment  Living Arrangements: Spouse/significant other  Home Care Services: No  Additional Comments: has a lift chair; uses rollator RW; up till the two weeks prior to this admission, pt and wife went out to dinner 3x/week  Discharge Living Setting  Plans for Discharge Living Setting: Patient's home;House;Lives with (comment) (Lives with wife.)  Type of Home at Discharge: House  Discharge Home Layout: One level  Discharge Home Access: Ramped entrance  Does the patient have any problems obtaining your medications?: No  Social/Family/Support Systems  Patient Roles: Spouse;Parent (Has a husband and a 97 yr old daughter.)  Contact Information: Demarious Kapur - wife  Anticipated Caregiver: wife  Anticipated Caregiver's Contact Information: Jana Half - wife (h) [phone] (c) [phone]  Ability/Limitations of Caregiver: Wife can assist. Dtr had previous MI and works in a [employer] firm. Son died 12 yrs ago from MI.  Caregiver Availability: 24/7   Discharge Plan Discussed with Primary Caregiver: Yes  Is Caregiver In Agreement with Plan?: Yes  Does Caregiver/Family have Issues with Lodging/Transportation while Pt is in Rehab?: No  Goals/Additional Needs  Patient/Family Goal for Rehab: PT/OT/ST supervision goals  Expected length of stay: 10-14 days  Cultural Considerations: Christian  Dietary Needs: Dys !, pudding thick liquids  Equipment Needs: TBD  Additional Information: Has son-in-law who works 911 call center on 2 days, off 2 days who can help when off. Dtr can help when not working.  Pt/Family Agrees to Admission and willing to participate: Yes  Program Orientation Provided & Reviewed with Pt/Caregiver Including Roles & Responsibilities: Yes  Decrease burden of Care through IP rehab admission: N/A  Possible need for SNF placement upon discharge: Not planned  Patient Condition: This patient's condition remains as documented in the consult dated 06/20/14, in which the Rehabilitation Physician determined and documented that the patient's condition is appropriate for intensive rehabilitative care in an inpatient rehabilitation facility. Will admit to inpatient rehab today.  Preadmission Screen Completed By: Retta Diones, 06/21/2014 12:31 PM  ______________________________________________________________________  Discussed status with Dr. Naaman Plummer on 06/21/14  at 1229 and received telephone approval for admission today.  Admission Coordinator: Retta Diones, time1229/Date10/06/15  Cosigned by: Meredith Staggers, MD [06/21/2014 1:23 PM]

## 2014-06-21 NOTE — Plan of Care (Signed)
Problem: RH BLADDER ELIMINATION Goal: RH STG MANAGE BLADDER WITH ASSISTANCE STG Manage Bladder With min Assistance Outcome: Not Progressing incont at this time- using condom cath  Problem: RH SKIN INTEGRITY Goal: RH STG SKIN FREE OF INFECTION/BREAKDOWN Outcome: Not Progressing Stage 2 noted to sacrum

## 2014-06-21 NOTE — Progress Notes (Signed)
See my independent note  Dr. Brand Males, M.D., [facility].C.P Pulmonary and Critical Care Medicine Staff Physician [facility] Pager: [phone], If no answer or between  15:00h - 7:00h: call [phone]  06/21/2014 9:51 AM

## 2014-06-21 NOTE — H&P (Signed)
Physical Medicine and Rehabilitation Admission H&P  Chief Complaint   Patient presents with   .  Tachycardia   :  HPI: Chad Morse is a 73 y.o. right-handed male with history of COPD, atrial fibrillation on Eliquis and remote seizure disorder. Patient on no anti-seizure medication prior to admission. Patient lives with his wife uses a rolling walker prior to admission. Admitted 06/11/2014 after a fall at home when standing from a sitting position and struck his head. There was reports of brief unresponsiveness. He was initially conversant in the ED and had a seizure requiring intubation for airway protection. Noted calcium level 6.1 and magnesium level 0.6 on admission. Cranial CT scan negative for acute abnormalities and followup MRI negative for acute changes. CT angiogram chest negative. Patient initially loaded with Keppra later discontinued secondary to agitation felt this was possibly contributing to his restlessness. No further seizure activity noted and currently on no anti-seizure medication. Suspect hypomagnesemia as well as hypocalcemia possibly related to his seizures. EEG with moderate diffuse swelling no seizure activity noted generalized cerebral dysfunction suspect encephalopathy. Patient's Eliquis to be resumed for history of atrial fibrillation. All cardiac markers remained negative. Patient was extubated 06/17/2014. Currently maintained on a dysphagia 1 pudding thick liquid diet. Physical and occupational therapy evaluations completed 06/19/2014 with recommendations of physical medicine rehabilitation consult. Patient was admitted for cognitive rehabilitation program  ROS Review of Systems  Cardiovascular: Positive for palpitations.  Gastrointestinal:  GERD  Musculoskeletal: Positive for myalgias.  Neurological: Positive for seizures/anxiety  Remaining review of systems negative  Past Medical History   Diagnosis  Date   .  COPD (chronic obstructive pulmonary disease)    .   Diabetes mellitus    .  Atrial fibrillation    .  Peripheral neuropathy    .  Gallstones    .  Peripheral vascular disease    .  IBS (irritable bowel syndrome)    .  Peripheral arterial disease    .  Tobacco abuse    .  Hypertension    .  Hyperlipidemia     Past Surgical History   Procedure  Laterality  Date   .  Back surgery      History reviewed. No pertinent family history.  Social History: reports that he has been smoking Cigarettes. He has been smoking about 0.00 packs per day for the past 60 years. He has never used smokeless tobacco. He reports that he does not drink alcohol or use illicit drugs.  Allergies:  Allergies   Allergen  Reactions   .  Ciprofloxacin  Hives   .  Penicillins  Hives   .  Sulfonamide Derivatives  Hives    Medications Prior to Admission   Medication  Sig  Dispense  Refill   .  allopurinol (ZYLOPRIM) 100 MG tablet  Take 100 mg by mouth daily.     Marland Kitchen  ALPRAZolam (XANAX) 1 MG tablet  Take 1 mg by mouth daily as needed for anxiety or sleep.     Marland Kitchen  apixaban (ELIQUIS) 5 MG TABS tablet  Take 1 tablet (5 mg total) by mouth 2 (two) times daily.  60 tablet  3   .  baclofen (LIORESAL) 20 MG tablet  Take 10-20 mg by mouth 2 (two) times daily. Take 10 mg morning, take 20 mg noon, 10 mg at 6 pm and 20 mg 11 pm.     .  cilostazol (PLETAL) 50 MG tablet  Take 50 mg by mouth  2 (two) times daily.     Marland Kitchen  diltiazem (CARDIZEM CD) 120 MG 24 hr capsule  Take 1 capsule (120 mg total) by mouth daily.  90 capsule  2   .  folic acid (FOLVITE) 1 MG tablet  Take 1 mg by mouth daily.     .  Folic Acid-Vit S2-GBT D17 (FOLBEE) 2.5-25-1 MG TABS tablet  Take 1 tablet by mouth daily.  90 tablet  2   .  furosemide (LASIX) 40 MG tablet  Take 40 mg by mouth daily.     Marland Kitchen  HYDROcodone-acetaminophen (NORCO) 10-325 MG per tablet  Take 1 tablet by mouth 3 (three) times daily. For pain     .  KLOR-CON M20 20 MEQ tablet  Take 20 mEq by mouth daily.     .  metFORMIN (GLUCOPHAGE) 500 MG tablet   Take 500 mg by mouth 2 (two) times daily.     .  metoprolol (LOPRESSOR) 50 MG tablet  Take 25 mg by mouth every evening.     .  mupirocin cream (BACTROBAN) 2 %  Apply 1 application topically as needed (for skin irritation).     .  ondansetron (ZOFRAN) 4 MG tablet  Take 1 tablet (4 mg total) by mouth every 6 (six) hours.  12 tablet  0   .  pantoprazole (PROTONIX) 40 MG tablet  Take 1 tablet (40 mg total) by mouth daily.  90 tablet  3   .  ramipril (ALTACE) 2.5 MG capsule  Take 1 capsule (2.5 mg total) by mouth daily.  90 capsule  2   .  simvastatin (ZOCOR) 20 MG tablet  Take 1 tablet (20 mg total) by mouth every evening.  30 tablet  6   .  SPIRIVA HANDIHALER 18 MCG inhalation capsule  Place 18 mcg into inhaler and inhale daily as needed (for shortness of breath).     Marland Kitchen  tiZANidine (ZANAFLEX) 4 MG tablet  Take 2-4 mg by mouth 4 (four) times daily. 6 am- half tab  11 am- 1 tab  6 pm- half tab  11 pm- 2 tabs     .  traZODone (DESYREL) 50 MG tablet  Take 100 mg by mouth at bedtime.     Marland Kitchen  zolpidem (AMBIEN CR) 12.5 MG CR tablet  Take 12.5 mg by mouth daily.     .  cefUROXime (CEFTIN) 250 MG tablet  Take 1 tablet (250 mg total) by mouth 2 (two) times daily with a meal.  14 tablet  0    Home:  Home Living  Family/patient expects to be discharged to:: Inpatient rehab  Living Arrangements: Spouse/significant other  Additional Comments: has a lift chair; uses rollator RW; up till the two weeks prior to this admission, pt and wife went out to dinner 3x/week  Functional History:  Prior Function  Level of Independence: Needs assistance  ADL's / Homemaking Assistance Needed: wife assisted with socks and starting pants over feet, pt used back brush for feet.  Functional Status:  Mobility:  Bed Mobility  Overal bed mobility: Needs Assistance  Bed Mobility: Sidelying to Sit  Sidelying to sit: Min assist  Supine to sit: +2 for physical assistance;Max assist  Sit to supine: +2 for physical assistance;Max  assist  General bed mobility comments: Cues to initiate and for technique; max assist to elevate trunk and square off hips at EOB  Transfers  Overall transfer level: Needs assistance  Equipment used: 1 person hand held assist  Transfers: Sit to/from [employer] to Stand: Min assist  Stand pivot transfers: Min assist  General transfer comment: Had another person to stand by due to prior functional level but PT did alone  Ambulation/Gait  Ambulation/Gait assistance: Min assist  Ambulation Distance (Feet): 2 Feet  Assistive device: 1 person hand held assist  Gait Pattern/deviations: Step-to pattern;Wide base of support;Decreased step length - left;Decreased step length - right;Decreased dorsiflexion - right;Decreased dorsiflexion - left  Gait velocity: slow  Gait velocity interpretation: Below normal speed for age/gender  General Gait Details: flexed posture after extended standing to get bowel movement clean before getting inchair.   ADL:  ADL  Overall ADL's : Needs assistance/impaired  Eating/Feeding: Supervision/ safety;Bed level  Eating/Feeding Details (indicate cue type and reason): pt on pureed food with thickened liquid, wants water but understands it must be thickened.  Grooming: Dance movement psychotherapist;Wash/dry hands;Set up;Sitting  Upper Body Bathing: Moderate assistance;Sitting  Lower Body Bathing: Total assistance;Sit to/from stand  Upper Body Dressing : Moderate assistance;Sitting  Lower Body Dressing: Total assistance;Sit to/from stand  Functional mobility during ADLs: +2 for physical assistance;Moderate assistance  Cognition:  Cognition  Overall Cognitive Status: Within Functional Limits for tasks assessed  Orientation Level: Oriented to time;Oriented to person;Oriented to place  Cognition  Arousal/Alertness: Awake/alert  Behavior During Therapy: WFL for tasks assessed/performed  Overall Cognitive Status: Within Functional Limits for tasks assessed  Area of  Impairment: Orientation  Orientation Level: Disoriented to;Place;Situation;Time  General Comments: Pt followed instructions well and could follow cues for upright posture during standing  Physical Exam:  Blood pressure 170/84, pulse 81, temperature 98.1 F (36.7 C), temperature source Oral, resp. rate 22, height 5' 11" (1.803 m), weight 87.952 kg (193 lb 14.4 oz), SpO2 94.00%.  Constitutional: He appears well-developed.  HENT: oral mucosa moist Head: Normocephalic.  Eyes: EOM are normal.  Neck: Normal range of motion. Neck supple. No thyromegaly present.  Cardiovascular:  Cardiac rate controlled. No murmur or rubs. Reg rhythm. Respiratory: Effort normal and breath sounds normal. No respiratory distress. No wheezes GI: Soft. Bowel sounds are normal. He exhibits no distension.  Neurological: He is alert.  Patient sitting in bed. He made good eye contact with examiner. He was able to state his name age as well as date of birth. Follows simple commands. He did have some delay in higher cognitive functioning and limited medical historian. motor strength is 4/5 bilateral deltoid, bicep, tricep, grip  3 minus bilaterally hip flexors, 4 minus knee extensors, 4  ankle dorsiflexor plantar flexor  Sensation reduced to light touch bilateral lower extremities below the knees. DTR's 1+  Musculoskeletal: trace LE edema.  Skin: numerous small abrasions on legs. Small area of breakdown on the left lateral malleolus Psych: generally pleasant and cooperative.    Results for orders placed during the hospital encounter of 06/10/14 (from the past 48 hour(s))   GLUCOSE, CAPILLARY Status: Abnormal    Collection Time    06/19/14 4:48 PM   Result  Value  Ref Range    Glucose-Capillary  213 (*)  70 - 99 mg/dL    Comment 1  Capillary Sample    GLUCOSE, CAPILLARY Status: Abnormal    Collection Time    06/19/14 7:38 PM   Result  Value  Ref Range    Glucose-Capillary  152 (*)  70 - 99 mg/dL    Comment 1   Capillary Sample    GLUCOSE, CAPILLARY Status: Abnormal    Collection Time  06/19/14 11:32 PM   Result  Value  Ref Range    Glucose-Capillary  131 (*)  70 - 99 mg/dL    Comment 1  Capillary Sample    BASIC METABOLIC PANEL Status: Abnormal    Collection Time    06/20/14 4:00 AM   Result  Value  Ref Range    Sodium  143  137 - 147 mEq/L    Potassium  3.0 (*)  3.7 - 5.3 mEq/L    Chloride  98  96 - 112 mEq/L    CO2  34 (*)  19 - 32 mEq/L    Glucose, Bld  152 (*)  70 - 99 mg/dL    BUN  28 (*)  6 - 23 mg/dL    Creatinine, Ser  0.67  0.50 - 1.35 mg/dL    Calcium  8.8  8.4 - 10.5 mg/dL    GFR calc non Af Amer  >90  >90 mL/min    GFR calc Af Amer  >90  >90 mL/min    Comment:  (NOTE)     The eGFR has been calculated using the CKD EPI equation.     This calculation has not been validated in all clinical situations.     eGFR's persistently <90 mL/min signify possible Chronic Kidney     Disease.    Anion gap  11  5 - 15   MAGNESIUM Status: None    Collection Time    06/20/14 4:00 AM   Result  Value  Ref Range    Magnesium  1.9  1.5 - 2.5 mg/dL   PHOSPHORUS Status: None    Collection Time    06/20/14 4:00 AM   Result  Value  Ref Range    Phosphorus  2.8  2.3 - 4.6 mg/dL   CBC Status: Abnormal    Collection Time    06/20/14 4:00 AM   Result  Value  Ref Range    WBC  19.4 (*)  4.0 - 10.5 K/uL    RBC  6.41 (*)  4.22 - 5.81 MIL/uL    Hemoglobin  19.1 (*)  13.0 - 17.0 g/dL    HCT  57.0 (*)  39.0 - 52.0 %    MCV  88.9  78.0 - 100.0 fL    MCH  29.8  26.0 - 34.0 pg    MCHC  33.5  30.0 - 36.0 g/dL    RDW  15.9 (*)  11.5 - 15.5 %    Platelets  222  150 - 400 K/uL   GLUCOSE, CAPILLARY Status: Abnormal    Collection Time    06/20/14 4:03 AM   Result  Value  Ref Range    Glucose-Capillary  153 (*)  70 - 99 mg/dL    Comment 1  Capillary Sample    GLUCOSE, CAPILLARY Status: Abnormal    Collection Time    06/20/14 7:47 AM   Result  Value  Ref Range    Glucose-Capillary  137 (*)  70 -  99 mg/dL    Comment 1  Capillary Sample    GLUCOSE, CAPILLARY Status: Abnormal    Collection Time    06/20/14 11:48 AM   Result  Value  Ref Range    Glucose-Capillary  182 (*)  70 - 99 mg/dL    Comment 1  Capillary Sample    GLUCOSE, CAPILLARY Status: Abnormal    Collection Time    06/20/14 4:53 PM   Result  Value  Ref  Range    Glucose-Capillary  204 (*)  70 - 99 mg/dL    Comment 1  Documented in Chart     Comment 2  Notify RN    GLUCOSE, CAPILLARY Status: Abnormal    Collection Time    06/20/14 8:03 PM   Result  Value  Ref Range    Glucose-Capillary  192 (*)  70 - 99 mg/dL   BASIC METABOLIC PANEL Status: Abnormal    Collection Time    06/21/14 6:00 AM   Result  Value  Ref Range    Sodium  149 (*)  137 - 147 mEq/L    Potassium  3.2 (*)  3.7 - 5.3 mEq/L    Chloride  102  96 - 112 mEq/L    CO2  36 (*)  19 - 32 mEq/L    Glucose, Bld  148 (*)  70 - 99 mg/dL    BUN  30 (*)  6 - 23 mg/dL    Creatinine, Ser  0.79  0.50 - 1.35 mg/dL    Calcium  8.6  8.4 - 10.5 mg/dL    GFR calc non Af Amer  86 (*)  >90 mL/min    GFR calc Af Amer  >90  >90 mL/min    Comment:  (NOTE)     The eGFR has been calculated using the CKD EPI equation.     This calculation has not been validated in all clinical situations.     eGFR's persistently <90 mL/min signify possible Chronic Kidney     Disease.    Anion gap  11  5 - 15   PHOSPHORUS Status: None    Collection Time    06/21/14 6:00 AM   Result  Value  Ref Range    Phosphorus  2.8  2.3 - 4.6 mg/dL   MAGNESIUM Status: None    Collection Time    06/21/14 6:00 AM   Result  Value  Ref Range    Magnesium  1.9  1.5 - 2.5 mg/dL   GLUCOSE, CAPILLARY Status: Abnormal    Collection Time    06/21/14 7:48 AM   Result  Value  Ref Range    Glucose-Capillary  143 (*)  70 - 99 mg/dL    Dg Swallowing Func-speech Pathology  06/20/2014 Orbie Pyo [place], CCC-SLP 06/20/2014 10:03 AM Objective Swallowing Evaluation: Modified Barium Swallowing Study Patient  Details Name: Chad Morse MRN: [id number] Date of Birth: 1941-01-30 Today's Date: 06/20/2014 Time: 0905-0925 SLP Time Calculation (min): 20 min Past Medical History: Past Medical History Diagnosis Date . COPD (chronic obstructive pulmonary disease) . Diabetes mellitus . Atrial fibrillation . Peripheral neuropathy . Gallstones . Peripheral vascular disease . IBS (irritable bowel syndrome) . Peripheral arterial disease . Tobacco abuse . Hypertension . Hyperlipidemia Past Surgical History: Past Surgical History Procedure Laterality Date . Back surgery HPI: 73 y/o male with PMH: COPD, Afib, DM, PVD, IBS, HTN admitted after fall, struck his head with brief unresponsiveness. Had seizure in ED and was intubated. Per MD note pt has had very poor PO intake and has complained of his vision being blurry. CT negative for new abnormality, remote right basal ganglia and left cerebelluym. Extubated 9/29 and reintubaed 9/29-10/02. MRI revealed chronic changes as described. No acute intracranial findingsordered. CXR cardiomegaly with bilateral pulmonary infiltrates and small pleural effusions most consistent with congestive heart failure. Pt. continues to exhibit pharyngeal dysphagia, therefore, MBS recommended. Assessment / Plan / Recommendation Clinical Impression Dysphagia Diagnosis: Mild oral phase dysphagia;Moderate  pharyngeal phase dysphagia;Severe pharyngeal phase dysphagia Clinical impression: Pt. exhibited mild oral dysphagia with decreased mastication and delayed transit with solid texture. Moderate-severe pharyngeal dysphagia affected by an anatomical source due to what appears to be significant boney osteophytes. Epiglottis is unable to fully cover laryngeal vestibule due to osteophytes encroaching into pharyngeal space resulting in penetration onto vocal cords with reflexive cough. Current illness with intubation x 2 prevents him from naturally compensating as he had prior to admission. Prognosis appears good as he  gains strength and endurance. Vallecular and pyfirom sinus residue present with penetration once from residuals. Therapeutic intervention attempted with chin tuck without success with textures thinner than puree. Dys 1, pudding thick liquids recommended with double swallows and intentional coughs/throat clears. Treatment Recommendation Therapy as outlined in treatment plan below Diet Recommendation Dysphagia 1 (Puree);Pudding-thick liquid (nothing thinner than puree) Medication Administration: Crushed with puree Supervision: Patient able to self feed;Full supervision/cueing for compensatory strategies Compensations: Slow rate;Small sips/bites;Clear throat intermittently;Clear throat after each swallow;Multiple dry swallows after each bite/sip Postural Changes and/or Swallow Maneuvers: Seated upright 90 degrees;Upright 30-60 min after meal Other Recommendations Oral Care Recommendations: Oral care BID Other Recommendations: Order thickener from pharmacy Follow Up Recommendations (TBD) Frequency and Duration min 2x/week 2 weeks Pertinent Vitals/Pain No pain Reason for Referral Objectively evaluate swallowing function Oral Phase Oral Preparation/Oral Phase Oral Phase: Impaired Oral - Solids Oral - Regular: Delayed oral transit;Reduced posterior propulsion Pharyngeal Phase Pharyngeal Phase Pharyngeal Phase: Impaired Pharyngeal - Honey Pharyngeal - Honey Teaspoon: Penetration/Aspiration during swallow;Delayed swallow initiation;Premature spillage to valleculae;Reduced epiglottic inversion;Pharyngeal residue - valleculae;Pharyngeal residue - pyriform sinuses;Reduced tongue base retraction;Reduced laryngeal elevation Penetration/Aspiration details (honey teaspoon): Material enters airway, CONTACTS cords and not ejected out (reflexive throat clear) Pharyngeal - Honey Cup: Penetration/Aspiration during swallow;Pharyngeal residue - valleculae;Pharyngeal residue - pyriform sinuses;Reduced laryngeal elevation;Reduced epiglottic  inversion;Reduced airway/laryngeal closure (with chin tuck) Penetration/Aspiration details (honey cup): Material enters airway, remains ABOVE vocal cords and not ejected out Pharyngeal - Nectar Pharyngeal - Nectar Teaspoon: Penetration/Aspiration during swallow;Reduced epiglottic inversion;Pharyngeal residue - valleculae;Pharyngeal residue - pyriform sinuses;Reduced tongue base retraction;Reduced laryngeal elevation (chin tuck ineffective) Penetration/Aspiration details (nectar teaspoon): Material enters airway, CONTACTS cords and not ejected out Pharyngeal - Nectar Cup: Not tested Pharyngeal - Thin Pharyngeal - Thin Teaspoon: Penetration/Aspiration during swallow;Pharyngeal residue - valleculae;Pharyngeal residue - pyriform sinuses;Reduced tongue base retraction;Reduced laryngeal elevation;Reduced epiglottic inversion Penetration/Aspiration details (thin teaspoon): Material enters airway, CONTACTS cords and not ejected out (reflexive cough) Pharyngeal - Solids Pharyngeal - Puree: Delayed swallow initiation;Premature spillage to valleculae Pharyngeal - Regular: Pharyngeal residue - valleculae;Pharyngeal residue - pyriform sinuses;Reduced tongue base retraction;Reduced laryngeal elevation Cervical Esophageal Phase GO Cervical Esophageal Phase Cervical Esophageal Phase: [name] [name] 06/20/2014, 10:01 AM [name].Ed CCC-SLP Pager [phone]   Medical Problem List and Plan:  1. 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 DVT Prophylaxis/Anticoagulation: Eliquis.  3. Pain Management: Tylenol as needed  4. Dysphagia. Dysphagia 1 pudding thick liquids. Monitor hydration. Followup speech therapy  5. Neuropsych: This patient is not capable of making decisions on his own behalf.  6. Skin/Wound Care: Routine skin checks  7. Fluids/Electrolytes/Nutrition: Monitor electrolytes closely. Monitor magnesium and calcium levels Strict  I and O.'s. HS IV fluids for hydration  8. Atrial fibrillation. Continue Cardizem 60 mg 4 times a day, Lopressor 12.5 mg twice a day. Cardiac rate controlled  9. Diabetes mellitus. Check blood sugars a.c. and at bedtime. Sliding scale insulin. Patient on Glucophage  500 mg twice a day prior to admission. Resume his needed  10. Tobacco abuse /COPD. DuoNeb every 6 hours, Albuterol nebulizer as needed. Check oxygen saturations every shift. Patient on Spiriva 18 mcg daily prior to admission. Provide counseling for tobacco abuse  11. Hyperlipidemia. Lipitor    Post Admission Physician Evaluation:  1. 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 2. Patient is admitted to receive collaborative, interdisciplinary care between the physiatrist, rehab nursing staff, and therapy team. 3. Patient's level of medical complexity and substantial therapy needs in context of that medical necessity cannot be provided at a lesser intensity of care such as a SNF. 4. Patient has experienced substantial functional loss from his/her baseline which was documented above under the "Functional History" and "Functional Status" headings. Judging by the patient's diagnosis, physical exam, and functional history, the patient has potential for functional progress which will result in measurable gains while on inpatient rehab. These gains will be of substantial and practical use upon discharge in facilitating mobility and self-care at the household level. 5. Physiatrist will provide 24 hour management of medical needs as well as oversight of the therapy plan/treatment and provide guidance as appropriate regarding the interaction of the two. 6. 24 hour rehab nursing will assist with bladder management, bowel management, safety, skin/wound care, disease management, medication administration, pain management and patient education and help integrate therapy concepts,  techniques,education, etc. 7. PT will assess and treat for/with: Lower extremity strength, range of motion, stamina, balance, functional mobility, safety, adaptive techniques and equipment, activity tolerance, family education, ego support. Goals are: mod I to supervision for short dx mobility. 8. OT will assess and treat for/with: ADL's, functional mobility, safety, upper extremity strength, adaptive techniques and equipment, activity tolerance, family education, community reintegration, ego support. Goals are: mod I to min assist. Therapy may proceed with showering this patient. 9. SLP will assess and treat for/with: swallowing, communication. Goals are: mod I. 10. Case Management and Social Worker will assess and treat for psychological issues and discharge planning. 11. Team conference will be held weekly to assess progress toward goals and to determine barriers to discharge. 12. Patient will receive at least 3 hours of therapy per day at least 5 days per week. 13. ELOS: 13-18 days  14. Prognosis: excellent  Meredith Staggers, MD, [facility] 06/21/2014

## 2014-06-21 NOTE — Progress Notes (Signed)
Patient admitted about 1625 to 4W06. Patient wife at bedside. Patient alert and oriented x3. Patient came with condom cath, no foley to unit. Rehab process reviewed with family. Vitals stable.

## 2014-06-21 NOTE — Progress Notes (Addendum)
PULMONARY / CRITICAL CARE MEDICINE   Name: Chad Morse MRN: [id number] DOB: 23-Jul-1941    ADMISSION DATE:  06/10/2014 CONSULTATION DATE:  06/10/2014  REFERRING MD :  Rancour  CHIEF COMPLAINT:  Seizure  INITIAL PRESENTATION:  73 y/o male smoker with COPD and Afib who was admitted on 9/25 from the [facility] ED after he had fell at home when standing from a sitting position and struck his head.  He was briefly unresponsive afterwards so EMS was called.  He was initially conversant in the ED but then had seizure in the ED and was intubated for airway protection.     MAJOR EVENTS/TEST RESULTS: 06/10/2014 - admit, intubated 9/25 CT head > No Acute Intracranial pathology 9/25 CT angio chest/ab/pelv > No evidence of thoracic or abdominal aortic aneurysm or dissection, no PE, diffuse emphysema and fibrosis of the lungs, mild gallbladder wall distention with sludge, diverticulosis without inflammatory changes 9/26: restless but purposeful on WUA off diprivan. Not on pressors. 9/26 TTE: LVEF 65-70%. RV moderately dilated. 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 9/28 repeat EEG: mod diffuse slowing 9/29 reintubated for stridor, steroids started 10/01 repeat EEG: consistent with a mild nonspecific generalized cerebral dysfunction (encephalopathy). There was no seizure or seizure predisposition recorded on this study  10/01 CT head: No evidence of acute intracranial abnormality. Old, small infarcts are again noted in the right basal ganglia and left cerebellum.  10/02 Extubated. Tolerating but requiring NRB. Exhibits episodes of central apnea. Fentanyl dosing decreased. Lasix X 1 given 10/3 rpt MRI neg 06/20/14 - moved to tele 3w floor   DEVICES:: ETT 9/25 >> 9/29, 9/29 >> 10/02 L IJ CVL 9/29 >>   MICRO: Blood 9/25 >> NEG  Resp  9/29 >> H  flu  ANTIMICROBIALS:  Aztreonam 9/29 >> 10/01 Vanc 9/29 >> 10/01   SUBJECTIVE/OVERNIGHT/INTERVAL HX  06/21/14:  Lying in bed. Looks profoundly deconditioned. Wife asking when repeat swallow. He wants to sit up. He does not feel well enough to go  Home.Reports yes for all body part complaints. OT recommends inpatient rehab. Currently A Fib RVR 126 with PVC   VITAL SIGNS: Temp:  [98 F (36.7 C)-98.2 F (36.8 C)] 98.1 F (36.7 C) (10/06 0500) Pulse Rate:  [76-106] 81 (10/06 0500) Resp:  [20-22] 22 (10/06 0500) BP: (158-172)/(81-90) 170/84 mmHg (10/06 0500) SpO2:  [93 %-95 %] 94 % (10/06 0828) Weight:  [87.952 kg (193 lb 14.4 oz)] 87.952 kg (193 lb 14.4 oz) (10/06 0521) HEMODYNAMICS:   VENTILATOR SETTINGS:   INTAKE / OUTPUT:  Intake/Output Summary (Last 24 hours) at 06/21/14 0923 Last data filed at 06/21/14 0500  Gross per 24 hour  Intake    650 ml  Output    300 ml  Net    350 ml    PHYSICAL EXAMINATION: General:  No distress Neuro:  MAEs, diffusely weak. Some confused HEENT:  NCAT, PERRL, EOMI Cardiovascular: IRIR, no M Lungs: Clear anteriorly, cough strong Abdomen:  Soft, NT, +BS Ext: warm, no edema, chronic venous stasis changes  LABS: PULMONARY  Recent Labs Lab 06/14/14 1115  PHART 7.391  PCO2ART 55.6*  PO2ART 75.6*  HCO3 32.9*  TCO2 34.7  O2SAT 94.2    CBC  Recent Labs Lab 06/17/14 0400 06/19/14 0407 06/20/14 0400  HGB 15.8 18.7* 19.1*  HCT 48.9 56.1* 57.0*  WBC 7.1 15.2* 19.4*  PLT  179 237 222    COAGULATION No results found for this basename: INR,  in the last 168 hours  CARDIAC  No results found for this basename: TROPONINI,  in the last 168 hours No results found for this basename: PROBNP,  in the last 168 hours   CHEMISTRY  Recent Labs Lab 06/15/14 0500  06/17/14 0400 06/18/14 0900 06/19/14 0407 06/20/14 0400 06/21/14 0600  NA 143  < > 141 140 144 143 149*  K 4.0  < > 4.3 3.9 3.2* 3.0* 3.2*  CL 97  < > 95* 94* 96 98 102   CO2 34*  < > 38* 37* 34* 34* 36*  GLUCOSE 170*  < > 202* 138* 159* 152* 148*  BUN 26*  < > 34* 26* 28* 28* 30*  CREATININE 0.67  < > 0.65 0.63 0.64 0.67 0.79  CALCIUM 7.7*  < > 8.6 9.0 8.8 8.8 8.6  MG 1.9  --   --   --  2.0 1.9 1.9  PHOS 2.3  --   --   --  2.6 2.8 2.8  < > = values in this interval not displayed. Estimated Creatinine Clearance: 85 ml/min (by C-G formula based on Cr of 0.79).   LIVER No results found for this basename: AST, ALT, ALKPHOS, BILITOT, PROT, ALBUMIN, INR,  in the last 168 hours   INFECTIOUS No results found for this basename: LATICACIDVEN, PROCALCITON,  in the last 168 hours   ENDOCRINE CBG (last 3)   Recent Labs  06/20/14 1653 06/20/14 2003 06/21/14 0748  GLUCAP 204* 192* 143*         IMAGING x48h Dg Swallowing Func-speech Pathology  06/20/2014   Orbie Pyo [place], CCC-SLP     06/20/2014 10:03 AM Objective Swallowing Evaluation: Modified Barium Swallowing Study   Patient Details  Name: Chad Morse MRN: [id number] Date of Birth: 22-Mar-1941  Today's Date: 06/20/2014 Time: 0905-0925 SLP Time Calculation (min): 20 min  Past Medical History:  Past Medical History  Diagnosis Date  . COPD (chronic obstructive pulmonary disease)   . Diabetes mellitus   . Atrial fibrillation   . Peripheral neuropathy   . Gallstones   . Peripheral vascular disease   . IBS (irritable bowel syndrome)   . Peripheral arterial disease   . Tobacco abuse   . Hypertension   . Hyperlipidemia    Past Surgical History:  Past Surgical History  Procedure Laterality Date  . Back surgery     HPI:  73 y/o male with PMH: COPD, Afib, DM, PVD, IBS, HTN admitted  after fall, struck his head with brief unresponsiveness.  Had  seizure in ED and was intubated.  Per MD note pt has had very  poor PO intake and has complained of his vision being blurry.  CT  negative for new abnormality, remote right basal ganglia and left  cerebelluym.  Extubated 9/29 and reintubaed 9/29-10/02. MRI  revealed  chronic changes as described. No acute intracranial  findingsordered. CXR cardiomegaly with bilateral pulmonary  infiltrates and small pleural effusions most consistent with  congestive heart failure.  Pt. continues to exhibit pharyngeal  dysphagia, therefore, MBS recommended.      Assessment / Plan / Recommendation Clinical Impression  Dysphagia Diagnosis: Mild oral phase dysphagia;Moderate  pharyngeal phase dysphagia;Severe pharyngeal phase dysphagia Clinical impression: Pt. exhibited mild oral dysphagia with  decreased mastication and delayed transit with solid texture.   Moderate-severe pharyngeal dysphagia affected by an anatomical  source due to what appears to  be significant boney osteophytes.   Epiglottis is unable to fully cover laryngeal vestibule due to  osteophytes encroaching into pharyngeal space resulting in  penetration onto vocal cords with reflexive cough.  Current  illness with intubation x 2 prevents him from naturally  compensating as he had prior to admission.  Prognosis appears  good as he gains strength and endurance.  Vallecular and pyfirom  sinus residue present with penetration once from residuals.   Therapeutic intervention attempted with chin tuck without success  with textures thinner than puree.  Dys 1, pudding thick liquids  recommended with double swallows and intentional coughs/throat  clears.     Treatment Recommendation  Therapy as outlined in treatment plan below    Diet Recommendation Dysphagia 1 (Puree);Pudding-thick liquid  (nothing thinner than puree)   Medication Administration: Crushed with puree Supervision: Patient able to self feed;Full supervision/cueing  for compensatory strategies Compensations: Slow rate;Small sips/bites;Clear throat  intermittently;Clear throat after each swallow;Multiple dry  swallows after each bite/sip Postural Changes and/or Swallow Maneuvers: Seated upright 90  degrees;Upright 30-60 min after meal    Other  Recommendations Oral Care  Recommendations: Oral care BID Other Recommendations: Order thickener from pharmacy   Follow Up Recommendations   (TBD)    Frequency and Duration min 2x/week  2 weeks   Pertinent Vitals/Pain No pain          Reason for Referral Objectively evaluate swallowing function   Oral Phase Oral Preparation/Oral Phase Oral Phase: Impaired Oral - Solids Oral - Regular: Delayed oral transit;Reduced posterior propulsion   Pharyngeal Phase Pharyngeal Phase Pharyngeal Phase: Impaired Pharyngeal - Honey Pharyngeal - Honey Teaspoon: Penetration/Aspiration during  swallow;Delayed swallow initiation;Premature spillage to  valleculae;Reduced epiglottic inversion;Pharyngeal residue -  valleculae;Pharyngeal residue - pyriform sinuses;Reduced tongue  base retraction;Reduced laryngeal elevation Penetration/Aspiration details (honey teaspoon): Material enters  airway, CONTACTS cords and not ejected out (reflexive throat  clear) Pharyngeal - Honey Cup: Penetration/Aspiration during  swallow;Pharyngeal residue - valleculae;Pharyngeal residue -  pyriform sinuses;Reduced laryngeal elevation;Reduced epiglottic  inversion;Reduced airway/laryngeal closure (with chin tuck) Penetration/Aspiration details (honey cup): Material enters  airway, remains ABOVE vocal cords and not ejected out Pharyngeal - Nectar Pharyngeal - Nectar Teaspoon: Penetration/Aspiration during  swallow;Reduced epiglottic inversion;Pharyngeal residue -  valleculae;Pharyngeal residue - pyriform sinuses;Reduced tongue  base retraction;Reduced laryngeal elevation (chin tuck  ineffective) Penetration/Aspiration details (nectar teaspoon): Material enters  airway, CONTACTS cords and not ejected out Pharyngeal - Nectar Cup: Not tested Pharyngeal - Thin Pharyngeal - Thin Teaspoon: Penetration/Aspiration during  swallow;Pharyngeal residue - valleculae;Pharyngeal residue -  pyriform sinuses;Reduced tongue base retraction;Reduced laryngeal  elevation;Reduced epiglottic inversion  Penetration/Aspiration details (thin teaspoon): Material enters  airway, CONTACTS cords and not ejected out (reflexive cough) Pharyngeal - Solids Pharyngeal - Puree: Delayed swallow initiation;Premature spillage  to valleculae Pharyngeal - Regular: Pharyngeal residue - valleculae;Pharyngeal  residue - pyriform sinuses;Reduced tongue base retraction;Reduced  laryngeal elevation  Cervical Esophageal Phase    GO    Cervical Esophageal Phase Cervical Esophageal Phase: [name]         [name] 06/20/2014, 10:01 AM  [name].Ed Engineer, agricultural [phone]         ASSESSMENT / PLAN:  PULMONARY A: 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- seen by Dr Joya Gaskins > 3 years ago  Acute respiratory failure due to seizure 9/25, Failed extubation due to stridor 9/29 and H Flu -Short course of steroids and then extubated 06/17/14   - maintaining airway 06/21/14  P:   Supp O2 to  maintain SpO2 92-97% Cont scheduled and PRN BDs IS  Upright Neg balance noted, tolerated Needs OPD pulmonary followup - Dr Joya Gaskins Future Appointments Date Time Provider [place]  07/18/2014 10:15 AM Elsie Stain, MD [facility] None  08/19/2014 1:45 PM Lorretta Harp, MD [facility] None  10/06/2014 10:30 AM Antony Contras, MD [facility] None      CARDIOVASCULAR A:  [place] Chronic AF, rate controlled Hypertension, episodically severe Hyperlipidemia Peripheral vascular disease Probable chronic PAH  Current  - intermittent A Fib RVR ongoing as of 06/21/14  P:  Continue ASA Cont Eliquis home med Holding home PO meds until swallow OK Cardizem 60 q6h PRN metoprolol 10/02 to maintain HR < 115/min PRN hydralazine to maintain SBP < 170 mmHg Tele   RENAL A:   At admission:  - Hypomag and Hypocalcemia, ? Cause of seizures  Current    06/21/14: mild hypo-K, hypomag and hypernatremia  P:   Replete k and mag Change normal saline to d5water Monitor BMET intermittently Indicated K IB supp Hope to  use oral supp in future Continue lasix  GASTROINTESTINAL A:   Diverticulosis by CT abd/pelvis Gallbladder sludge by CT abd/plevis Recent nausea/vomiting/abdominal pain, resolved Constipation -no BM since admit   P:   SUP: N/I post extubation Swallow eval -then advance Meds, apple sauce , puree likley Dulcolax prn, enema prn  HEMATOLOGIC A:   Polycythemia, resolved Mild thrombocytopenia, resolved P:  DVT px: arixaban (for AF), dc lovenox Monitor CBC intermittently Transfuse per usual ICU guidelines for hgb < 7gm%  INFECTIOUS A:   H flu pnaPNA 06/14/14 - HCAP v VAP in setting of ultiple ABx allergies P:   Micro and abx as above Resume azactam, and with H flu noted, give 7 days total, - ends 06/22/14   ENDOCRINE A:  DM2 P:   Change SSI to ACHS as diet ordered Holding metformin  NEUROLOGIC  A:  Bseline  - chronic pain syndrome : on norco, baclofen, allopurinol, zanaflex, trazadone, ambien Admmitted with   - acute syncope and Concern for seizure - ? Due to low calcium  - Gaze preference -resolved  Current   06/21/14: denies pain. Still with some confusion  P:   Eval and mgmt per Neuro DC fentanyl (no pain currently) Holding baclofen, home norco, allopurinol, zanaflex, trazadone and ambien home meds PT consult   GLOBAL 06/21/14: PT recommending inpatient rehab   Family Updates: Wife updated @ bedside- updated  06/21/14   TODAY'S SUMMARY:   Deconditioned, needs rehab. Give to [facility] - d/w Ebony Hail of [facility] hospitalist. PCCM will sign off 06/22/14 and [facility] primary    Dr. Brand Males, M.D., [facility]C.P Pulmonary and Critical Care Medicine Staff Physician [facility] Pager: [phone], If no answer or between  15:00h - 7:00h: call [phone]  06/21/2014 9:47 AM

## 2014-06-22 ENCOUNTER — Inpatient Hospital Stay (HOSPITAL_COMMUNITY): Payer: Medicare Other

## 2014-06-22 ENCOUNTER — Inpatient Hospital Stay (HOSPITAL_COMMUNITY): Payer: Medicare Other | Admitting: Occupational Therapy

## 2014-06-22 ENCOUNTER — Inpatient Hospital Stay (HOSPITAL_COMMUNITY): Payer: Medicare Other | Admitting: Speech Pathology

## 2014-06-22 DIAGNOSIS — J42 Unspecified chronic bronchitis: Secondary | ICD-10-CM

## 2014-06-22 DIAGNOSIS — G40301 Generalized idiopathic epilepsy and epileptic syndromes, not intractable, with status epilepticus: Secondary | ICD-10-CM

## 2014-06-22 LAB — COMPREHENSIVE METABOLIC PANEL
ALK PHOS: 76 U/L (ref 39–117)
ALT: 56 U/L — AB (ref 0–53)
AST: 34 U/L (ref 0–37)
Albumin: 2.3 g/dL — ABNORMAL LOW (ref 3.5–5.2)
Anion gap: 9 (ref 5–15)
BUN: 26 mg/dL — ABNORMAL HIGH (ref 6–23)
CO2: 34 mEq/L — ABNORMAL HIGH (ref 19–32)
Calcium: 8.2 mg/dL — ABNORMAL LOW (ref 8.4–10.5)
Chloride: 101 mEq/L (ref 96–112)
Creatinine, Ser: 0.68 mg/dL (ref 0.50–1.35)
GFR calc non Af Amer: >90 mL/min (ref >90)
GLUCOSE: 147 mg/dL — AB (ref 70–99)
POTASSIUM: 2.9 meq/L — AB (ref 3.7–5.3)
Sodium: 144 mEq/L (ref 137–147)
Total Bilirubin: 1.2 mg/dL (ref 0.3–1.2)
Total Protein: 5.4 g/dL — ABNORMAL LOW (ref 6.0–8.3)

## 2014-06-22 LAB — CBC WITH DIFFERENTIAL/PLATELET
Basophils Absolute: 0.1 10*3/uL (ref 0.0–0.1)
Basophils Relative: 0 % (ref 0–1)
Eosinophils Absolute: 0.1 10*3/uL (ref 0.0–0.7)
Eosinophils Relative: 0 % (ref 0–5)
HCT: 55.1 % — ABNORMAL HIGH (ref 39.0–52.0)
Hemoglobin: 18.1 g/dL — ABNORMAL HIGH (ref 13.0–17.0)
LYMPHS ABS: 1.7 10*3/uL (ref 0.7–4.0)
LYMPHS PCT: 7 % — AB (ref 12–46)
MCH: 29.9 pg (ref 26.0–34.0)
MCHC: 32.8 g/dL (ref 30.0–36.0)
MCV: 90.9 fL (ref 78.0–100.0)
Monocytes Absolute: 1.9 10*3/uL — ABNORMAL HIGH (ref 0.1–1.0)
Monocytes Relative: 8 % (ref 3–12)
Neutro Abs: 20.7 10*3/uL — ABNORMAL HIGH (ref 1.7–7.7)
Neutrophils Relative %: 85 % — ABNORMAL HIGH (ref 43–77)
Platelets: 171 10*3/uL (ref 150–400)
RBC: 6.06 MIL/uL — AB (ref 4.22–5.81)
RDW: 15.6 % — ABNORMAL HIGH (ref 11.5–15.5)
WBC: 24.5 10*3/uL — ABNORMAL HIGH (ref 4.0–10.5)

## 2014-06-22 LAB — GLUCOSE, CAPILLARY
Glucose-Capillary: 129 mg/dL — ABNORMAL HIGH (ref 70–99)
Glucose-Capillary: 151 mg/dL — ABNORMAL HIGH (ref 70–99)
Glucose-Capillary: 152 mg/dL — ABNORMAL HIGH (ref 70–99)
Glucose-Capillary: 166 mg/dL — ABNORMAL HIGH (ref 70–99)
Glucose-Capillary: 217 mg/dL — ABNORMAL HIGH (ref 70–99)
Glucose-Capillary: 228 mg/dL — ABNORMAL HIGH (ref 70–99)

## 2014-06-22 MED ORDER — TRAZODONE HCL 50 MG PO TABS
50.0000 mg | ORAL_TABLET | Freq: Every evening | ORAL | Status: DC | PRN
  Administered 2014-06-24 – 2014-06-29 (×6): 50 mg via ORAL
  Filled 2014-06-22 (×8): qty 1

## 2014-06-22 MED ORDER — ENSURE PUDDING PO PUDG
1.0000 | Freq: Every day | ORAL | Status: DC
  Administered 2014-06-22 – 2014-06-30 (×9): 1 via ORAL

## 2014-06-22 MED ORDER — POTASSIUM CHLORIDE CRYS ER 20 MEQ PO TBCR
20.0000 meq | EXTENDED_RELEASE_TABLET | Freq: Two times a day (BID) | ORAL | Status: DC
  Administered 2014-06-22 (×2): 20 meq via ORAL
  Filled 2014-06-22 (×5): qty 1

## 2014-06-22 NOTE — Evaluation (Signed)
Occupational Therapy Assessment and Plan  Patient Details  Name: Chad Morse MRN: [id number] Date of Birth: [birth year]  OT Diagnosis: muscle weakness (generalized) Rehab Potential: Rehab Potential: Good ELOS: 14-16 days   Today's Date: 06/22/2014 OT Individual Time: 8416-6063 OT Individual Time Calculation (min): 60 min     Problem List:  Patient Active Problem List   Diagnosis Date Noted  . Debilitated 06/21/2014  . Debility 06/20/2014  . Protein-calorie malnutrition, severe 06/11/2014  . Status epilepticus 06/10/2014  . Chronic anticoagulation 03/13/2014  . Bilateral leg edema 03/13/2014  . Restless legs 03/07/2014  . Paraplegia 09/06/2013  . Pain in limb 09/06/2013  . 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 Coronary artery disease 08/10/2013  . Essential hypertension 08/10/2013  . Hyperlipidemia 08/10/2013  . Gastrointestinal bleeding, lower 11/10/2012  . Acute blood loss anemia 11/10/2012  . Partial small bowel obstruction 11/07/2012  . Hypokalemia 11/07/2012  . Nausea and vomiting 11/05/2012  . Dehydration 11/05/2012  . Alternating constipation and diarrhea 08/25/2012  . Bronchitis, acute, with chronic airway obstruction 01/27/2012  . Chest pain 01/25/2012  . COPD (chronic obstructive pulmonary disease)   . Peripheral vascular disease   . Permanent atrial fibrillation   . Peripheral neuropathy   . Gallstones   . PULMONARY NODULE, RIGHT LOWER LOBE 08/11/2009  . DIABETES, TYPE 2 08/09/2009  . CAD 08/09/2009  . Campath-induced atrial fibrillation 08/09/2009  . PERIPHERAL VASCULAR DISEASE 08/09/2009  . C O P D 08/09/2009  . RENAL INSUFFICIENCY 08/09/2009    Past Medical History:  Past Medical History  Diagnosis Date  . COPD (chronic obstructive pulmonary disease)   . Diabetes mellitus   . Atrial fibrillation   . Peripheral neuropathy   . Gallstones   . Peripheral vascular disease   . IBS  (irritable bowel syndrome)   . Peripheral arterial disease   . Tobacco abuse   . Hypertension   . Hyperlipidemia    Past Surgical History:  Past Surgical History  Procedure Laterality Date  . Back surgery      Assessment & Plan Clinical Impression: Patient is a 73 y.o. right-handed male with history of COPD, atrial fibrillation on Eliquis and seizure disorder. Patient lives with his wife uses a rolling walker prior to admission. Admitted 06/11/2014 after a fall at home when standing from a sitting position and struck his head. There was reports of brief unresponsiveness. He was initially conversant in the EEG and had a seizure requiring intubation for airway protection. Cranial CT scan negative for acute abnormalities and followup MRI negative for acute changes. CT angiogram chest negative. Patient initially loaded with Keppra later discontinued secondary to agitation felt this was possibly contributing to his restlessness. No further seizure activity noted. Suspect hypomagnesemia as well as hypocalcemia possibly related to his seizures. EEG with moderate diffuse swelling no seizure activity noted generalized cerebral dysfunction suspect encephalopathy. Patient's Eliquis to be resumed for history of atrial fibrillation. All cardiac markers remained negative. Patient was extubated 06/17/2014. Physical therapy evaluation completed 06/19/2014 with recommendations of physical medicine rehabilitation consult.   Patient transferred to CIR on 06/21/2014 .    Patient currently requires total with basic self-care skills secondary to muscle weakness, decreased cardiorespiratoy endurance and decreased oxygen support and decreased standing balance and decreased postural control.  Prior to hospitalization, patient could complete ADLs with min.  Patient will benefit from skilled intervention to decrease level of assist with basic self-care skills and increase  independence with basic self-care skills prior to  discharge home with care partner.  Anticipate patient will require supervision with mobility and self-care tasks, mod assist LB dressing and follow up home health.  OT - End of Session Activity Tolerance: Tolerates 30+ min activity with multiple rests Endurance Deficit: Yes Endurance Deficit Description: on 4L o2 during activity with sats remaining 93-95% and HR 113-120 with sit > stand and seated bathing tasks OT Assessment Rehab Potential: Good OT Patient demonstrates impairments in the following area(s): Balance;Endurance;Motor;Perception;Safety OT Basic ADL's Functional Problem(s): Grooming;Bathing;Dressing;Toileting OT Transfers Functional Problem(s): Toilet;Tub/Shower OT Additional Impairment(s): None OT Plan OT Intensity: Minimum of 1-2 x/day, 45 to 90 minutes OT Frequency: 5 out of 7 days OT Duration/Estimated Length of Stay: 14-16 days OT Treatment/Interventions: Balance/vestibular training;Cognitive remediation/compensation;Community reintegration;Discharge planning;Disease mangement/prevention;DME/adaptive equipment instruction;Functional mobility training;Patient/family education;Psychosocial support;Self Care/advanced ADL retraining;Skin care/wound managment;Therapeutic Activities;UE/LE Strength taining/ROM;UE/LE Coordination activities OT Basic Self-Care Anticipated Outcome(s): supervision UB, mod assist LB dressing OT Toileting Anticipated Outcome(s): supervision OT Bathroom Transfers Anticipated Outcome(s): supervision OT Recommendation Patient destination: Home Follow Up Recommendations: Home health OT Equipment Recommended: To be determined   Skilled Therapeutic Intervention OT eval initiated, ADL assessment completed at sit > stand level with focus on activity tolerance and cardiorespiratory endurance.  Engaged in bathing and dressing at sit > stand level at sink with mod assist sit > stand and stand pivot transfer.  Pt appears unsure of mobility with stand pivot  transfer.  Pt required multiple rest breaks throughout session due to decreased cardiorespiratory endurance. Frequent monitoring of O2 and HR with O2 remaining 93-95% on 4L and HR in 113-120 with seated bathing tasks.  Pt required total assist with LB dressing this session, also noted to be incontinent of bowel requiring assist to wash buttocks while pt stabilized self in standing at sink.  Educated on purpose of OT and potential goals with pt and pt's wife reporting understanding.  OT Evaluation Precautions/Restrictions  Precautions Precautions: Fall Precaution Comments: monitor O2 and HR Pain Pain Assessment Pain Assessment: No/denies pain Pain Score: 0-No pain Home Living/Prior Functioning Home Living Family/patient expects to be discharged to:: Private residence Living Arrangements: Spouse/significant other Available Help at Discharge: Family;Available 24 hours/day Type of Home: House Home Access: Ramped entrance Home Layout: One level Additional Comments: has hospital bed, Rollator RW   Lives With: Spouse IADL History Homemaking Responsibilities: No Prior Function Level of Independence: Needs assistance with ADLs;Requires assistive device for independence (wife assisted with donning socks and shoes)  Able to Take Stairs?: No Driving: No Vocation: Retired Comments: Civil engineer, 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, etc) ADL  See FIM Vision/Perception  Vision- History Baseline Vision/History: No visual deficits Patient Visual Report:  (Reports some blurring of vision for about a month, never worn glasses) Vision- Assessment Vision Assessment?: No apparent visual deficits  Cognition Overall Cognitive Status: Within Functional Limits for tasks assessed Arousal/Alertness: Awake/alert Orientation Level: Oriented to person;Oriented to place Awareness: Impaired Sensation Sensation Light Touch: Appears Intact Proprioception: Appears Intact Coordination Gross Motor Movements are  Fluid and Coordinated: No Fine Motor Movements are Fluid and Coordinated: No Coordination and Movement Description: Mild tremor, slow movement Finger Nose Finger Test: some dysmetria, ? of visual impairment Extremity/Trunk Assessment RUE Assessment RUE Assessment: Exceptions to [facility] (strength grossly 4/5) LUE Assessment LUE Assessment: Exceptions to [facility] (strength grossly 4/5 overall, 3+/5 in shoulder)  FIM:  FIM - Eating Eating Activity: 5: Needs verbal cues/supervision;5: Set-up assist for open containers FIM - Grooming Grooming Steps: Wash, rinse,  dry face;Wash, rinse, dry hands;Oral care, brush teeth, clean dentures Grooming: 4: Patient completes 3 of 4 or 4 of 5 steps FIM - Bathing Bathing Steps Patient Completed: Chest;Right Arm;Left Arm;Abdomen;Right upper leg;Left upper leg Bathing: 3: Mod-Patient completes 5-7 [id number] parts or 50-74% FIM - Upper Body Dressing/Undressing Upper body dressing/undressing steps patient completed: Thread/unthread right sleeve of pullover shirt/dresss;Thread/unthread left sleeve of pullover shirt/dress;Put head through opening of pull over shirt/dress Upper body dressing/undressing: 4: Min-Patient completed 75 plus % of tasks FIM - Lower Body Dressing/Undressing Lower body dressing/undressing: 1: Total-Patient completed less than 25% of tasks FIM - BEngineer, siteAssistive Devices: HOB elevated;Bed rails Bed/Chair Transfer: 3: Supine > Sit: Mod A (lifting assist/Pt. 50-74%/lift 2 legs;3: Sit > Supine: Mod A (lifting assist/Pt. 50-74%/lift 2 legs);3: Bed > Chair or W/C: Mod A (lift or lower assist);3: Chair or W/C > Bed: Mod A (lift or lower assist) FIM - TAir cabin crewTransfers: 0-Activity did not occur FIM - Tub/Shower Transfers Tub/shower Transfers: 0-Activity did not occur or was simulated   Refer to Care Plan for Long Term Goals  Recommendations for other services: None  Discharge Criteria: Patient will be  discharged from OT if patient refuses treatment 3 consecutive times without medical reason, if treatment goals not met, if there is a change in medical status, if patient makes no progress towards goals or if patient is discharged from hospital.  The above assessment, treatment plan, treatment alternatives and goals were discussed and mutually agreed upon: by patient and by family  Garald Rhew, [facility]10/03/2014, 2:25 PM

## 2014-06-22 NOTE — Progress Notes (Signed)
Patient information reviewed and entered into eRehab system by Lorrena Goranson, RN, CRRN, PPS Coordinator.  Information including medical coding and functional independence measure will be reviewed and updated through discharge.     Per nursing patient was given "Data Collection Information Summary for Patients in Inpatient Rehabilitation Facilities with attached "Privacy Act Statement-Health Care Records" upon admission.  

## 2014-06-22 NOTE — Evaluation (Signed)
Speech Language Pathology Assessment and Plan  Patient Details  Name: Chad Morse MRN: [id number] Date of Birth: 1941-03-27  SLP Diagnosis: Dysphagia;Cognitive Impairments  Rehab Potential: Good ELOS: 14-16 days    Today's Date: 06/22/2014 SLP Individual Time: 0800-0900 SLP Individual Time Calculation (min): 60 min   Problem List:  Patient Active Problem List   Diagnosis Date Noted  . Debilitated 06/21/2014  . Debility 06/20/2014  . Protein-calorie malnutrition, severe 06/11/2014  . Status epilepticus 06/10/2014  . Chronic anticoagulation 03/13/2014  . Bilateral leg edema 03/13/2014  . Restless legs 03/07/2014  . Paraplegia 09/06/2013  . Pain in limb 09/06/2013  . 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 Coronary artery disease 08/10/2013  . Essential hypertension 08/10/2013  . Hyperlipidemia 08/10/2013  . Gastrointestinal bleeding, lower 11/10/2012  . Acute blood loss anemia 11/10/2012  . Partial small bowel obstruction 11/07/2012  . Hypokalemia 11/07/2012  . Nausea and vomiting 11/05/2012  . Dehydration 11/05/2012  . Alternating constipation and diarrhea 08/25/2012  . Bronchitis, acute, with chronic airway obstruction 01/27/2012  . Chest pain 01/25/2012  . COPD (chronic obstructive pulmonary disease)   . Peripheral vascular disease   . Permanent atrial fibrillation   . Peripheral neuropathy   . Gallstones   . PULMONARY NODULE, RIGHT LOWER LOBE 08/11/2009  . DIABETES, TYPE 2 08/09/2009  . CAD 08/09/2009  . Campath-induced atrial fibrillation 08/09/2009  . PERIPHERAL VASCULAR DISEASE 08/09/2009  . C O P D 08/09/2009  . RENAL INSUFFICIENCY 08/09/2009   Past Medical History:  Past Medical History  Diagnosis Date  . COPD (chronic obstructive pulmonary disease)   . Diabetes mellitus   . Atrial fibrillation   . Peripheral neuropathy   . Gallstones   . Peripheral vascular disease   . IBS (irritable  bowel syndrome)   . Peripheral arterial disease   . Tobacco abuse   . Hypertension   . Hyperlipidemia    Past Surgical History:  Past Surgical History  Procedure Laterality Date  . Back surgery      Assessment / Plan / Recommendation Clinical Impression Chad Morse is a 73 y.o. right-handed male with history of COPD, atrial fibrillation on Eliquis and remote seizure disorder. Patient on no anti-seizure medication prior to admission. Patient lives with his wife uses a rolling walker prior to admission. Admitted 06/11/2014 after a fall at home when standing from a sitting position and struck his head. There was reports of brief unresponsiveness. He was initially conversant in the ED and had a seizure requiring intubation for airway protection. Noted calcium level 6.1 and magnesium level 0.6 on admission. Cranial CT scan negative for acute abnormalities and followup MRI negative for acute changes. CT angiogram chest negative. Patient initially loaded with Keppra later discontinued secondary to agitation felt this was possibly contributing to his restlessness. No further seizure activity noted and currently on no anti-seizure medication. Suspect hypomagnesemia as well as hypocalcemia possibly related to his seizures. EEG with moderate diffuse swelling no seizure activity noted generalized cerebral dysfunction suspect encephalopathy. Patient's Eliquis to be resumed for history of atrial fibrillation. All cardiac markers remained negative. Patient was extubated 06/17/2014. Currently maintained on a dysphagia 1 pudding thick liquid diet. Physical and occupational therapy evaluations completed 06/19/2014 with recommendations of physical medicine rehabilitation consult. Patient was admitted to CIR for cognitive rehabilitation program on 06/21/14 and administered a cognitive-linguistic evaluation which demonstrates moderate cognitive impairments impacting orientation, attention, problem solving, memory  and  awareness which impacts his ability to complete functional tasks safely at this time. Patient was also administered a BSE and demonstrated overt s/s of aspiration with current diet of Dys. 1 textures with pudding-thick liquids, suspect due to a suspected delayed swallow initiation and large bites. Patient required Max A multimodal cues for utilization of swallowing compensatory strategies throughout the meal. Further trials were not attempted at this time due to results of MBS completed on 06/20/14. Patient would benefit from skilled SLP intervention to maximize his cognitive-linguistic and swallowing function in order to maximize his overall functional independence prior to discharge. Anticipate patient will require 24 hour supervision at home and f/u SLP services.   Skilled Therapeutic Interventions          Patient administered a cognitive-linguistic and bedside swallow evaluation. Please see above for details. Patient and wife, Jana Half, educated in regards to current cognitive-linguistic and swallowing function and goals of skilled SLP intervention. Patient verbalized understanding but will require reinforcement.    SLP Assessment  Patient will need skilled Speech Lanaguage Pathology Services during CIR admission    Recommendations  Diet Recommendations: Dysphagia 1 (Puree);Pudding-thick liquid Liquid Administration via: Spoon Medication Administration: Crushed with puree Supervision: Patient able to self feed;Full supervision/cueing for compensatory strategies Compensations: Slow rate;Small sips/bites;Clear throat after each swallow;Multiple dry swallows after each bite/sip Postural Changes and/or Swallow Maneuvers: Seated upright 90 degrees;Upright 30-60 min after meal Oral Care Recommendations: Oral care BID Recommendations for Other Services: Neuropsych consult Patient destination: Home Follow up Recommendations: 24 hour supervision/assistance;Home Health SLP Equipment Recommended: To be  determined    SLP Frequency 5 out of 7 days   SLP Treatment/Interventions Cognitive remediation/compensation;Cueing hierarchy;Dysphagia/aspiration precaution training;Internal/external aids;Patient/family education;Functional tasks;Therapeutic Activities;Environmental controls    Pain Pain Assessment Pain Assessment: No/denies pain Pain Score: 0-No pain Prior Functioning Type of Home: House  Lives With: Spouse Available Help at Discharge: Family;Available 24 hours/day Vocation: Retired  Industrial/product designer Term Goals: Week 1: SLP Short Term Goal 1 (Week 1): Patient will utilize external visual aids to recall new, daily information with Max A multimodal cues.   SLP Short Term Goal 2 (Week 1): Patient will demonstrate functional problem solving for basic and familiar tasks with Mod A multimodal cues. SLP Short Term Goal 3 (Week 1): Patient will sustain attention to basic and familiar tasks for 5 minutes with Min A multimodal.  SLP Short Term Goal 4 (Week 1): Patient will identify 1 cognitive and 1 physical deficit with Max A multimodal cues.  SLP Short Term Goal 5 (Week 1): Patient will utilize swallowing compensatory strategies with least restrictive diet to minimize overt s/s of aspiration with Mod  A multimodal cues.  SLP Short Term Goal 6 (Week 1): Patient will orient to time, place, person and situation with Mod A multimodal cues.   See FIM for current functional status Refer to Care Plan for Long Term Goals  Recommendations for other services: Neuropsych  Discharge Criteria: Patient will be discharged from SLP if patient refuses treatment 3 consecutive times without medical reason, if treatment goals not met, if there is a change in medical status, if patient makes no progress towards goals or if patient is discharged from hospital.  The above assessment, treatment plan, treatment alternatives and goals were discussed and mutually agreed upon: by patient and by family  Kadence Mikkelson,  Leatta Alewine 06/22/2014, 3:39 PM

## 2014-06-22 NOTE — Progress Notes (Signed)
[facility] PHYSICAL MEDICINE & REHABILITATION     PROGRESS NOTE    Subjective/Complaints: No problems over night. Slept well. Ready to get started with therapies today. A  review of systems has been performed and if not noted above is otherwise negative.   Objective: Vital Signs: Blood pressure 138/77, pulse 81, temperature 98.1 F (36.7 C), temperature source Oral, resp. rate 20, weight 87.1 kg (192 lb 0.3 oz), SpO2 93.00%. Dg Swallowing Func-speech Pathology  06/20/2014   Chad Morse [place], CCC-SLP     06/20/2014 10:03 AM Objective Swallowing Evaluation: Modified Barium Swallowing Study   Patient Details  Name: Chad Morse MRN: [id number] Date of Birth: 08-Dec-1940  Today's Date: 06/20/2014 Time: 0905-0925 SLP Time Calculation (min): 20 min  Past Medical History:  Past Medical History  Diagnosis Date  . COPD (chronic obstructive pulmonary disease)   . Diabetes mellitus   . Atrial fibrillation   . Peripheral neuropathy   . Gallstones   . Peripheral vascular disease   . IBS (irritable bowel syndrome)   . Peripheral arterial disease   . Tobacco abuse   . Hypertension   . Hyperlipidemia    Past Surgical History:  Past Surgical History  Procedure Laterality Date  . Back surgery     HPI:  73 y/o male with PMH: COPD, Afib, DM, PVD, IBS, HTN admitted  after fall, struck his head with brief unresponsiveness.  Had  seizure in ED and was intubated.  Per MD note pt has had very  poor PO intake and has complained of his vision being blurry.  CT  negative for new abnormality, remote right basal ganglia and left  cerebelluym.  Extubated 9/29 and reintubaed 9/29-10/02. MRI  revealed chronic changes as described. No acute intracranial  findingsordered. CXR cardiomegaly with bilateral pulmonary  infiltrates and small pleural effusions most consistent with  congestive heart failure.  Pt. continues to exhibit pharyngeal  dysphagia, therefore, MBS recommended.      Assessment / Plan / Recommendation Clinical  Impression  Dysphagia Diagnosis: Mild oral phase dysphagia;Moderate  pharyngeal phase dysphagia;Severe pharyngeal phase dysphagia Clinical impression: Pt. exhibited mild oral dysphagia with  decreased mastication and delayed transit with solid texture.   Moderate-severe pharyngeal dysphagia affected by an anatomical  source due to what appears to be significant boney osteophytes.   Epiglottis is unable to fully cover laryngeal vestibule due to  osteophytes encroaching into pharyngeal space resulting in  penetration onto vocal cords with reflexive cough.  Current  illness with intubation x 2 prevents him from naturally  compensating as he had prior to admission.  Prognosis appears  good as he gains strength and endurance.  Vallecular and pyfirom  sinus residue present with penetration once from residuals.   Therapeutic intervention attempted with chin tuck without success  with textures thinner than puree.  Dys 1, pudding thick liquids  recommended with double swallows and intentional coughs/throat  clears.     Treatment Recommendation  Therapy as outlined in treatment plan below    Diet Recommendation Dysphagia 1 (Puree);Pudding-thick liquid  (nothing thinner than puree)   Medication Administration: Crushed with puree Supervision: Patient able to self feed;Full supervision/cueing  for compensatory strategies Compensations: Slow rate;Small sips/bites;Clear throat  intermittently;Clear throat after each swallow;Multiple dry  swallows after each bite/sip Postural Changes and/or Swallow Maneuvers: Seated upright 90  degrees;Upright 30-60 min after meal    Other  Recommendations Oral Care Recommendations: Oral care BID Other Recommendations: Order thickener from pharmacy   Follow Up  Recommendations   (TBD)    Frequency and Duration min 2x/week  2 weeks   Pertinent Vitals/Pain No pain          Reason for Referral Objectively evaluate swallowing function   Oral Phase Oral Preparation/Oral Phase Oral Phase: Impaired Oral -  Solids Oral - Regular: Delayed oral transit;Reduced posterior propulsion   Pharyngeal Phase Pharyngeal Phase Pharyngeal Phase: Impaired Pharyngeal - Honey Pharyngeal - Honey Teaspoon: Penetration/Aspiration during  swallow;Delayed swallow initiation;Premature spillage to  valleculae;Reduced epiglottic inversion;Pharyngeal residue -  valleculae;Pharyngeal residue - pyriform sinuses;Reduced tongue  base retraction;Reduced laryngeal elevation Penetration/Aspiration details (honey teaspoon): Material enters  airway, CONTACTS cords and not ejected out (reflexive throat  clear) Pharyngeal - Honey Cup: Penetration/Aspiration during  swallow;Pharyngeal residue - valleculae;Pharyngeal residue -  pyriform sinuses;Reduced laryngeal elevation;Reduced epiglottic  inversion;Reduced airway/laryngeal closure (with chin tuck) Penetration/Aspiration details (honey cup): Material enters  airway, remains ABOVE vocal cords and not ejected out Pharyngeal - Nectar Pharyngeal - Nectar Teaspoon: Penetration/Aspiration during  swallow;Reduced epiglottic inversion;Pharyngeal residue -  valleculae;Pharyngeal residue - pyriform sinuses;Reduced tongue  base retraction;Reduced laryngeal elevation (chin tuck  ineffective) Penetration/Aspiration details (nectar teaspoon): Material enters  airway, CONTACTS cords and not ejected out Pharyngeal - Nectar Cup: Not tested Pharyngeal - Thin Pharyngeal - Thin Teaspoon: Penetration/Aspiration during  swallow;Pharyngeal residue - valleculae;Pharyngeal residue -  pyriform sinuses;Reduced tongue base retraction;Reduced laryngeal  elevation;Reduced epiglottic inversion Penetration/Aspiration details (thin teaspoon): Material enters  airway, CONTACTS cords and not ejected out (reflexive cough) Pharyngeal - Solids Pharyngeal - Puree: Delayed swallow initiation;Premature spillage  to valleculae Pharyngeal - Regular: Pharyngeal residue - valleculae;Pharyngeal  residue - pyriform sinuses;Reduced tongue base  retraction;Reduced  laryngeal elevation  Cervical Esophageal Phase    GO    Cervical Esophageal Phase Cervical Esophageal Phase: Chad Morse         [name] 06/20/2014, 10:01 AM  Chad Morse Colvin Caroli.Ed CCC-SLP Pager [phone]      Recent Labs  06/20/14 0400 06/22/14 0520  WBC 19.4* 24.5*  HGB 19.1* 18.1*  HCT 57.0* 55.1*  PLT 222 171    Recent Labs  06/21/14 0600 06/22/14 0520  NA 149* 144  K 3.2* 2.9*  CL 102 101  GLUCOSE 148* 147*  BUN 30* 26*  CREATININE 0.79 0.68  CALCIUM 8.6 8.2*   CBG (last 3)   Recent Labs  06/21/14 2116 06/22/14 0014 06/22/14 0425  GLUCAP 170* 151* 152*    Wt Readings from Last 3 Encounters:  06/22/14 87.1 kg (192 lb 0.3 oz)  06/21/14 87.952 kg (193 lb 14.4 oz)  05/24/14 92.987 kg (205 lb)    Physical Exam:  Constitutional: He appears well-developed.  HENT: oral mucosa moist  Head: Normocephalic.  Eyes: EOM are normal.  Neck: Normal range of motion. Neck supple. No thyromegaly present.  Cardiovascular:  Cardiac rate controlled. No murmur or rubs. Reg rhythm.  Respiratory: Effort normal and breath sounds normal. No respiratory distress. No wheezes  GI: Soft. Bowel sounds are normal. He exhibits no distension.  Neurological: He is alert and generally appropriate. Fair insight and awareness. .   motor strength is 4/5 bilateral deltoid, bicep, tricep, grip  3 minus bilaterally hip flexors, 4 minus knee extensors, 4 ankle dorsiflexor plantar flexor  Sensation reduced to light touch bilateral lower extremities below the knees. DTR's 1+  Musculoskeletal: trace LE edema.  Skin: numerous small abrasions on legs. Small area of breakdown on the left lateral malleolus  Psych: generally pleasant and cooperative.  Assessment/Plan: 1. Functional deficits secondary to debilitation related to status epilepticus and subsequent respiratory failure which require 3+ hours per day of interdisciplinary therapy in a comprehensive inpatient rehab  setting. Physiatrist is providing close team supervision and 24 hour management of active medical problems listed below. Physiatrist and rehab team continue to assess barriers to discharge/monitor patient progress toward functional and medical goals. FIM:                   Comprehension Comprehension Mode: Auditory Comprehension: 3-Understands basic 50 - 74% of the time/requires cueing 25 - 50%  of the time  Expression Expression Mode: Verbal Expression: 5-Expresses basic needs/ideas: With no assist  Social Interaction Social Interaction: 5-Interacts appropriately 90% of the time - Needs monitoring or encouragement for participation or interaction.  Problem Solving Problem Solving: 4-Solves basic 75 - 89% of the time/requires cueing 10 - 24% of the time  Memory Memory: 3-Recognizes or recalls 50 - 74% of the time/requires cueing 25 - 49% of the time  Medical Problem List and Plan:  1. 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 DVT Prophylaxis/Anticoagulation: Eliquis.  3. Pain Management: Tylenol as needed  4. Dysphagia. Dysphagia 1 pudding thick liquids. HS IVF  5. Neuropsych: This patient is not capable of making decisions on his own behalf.  6. Skin/Wound Care: Routine skin checks  7. Fluids/Electrolytes/Nutrition: Monitor electrolytes closely. Monitor magnesium and calcium levels Strict I and O.'s. HS IV fluids for hydration  8. Atrial fibrillation. Continue Cardizem 60 mg 4 times a day, Lopressor 12.5 mg twice a day. Cardiac rate generally controlled (some variation yesterday) 9. Diabetes mellitus.  . Sliding scale insulin. Patient on Glucophage 500 mg twice a day prior to admission. Resume 250mg  bid starting today 10. Tobacco abuse /COPD. DuoNeb every 6 hours, Albuterol nebulizer as needed. Check oxygen saturations every shift. Patient on Spiriva 18 mcg daily prior to admission. Provide counseling  for tobacco abuse  11. Hyperlipidemia. Lipitor   LOS (Days) 1 A FACE TO FACE EVALUATION WAS PERFORMED  SWARTZ,ZACHARY T 06/22/2014 7:38 AM

## 2014-06-22 NOTE — Evaluation (Signed)
Physical Therapy Assessment and Plan  Patient Details  Name: Chad Morse MRN: [id number] Date of Birth: 1941/04/13  PT Diagnosis: Abnormality of gait, Cognitive deficits, Coordination disorder and Difficulty walking, decreased endurance Rehab Potential: Fair ELOS: 14-16 days   Today's Date: 06/22/2014 PT Individual Time: 0900-1000 PT Individual Time Calculation (min): 60 min    Problem List:  Patient Active Problem List   Diagnosis Date Noted  . Debilitated 06/21/2014  . Debility 06/20/2014  . Protein-calorie malnutrition, severe 06/11/2014  . Status epilepticus 06/10/2014  . Chronic anticoagulation 03/13/2014  . Bilateral leg edema 03/13/2014  . Restless legs 03/07/2014  . Paraplegia 09/06/2013  . Pain in limb 09/06/2013  . 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 Coronary artery disease 08/10/2013  . Essential hypertension 08/10/2013  . Hyperlipidemia 08/10/2013  . Gastrointestinal bleeding, lower 11/10/2012  . Acute blood loss anemia 11/10/2012  . Partial small bowel obstruction 11/07/2012  . Hypokalemia 11/07/2012  . Nausea and vomiting 11/05/2012  . Dehydration 11/05/2012  . Alternating constipation and diarrhea 08/25/2012  . Bronchitis, acute, with chronic airway obstruction 01/27/2012  . Chest pain 01/25/2012  . COPD (chronic obstructive pulmonary disease)   . Peripheral vascular disease   . Permanent atrial fibrillation   . Peripheral neuropathy   . Gallstones   . PULMONARY NODULE, RIGHT LOWER LOBE 08/11/2009  . DIABETES, TYPE 2 08/09/2009  . CAD 08/09/2009  . [place]-induced atrial fibrillation 08/09/2009  . PERIPHERAL VASCULAR DISEASE 08/09/2009  . C O P D 08/09/2009  . RENAL INSUFFICIENCY 08/09/2009    Past Medical History:  Past Medical History  Diagnosis Date  . COPD (chronic obstructive pulmonary disease)   . Diabetes mellitus   . Atrial fibrillation   . Peripheral neuropathy   .  Gallstones   . Peripheral vascular disease   . IBS (irritable bowel syndrome)   . Peripheral arterial disease   . Tobacco abuse   . Hypertension   . Hyperlipidemia    Past Surgical History:  Past Surgical History  Procedure Laterality Date  . Back surgery      Assessment & Plan Clinical Impression: Chad Morse is a 73 y.o. right-handed male with history of COPD, atrial fibrillation on Eliquis and remote seizure disorder. Patient on no anti-seizure medication prior to admission. Patient lives with his wife uses a rolling walker prior to admission. Admitted 06/11/2014 after a fall at home when standing from a sitting position and struck his head. There was reports of brief unresponsiveness. He was initially conversant in the ED and had a seizure requiring intubation for airway protection. Noted calcium level 6.1 and magnesium level 0.6 on admission. Cranial CT scan negative for acute abnormalities and followup MRI negative for acute changes. CT angiogram chest negative. Patient initially loaded with Keppra later discontinued secondary to agitation felt this was possibly contributing to his restlessness. No further seizure activity noted and currently on no anti-seizure medication. Suspect hypomagnesemia as well as hypocalcemia possibly related to his seizures. EEG with moderate diffuse swelling no seizure activity noted generalized cerebral dysfunction suspect encephalopathy. Patient's Eliquis to be resumed for history of atrial fibrillation. All cardiac markers remained negative. Patient was extubated 06/17/2014. Currently maintained on a dysphagia 1 pudding thick liquid diet. Physical and occupational therapy evaluations completed 06/19/2014 with recommendations of physical medicine rehabilitation consult. Patient was admitted for cognitive rehabilitation program.  Patient transferred to CIR on 06/21/2014 .   Patient currently requires mod with  mobility secondary to muscle weakness, decreased  cardiorespiratoy endurance and decreased oxygen support, decreased coordination, decreased memory and decreased standing balance and decreased postural control.  Prior to hospitalization, patient was modified independent  with mobility and lived with Spouse in a House home.  Home access is  Ramped entrance.  Patient will benefit from skilled PT intervention to maximize safe functional mobility, minimize fall risk and decrease caregiver burden for planned discharge home with 24 hour supervision.  Anticipate patient will benefit from follow up [place] at discharge.     Skilled Therapeutic Intervention Session focused on functional evaluation, orientation to rehab unit/role of PT in care team, falls risk, safety in room. Pt placed in 20x18 wheelchair w/ basic cushion, will continue to evaluate most appropriate seating solution. Pt on 4L O2 throughout session, demonstrated heavy reliance on oral inspiration, had difficulty inspiring through nose in order to utilize nasal cannula effectively. Pt moved supine <>sit x2 w/ ModA, oxygen saturation remained stable w/ activity. Stand pivot transfer w/ RW bed>w/c w/ Mod-MaxA for managing RW, positioning feet, hand placement. Pt left seated in w/c w/ wife present w/ all needs within reach.  PT Evaluation Precautions/Restrictions Precautions Precautions: Fall Restrictions Weight Bearing Restrictions: No General Chart Reviewed: Yes Vital SignsOxygen Therapy SpO2: 94 % O2 Device: Nasal cannula O2 Flow Rate (L/min): 4 L/min Pain Pain Assessment Pain Assessment: No/denies pain Pain Score: 0-No pain Home Living/Prior Functioning Home Living Available Help at Discharge: Family;Available 24 hours/day Type of Home: House Home Access: Ramped entrance Home Layout: One level Additional Comments: has hospital bed, Rollator RW   Lives With: Spouse Prior Function Level of Independence: Needs assistance with ADLs (wife provided assist for LB dressing, some assist for  hygiene)  Able to Take Stairs?: No Driving: No Vocation: Retired Tax adviser Overall Cognitive Status: Impaired/Different from baseline Arousal/Alertness: Awake/alert Orientation Level: Oriented to place;Disoriented to time;Disoriented to situation;Disoriented to person (disoriented to age) Attention: Sustained Sustained Attention: Impaired Sustained Attention Impairment: Verbal basic;Functional basic Memory: Impaired Memory Impairment: Decreased short term memory;Decreased recall of new information;Decreased long term memory;Storage deficit;Retrieval deficit Decreased Long Term Memory: Verbal basic;Functional basic Decreased Short Term Memory: Verbal basic;Functional basic Awareness: Impaired Awareness Impairment: Intellectual impairment Problem Solving: Impaired Problem Solving Impairment: Verbal basic;Functional basic Executive Function: Self Correcting;Self Monitoring;Decision Making Decision Making: Impaired Decision Making Impairment: Verbal basic;Functional basic Self Monitoring: Impaired Self Monitoring Impairment: Functional basic Self Correcting: Impaired Self Correcting Impairment: Verbal basic;Functional basic Behaviors: Perseveration Safety/Judgment: Impaired Sensation Sensation Light Touch: Appears Intact Coordination Gross Motor Movements are Fluid and Coordinated: No Coordination and Movement Description: Mild tremor, slow movement Finger Nose Finger Test: some dysmetria, ? of visual impairment Motor     Mobility Bed Mobility Bed Mobility: Supine to Sit;Sit to Supine Supine to Sit: 3: Mod assist Supine to Sit Details (indicate cue type and reason): management of BLE, definite use of rails/HOB functions Sit to Supine: 3: Mod assist Sit to Supine - Details (indicate cue type and reason): management of BLE Transfers Transfers: Yes Locomotion     Trunk/Postural Assessment     Balance Balance Balance Assessed: Yes Static Sitting  Balance Static Sitting - Balance Support: Right upper extremity supported;Feet supported Static Sitting - Level of Assistance: 5: Stand by assistance Dynamic Sitting Balance Dynamic Sitting - Balance Support: Feet supported;During functional activity Dynamic Sitting - Level of Assistance: 4: Min assist Dynamic Sitting - Balance Activities: Lateral lean/weight shifting;Forward lean/weight shifting Static Standing Balance Static Standing - Balance Support: Right  upper extremity supported;Left upper extremity supported;During functional activity Static Standing - Level of Assistance: 3: Mod assist Dynamic Standing Balance Dynamic Standing - Balance Support: Bilateral upper extremity supported;During functional activity Dynamic Standing - Level of Assistance: 3: Mod assist Dynamic Standing - Balance Activities: Lateral lean/weight shifting;Forward lean/weight shifting Extremity Assessment      RLE Assessment RLE Assessment: Exceptions to [facility] RLE Strength RLE Overall Strength: Deficits;Due to premorbid status RLE Overall Strength Comments: Overall 4/5 LLE Assessment LLE Assessment: Exceptions to [facility] LLE Strength LLE Overall Strength: Deficits;Due to premorbid status LLE Overall Strength Comments: Overall 4/5  FIM:  FIM - Bed/Chair Transfer Bed/Chair Transfer Assistive Devices: HOB elevated;Bed rails;Walker Bed/Chair Transfer: 3: Supine > Sit: Mod A (lifting assist/Pt. 50-74%/lift 2 legs;3: Sit > Supine: Mod A (lifting assist/Pt. 50-74%/lift 2 legs);2: Bed > Chair or W/C: Max A (lift and lower assist) FIM - Locomotion: Wheelchair Locomotion: Wheelchair: 0: Activity did not occur FIM - Locomotion: Ambulation Locomotion: Ambulation: 0: Activity did not occur FIM - Locomotion: Stairs Locomotion: Stairs: 0: Activity did not occur   Refer to Care Plan for Long Term Goals  Recommendations for other services: None  Discharge Criteria: Patient will be discharged from PT if patient  refuses treatment 3 consecutive times without medical reason, if treatment goals not met, if there is a change in medical status, if patient makes no progress towards goals or if patient is discharged from hospital.  The above assessment, treatment plan, treatment alternatives and goals were discussed and mutually agreed upon: by patient and by family  Rada Hay 06/22/2014, 9:34 AM

## 2014-06-22 NOTE — Evaluation (Signed)
The skilled treatment note has been reviewed and SLP is in agreement.  Lofton Leon, M.A., CCC-SLP  [phone]   

## 2014-06-22 NOTE — Progress Notes (Signed)
INITIAL NUTRITION ASSESSMENT  Pt meets criteria for NON-SEVERE (MODERATE) MALNUTRITION in the context of chronic illness as evidenced by a 6% weight loss in one month and moderate muscle mass loss.  DOCUMENTATION CODES Per approved criteria  -Non-severe (moderate) malnutrition in the context of chronic illness   INTERVENTION: Provide vanilla Magic cup TID between meals, each supplement provides 290 kcal and 9 grams of protein.  Provide vanilla Ensure Pudding po once daily, each supplement provides 170 kcal and 4 grams of protein.  Encouraged PO intake.  NUTRITION DIAGNOSIS: Increased nutrient needs related to chronic illness, COPD as evidenced by estimated nutrition needs.   Goal: Pt to meet >/= 90% of their estimated nutrition needs   Monitor:  PO intake, weight trends, labs, I/O's  Reason for Assessment: MST  73 y.o. male  Admitting Dx: Debilitated  ASSESSMENT: Pt with history of COPD, atrial fibrillation on Eliquis and remote seizure disorder. Patient lives with his wife uses a rolling walker prior to admission. Admitted 06/11/2014 after a fall at home when standing from a sitting position and struck his head. Currently maintained on a dysphagia 1 pudding thick liquid diet.  Pt reports his appetite has been improving. Wife was present during time of visit and reports he has been eating a little more everyday. Pt reports over the past month and prior to admission to Rehab, pt has had a decreased appetite. Pt reports he has lost weight with his usual body weight of 205 lbs, where he last weight one month ago. Pt with a 6% weight loss in 1 month. Pt reports he is willing to try oral supplements. Will order. Pt was encouraged to eat his food at meals and to eat his oral supplement.  Nutrition Focused Physical Exam:  Subcutaneous Fat:  Orbital Region: N/A Upper Arm Region: WNL Thoracic and Lumbar Region: WNL  Muscle:  Temple Region: Moderate depletion Clavicle Bone Region:  WNL Clavicle and Acromion Bone Region: WNL Scapular Bone Region: N/A Dorsal Hand: Moderate depletion Patellar Region: WNL Anterior Thigh Region: Moderate depletion Posterior Calf Region: WNL  Edema: +1 generalized  Labs:Low potassium and calcium High CO2, BUN, and ALT.  Height: Ht Readings from Last 1 Encounters:  06/14/14 5\' 11"  (1.803 m)    Weight: Wt Readings from Last 1 Encounters:  06/22/14 192 lb 0.3 oz (87.1 kg)    Ideal Body Weight: 172 lbs  % Ideal Body Weight: 112%  Wt Readings from Last 10 Encounters:  06/22/14 192 lb 0.3 oz (87.1 kg)  06/21/14 193 lb 14.4 oz (87.952 kg)  05/24/14 205 lb (92.987 kg)  03/11/14 235 lb (106.595 kg)  03/07/14 235 lb (106.595 kg)  01/10/14 230 lb 3.2 oz (104.418 kg)  09/06/13 234 lb (106.142 kg)  08/10/13 227 lb 11.2 oz (103.284 kg)  07/12/13 226 lb 12.8 oz (102.876 kg)  07/06/13 225 lb (102.059 kg)    Usual Body Weight: 205 lbs  % Usual Body Weight: 94%  BMI:  Body mass index is 26.79 kg/(m^2).  Estimated Nutritional Needs: Kcal: 1950-2150 Protein: 105-115 grams Fluid: 1.95- 2.15 L/day  Skin: Stage II pressure ulcer on sacrum, +1 generalized edema  Diet Order: Dysphagia 1 with pudding thick liquids  EDUCATION NEEDS: -No education needs identified at this time   Intake/Output Summary (Last 24 hours) at 06/22/14 1025 Last data filed at 06/22/14 0800  Gross per 24 hour  Intake     70 ml  Output      0 ml  Net  70 ml    Last BM: 10/6  Labs:   Recent Labs Lab 06/19/14 0407 06/20/14 0400 06/21/14 0600 06/22/14 0520  NA 144 143 149* 144  K 3.2* 3.0* 3.2* 2.9*  CL 96 98 102 101  CO2 34* 34* 36* 34*  BUN 28* 28* 30* 26*  CREATININE 0.64 0.67 0.79 0.68  CALCIUM 8.8 8.8 8.6 8.2*  MG 2.0 1.9 1.9  --   PHOS 2.6 2.8 2.8  --   GLUCOSE 159* 152* 148* 147*    CBG (last 3)   Recent Labs  06/22/14 0014 06/22/14 0425 06/22/14 0753  GLUCAP 151* 152* 166*    Scheduled Meds: . antiseptic oral  rinse  7 mL Mouth Rinse QID  . apixaban  5 mg Oral BID  . atorvastatin  10 mg Oral q1800  . chlorhexidine  15 mL Mouth Rinse BID  . diltiazem  60 mg Oral 4 times per day  . furosemide  40 mg Oral BID  . insulin aspart  0-9 Units Subcutaneous TID WC  . ipratropium-albuterol  3 mL Nebulization Q6H  . metoprolol tartrate  12.5 mg Oral BID  . potassium chloride  20 mEq Oral BID    Continuous Infusions: . sodium chloride 75 mL/hr (06/21/14 1837)    Past Medical History  Diagnosis Date  . COPD (chronic obstructive pulmonary disease)   . Diabetes mellitus   . Atrial fibrillation   . Peripheral neuropathy   . Gallstones   . Peripheral vascular disease   . IBS (irritable bowel syndrome)   . Peripheral arterial disease   . Tobacco abuse   . Hypertension   . Hyperlipidemia     Past Surgical History  Procedure Laterality Date  . Back surgery      Kallie Locks, MS, RD, Provisional LDN Pager # [phone] After hours/ weekend pager # [phone]

## 2014-06-22 NOTE — Discharge Instructions (Signed)
Information on my medicine - ELIQUIS (apixaban)  This medication education was reviewed with me or my healthcare representative as part of my discharge preparation.  The pharmacist that spoke with me during my hospital stay was:  Steffanie Dunn, PharmD  Why was Eliquis prescribed for you? Eliquis was prescribed for you to reduce the risk of a blood clot forming that can cause a stroke if you have a medical condition called atrial fibrillation (a type of irregular heartbeat).  What do You need to know about Eliquis ? Take your Eliquis 5 mg TWICE DAILY - one tablet in the morning and one tablet in the evening with or without food. If you have difficulty swallowing the tablet whole please discuss with your pharmacist how to take the medication safely.  Take Eliquis exactly as prescribed by your doctor and DO NOT stop taking Eliquis without talking to the doctor who prescribed the medication.  Stopping may increase your risk of developing a stroke.  Refill your prescription before you run out.  After discharge, you should have regular check-up appointments with your healthcare provider that is prescribing your Eliquis.  In the future your dose may need to be changed if your kidney function or weight changes by a significant amount or as you get older.  What do you do if you miss a dose? If you miss a dose, take it as soon as you remember on the same day and resume taking twice daily.  Do not take more than one dose of ELIQUIS at the same time to make up a missed dose.  Important Safety Information A possible side effect of Eliquis is bleeding. You should call your healthcare provider right away if you experience any of the following:   Bleeding from an injury or your nose that does not stop.   Unusual colored urine (red or dark brown) or unusual colored stools (red or black).   Unusual bruising for unknown reasons.   A serious fall or if you hit your head (even if there is no bleeding).  Some  medicines may interact with Eliquis and might increase your risk of bleeding or clotting while on Eliquis. To help avoid this, consult your healthcare provider or pharmacist prior to using any new prescription or non-prescription medications, including herbals, vitamins, non-steroidal anti-inflammatory drugs (NSAIDs) and supplements.  This website has more information on Eliquis (apixaban): http://www.eliquis.com/eliquis/home

## 2014-06-22 NOTE — H&P (Signed)
Critical lab value of potassium 2.9 called in from the lab. Dr Naaman Plummer notified, he is currently rounding on patient.  Carylon Perches RN

## 2014-06-22 NOTE — Progress Notes (Signed)
Chart and note reviewed. Agree with note.  Audine Mangione, RD, LDN, CNSC Pager [phone] After Hours Pager [phone]   

## 2014-06-23 ENCOUNTER — Inpatient Hospital Stay (HOSPITAL_COMMUNITY): Payer: Medicare Other | Admitting: Rehabilitation

## 2014-06-23 ENCOUNTER — Inpatient Hospital Stay (HOSPITAL_COMMUNITY): Payer: Medicare Other | Admitting: Speech Pathology

## 2014-06-23 ENCOUNTER — Encounter (HOSPITAL_COMMUNITY): Payer: Medicare Other | Admitting: Occupational Therapy

## 2014-06-23 DIAGNOSIS — R198 Other specified symptoms and signs involving the digestive system and abdomen: Secondary | ICD-10-CM

## 2014-06-23 DIAGNOSIS — I482 Chronic atrial fibrillation: Secondary | ICD-10-CM

## 2014-06-23 LAB — CBC
HCT: 50.4 % (ref 39.0–52.0)
Hemoglobin: 17 g/dL (ref 13.0–17.0)
MCH: 30.5 pg (ref 26.0–34.0)
MCHC: 33.7 g/dL (ref 30.0–36.0)
MCV: 90.5 fL (ref 78.0–100.0)
PLATELETS: 161 10*3/uL (ref 150–400)
RBC: 5.57 MIL/uL (ref 4.22–5.81)
RDW: 15 % (ref 11.5–15.5)
WBC: 20.3 10*3/uL — ABNORMAL HIGH (ref 4.0–10.5)

## 2014-06-23 LAB — BASIC METABOLIC PANEL
ANION GAP: 12 (ref 5–15)
ANION GAP: 8 (ref 5–15)
BUN: 20 mg/dL (ref 6–23)
BUN: 18 mg/dL (ref 6–23)
CALCIUM: 8 mg/dL — AB (ref 8.4–10.5)
CHLORIDE: 98 meq/L (ref 96–112)
CO2: 30 meq/L (ref 19–32)
CO2: 34 meq/L — AB (ref 19–32)
CREATININE: 0.65 mg/dL (ref 0.50–1.35)
Calcium: 8.1 mg/dL — ABNORMAL LOW (ref 8.4–10.5)
Chloride: 99 mEq/L (ref 96–112)
Creatinine, Ser: 0.67 mg/dL (ref 0.50–1.35)
GFR calc Af Amer: >90 mL/min (ref >90)
GFR calc Af Amer: >90 mL/min (ref >90)
GFR calc non Af Amer: >90 mL/min (ref >90)
GFR calc non Af Amer: >90 mL/min (ref >90)
Glucose, Bld: 144 mg/dL — ABNORMAL HIGH (ref 70–99)
Glucose, Bld: 161 mg/dL — ABNORMAL HIGH (ref 70–99)
Potassium: 2.5 mEq/L — CL (ref 3.7–5.3)
Potassium: 2.7 mEq/L — CL (ref 3.7–5.3)
SODIUM: 140 meq/L (ref 137–147)
Sodium: 141 mEq/L (ref 137–147)

## 2014-06-23 LAB — URINALYSIS, ROUTINE W REFLEX MICROSCOPIC
Glucose, UA: NEGATIVE mg/dL
HGB URINE DIPSTICK: NEGATIVE
Ketones, ur: NEGATIVE mg/dL
Leukocytes, UA: NEGATIVE
Nitrite: NEGATIVE
Protein, ur: 100 mg/dL — AB
Specific Gravity, Urine: 1.027 (ref 1.005–1.030)
UROBILINOGEN UA: 1 mg/dL (ref 0.0–1.0)
pH: 5.5 (ref 5.0–8.0)

## 2014-06-23 LAB — GLUCOSE, CAPILLARY
GLUCOSE-CAPILLARY: 158 mg/dL — AB (ref 70–99)
GLUCOSE-CAPILLARY: 221 mg/dL — AB (ref 70–99)
GLUCOSE-CAPILLARY: 145 mg/dL — AB (ref 70–99)
GLUCOSE-CAPILLARY: 165 mg/dL — AB (ref 70–99)
Glucose-Capillary: 181 mg/dL — ABNORMAL HIGH (ref 70–99)
Glucose-Capillary: 199 mg/dL — ABNORMAL HIGH (ref 70–99)

## 2014-06-23 LAB — URINE MICROSCOPIC-ADD ON

## 2014-06-23 MED ORDER — POTASSIUM CHLORIDE IN NACL 20-0.45 MEQ/L-% IV SOLN
INTRAVENOUS | Status: DC
  Administered 2014-06-23 – 2014-06-24 (×2): via INTRAVENOUS
  Filled 2014-06-23 (×3): qty 1000

## 2014-06-23 MED ORDER — SODIUM CHLORIDE 0.9 % IJ SOLN
10.0000 mL | INTRAMUSCULAR | Status: DC | PRN
  Administered 2014-06-23 – 2014-06-25 (×10): 10 mL
  Administered 2014-06-26 – 2014-06-27 (×2): 30 mL
  Administered 2014-06-28 (×3): 10 mL

## 2014-06-23 MED ORDER — METRONIDAZOLE 500 MG PO TABS
500.0000 mg | ORAL_TABLET | Freq: Three times a day (TID) | ORAL | Status: DC
  Administered 2014-06-23 – 2014-06-24 (×4): 500 mg via ORAL
  Filled 2014-06-23 (×7): qty 1

## 2014-06-23 MED ORDER — POTASSIUM CHLORIDE CRYS ER 20 MEQ PO TBCR
40.0000 meq | EXTENDED_RELEASE_TABLET | Freq: Three times a day (TID) | ORAL | Status: DC
  Administered 2014-06-23 – 2014-06-24 (×3): 40 meq via ORAL
  Filled 2014-06-23 (×7): qty 2

## 2014-06-23 MED ORDER — SACCHAROMYCES BOULARDII 250 MG PO CAPS
250.0000 mg | ORAL_CAPSULE | Freq: Two times a day (BID) | ORAL | Status: DC
  Administered 2014-06-23 – 2014-07-01 (×17): 250 mg via ORAL
  Filled 2014-06-23 (×19): qty 1

## 2014-06-23 MED ORDER — FUROSEMIDE 40 MG PO TABS
40.0000 mg | ORAL_TABLET | Freq: Two times a day (BID) | ORAL | Status: DC
  Administered 2014-06-24 – 2014-06-25 (×3): 40 mg via ORAL
  Filled 2014-06-23 (×6): qty 1

## 2014-06-23 NOTE — Progress Notes (Signed)
Speech Language Pathology Daily Session Note  Patient Details  Name: Chad Morse MRN: [id number] Date of Birth: 1941/09/06  Today's Date: 06/23/2014 SLP Individual Time: 0900-1000 SLP Individual Time Calculation (min): 60 min  Short Term Goals: Week 1: SLP Short Term Goal 1 (Week 1): Patient will utilize external visual aids to recall new, daily information with Max A multimodal cues.   SLP Short Term Goal 2 (Week 1): Patient will demonstrate functional problem solving for basic and familiar tasks with Mod A multimodal cues. SLP Short Term Goal 3 (Week 1): Patient will sustain attention to basic and familiar tasks for 5 minutes with Min A multimodal.  SLP Short Term Goal 4 (Week 1): Patient will identify 1 cognitive and 1 physical deficit with Max A multimodal cues.  SLP Short Term Goal 5 (Week 1): Patient will utilize swallowing compensatory strategies with least restrictive diet to minimize overt s/s of aspiration with Mod  A multimodal cues.  SLP Short Term Goal 6 (Week 1): Patient will orient to time, place, person and situation with Mod A multimodal cues.   Skilled Therapeutic Interventions: Skilled treatment session focused on cognitive-linguistic and dysphagia goals. Upon arrival, patient was upright in bed and agreeable to participate in treatment. SLP facilitated session by providing oral care via suction toothbrush and provided trials of pudding thick liquids via spoon. Patient consumed minimal amounts without overt s/s of aspiration but required Max A multimodal cues for utilization of multiple swallows. 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  Patient continues to demonstrate decreased awareness of deficits and requires continued education for insight/reasoning for therapy. Patient required Mod A multimodal cues for functional problem solving with reading his schedule and recall of  information that he read after a delay. Continue with current plan of care.    FIM:  Comprehension Comprehension Mode: Auditory Comprehension: 4-Understands basic 75 - 89% of the time/requires cueing 10 - 24% of the time Expression Expression Mode: Verbal Expression: 5-Expresses basic needs/ideas: With extra time/assistive device Social Interaction Social Interaction: 3-Interacts appropriately 50 - 74% of the time - May be physically or verbally inappropriate. Problem Solving Problem Solving: 2-Solves basic 25 - 49% of the time - needs direction more than half the time to initiate, plan or complete simple activities Memory Memory: 3-Recognizes or recalls 50 - 74% of the time/requires cueing 25 - 49% of the time FIM - Eating Eating Activity: 5: Supervision/cues;5: Set-up assist for open containers;5: Set-up assist for cut food  Pain Pain Assessment Pain Assessment: No/denies pain  Therapy/Group: Individual Therapy  Rohail Klees 06/23/2014, 2:47 PM

## 2014-06-23 NOTE — Progress Notes (Signed)
[facility] PHYSICAL [facility]ICINE & REHABILITATION     PROGRESS NOTE    Subjective/Complaints: 4 loose bowel movements over night including a few minutes before I entered. Pt denies pain or bloating. Eating ok A  review of systems has been performed and if not noted above is otherwise negative.   Objective: Vital Signs: Blood pressure 138/73, pulse 82, temperature 98 F (36.7 C), temperature source Oral, resp. rate 20, weight 87.1 kg (192 lb 0.3 oz), SpO2 96.00%. No results found.  Recent Labs  06/22/14 0520  WBC 24.5*  HGB 18.1*  HCT 55.1*  PLT 171    Recent Labs  06/22/14 0520 06/23/14 0500  NA 144 140  K 2.9* 2.5*  CL 101 98  GLUCOSE 147* 144*  BUN 26* 20  CREATININE 0.68 0.67  CALCIUM 8.2* 8.0*   CBG (last 3)   Recent Labs  06/22/14 2006 06/22/14 2349 06/23/14 0403  GLUCAP 217* 199* 158*    Wt Readings from Last 3 Encounters:  06/22/14 87.1 kg (192 lb 0.3 oz)  06/21/14 87.952 kg (193 lb 14.4 oz)  05/24/14 92.987 kg (205 lb)    Physical Exam:  Constitutional: He appears well-developed.  HENT: oral mucosa moist  Head: Normocephalic.  Eyes: EOM are normal.  Neck: Normal range of motion. Neck supple. No thyromegaly present.  Cardiovascular:  Cardiac rate controlled. No murmur or rubs. Reg rhythm.  Respiratory: Effort normal and breath sounds normal. No respiratory distress. No wheezes  GI: Soft. Bowel sounds are normal. He exhibits no distension.  Neurological: He is alert and generally appropriate. Fair insight and awareness. .   motor strength is 4/5 bilateral deltoid, bicep, tricep, grip  3 minus bilaterally hip flexors, 4 minus knee extensors, 4 ankle dorsiflexor plantar flexor  Sensation reduced to light touch bilateral lower extremities below the knees. DTR's 1+  Musculoskeletal: trace LE edema.  Skin: numerous small abrasions on legs. Small area of breakdown on the left lateral malleolus  Psych: generally pleasant and cooperative.       Assessment/Plan: 1. Functional deficits secondary to debilitation related to status epilepticus and subsequent respiratory failure which require 3+ hours per day of interdisciplinary therapy in a comprehensive inpatient rehab setting. Physiatrist is providing close team supervision and 24 hour management of active medical problems listed below. Physiatrist and rehab team continue to assess barriers to discharge/monitor patient progress toward functional and medical goals.  FIM: FIM - Bathing Bathing Steps Patient Completed: Chest;Right Arm;Left Arm;Abdomen;Right upper leg;Left upper leg Bathing: 3: Mod-Patient completes 5-7 23f 10 parts or 50-74%  FIM - Upper Body Dressing/Undressing Upper body dressing/undressing steps patient completed: Thread/unthread right sleeve of pullover shirt/dresss;Thread/unthread left sleeve of pullover shirt/dress;Put head through opening of pull over shirt/dress Upper body dressing/undressing: 4: Min-Patient completed 75 plus % of tasks FIM - Lower Body Dressing/Undressing Lower body dressing/undressing: 1: Total-Patient completed less than 25% of tasks     FIM - Air cabin crew Transfers: 0-Activity did not occur  FIM - Control and instrumentation engineer Devices: HOB elevated;Bed rails;Walker Bed/Chair Transfer: 3: Supine > Sit: Mod A (lifting assist/Pt. 50-74%/lift 2 legs;3: Sit > Supine: Mod A (lifting assist/Pt. 50-74%/lift 2 legs);2: Bed > Chair or W/C: Max A (lift and lower assist)  FIM - Locomotion: Wheelchair Locomotion: Wheelchair: 0: Activity did not occur FIM - Locomotion: Ambulation Locomotion: Ambulation: 0: Activity did not occur  Comprehension Comprehension Mode: Auditory Comprehension: 4-Understands basic 75 - 89% of the time/requires cueing 10 - 24% of the time  Expression Expression Mode: Verbal Expression: 5-Expresses basic needs/ideas: With extra time/assistive device  Social Interaction Social  Interaction: 4-Interacts appropriately 75 - 89% of the time - Needs redirection for appropriate language or to initiate interaction.  Problem Solving Problem Solving: 2-Solves basic 25 - 49% of the time - needs direction more than half the time to initiate, plan or complete simple activities  Memory Memory: 3-Recognizes or recalls 50 - 74% of the time/requires cueing 25 - 49% of the time  Medical Problem List and Plan:  1. 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 DVT Prophylaxis/Anticoagulation: Eliquis.  3. Pain Management: Tylenol as needed  4. Dysphagia. Dysphagia 1 pudding thick liquids. IVF  5. Neuropsych: This patient is not capable of making decisions on his own behalf.  6. Skin/Wound Care: Routine skin checks  7. Fluids/Electrolytes/Nutrition: Monitor electrolytes closely. Monitor magnesium and calcium levels, k+.  IV fluids for hydration per below  8. Atrial fibrillation. Continue Cardizem 60 mg 4 times a day, Lopressor 12.5 mg twice a day. Cardiac rate generally controlled (some variation yesterday) 9. Diabetes mellitus.  . Sliding scale insulin. Patient on Glucophage 500 mg twice a day prior to admission. Keep glucophage at 250mg  for now until diarrhea resolves 10. Tobacco abuse /COPD. DuoNeb every 6 hours, Albuterol nebulizer as needed. Check oxygen saturations every shift. Patient on Spiriva 18 mcg daily prior to admission. Provide counseling for tobacco abuse  11. Hyperlipidemia. Lipitor  12. Loose stool: wbc's 25k.  suspicious for c diff. Check stool for c diff today, re-check cbc  -support with IVF and k+ supplement  -encourage po intake  -probiotic  -begin empiric flagyl  LOS (Days) 2 A FACE TO FACE EVALUATION WAS PERFORMED  Chad Morse T 06/23/2014 7:43 AM

## 2014-06-23 NOTE — Plan of Care (Signed)
Problem: RH BLADDER ELIMINATION Goal: RH STG MANAGE BLADDER WITH ASSISTANCE STG Manage Bladder With min Assistance  Outcome: Not Progressing Incont. Wears condom cath.@ night

## 2014-06-23 NOTE — Progress Notes (Signed)
CRITICAL VALUE ALERT  Critical value received: potassium 2.7  Date of notification:  06/23/14  Time of notification:  2841  Critical value read back:yes  Nurse who received alert:  Glenis Smoker RN  MD notified (1st page):  D.[place]lowalu PA  Time of first page:  1248  MD notified (2nd page):  Time of second page:  Responding MD:  D> Hildebran  Time MD responded:  1248

## 2014-06-23 NOTE — Progress Notes (Addendum)
Nursing Note: Dr.Swartz on unit and made aware of criitcal value.Potassium of 2.5.Aslo made aware that pt has had 3 large loose stools this shift.Pt placed on enteric precautions.Stool for c-diff to be sent.wbb

## 2014-06-23 NOTE — Progress Notes (Signed)
Occupational Therapy Session Note  Patient Details  Name: Chad Morse MRN: [id number] Date of Birth: Jun 12, 1941  Today's Date: 06/23/2014 OT Individual Time: 1000-1100 OT Individual Time Calculation (min): 60 min    Short Term Goals: Week 1:  OT Short Term Goal 1 (Week 1): Pt will complete bathing with min assist utilizing AE as needed OT Short Term Goal 2 (Week 1): Pt will complete LB dressing with max assist utilizing AE as needed OT Short Term Goal 3 (Week 1): Pt will complete toilet transfer with mod assist OT Short Term Goal 4 (Week 1): Pt will complete 2/3 toileting steps  Skilled Therapeutic Interventions/Progress Updates:    1:1 self care retraining at sink level with focus on bed mobility to come to EOB with HOB elevated, activity tolerance, sit to stand, basic stand pivot transfers with RW, standing balance without UE support for clothing management, balance reactions, toilet transfers, tolieting etc. Pt required frequent rest breaks throughout session. Pt able to maintain O2 sats >90% with functional activity on 4 liters. Pt with low frustration tolerance with LB dressing to thread pants. Pt with knee hyperextension in standing and pushing on w/c with legs to assist himself with his balance. Delayed balance reactions with LOB forward with clothing management. Wife present for session and observed.  Therapy Documentation Precautions:  Precautions Precautions: Fall Precaution Comments: monitor O2 and HR Restrictions Weight Bearing Restrictions: No Pain:  no c/opain - just fatigue  See FIM for current functional status  Therapy/Group: Individual Therapy  [name] [place] [facility] 06/23/2014, 11:09 AM

## 2014-06-23 NOTE — Progress Notes (Signed)
Physical Therapy Session Note  Patient Details  Name: Chad Morse MRN: [id number] Date of Birth: March 25, 1941  Today's Date: 06/23/2014 PT Individual Time: 1330-1430 PT Individual Time Calculation (min): 60 min   Short Term Goals: Week 1:  PT Short Term Goal 1 (Week 1): Pt will tolerate 60 minutes of therapeutic activity with minimal rest breaks PT Short Term Goal 2 (Week 1): Pt will roll L/R w/ SBA PT Short Term Goal 3 (Week 1): Pt will move supine<>sit w/ SBA PT Short Term Goal 4 (Week 1): Pt will transfer bed<>w/c w/ MinA consistently PT Short Term Goal 5 (Week 1): Pt will ambulate 10' w/ LRAD and ModA  Skilled Therapeutic Interventions/Progress Updates:   Pt received lying in bed asleep, but was easily aroused with voice and light touch.  Wife present during session to observe.  Performed bed mobility at mod A level with HOB flat and without rails to better simulate home.  Pt unable to recall rolling technique and was reaching for therapist once LEs were out of bed, therefore provided max cues on keeping LEs in bed, rolling onto side, then lowering LEs out of bed and using UEs for weight shift into sitting position.  Did well with rolling, however requires mod assist for trunk into sitting.  Transferred to w/c with RW at mod A.  Bed slightly elevated to simulate home with max verbal cues for hand placement.  Remainder of session focused on w/c propulsion for overall strengthening and endurance, gait training for quality and endurance, and ambulation to/from restroom and to recliner in room.  Performed 100' of w/c propulsion using BUE/LEs with intermittent rest breaks but S level.  Mod cues to continue using LEs and to steer R.  Performed 25' of gait x 1 rep with RW at mod A (+2 for chair follow) with max verbal cues for safety and positioning inside of RW, pursed lip breathing throughout, and maintaining position inside of RW.  Once seated, SaO2 was 94% on 2LO2 and HR to 106.  Pt states he  needs to urinate, therefore assisted back to room and ambulated to/from restroom with mod A (again+2 for safety and line management).  Requires total A for managing clothing, however did perform peri care in seated position.  Pt washed/dried hands from w/c level.  Ended session with gait from bed to recliner approx 10' with RW as stated above.  Left in recliner with all needs in reach and wife in room.  Provided max encouragement for pt to sit in chair at least 1 hour.    Therapy Documentation Precautions:  Precautions Precautions: Fall Precaution Comments: monitor O2 and HR Restrictions Weight Bearing Restrictions: No   Vital Signs: Therapy Vitals Temp: 98.4 F (36.9 C) Temp Source: Oral Pulse Rate: 95 Resp: 20 BP: 142/77 mmHg Patient Position (if appropriate): Sitting Oxygen Therapy SpO2: 99 % O2 Device: None (Room air) Pain: Pt with no reports of pain during session.    Locomotion : Ambulation Ambulation/Gait Assistance: 3: Mod assist;1: +2 Total assist Wheelchair Mobility Distance: 100   See FIM for current functional status  Therapy/Group: Individual Therapy  Denice Bors 06/23/2014, 2:42 PM

## 2014-06-23 NOTE — Significant Event (Signed)
crCRITICAL VALUE ALERT  Critical value received:  Potassium 2.5  Date of notification:  06-23-14  Time of notification:  6:50  Critical value read back:Yes.    Nurse who received alert:  Mellody Memos   MD notified (1st page):  Notified patient's nurse/ Rosalee Kaufman   Time of first page:N/A  MD notified (2nd page): N/A  Time of second page:N/A  Responding MD:  N/A  Time MD responded:

## 2014-06-24 ENCOUNTER — Inpatient Hospital Stay (HOSPITAL_COMMUNITY): Payer: Medicare Other

## 2014-06-24 ENCOUNTER — Encounter (HOSPITAL_COMMUNITY): Payer: Medicare Other

## 2014-06-24 DIAGNOSIS — R1314 Dysphagia, pharyngoesophageal phase: Secondary | ICD-10-CM

## 2014-06-24 DIAGNOSIS — I4891 Unspecified atrial fibrillation: Secondary | ICD-10-CM

## 2014-06-24 DIAGNOSIS — D62 Acute posthemorrhagic anemia: Secondary | ICD-10-CM

## 2014-06-24 DIAGNOSIS — R5381 Other malaise: Secondary | ICD-10-CM

## 2014-06-24 DIAGNOSIS — I10 Essential (primary) hypertension: Secondary | ICD-10-CM

## 2014-06-24 DIAGNOSIS — E860 Dehydration: Secondary | ICD-10-CM

## 2014-06-24 DIAGNOSIS — G629 Polyneuropathy, unspecified: Secondary | ICD-10-CM

## 2014-06-24 LAB — GLUCOSE, CAPILLARY
GLUCOSE-CAPILLARY: 131 mg/dL — AB (ref 70–99)
Glucose-Capillary: 156 mg/dL — ABNORMAL HIGH (ref 70–99)
Glucose-Capillary: 118 mg/dL — ABNORMAL HIGH (ref 70–99)
Glucose-Capillary: 154 mg/dL — ABNORMAL HIGH (ref 70–99)

## 2014-06-24 LAB — BASIC METABOLIC PANEL
ANION GAP: 10 (ref 5–15)
BUN: 13 mg/dL (ref 6–23)
CALCIUM: 7.9 mg/dL — AB (ref 8.4–10.5)
CO2: 33 meq/L — AB (ref 19–32)
CREATININE: 0.58 mg/dL (ref 0.50–1.35)
Chloride: 100 mEq/L (ref 96–112)
GFR calc Af Amer: >90 mL/min (ref >90)
GFR calc non Af Amer: >90 mL/min (ref >90)
GLUCOSE: 144 mg/dL — AB (ref 70–99)
Potassium: 3.5 mEq/L — ABNORMAL LOW (ref 3.7–5.3)
Sodium: 143 mEq/L (ref 137–147)

## 2014-06-24 LAB — URINE CULTURE
Colony Count: NO GROWTH
Culture: NO GROWTH

## 2014-06-24 MED ORDER — IPRATROPIUM-ALBUTEROL 0.5-2.5 (3) MG/3ML IN SOLN
3.0000 mL | Freq: Four times a day (QID) | RESPIRATORY_TRACT | Status: DC
  Administered 2014-06-24 (×3): 3 mL via RESPIRATORY_TRACT
  Filled 2014-06-24 (×2): qty 3

## 2014-06-24 MED ORDER — POTASSIUM CHLORIDE CRYS ER 20 MEQ PO TBCR
40.0000 meq | EXTENDED_RELEASE_TABLET | Freq: Two times a day (BID) | ORAL | Status: DC
  Administered 2014-06-24 – 2014-07-01 (×15): 40 meq via ORAL
  Filled 2014-06-24 (×17): qty 2

## 2014-06-24 MED ORDER — IPRATROPIUM-ALBUTEROL 0.5-2.5 (3) MG/3ML IN SOLN
3.0000 mL | Freq: Three times a day (TID) | RESPIRATORY_TRACT | Status: DC
  Administered 2014-06-25 – 2014-06-27 (×6): 3 mL via RESPIRATORY_TRACT
  Filled 2014-06-24 (×7): qty 3

## 2014-06-24 MED ORDER — ALBUTEROL SULFATE (2.5 MG/3ML) 0.083% IN NEBU: 2.5000 mg | INHALATION_SOLUTION | RESPIRATORY_TRACT | Status: DC | PRN

## 2014-06-24 MED ORDER — SODIUM CHLORIDE 0.45 % IV SOLN
INTRAVENOUS | Status: DC
  Administered 2014-06-24 – 2014-06-30 (×3): via INTRAVENOUS

## 2014-06-24 MED ORDER — METFORMIN HCL 500 MG PO TABS
250.0000 mg | ORAL_TABLET | Freq: Two times a day (BID) | ORAL | Status: DC
  Administered 2014-06-24 – 2014-06-28 (×9): 250 mg via ORAL
  Filled 2014-06-24 (×12): qty 1

## 2014-06-24 NOTE — Progress Notes (Signed)
Occupational Therapy Note  Patient Details  Name: Chad Morse MRN: [id number] Date of Birth: [birth year]  Today's Date: 06/24/2014 OT Individual Time: 1400-1430 OT Individual Time Calculation (min): 30 min   Pt denied pain Individual Therapy  Pt engaged in shaving tasks with sit<>stand from w/c at sink and standing at sink to wash face and prepare face for shaving.  Pt transitioned to sit<>stand with focus on eccentric muscle engagement.  Pt engaged in sit<>stand and standing for approx 15 mins without O2.  O2 sats >90% during tasks.  1L O2 placed via n/c at end of session.  Pt required assistance with sitting down softly in w/c after standing.Pt's wife present to observe throughout session.   Leotis Shames [facility] 06/24/2014, 3:00 PM

## 2014-06-24 NOTE — Progress Notes (Signed)
Physical Therapy Session Note  Patient Details  Name: Chad Morse MRN: [id number] Date of Birth: [birth year]  Today's Date: 06/24/2014 PT Individual Time: 1300-1400 PT Individual Time Calculation (min): 60 min   Short Term Goals: Week 1:  PT Short Term Goal 1 (Week 1): Pt will tolerate 60 minutes of therapeutic activity with minimal rest breaks PT Short Term Goal 2 (Week 1): Pt will roll L/R w/ SBA PT Short Term Goal 3 (Week 1): Pt will move supine<>sit w/ SBA PT Short Term Goal 4 (Week 1): Pt will transfer bed<>w/c w/ MinA consistently PT Short Term Goal 5 (Week 1): Pt will ambulate 10' w/ LRAD and ModA  Skilled Therapeutic Interventions/Progress Updates:  1:1. Pt received sitting in w/c, ready for therapy with wife at side. Pt with no reports of pain. Focus this session on functional endurance, transfers and balance. Pt propelled w/c 50-75'x5 with B UE/LE and supervision, demonstrates slow but steady pace. Pt req min-mod A for ambulation 30'x3 with RW, +2 for w/c follow.   Pt engaged in game of horseshoes to target standing balance and endurance reaching to all quadrants with L or R UEs and use of RW for support. Between each toss, pt t/f sit<>stand to target B LE strength and control with emphasis on use of B LE>UEs.   Pt req frequent seated rest throughout session but good tolerance overall. Pt able to maintain SaO2 >90% on 1L throughout session with exception of decreasing to 89% following 3rd bout of ambulation, however, resolved quickly. Pt remained on 1L O2 in room, RN made aware. Pt left sitting in w/c with all needs in reach and wife in room.   Therapy Documentation Precautions:  Precautions Precautions: Fall Precaution Comments: monitor O2 and HR Restrictions Weight Bearing Restrictions: No   Vital Signs: Therapy Vitals Temp: 97.8 F (36.6 C) Temp Source: Oral Pulse Rate: 82 Resp: 20 BP: 139/55 mmHg Patient Position (if appropriate): Sitting Oxygen  Therapy SpO2: 94 % O2 Device: Nasal cannula O2 Flow Rate (L/min): 1 L/min  See FIM for current functional status  Therapy/Group: Individual Therapy  Gilmore Laroche 06/24/2014, 5:36 PM

## 2014-06-24 NOTE — Progress Notes (Signed)
Social Work  Social Work Assessment and Plan  Patient Details  Name: Chad Morse MRN: [id number] Date of Birth: 14-Mar-1941  Today's Date: 06/24/2014  Problem List:  Patient Active Problem List   Diagnosis Date Noted  . Debilitated 06/21/2014  . Debility 06/20/2014  . Protein-calorie malnutrition, severe 06/11/2014  . Status epilepticus 06/10/2014  . Chronic anticoagulation 03/13/2014  . Bilateral leg edema 03/13/2014  . Restless legs 03/07/2014  . Paraplegia 09/06/2013  . Pain in limb 09/06/2013  . 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 Coronary artery disease 08/10/2013  . Essential hypertension 08/10/2013  . Hyperlipidemia 08/10/2013  . Gastrointestinal bleeding, lower 11/10/2012  . Acute blood loss anemia 11/10/2012  . Partial small bowel obstruction 11/07/2012  . Hypokalemia 11/07/2012  . Nausea and vomiting 11/05/2012  . Dehydration 11/05/2012  . Alternating constipation and diarrhea 08/25/2012  . Bronchitis, acute, with chronic airway obstruction 01/27/2012  . Chest pain 01/25/2012  . COPD (chronic obstructive pulmonary disease)   . Peripheral vascular disease   . Permanent atrial fibrillation   . Peripheral neuropathy   . Gallstones   . PULMONARY NODULE, RIGHT LOWER LOBE 08/11/2009  . DIABETES, TYPE 2 08/09/2009  . CAD 08/09/2009  . Campath-induced atrial fibrillation 08/09/2009  . PERIPHERAL VASCULAR DISEASE 08/09/2009  . C O P D 08/09/2009  . RENAL INSUFFICIENCY 08/09/2009   Past Medical History:  Past Medical History  Diagnosis Date  . COPD (chronic obstructive pulmonary disease)   . Diabetes mellitus   . Atrial fibrillation   . Peripheral neuropathy   . Gallstones   . Peripheral vascular disease   . IBS (irritable bowel syndrome)   . Peripheral arterial disease   . Tobacco abuse   . Hypertension   . Hyperlipidemia    Past Surgical History:  Past Surgical History  Procedure  Laterality Date  . Back surgery     Social History:  reports that he has been smoking Cigarettes.  He has been smoking about 0.00 packs per day for the past 60 years. He has never used smokeless tobacco. He reports that he does not drink alcohol or use illicit drugs.  Family / Support Systems Marital Status: Married How Long?: 14 yrs Patient Roles: Spouse;Parent Spouse/Significant Other: wife, Chad Morse @ [phone] or ([phone] Children: daughter, Chad Morse) @ ([phone] Anticipated Caregiver: wife Ability/Limitations of Caregiver: Wife can assist.  Dtr had previous MI and works in a Sports coach firm.  Son died 30 yrs ago from MI. Caregiver Availability: 24/7 Family Dynamics: Wife very supportive and encouraging to pt during interview.  She is here on CIR daily.    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  Defer to wife.   Abuse/Neglect Physical Abuse: Denies Verbal Abuse: Denies Sexual Abuse: Denies Exploitation of patient/patient's resources: Denies Self-Neglect: Denies  Emotional Status Pt's affect, behavior adn adjustment status: Pt appears very fatigued and admits he is tired from therapies.  Answers basic questions appropriately, however, often looks to wife first to answer.  He denies any s/s of depression or anxiety.  Hopeful he will reach a level that he can return home soon.  Will monitor and refer to neuropsych as appropriate. Recent Psychosocial Issues: Chronic health issues.  No  current stressors. Pyschiatric History: None Substance Abuse History: None  Patient / Family Perceptions, Expectations & Goals Pt/Family understanding of illness & functional limitations: Pt and wife with good  understanding of pt's medical issues leading to seizure and resp failure.  Wife notes "low magnesium".  Wife with good appreciation of pt's current functional limitations/ need for CIR.   Premorbid pt/family roles/activities: Pt and wife report pt very sedentary PTA.  Using lift chair at home.  Out to eat approx 3 times / week but little other community level activity.  Wife provides majority of home management. Anticipated changes in roles/activities/participation: Little change anticipated if reaches supervision level as wife has been providing all caregiver support. Pt/family expectations/goals: "I just want to be stronger."  Both hopeful he can be weaned off of this O2 as he was not on this at home.  Community Resources [employer]s: None Premorbid Home Care/DME Agencies: None Transportation available at discharge: yes Resource referrals recommended: Neuropsychology  Discharge Planning Living Arrangements: Spouse/significant other Support Systems: Spouse/significant other;Children Type of Residence: Private residence Insurance underwriter Resources: Education officer, museum (specify) Sports administrator) Financial Resources: Radio broadcast assistant Screen Referred: No Living Expenses: Own Money Management: Spouse Does the patient have any problems obtaining your medications?: No Home Management: wife Patient/Family Preliminary Plans: pt to return home with wife who will resume 24/7 support. Social Work Anticipated Follow Up Needs: HH/OP Expected length of stay: 10-14 days  Clinical Impression Pleasant, oriented gentleman here following seizure/ resp failure and very deconditioned.  Wife at bedside and very involved and supportive.  Both hopeful he can reach supervision levels as this is his premorbid level of function.  Also hope to be able to wean off of O2 as he was not using this at home.  No emotional distress reported or noted - will monitor.  Follow for support and d/c planning needs.  Chad Morse,  Chad Morse 06/24/2014, 8:44 AM

## 2014-06-24 NOTE — IPOC Note (Signed)
Overall Plan of Care [facility]) Patient Details Name: Chad Morse MRN: [id number] DOB: Oct 28, 1940  Admitting Diagnosis: deconditioned after seizure and resp fail  Hospital Problems: Active Problems:   Debilitated     Functional Problem List: Nursing Bladder;Bowel;Medication Management;Nutrition;Pain;Safety;Skin Integrity  PT Balance;Endurance;Motor  OT Balance;Endurance;Motor;Perception;Safety  SLP Cognition;Nutrition  TR         Basic ADL's: OT Grooming;Bathing;Dressing;Toileting     Advanced  ADL's: OT       Transfers: PT Bed Mobility;Bed to Chair;Car;Furniture;Floor  OT Toilet;Tub/Shower     Locomotion: PT Ambulation;Wheelchair Mobility     Additional Impairments: OT None  SLP Swallowing;Social Cognition   Problem Solving;Memory;Attention;Awareness  TR      Anticipated Outcomes Item Anticipated Outcome  Self Feeding    Swallowing  Min A   Basic self-care  supervision UB, mod assist LB dressing  Toileting  supervision   Bathroom Transfers supervision  Bowel/Bladder  min assist  Transfers  Supervision for sit<>stand, supervision for stand pivot transfers  Locomotion  Overall (S)  Communication     Cognition  Min-mod A  Pain  less than 3 out of 10  Safety/Judgment  min assist   Therapy Plan: PT Intensity: Minimum of 1-2 x/day ,45 to 90 minutes PT Frequency: 5 out of 7 days PT Duration Estimated Length of Stay: 14-16 days OT Intensity: Minimum of 1-2 x/day, 45 to 90 minutes OT Frequency: 5 out of 7 days OT Duration/Estimated Length of Stay: 14-16 days SLP Intensity: Minumum of 1-2 x/day, 30 to 90 minutes SLP Frequency: 5 out of 7 days SLP Duration/Estimated Length of Stay: 14-16 days       Team Interventions: Nursing Interventions Patient/Family Education;Bladder Management;Bowel Management;Disease Management/Prevention;Pain Management;Medication Management;Skin Care/Wound Management;Discharge Planning  PT interventions Ambulation/gait  training;Balance/vestibular training;Community reintegration;Discharge planning;Disease management/prevention;DME/adaptive equipment instruction;Functional mobility training;Patient/family education;Therapeutic Activities;Therapeutic Exercise;UE/LE Coordination activities;UE/LE Strength taining/ROM;Wheelchair propulsion/positioning  OT Interventions Balance/vestibular training;Cognitive remediation/compensation;Community reintegration;Discharge planning;Disease mangement/prevention;DME/adaptive equipment instruction;Functional mobility training;Patient/family education;Psychosocial support;Self Care/advanced ADL retraining;Skin care/wound managment;Therapeutic Activities;UE/LE Strength taining/ROM;UE/LE Coordination activities  SLP Interventions Cognitive remediation/compensation;Cueing hierarchy;Dysphagia/aspiration precaution training;Internal/external aids;Patient/family education;Functional tasks;Therapeutic Activities;Environmental controls  TR Interventions    SW/CM Interventions Discharge Planning;Psychosocial Support;Patient/Family Education    Team Discharge Planning: Destination: PT-Home ,OT- Home , SLP-Home Projected Follow-up: PT-Home health PT, OT-  Home health OT, SLP-24 hour supervision/assistance;Home Health SLP Projected Equipment Needs: PT- , OT- To be determined, SLP-To be determined Equipment Details: PT-Pt has Rollator and wheelchair at home, TBD if pt will require more stable ambulation device than Rollator, OT-  Patient/family involved in discharge planning: PT- Patient;Family member/caregiver,  OT-Patient;Family member/caregiver, SLP-Patient;Family member/caregiver  MD ELOS: 14-16 days Medical Rehab Prognosis:  Excellent Assessment: The patient has been admitted for CIR therapies with the diagnosis of debilitation after multiple medical problems. The team will be addressing functional mobility, strength, stamina, balance, safety, adaptive techniques and equipment, self-care,  bowel and bladder mgt, patient and caregiver education, pain mgt, activity tolerance, community reintegration. Goals have been set at supervision to min assist with basic mobility and self-care/ADL's.    Meredith Staggers, MD, FAAPMR      See Team Conference Notes for weekly updates to the plan of care

## 2014-06-24 NOTE — Care Management Note (Signed)
[facility] Individual Statement of Services  Patient Name:  Chad Morse  Date:  06/24/2014  Welcome to the [facility].  Our goal is to provide you with an individualized program based on your diagnosis and situation, designed to meet your specific needs.  With this comprehensive rehabilitation program, you will be expected to participate in at least 3 hours of rehabilitation therapies Monday-Friday, with modified therapy programming on the weekends.  Your rehabilitation program will include the following services:  Physical Therapy (PT), Occupational Therapy (OT), Speech Therapy (ST), 24 hour per day rehabilitation nursing, Therapeutic Recreaction (TR), Case Management (Social Worker), Rehabilitation Medicine, Nutrition Services and Pharmacy Services  Weekly team conferences will be held on Tuesdays to discuss your progress.  Your Social Worker will talk with you frequently to get your input and to update you on team discussions.  Team conferences with you and your family in attendance may also be held.  Expected length of stay: 14 days  Overall anticipated outcome: supervision  Depending on your progress and recovery, your program may change. Your Social Worker will coordinate services and will keep you informed of any changes. Your Social Worker's name and contact numbers are listed  below.  The following services may also be recommended but are not provided by the [facility] will be made to provide these services after discharge if needed.  Arrangements include referral to agencies that provide these services.  Your insurance has been verified to be:  Medicare and [place] Your primary doctor is:  Dr. Velvet Bathe  Pertinent information will be shared with your doctor and your insurance company.  Social  Worker:  [place], [place] or ([phone]   Information discussed with and copy given to patient by: Lennart Pall, 06/24/2014, 8:46 AM

## 2014-06-24 NOTE — Progress Notes (Signed)
Occupational Therapy Session Note  Patient Details  Name: Chad Morse MRN: [id number] Date of Birth: [birth year]  Today's Date: 06/24/2014 OT Individual Time: 1000-1100 OT Individual Time Calculation (min): 60 min    Short Term Goals: Week 1:  OT Short Term Goal 1 (Week 1): Pt will complete bathing with min assist utilizing AE as needed OT Short Term Goal 2 (Week 1): Pt will complete LB dressing with max assist utilizing AE as needed OT Short Term Goal 3 (Week 1): Pt will complete toilet transfer with mod assist OT Short Term Goal 4 (Week 1): Pt will complete 2/3 toileting steps  Skilled Therapeutic Interventions/Progress Updates:    Pt engaged in BADL retraining including bathing and dressing with sit<>stand from w/c at sink.  Pt incontinent of bowel when transferring to w/c and rolled into bathroom to complete toileting tasks.  Pt required tot A for hygiene and clothing management.  Pt completed bathing and dressing tasks with sit<>stand at sink.  Pt performed sit<>stand with steady A primarily when sitting down.  Pt required assistance with bathing feet and donning socks.  Pt's wife present to observe throughout session.  Pt O2 >90% on 2L O2 throughout session.  Focus on activity tolerance, sit<>stand, standing balance, transfers, dynamic standing balance, and safety awareness. Pt required multiple rest breaks throughout session.   Therapy Documentation Precautions:  Precautions Precautions: Fall Precaution Comments: monitor O2 and HR Restrictions Weight Bearing Restrictions: No Pain: Pain Assessment Pain Assessment: No/denies pain  See FIM for current functional status  Therapy/Group: Individual Therapy  Leroy Libman 06/24/2014, 12:01 PM

## 2014-06-24 NOTE — Progress Notes (Signed)
Speech Language Pathology Daily Session Note  Patient Details  Name: Chad Morse MRN: [id number] Date of Birth: 26-Oct-1940  Today's Date: 06/24/2014 SLP Individual Time: 0900-0930 SLP Individual Time Calculation (min): 30 min  Short Term Goals: Week 1: SLP Short Term Goal 1 (Week 1): Patient will utilize external visual aids to recall new, daily information with Max A multimodal cues.   SLP Short Term Goal 2 (Week 1): Patient will demonstrate functional problem solving for basic and familiar tasks with Mod A multimodal cues. SLP Short Term Goal 3 (Week 1): Patient will sustain attention to basic and familiar tasks for 5 minutes with Min A multimodal.  SLP Short Term Goal 4 (Week 1): Patient will identify 1 cognitive and 1 physical deficit with Max A multimodal cues.  SLP Short Term Goal 5 (Week 1): Patient will utilize swallowing compensatory strategies with least restrictive diet to minimize overt s/s of aspiration with Mod  A multimodal cues.  SLP Short Term Goal 6 (Week 1): Patient will orient to time, place, person and situation with Mod A multimodal cues.   Skilled Therapeutic Interventions: SLP treatment focused on providing contextual, verbal and question cues for pt to approriately verbalize orientation, direct care, participate in basic functional problem solving and attention to ADL. Pt is able to verbalize precautions wtih question cue, but needs cues in 100% of trials to complete second swallow. Improving awareness of deficits with pt correctly verbalizing deficits and understanding and initaiting appropriate posture for PO.    FIM:  Comprehension Comprehension Mode: Auditory Comprehension: 4-Understands basic 75 - 89% of the time/requires cueing 10 - 24% of the time Expression Expression Mode: Verbal Expression: 5-Expresses basic 90% of the time/requires cueing < 10% of the time. Social Interaction Social Interaction: 3-Interacts appropriately 50 - 74% of the time - May  be physically or verbally inappropriate. Problem Solving Problem Solving: 2-Solves basic 25 - 49% of the time - needs direction more than half the time to initiate, plan or complete simple activities Memory Memory: 3-Recognizes or recalls 50 - 74% of the time/requires cueing 25 - 49% of the time FIM - Eating Eating Activity: 5: Needs verbal cues/supervision  Pain Pain Assessment Pain Assessment: No/denies pain  Therapy/Group: Individual Therapy  Shanika Levings, Katherene Ponto 06/24/2014, 12:41 PM

## 2014-06-24 NOTE — Plan of Care (Signed)
Problem: RH SKIN INTEGRITY Goal: RH STG SKIN FREE OF INFECTION/BREAKDOWN Outcome: Progressing r heel with foam dressing, r heel off bed w/ pilllow when in bed.bottom pink,skin kept clean and dry

## 2014-06-24 NOTE — Progress Notes (Signed)
[facility] PHYSICAL MEDICINE & REHABILITATION     PROGRESS NOTE    Subjective/Complaints: No further loose stools. Complains of generalized abdominal pain. No nausea. Has appetite. A  review of systems has been performed and if not noted above is otherwise negative.   Objective: Vital Signs: Blood pressure 160/79, pulse 114, temperature 98.6 F (37 C), temperature source Oral, resp. rate 17, weight 87.1 kg (192 lb 0.3 oz), SpO2 95.00%. No results found.  Recent Labs  06/22/14 0520 06/23/14 1005  WBC 24.5* 20.3*  HGB 18.1* 17.0  HCT 55.1* 50.4  PLT 171 161    Recent Labs  06/23/14 1155 06/24/14 0553  NA 141 143  K 2.7* 3.5*  CL 99 100  GLUCOSE 161* 144*  BUN 18 13  CREATININE 0.65 0.58  CALCIUM 8.1* 7.9*   CBG (last 3)   Recent Labs  06/23/14 1628 06/23/14 2115 06/24/14 0712  GLUCAP 145* 165* 131*    Wt Readings from Last 3 Encounters:  06/22/14 87.1 kg (192 lb 0.3 oz)  06/21/14 87.952 kg (193 lb 14.4 oz)  05/24/14 92.987 kg (205 lb)    Physical Exam:  Constitutional: He appears well-developed.  HENT: oral mucosa moist  Head: Normocephalic.  Eyes: EOM are normal.  Neck: Normal range of motion. Neck supple. No thyromegaly present.  Cardiovascular:  Cardiac rate controlled. No murmur or rubs. Reg rhythm.  Respiratory: Effort normal and breath sounds normal. No respiratory distress. No wheezes  GI: Soft. Bowel sounds are normal. He exhibits no distension.  Neurological: He is alert and generally appropriate. Fair insight and awareness. .   motor strength is 4/5 bilateral deltoid, bicep, tricep, grip  3 minus bilaterally hip flexors, 4 minus knee extensors, 4 ankle dorsiflexor plantar flexor  Sensation reduced to light touch bilateral lower extremities below the knees. DTR's 1+  Musculoskeletal: trace LE edema.  Skin: numerous small abrasions on legs. Small area of breakdown on the left lateral malleolus  Psych: generally pleasant and cooperative.       Assessment/Plan: 1. Functional deficits secondary to debilitation related to status epilepticus and subsequent respiratory failure which require 3+ hours per day of interdisciplinary therapy in a comprehensive inpatient rehab setting. Physiatrist is providing close team supervision and 24 hour management of active medical problems listed below. Physiatrist and rehab team continue to assess barriers to discharge/monitor patient progress toward functional and medical goals.  FIM: FIM - Bathing Bathing Steps Patient Completed: Chest;Right Arm;Left Arm;Abdomen;Right upper leg;Left upper leg;Front perineal area Bathing: 3: Mod-Patient completes 5-7 59f 10 parts or 50-74%  FIM - Upper Body Dressing/Undressing Upper body dressing/undressing steps patient completed: Thread/unthread right sleeve of pullover shirt/dresss;Thread/unthread left sleeve of pullover shirt/dress;Put head through opening of pull over shirt/dress Upper body dressing/undressing: 4: Min-Patient completed 75 plus % of tasks FIM - Lower Body Dressing/Undressing Lower body dressing/undressing steps patient completed: Thread/unthread right pants leg Lower body dressing/undressing: 1: Total-Patient completed less than 25% of tasks  FIM - Toileting Toileting: 1: Total-Patient completed zero steps, helper did all 3  FIM - Toilet Transfers Toilet Transfers: 3-To toilet/BSC: Mod A (lift or lower assist);3-From toilet/BSC: Mod A (lift or lower assist)  FIM - Bed/Chair Transfer Bed/Chair Transfer Assistive Devices: Bed rails Bed/Chair Transfer: 3: Supine > Sit: Mod A (lifting assist/Pt. 50-74%/lift 2 legs;3: Bed > Chair or W/C: Mod A (lift or lower assist)  FIM - Locomotion: Wheelchair Distance: 100 Locomotion: Wheelchair: 2: Travels 50 - 149 ft with supervision, cueing or coaxing FIM - Locomotion:  Ambulation Locomotion: Ambulation Assistive Devices: Walker - Rolling Ambulation/Gait Assistance: 3: Mod assist;1: +2 Total  assist Locomotion: Ambulation: 1: Two helpers (+2 for chair follow)  Comprehension Comprehension Mode: Auditory Comprehension: 4-Understands basic 75 - 89% of the time/requires cueing 10 - 24% of the time  Expression Expression Mode: Verbal Expression: 5-Expresses basic needs/ideas: With extra time/assistive device  Social Interaction Social Interaction: 3-Interacts appropriately 50 - 74% of the time - May be physically or verbally inappropriate.  Problem Solving Problem Solving: 2-Solves basic 25 - 49% of the time - needs direction more than half the time to initiate, plan or complete simple activities  Memory Memory: 3-Recognizes or recalls 50 - 74% of the time/requires cueing 25 - 49% of the time  Medical Problem List and Plan:  1. 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 DVT Prophylaxis/Anticoagulation: Eliquis.  3. Pain Management: Tylenol as needed  4. Dysphagia. Dysphagia 1 pudding thick liquids. IVF  5. Neuropsych: This patient is not capable of making decisions on his own behalf.  6. Skin/Wound Care: Routine skin checks  7. Fluids/Electrolytes/Nutrition: k+ replacement,  8. Atrial fibrillation. Continue Cardizem 60 mg 4 times a day, Lopressor 12.5 mg twice a day. Cardiac rate generally controlled   9. Diabetes mellitus.  . Sliding scale insulin. Patient on Glucophage 500 mg twice a day prior to admission. Begin metformin 250mg  bid. 10. Tobacco abuse /COPD. DuoNeb every 6 hours, Albuterol nebulizer as needed. Check oxygen saturations every shift. Patient on Spiriva 18 mcg daily prior to admission. Provide counseling for tobacco abuse  11. Hyperlipidemia. Lipitor  12. Loose stool: no further loose stools, ?hx of IBS  -DC IVF  -encourage po intake  -probiotic  -dc flagyl  -recheck cbc today 13. Hypokalemia: supplemented. Improved today 14. Leukocytosis: no clear source  -recheck cbc today.  -if  persistent elevation, consider CT abdomen  LOS (Days) 3 A FACE TO FACE EVALUATION WAS PERFORMED  Chad Morse T 06/24/2014 7:43 AM

## 2014-06-25 ENCOUNTER — Inpatient Hospital Stay (HOSPITAL_COMMUNITY): Payer: Medicare Other | Admitting: Physical Therapy

## 2014-06-25 ENCOUNTER — Inpatient Hospital Stay (HOSPITAL_COMMUNITY): Payer: Medicare Other | Admitting: Speech Pathology

## 2014-06-25 DIAGNOSIS — E876 Hypokalemia: Secondary | ICD-10-CM

## 2014-06-25 LAB — CBC
HEMATOCRIT: 45.6 % (ref 39.0–52.0)
Hemoglobin: 15.6 g/dL (ref 13.0–17.0)
MCH: 30.3 pg (ref 26.0–34.0)
MCHC: 34.2 g/dL (ref 30.0–36.0)
MCV: 88.5 fL (ref 78.0–100.0)
Platelets: 166 10*3/uL (ref 150–400)
RBC: 5.15 MIL/uL (ref 4.22–5.81)
RDW: 14.4 % (ref 11.5–15.5)
WBC: 18.1 10*3/uL — ABNORMAL HIGH (ref 4.0–10.5)

## 2014-06-25 LAB — GLUCOSE, CAPILLARY
GLUCOSE-CAPILLARY: 111 mg/dL — AB (ref 70–99)
Glucose-Capillary: 150 mg/dL — ABNORMAL HIGH (ref 70–99)
Glucose-Capillary: 114 mg/dL — ABNORMAL HIGH (ref 70–99)
Glucose-Capillary: 146 mg/dL — ABNORMAL HIGH (ref 70–99)

## 2014-06-25 LAB — CLOSTRIDIUM DIFFICILE BY PCR: CDIFFPCR: NEGATIVE

## 2014-06-25 LAB — BASIC METABOLIC PANEL
Anion gap: 8 (ref 5–15)
BUN: 10 mg/dL (ref 6–23)
CO2: 33 mEq/L — ABNORMAL HIGH (ref 19–32)
Calcium: 7.7 mg/dL — ABNORMAL LOW (ref 8.4–10.5)
Chloride: 95 mEq/L — ABNORMAL LOW (ref 96–112)
Creatinine, Ser: 0.55 mg/dL (ref 0.50–1.35)
Glucose, Bld: 123 mg/dL — ABNORMAL HIGH (ref 70–99)
Potassium: 3.4 mEq/L — ABNORMAL LOW (ref 3.7–5.3)
Sodium: 136 mEq/L — ABNORMAL LOW (ref 137–147)

## 2014-06-25 MED ORDER — FUROSEMIDE 40 MG PO TABS
40.0000 mg | ORAL_TABLET | Freq: Every day | ORAL | Status: DC
  Administered 2014-06-26 – 2014-07-01 (×6): 40 mg via ORAL
  Filled 2014-06-25 (×7): qty 1

## 2014-06-25 MED ORDER — TIZANIDINE HCL 4 MG PO TABS
4.0000 mg | ORAL_TABLET | Freq: Every day | ORAL | Status: DC
  Administered 2014-06-25 – 2014-06-30 (×6): 4 mg via ORAL
  Filled 2014-06-25 (×8): qty 1

## 2014-06-25 MED ORDER — HYDROCODONE-ACETAMINOPHEN 5-325 MG PO TABS
1.0000 | ORAL_TABLET | Freq: Four times a day (QID) | ORAL | Status: DC | PRN
  Administered 2014-06-25 – 2014-07-01 (×6): 1 via ORAL
  Filled 2014-06-25 (×5): qty 1

## 2014-06-25 NOTE — Progress Notes (Signed)
Physical Therapy Session Note  Patient Details  Name: Chad Morse MRN: [id number] Date of Birth: 1941/06/06  Today's Date: 06/25/2014 PT Individual Time: 0900-0945 PT Individual Time Calculation (min): 45 min   Short Term Goals: Week 1:  PT Short Term Goal 1 (Week 1): Pt will tolerate 60 minutes of therapeutic activity with minimal rest breaks PT Short Term Goal 2 (Week 1): Pt will roll L/R w/ SBA PT Short Term Goal 3 (Week 1): Pt will move supine<>sit w/ SBA PT Short Term Goal 4 (Week 1): Pt will transfer bed<>w/c w/ MinA consistently PT Short Term Goal 5 (Week 1): Pt will ambulate 10' w/ LRAD and ModA  Skilled Therapeutic Interventions/Progress Updates:  Pt was seen bedside in the am. Pt initially reluctant to participate but agreed with encouragement. Pt transferred supine to edge of bed with mod A. Pt transferred edge of bed to w/c with mod A to stand and min A to pivot with rolling walker. Pt propelled w/c with B UEs 75 feet x 2 with S and 2 rest breaks to reach rehab gym. Pt on 2 liters O2, O2 sats stayed above 90% throughout treatment with HR running between 94 to 110. Pt ambulated with rolling walker 40 feet x 2 with min A. Following treatment pt returned to room and left sitting up in w/c.  Therapy Documentation Precautions:  Precautions Precautions: Fall Precaution Comments: monitor O2 and HR Restrictions Weight Bearing Restrictions: No General:   Pain: Pt c/o 10/10 pain B LEs and abdomen but willing to participate with therapy as able.    Locomotion : Ambulation Ambulation/Gait Assistance: 4: Min assist   See FIM for current functional status  Therapy/Group: Individual Therapy  Dub Amis 06/25/2014, 12:51 PM

## 2014-06-25 NOTE — Progress Notes (Signed)
Patient ID: Chad Morse, male   DOB: 11-12-1940, 73 y.o.   MRN: [id number]   [facility] PHYSICAL MEDICINE & REHABILITATION     PROGRESS NOTE   06/25/14  Subjective/Complaints:  73 y/o admit for CIR with  functional deficits secondary to debilitation after status epilepticus and vent dependent respiratory failure   Uncomfortable night with return of diarrhea and some urinary frequency. Potassium is 3.4 today. Requesting baclofen, zanaflex and vicodin which he has used in the past for PN.  A  review of systems has been performed and if not noted above is otherwise negative.   Past Medical History  Diagnosis Date  . COPD (chronic obstructive pulmonary disease)   . Diabetes mellitus   . Atrial fibrillation   . Peripheral neuropathy   . Gallstones   . Peripheral vascular disease   . IBS (irritable bowel syndrome)   . Peripheral arterial disease   . Tobacco abuse   . Hypertension   . Hyperlipidemia     Patient Vitals for the past 24 hrs:  BP Temp Temp src Pulse Resp SpO2  06/25/14 0722 - - - - - 96 %  06/25/14 0700 164/88 mmHg 97.6 F (36.4 C) Oral 94 18 97 %  06/25/14 0030 149/78 mmHg - - - - -  06/24/14 2205 149/72 mmHg 97.5 F (36.4 C) Oral 95 20 97 %  06/24/14 1928 - - - - - 95 %  06/24/14 1430 139/55 mmHg 97.8 F (36.6 C) Oral 82 20 94 %  06/24/14 1330 - - - - - 89 %     Intake/Output Summary (Last 24 hours) at 06/25/14 0845 Last data filed at 06/24/14 1900  Gross per 24 hour  Intake    480 ml  Output    150 ml  Net    330 ml   CBG (last 3)   Recent Labs  06/24/14 1627 06/24/14 2203 06/25/14 0700  GLUCAP 118* 154* 111*     Objective: Vital Signs: Blood pressure 164/88, pulse 94, temperature 97.6 F (36.4 C), temperature source Oral, resp. rate 18, weight 87.1 kg (192 lb 0.3 oz), SpO2 96.00%. No results found.  Recent Labs  06/23/14 1005 06/25/14 0500  WBC 20.3* 18.1*  HGB 17.0 15.6  HCT 50.4 45.6  PLT 161 166    Recent Labs  06/24/14 0553 06/25/14 0500  NA 143 136*  K 3.5* 3.4*  CL 100 95*  GLUCOSE 144* 123*  BUN 13 10  CREATININE 0.58 0.55  CALCIUM 7.9* 7.7*     Wt Readings from Last 3 Encounters:  06/22/14 87.1 kg (192 lb 0.3 oz)  06/21/14 87.952 kg (193 lb 14.4 oz)  05/24/14 92.987 kg (205 lb)    Physical Exam:  Constitutional: He appears well-developed.  HENT: oral mucosa moist  N/C O2 in place Head: Normocephalic.  Eyes: EOM are normal.  Neck: Normal range of motion. Neck supple. No thyromegaly present.  Cardiovascular:  Cardiac rate controlled. No murmur or rubs. Reg rhythm.  Respiratory: Effort normal and breath sounds normal. No respiratory distress. No wheezes  GI: Soft. Bowel sounds are normal. He exhibits no distension.  Neurological: He is alert and generally appropriate. Fair insight and awareness. .   motor strength is 4/5 bilateral deltoid, bicep, tricep, grip  Musculoskeletal: trace LE edema.  Skin: IVF infusing Psych: generally pleasant and cooperative.    Medical Problem List and Plan:  1. 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 DVT Prophylaxis/Anticoagulation: Eliquis.  3. Pain Management: Tylenol as needed; add vicodin prn 4. Dysphagia. Dysphagia 1 pudding thick liquids. IVF  5. Neuropsych: This patient is not capable of making decisions on his own behalf.  6. Skin/Wound Care: Routine skin checks  7. Fluids/Electrolytes/Nutrition: k+ replacement; recheck potassium in am if diarrhea persists 8. Atrial fibrillation. Continue Cardizem 60 mg 4 times a day, Lopressor 12.5 mg twice a day. Cardiac rate generally controlled   9. Diabetes mellitus.  . Sliding scale insulin. Patient on Glucophage 500 mg twice a day prior to admission. Begin metformin 250mg  bid. 10. Tobacco abuse /COPD. DuoNeb every 6 hours, Albuterol nebulizer as needed. Check oxygen saturations every shift. Patient on Spiriva 18 mcg daily  prior to admission. Provide counseling for tobacco abuse  11. Hyperlipidemia. Lipitor  12. Loose stool:   -DC IVF  -encourage po intake  -probiotic  -dc flagyl  -recheck cbc today 13. Hypokalemia: supplemented. Stable  today 14. Leukocytosis: no clear source  -recheck cbc today.  -if persistent elevation, consider CT abdomen  LOS (Days) 4 A FACE TO FACE EVALUATION WAS PERFORMED  Chad Morse 06/25/2014 8:42 AM

## 2014-06-25 NOTE — Progress Notes (Signed)
Speech Language Pathology Daily Session Note  Patient Details  Name: Chad Morse MRN: [id number] Date of Birth: September 19, 1940  Today's Date: 06/25/2014 SLP Individual Time: 1400-1500 SLP Individual Time Calculation (min): 60 min  Short Term Goals: Week 1: SLP Short Term Goal 1 (Week 1): Patient will utilize external visual aids to recall new, daily information with Max A multimodal cues.   SLP Short Term Goal 2 (Week 1): Patient will demonstrate functional problem solving for basic and familiar tasks with Mod A multimodal cues. SLP Short Term Goal 3 (Week 1): Patient will sustain attention to basic and familiar tasks for 5 minutes with Min A multimodal.  SLP Short Term Goal 4 (Week 1): Patient will identify 1 cognitive and 1 physical deficit with Max A multimodal cues.  SLP Short Term Goal 5 (Week 1): Patient will utilize swallowing compensatory strategies with least restrictive diet to minimize overt s/s of aspiration with Mod  A multimodal cues.  SLP Short Term Goal 6 (Week 1): Patient will orient to time, place, person and situation with Mod A multimodal cues.   Skilled Therapeutic Interventions: Skilled ST intervention provided with focus on dysphagia and cognitive goals. Slp provided snack of pudding consistency. Pt verbally recalled swallow strategies, 100% accuracy. Pt required min verbal cues to utilize strategies correctly with snack. No overt s/s of aspiration noted. Pt completed simple problem solving tasks. Pt was 100% accuracy with 4-step sequencing tasks and 40% accuracy with 6-step sequencing, given mod verbal and visual cues. Pt responded well to cuing and demonstrated independence with call bell.    FIM:  Comprehension Comprehension Mode: Auditory Comprehension: 4-Understands basic 75 - 89% of the time/requires cueing 10 - 24% of the time Expression Expression Mode: Verbal Expression: 5-Expresses basic 90% of the time/requires cueing < 10% of the time. Social  Interaction Social Interaction: 4-Interacts appropriately 75 - 89% of the time - Needs redirection for appropriate language or to initiate interaction. Problem Solving Problem Solving: 3-Solves basic 50 - 74% of the time/requires cueing 25 - 49% of the time Memory Memory: 3-Recognizes or recalls 50 - 74% of the time/requires cueing 25 - 49% of the time FIM - Eating Eating Activity: 5: Needs verbal cues/supervision  Pain Pain Assessment Pain Assessment: No/denies pain  Therapy/Group: Individual Therapy  Neal Trulson, Bernerd Pho 06/25/2014, 3:14 PM

## 2014-06-26 ENCOUNTER — Encounter (HOSPITAL_COMMUNITY): Payer: Medicare Other | Admitting: Occupational Therapy

## 2014-06-26 ENCOUNTER — Inpatient Hospital Stay (HOSPITAL_COMMUNITY): Payer: Medicare Other

## 2014-06-26 LAB — GLUCOSE, CAPILLARY
GLUCOSE-CAPILLARY: 134 mg/dL — AB (ref 70–99)
Glucose-Capillary: 126 mg/dL — ABNORMAL HIGH (ref 70–99)
Glucose-Capillary: 105 mg/dL — ABNORMAL HIGH (ref 70–99)
Glucose-Capillary: 96 mg/dL (ref 70–99)
Glucose-Capillary: 148 mg/dL — ABNORMAL HIGH (ref 70–99)

## 2014-06-26 NOTE — Progress Notes (Signed)
Patient ID: Chad Morse, male   DOB: 04-13-1941, 73 y.o.   MRN: [id number]  Patient ID: Chad Morse, male   DOB: 04-11-1941, 73 y.o.   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  No further complaints. C Diff toxin negative  A  review of systems has been performed and if not noted above is otherwise negative.   Past Medical History  Diagnosis Date  . COPD (chronic obstructive pulmonary disease)   . Diabetes mellitus   . Atrial fibrillation   . Peripheral neuropathy   . Gallstones   . Peripheral vascular disease   . IBS (irritable bowel syndrome)   . Peripheral arterial disease   . Tobacco abuse   . Hypertension   . Hyperlipidemia     Patient Vitals for the past 24 hrs:  BP Temp Temp src Pulse Resp SpO2  06/26/14 0600 139/68 mmHg 98.2 F (36.8 C) Oral 61 18 97 %  06/25/14 2051 139/80 mmHg 98.3 F (36.8 C) Oral 94 18 96 %  06/25/14 2033 - - - - - 98 %  06/25/14 1513 114/66 mmHg 97.8 F (36.6 C) Oral 92 19 97 %  06/25/14 1333 - - - - - 96 %     Intake/Output Summary (Last 24 hours) at 06/26/14 0749 Last data filed at 06/25/14 1800  Gross per 24 hour  Intake    360 ml  Output    200 ml  Net    160 ml   CBG (last 3)   Recent Labs  06/25/14 1124 06/25/14 1636 06/25/14 2048  GLUCAP 150* 114* 146*     Objective: Vital Signs: Blood pressure 139/68, pulse 61, temperature 98.2 F (36.8 C), temperature source Oral, resp. rate 18, weight 87.1 kg (192 lb 0.3 oz), SpO2 97.00%. No results found.  Recent Labs  06/23/14 1005 06/25/14 0500  WBC 20.3* 18.1*  HGB 17.0 15.6  HCT 50.4 45.6  PLT 161 166    Recent Labs  06/24/14 0553 06/25/14 0500  NA 143 136*  K  3.5* 3.4*  CL 100 95*  GLUCOSE 144* 123*  BUN 13 10  CREATININE 0.58 0.55  CALCIUM 7.9* 7.7*     Wt Readings from Last 3 Encounters:  06/22/14 87.1 kg (192 lb 0.3 oz)  06/21/14 87.952 kg (193 lb 14.4 oz)  05/24/14 92.987 kg (205 lb)    Physical Exam:  Constitutional: He appears well-developed.  HENT: oral mucosa moist  N/C O2 in place Head: Normocephalic.  Eyes: EOM are normal.  Neck: Normal range of motion. Neck supple. No thyromegaly present.  Cardiovascular:  Cardiac rate controlled. No murmur or rubs. Reg rhythm.  Respiratory: Effort normal and breath sounds normal. No respiratory distress. No wheezes  GI: Soft. Bowel sounds are normal. He exhibits no distension.  Neurological: He is alert and generally appropriate. Fair insight and awareness. .   motor strength is 4/5 bilateral deltoid, bicep, tricep, grip  Musculoskeletal: trace LE edema.  Skin: IVF infusing Psych: generally pleasant and cooperative.    Medical Problem List and Plan:  1. 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 DVT Prophylaxis/Anticoagulation: Eliquis.  3. Pain Management: Tylenol as needed; add vicodin  prn 4. Dysphagia. Dysphagia 1 pudding thick liquids. IVF  5. Neuropsych: This patient is not capable of making decisions on his own behalf.  6. Skin/Wound Care: Routine skin checks  7. Fluids/Electrolytes/Nutrition: k+ replacement; recheck potassium in am if diarrhea persists 8. Atrial fibrillation. Continue Cardizem 60 mg 4 times a day, Lopressor 12.5 mg twice a day. Cardiac rate generally controlled   9. Diabetes mellitus.  . Well controlled 10. Tobacco abuse /COPD. DuoNeb every 6 hours, Albuterol nebulizer as needed. Check oxygen saturations every shift. Patient on Spiriva 18 mcg daily prior to admission. Provide counseling for tobacco abuse  11. Hyperlipidemia. Lipitor  12. Loose stool:  Resolved.  C Diff negative 13.  Hypokalemia: supplemented. Stable  today 14. Leukocytosis: no clear source.  Will recheck in am   -if persistent elevation, consider CT abdomen  LOS (Days) 5 A FACE TO FACE EVALUATION WAS PERFORMED  [name] 06/26/2014 7:49 AM

## 2014-06-26 NOTE — Progress Notes (Signed)
Occupational Therapy Session Note  Patient Details  Name: Chad Morse MRN: [id number] Date of Birth: 1941-09-14  Today's Date: 06/26/2014 OT Individual Time: 1100-1200 OT Individual Time Calculation (min): 60 min    Short Term Goals: Week 1:  OT Short Term Goal 1 (Week 1): Pt will complete bathing with min assist utilizing AE as needed OT Short Term Goal 2 (Week 1): Pt will complete LB dressing with max assist utilizing AE as needed OT Short Term Goal 3 (Week 1): Pt will complete toilet transfer with mod assist OT Short Term Goal 4 (Week 1): Pt will complete 2/3 toileting steps  Skilled Therapeutic Interventions/Progress Updates:    1:1 self care retraining at sink level with focus on functional ambulation in and out of the bathroom with RW, sit to stand from different surfaces, standing balance with and without UE support, activity tolerance, standing tolerance etc. Pt with very full with urine brief- pt reports he is aware- discussed letting staff know when he needs to go.  Ambulated to bathroom with RW with min A with mod A for LOB posteriorly. Toilet transfer with min A. Pt reported he didn't know if he needed to have a BM- he did go on the toilet and required total A for hygiene. Pt able to tolerate the entire session on room air and maintain sats >95% with rest breaks prn and frequent checks of o2 with pulse ox. Pt able to thread underwear and pants on right side but needed A with left LE.  Pt left in w/c setup in prep for lunch.   Therapy Documentation Precautions:  Precautions Precautions: Fall Precaution Comments: monitor O2 and HR Restrictions Weight Bearing Restrictions: No Pain: No c/o pain See FIM for current functional status  Therapy/Group: Individual Therapy  [name] [facility] 06/26/2014, 11:52 AM

## 2014-06-26 NOTE — Progress Notes (Addendum)
Physical Therapy Session Note  Patient Details  Name: Chad Morse MRN: [id number] Date of Birth: 16-May-1941  Today's Date: 06/26/2014 PT Individual Time: 0820-0900 PT Individual Time Calculation (min): 40 min  Session 2 Time: 1300-1400 Time Calculation (min): 60 min   Short Term Goals: Week 1:  PT Short Term Goal 1 (Week 1): Pt will tolerate 60 minutes of therapeutic activity with minimal rest breaks PT Short Term Goal 2 (Week 1): Pt will roll L/R w/ SBA PT Short Term Goal 3 (Week 1): Pt will move supine<>sit w/ SBA PT Short Term Goal 4 (Week 1): Pt will transfer bed<>w/c w/ MinA consistently PT Short Term Goal 5 (Week 1): Pt will ambulate 10' w/ LRAD and ModA  Skilled Therapeutic Interventions/Progress Updates:    Session 1: At scheduled therapy time, pt supine in bed asleep, easily aroused, reporting he had not received breakfast yet. Pt requested to have opportunity to eat before starting therapy. RN notified, pt set up w/ breakfast tray w/ RN providing full supervision. Therapist returned after 20 minutes, pt agreeable to participate. Supine>sit w/ MinA, Donned pants w/ steadying assist at EOB. Propelled w/c to rehab gym w/ overall MinA, several rest breaks. Educated on energy conservation during rest breaks, stopping at 5/10 fatigue level. SPT at MinGuard level throughout session w/ RW. Foot taps on 7 inch stair in standsing for dynamic balance and functional endurance 20x. 60' obstacle course in gym around cones, at 69' pt reported feeling very winded and needing to sit down. Pt able to make it last 10 feet to mat table, 1 LOB while turning to sit requiring ModA to correct. Propelled w/c 11' w/ MinA then transported back to room. Left seated in w/c w/ QRB on and all needs within reach.  Session 2: Pt received seated in w/c, agreeable to participate in therapy. Propelled w/c to rehab gym for functional endurance w/ overall MinA, several rest breaks, pt able to push for 30-50' at a  time before requiring rest break. Pt performed Timed Up and Go Test w/ results of 39 seconds and 49 seconds, requiring ModA on first trial and MinA on second trial to ambulate and consistent assist to manage oxygen tubing. Pt perseverative on insurance/billing during session, difficult to redirect. Pt completed 5' on Nustep L2, able to push for 1' at a time before needing rest break even with cueing to slow down speed. Pt propelled w/c back to room w/ BUE/BLE, continued to require rest breaks every 30-50'. SPT w/ RW and MinGuard A, ModA to move sit>supine to manage BLE. Pt left supine in bed w/ all needs within reach.    Therapy Documentation Precautions:  Precautions Precautions: Fall Precaution Comments: monitor O2 and HR Restrictions Weight Bearing Restrictions: No General:   Vital Signs: Therapy Vitals Temp: 98.2 F (36.8 C) Temp Source: Oral Pulse Rate: 61 Resp: 18 BP: 139/68 mmHg Patient Position (if appropriate): Lying Oxygen Therapy SpO2: 97 % O2 Device: Nasal cannula O2 Flow Rate (L/min): 2 L/min Pain:  Denied pain Mobility:   Locomotion :    Trunk/Postural Assessment :    Balance:   Exercises:   Other Treatments:    See FIM for current functional status  Therapy/Group: Individual Therapy  Rada Hay Rada Hay, PT, DPT 06/26/2014, 7:36 AM

## 2014-06-27 ENCOUNTER — Inpatient Hospital Stay (HOSPITAL_COMMUNITY): Payer: Medicare Other

## 2014-06-27 ENCOUNTER — Encounter (HOSPITAL_COMMUNITY): Payer: Medicare Other

## 2014-06-27 DIAGNOSIS — E860 Dehydration: Secondary | ICD-10-CM

## 2014-06-27 LAB — BASIC METABOLIC PANEL
ANION GAP: 9 (ref 5–15)
BUN: 14 mg/dL (ref 6–23)
CALCIUM: 8.3 mg/dL — AB (ref 8.4–10.5)
CO2: 30 meq/L (ref 19–32)
Chloride: 98 mEq/L (ref 96–112)
Creatinine, Ser: 0.6 mg/dL (ref 0.50–1.35)
GFR calc Af Amer: >90 mL/min (ref >90)
GFR calc non Af Amer: >90 mL/min (ref >90)
Glucose, Bld: 133 mg/dL — ABNORMAL HIGH (ref 70–99)
Potassium: 4.1 mEq/L (ref 3.7–5.3)
Sodium: 137 mEq/L (ref 137–147)

## 2014-06-27 LAB — CBC
HEMATOCRIT: 43.4 % (ref 39.0–52.0)
Hemoglobin: 14.5 g/dL (ref 13.0–17.0)
MCH: 29.7 pg (ref 26.0–34.0)
MCHC: 33.4 g/dL (ref 30.0–36.0)
MCV: 88.9 fL (ref 78.0–100.0)
Platelets: 224 10*3/uL (ref 150–400)
RBC: 4.88 MIL/uL (ref 4.22–5.81)
RDW: 14.4 % (ref 11.5–15.5)
WBC: 14.4 10*3/uL — ABNORMAL HIGH (ref 4.0–10.5)

## 2014-06-27 LAB — GLUCOSE, CAPILLARY
GLUCOSE-CAPILLARY: 169 mg/dL — AB (ref 70–99)
Glucose-Capillary: 136 mg/dL — ABNORMAL HIGH (ref 70–99)
Glucose-Capillary: 170 mg/dL — ABNORMAL HIGH (ref 70–99)
Glucose-Capillary: 74 mg/dL (ref 70–99)

## 2014-06-27 MED ORDER — IPRATROPIUM-ALBUTEROL 0.5-2.5 (3) MG/3ML IN SOLN
3.0000 mL | Freq: Two times a day (BID) | RESPIRATORY_TRACT | Status: DC
  Administered 2014-06-27 – 2014-06-28 (×2): 3 mL via RESPIRATORY_TRACT
  Filled 2014-06-27 (×2): qty 3

## 2014-06-27 NOTE — Progress Notes (Signed)
Physical Therapy Session Note  Patient Details  Name: Chad Morse MRN: [id number] Date of Birth: 05-04-1941  Today's Date: 06/27/2014 PT Individual Time: Treatment Session 1: [id number]; Treatment Session 2: [id number]; Treatment Session 3: 9518-8416 (make up session) PT Individual Time Calculation (min): Treatment Session 1: 45 min; Treatment Session 2: 24min; Treatment Session 3: 34min (make up session)  Short Term Goals: Week 1:  PT Short Term Goal 1 (Week 1): Pt will tolerate 60 minutes of therapeutic activity with minimal rest breaks PT Short Term Goal 2 (Week 1): Pt will roll L/R w/ SBA PT Short Term Goal 3 (Week 1): Pt will move supine<>sit w/ SBA PT Short Term Goal 4 (Week 1): Pt will transfer bed<>w/c w/ MinA consistently PT Short Term Goal 5 (Week 1): Pt will ambulate 10' w/ LRAD and ModA  Skilled Therapeutic Interventions/Progress Updates:  Treatment Session 1:  1:1. Pt received supine in bed, reported being incontinent of bowel and bladder but stated that he had called for assistance. RN aware. Focus this session on functional endurance during toileting, w/c propulsion and ambulation. Pt req min A for t/f sup>sit EOB with use of bed rails and all t/f sit<>stand from various surfaces this session including bed, toilet, w/c and couch. Overall, pt with excellent tolerance to standing approx. 70min' during total A for clean up and clothing management.   Pt propelled w/c 50'x3 with B UE and supervision, intermittent min cues for technique to improve w/c propulsion efficiency. Pt req min A for ambulation 70'x2 w/ RW with cues for posture and pace. Pt appears grossly unstable due to global weakness.    Pt able to consistently maintain SaO2 >94% on 2L during session. Pt left sitting in w/c at end of session w/ all needs in reach and quick release belt in place. Nurse tech in room.   Treatment Session 2:  1:1. Pt received sitting in w/c, ready for therapy. Focus this session on  functional endurance during w/c propulsion, short distance ambulation and car transfer training. Pt req close(S)-min A for w/c propulsion 50'x4 w/ use of B UE/LE. Pt req min A for ambulation 10'x2 w/c<>car as well as completion of car transfer with min cues for technique. Pt req prolonged seated rest breaks due to fatigue. SaO2 >94% on 1L O2.   Treatment Session 3: Continuation of tx session 2 as make up session with focus on functional endurance during functional mobility in room and toileting. Pt transported back to room due to noted incontinence of urine contained by brief. Pt reporting that he was aware, but did not mention it to therapist at start of tx session 2. Pt amb 8'x2 in/out of bathroom, req min A for toilet transfer but max A for clothing management, pericare and brief change. Pt with fair tolerance to standing approx. 50min w/ RW, requested O2 to be increased to 2L but returned to 1L at rest at end of session. SaO2 98% on 1L at rest, RN aware. Brief discussion with pt and wife regarding d/c planning process, current level of functional mobility and goals for d/c. Both verbalized understanding. Pt left w/ all needs in reach, quick release belt in place and wife in room.   Therapy Documentation Precautions:  Precautions Precautions: Fall Precaution Comments: monitor O2 and HR Restrictions Weight Bearing Restrictions: No    Vital Signs: Therapy Vitals Temp: 97.8 F (36.6 C) Temp Source: Oral Pulse Rate: 81 Resp: 18 BP: 130/66 mmHg Patient Position (if appropriate): Lying Oxygen Therapy SpO2: 94 %  O2 Device: Nasal cannula O2 Flow Rate (L/min): 2 L/min  See FIM for current functional status  Therapy/Group: Individual Therapy  Gilmore Laroche 06/27/2014, 8:50 AM

## 2014-06-27 NOTE — Progress Notes (Signed)
Occupational Therapy Session Note  Patient Details  Name: Chad Morse MRN: [id number] Date of Birth: August 16, 1941  Today's Date: 06/27/2014 OT Individual Time: 1000-1100 OT Individual Time Calculation (min): 60 min    Short Term Goals: Week 1:  OT Short Term Goal 1 (Week 1): Pt will complete bathing with min assist utilizing AE as needed OT Short Term Goal 2 (Week 1): Pt will complete LB dressing with max assist utilizing AE as needed OT Short Term Goal 3 (Week 1): Pt will complete toilet transfer with mod assist OT Short Term Goal 4 (Week 1): Pt will complete 2/3 toileting steps  Skilled Therapeutic Interventions/Progress Updates:    Pt resting in w/c upon arrival.  Pt requested use of toilet and amb with RW into bathroom with steady A.  Pt completed toileting tasks requiring assistance for thoroughness with hygiene.  Pt returned to w/c at sink and completed bathing an dressing tasks with sit<>stand from w/c.  Pt required assistance with threading pants over BLE and steady A when standing.  Pt required multiple rest breaks throughout session.  Pt's O2 sats>90% on 2L O2 throughout session.  Focus on activity tolerance, sit<>stand, standing balance, functional amb with RW, and safety awareness.  Therapy Documentation Precautions:  Precautions Precautions: Fall Precaution Comments: monitor O2 and HR Restrictions Weight Bearing Restrictions: No Pain: Pain Assessment Pain Assessment: No/denies pain  See FIM for current functional status  Therapy/Group: Individual Therapy  Leroy Libman 06/27/2014, 11:02 AM

## 2014-06-27 NOTE — Progress Notes (Signed)
Speech Language Pathology Daily Session Note  Patient Details  Name: Chad Morse MRN: [id number] Date of Birth: [birth year]  Today's Date: 06/27/2014 SLP Individual Time: 1330-1415 SLP Individual Time Calculation (min): 45 min  Short Term Goals: Week 1: SLP Short Term Goal 1 (Week 1): Patient will utilize external visual aids to recall new, daily information with Max A multimodal cues.   SLP Short Term Goal 2 (Week 1): Patient will demonstrate functional problem solving for basic and familiar tasks with Mod A multimodal cues. SLP Short Term Goal 3 (Week 1): Patient will sustain attention to basic and familiar tasks for 5 minutes with Min A multimodal.  SLP Short Term Goal 4 (Week 1): Patient will identify 1 cognitive and 1 physical deficit with Max A multimodal cues.  SLP Short Term Goal 5 (Week 1): Patient will utilize swallowing compensatory strategies with least restrictive diet to minimize overt s/s of aspiration with Mod  A multimodal cues.  SLP Short Term Goal 6 (Week 1): Patient will orient to time, place, person and situation with Mod A multimodal cues.   Skilled Therapeutic Interventions: Skilled treatment focused on swallowing and cognitive goals. SLP facilitated session with skilled observation of PO trials consisting of pudding thick liquids. Pt thickened his own drink with Min cues provided by SLP. He utilized his sign as an external memory aid to verbalize his swallowing strategies with Mod I, however required Max cues for utilization of them during trials. Pt completed 4-step sequencing task with Min cues for accuracy. Continue plan of care.   FIM:  Comprehension Comprehension Mode: Auditory Comprehension: 5-Understands basic 90% of the time/requires cueing < 10% of the time Expression Expression Mode: Verbal Expression: 5-Expresses basic 90% of the time/requires cueing < 10% of the time. Social Interaction Social Interaction: 5-Interacts appropriately 90% of the time  - Needs monitoring or encouragement for participation or interaction. Problem Solving Problem Solving: 3-Solves basic 50 - 74% of the time/requires cueing 25 - 49% of the time Memory Memory: 3-Recognizes or recalls 50 - 74% of the time/requires cueing 25 - 49% of the time FIM - Eating Eating Activity: 5: Needs verbal cues/supervision  Pain Pain Assessment Pain Assessment: No/denies pain  Therapy/Group: Individual Therapy   Germain Osgood, M.A. CCC-SLP [phone]  Germain Osgood 06/27/2014, 5:02 PM

## 2014-06-27 NOTE — Plan of Care (Signed)
Problem: RH PAIN MANAGEMENT Goal: RH STG PAIN MANAGED AT OR BELOW PT'S PAIN GOAL Less than 3 out of 10  Outcome: Progressing No c/o pain     

## 2014-06-27 NOTE — Progress Notes (Signed)
[facility] PHYSICAL MEDICINE & REHABILITATION     PROGRESS NOTE    Subjective/Complaints: Stools and bladder better over the weekend. A  review of systems has been performed and if not noted above is otherwise negative.   Objective: Vital Signs: Blood pressure 130/66, pulse 81, temperature 97.8 F (36.6 C), temperature source Oral, resp. rate 18, weight 87.1 kg (192 lb 0.3 oz), SpO2 94.00%. No results found.  Recent Labs  06/25/14 0500 06/27/14 0450  WBC 18.1* 14.4*  HGB 15.6 14.5  HCT 45.6 43.4  PLT 166 224    Recent Labs  06/25/14 0500 06/27/14 0450  NA 136* 137  K 3.4* 4.1  CL 95* 98  GLUCOSE 123* 133*  BUN 10 14  CREATININE 0.55 0.60  CALCIUM 7.7* 8.3*   CBG (last 3)   Recent Labs  06/26/14 2003 06/26/14 2214 06/27/14 0658  GLUCAP 148* 134* 136*    Wt Readings from Last 3 Encounters:  06/22/14 87.1 kg (192 lb 0.3 oz)  06/21/14 87.952 kg (193 lb 14.4 oz)  05/24/14 92.987 kg (205 lb)    Physical Exam:  Constitutional: He appears well-developed.  HENT: oral mucosa moist  Head: Normocephalic.  Eyes: EOM are normal.  Neck: Normal range of motion. Neck supple. No thyromegaly present.  Cardiovascular:  Cardiac rate controlled. No murmur or rubs. Reg rhythm.  Respiratory: Effort normal and breath sounds normal. No respiratory distress. No wheezes  GI: Soft. Bowel sounds are normal. He exhibits no distension.  Neurological: He is alert and generally appropriate. Fair insight and awareness. .   motor strength is 4/5 bilateral deltoid, bicep, tricep, grip  3 minus bilaterally hip flexors, 4 minus knee extensors, 4 ankle dorsiflexor plantar flexor  Sensation reduced to light touch bilateral lower extremities below the knees. DTR's 1+  Musculoskeletal: trace LE edema bilaterally Skin: numerous small abrasions on legs. Small area of breakdown on the left lateral malleolus  Psych: generally pleasant and cooperative.      Assessment/Plan: 1. Functional  deficits secondary to debilitation related to status epilepticus and subsequent respiratory failure which require 3+ hours per day of interdisciplinary therapy in a comprehensive inpatient rehab setting. Physiatrist is providing close team supervision and 24 hour management of active medical problems listed below. Physiatrist and rehab team continue to assess barriers to discharge/monitor patient progress toward functional and medical goals.  FIM: FIM - Bathing Bathing Steps Patient Completed: Chest;Right Arm;Left Arm;Abdomen;Front perineal area;Right upper leg;Left upper leg Bathing: 3: Mod-Patient completes 5-7 3f 10 parts or 50-74%  FIM - Upper Body Dressing/Undressing Upper body dressing/undressing steps patient completed: Thread/unthread right sleeve of pullover shirt/dresss;Thread/unthread left sleeve of pullover shirt/dress;Put head through opening of pull over shirt/dress;Pull shirt over trunk Upper body dressing/undressing: 5: Supervision: Safety issues/verbal cues FIM - Lower Body Dressing/Undressing Lower body dressing/undressing steps patient completed: Thread/unthread right pants leg;Thread/unthread right underwear leg;Thread/unthread left underwear leg Lower body dressing/undressing: 2: Max-Patient completed 25-49% of tasks  FIM - Toileting Toileting: 1: Total-Patient completed zero steps, helper did all 3  FIM - Radio producer Devices: Elevated toilet seat;Grab bars Toilet Transfers: 4-To toilet/BSC: Min A (steadying Pt. > 75%);4-From toilet/BSC: Min A (steadying Pt. > 75%)  FIM - Bed/Chair Transfer Bed/Chair Transfer Assistive Devices: Arm rests;Walker Bed/Chair Transfer: 4: Bed > Chair or W/C: Min A (steadying Pt. > 75%);4: Chair or W/C > Bed: Min A (steadying Pt. > 75%);4: Supine > Sit: Min A (steadying Pt. > 75%/lift 1 leg)  FIM - Locomotion: Wheelchair  Distance: 100 Locomotion: Wheelchair: 2: Travels 27 - 149 ft with supervision, cueing or  coaxing FIM - Locomotion: Ambulation Locomotion: Ambulation Assistive Devices: Administrator Ambulation/Gait Assistance: 4: Min assist Locomotion: Ambulation: 2: Travels 50 - 149 ft with minimal assistance (Pt.>75%)  Comprehension Comprehension Mode: Auditory Comprehension: 4-Understands basic 75 - 89% of the time/requires cueing 10 - 24% of the time  Expression Expression Mode: Verbal Expression: 5-Expresses basic 90% of the time/requires cueing < 10% of the time.  Social Interaction Social Interaction: 3-Interacts appropriately 50 - 74% of the time - May be physically or verbally inappropriate.  Problem Solving Problem Solving: 3-Solves basic 50 - 74% of the time/requires cueing 25 - 49% of the time  Memory Memory: 4-Recognizes or recalls 75 - 89% of the time/requires cueing 10 - 24% of the time  Medical Problem List and Plan:  1. 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 DVT Prophylaxis/Anticoagulation: Eliquis.  3. Pain Management: Tylenol as needed  4. Dysphagia. Dysphagia 1 pudding thick liquids. IVF at HS 5. Neuropsych: This patient is not capable of making decisions on his own behalf.  6. Skin/Wound Care: Routine skin checks  7. Fluids/Electrolytes/Nutrition: k+ replacement,  8. Atrial fibrillation. Continue Cardizem 60 mg 4 times a day, Lopressor 12.5 mg twice a day. Cardiac rate generally controlled    -needs more frequent weight checks 9. Diabetes mellitus.  . Sliding scale insulin. Patient on Glucophage 500 mg twice a day prior to admission. Began metformin 250mg  bid which seems to have helped 10. Tobacco abuse /COPD. DuoNeb every 6 hours, Albuterol nebulizer as needed. Check oxygen saturations every shift. Patient on Spiriva 18 mcg daily prior to admission. Provide counseling for tobacco abuse  11. Hyperlipidemia. Lipitor  12. Loose stool: no further loose stools, ?hx of IBS   -encourage po  intake  -probiotic    13. Hypokalemia: supplemented. Improved today 14. Leukocytosis: some improvement over weekend.  -afebrile currently  LOS (Days) 6 A FACE TO FACE EVALUATION WAS PERFORMED  Chad Morse T 06/27/2014 7:42 AM

## 2014-06-28 ENCOUNTER — Inpatient Hospital Stay (HOSPITAL_COMMUNITY): Payer: Medicare Other

## 2014-06-28 ENCOUNTER — Encounter (HOSPITAL_COMMUNITY): Payer: Medicare Other

## 2014-06-28 LAB — GLUCOSE, CAPILLARY
GLUCOSE-CAPILLARY: 135 mg/dL — AB (ref 70–99)
Glucose-Capillary: 126 mg/dL — ABNORMAL HIGH (ref 70–99)
Glucose-Capillary: 109 mg/dL — ABNORMAL HIGH (ref 70–99)
Glucose-Capillary: 156 mg/dL — ABNORMAL HIGH (ref 70–99)

## 2014-06-28 MED ORDER — GLIMEPIRIDE 2 MG PO TABS
2.0000 mg | ORAL_TABLET | Freq: Every day | ORAL | Status: DC
Start: 2014-06-29 — End: 2014-07-01
  Administered 2014-06-29 – 2014-07-01 (×3): 2 mg via ORAL
  Filled 2014-06-28 (×5): qty 1

## 2014-06-28 NOTE — Patient Care Conference (Signed)
Inpatient RehabilitationTeam Conference and Plan of Care Update Date: 06/28/2014   Time: 3:00 PM    Patient Name: Chad Morse      Medical Record Number: [id number]  Date of Birth: [birth year] Sex: Male         Room/Bed: 4W06C/4W06C-01 Payor Info: Payor: MEDICARE / Plan: MEDICARE PART A AND B / Product Type: *No Product type* /    Admitting Diagnosis: deconditioned after seizure and resp fail  Admit Date/Time:  06/21/2014  4:38 PM Admission Comments: No comment available   Primary Diagnosis:  <principal problem not specified> Principal Problem: <principal problem not specified>  Patient Active Problem List   Diagnosis Date Noted  . Debilitated 06/21/2014  . Debility 06/20/2014  . Protein-calorie malnutrition, severe 06/11/2014  . Status epilepticus 06/10/2014  . Chronic anticoagulation 03/13/2014  . Bilateral leg edema 03/13/2014  . Restless legs 03/07/2014  . Paraplegia 09/06/2013  . Pain in limb 09/06/2013  . 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 Coronary artery disease 08/10/2013  . Essential hypertension 08/10/2013  . Hyperlipidemia 08/10/2013  . Gastrointestinal bleeding, lower 11/10/2012  . Acute blood loss anemia 11/10/2012  . Partial small bowel obstruction 11/07/2012  . Hypokalemia 11/07/2012  . Nausea and vomiting 11/05/2012  . Dehydration 11/05/2012  . Alternating constipation and diarrhea 08/25/2012  . Bronchitis, acute, with chronic airway obstruction 01/27/2012  . Chest pain 01/25/2012  . COPD (chronic obstructive pulmonary disease)   . Peripheral vascular disease   . Permanent atrial fibrillation   . Peripheral neuropathy   . Gallstones   . PULMONARY NODULE, RIGHT LOWER LOBE 08/11/2009  . DIABETES, TYPE 2 08/09/2009  . CAD 08/09/2009  . Campath-induced atrial fibrillation 08/09/2009  . PERIPHERAL VASCULAR DISEASE 08/09/2009  . C O P D 08/09/2009  . RENAL INSUFFICIENCY 08/09/2009     Expected Discharge Date: Expected Discharge Date: 07/06/14  Team Members Present: Physician leading conference: Dr. Alger Simons Social Worker Present: Lennart Pall, LCSW Nurse Present: Elliot Cousin, RN PT Present: Raylene Everts, PT;Bridgett Ripa, Cottie Banda, PT OT Present: Roanna Epley, COTA;Jennifer Jolene Schimke, OT SLP Present: Other (comment) Venita Sheffield., ST) PPS Coordinator present : Daiva Nakayama, RN, CRRN     Current Status/Progress Goal Weekly Team Focus  Medical   deconditioned after seizures, respiratory failure  improve functional mobility,   bowel and bladder mgt, diabetes mgt, skin care, volume mgt   Bowel/Bladder   Continent of bowel.  LBM 06/26/14. Pt incontinent of bladder. Utilizes condom cath  Managed bowel and bladder.   Offer toileting assist q2hrs   Swallow/Nutrition/ Hydration   Dys 1, pudding thick liquids with Max cues for strategies  Min  increase use of strategies, repeat MBS   ADL's   functional transfers-steady A; bathing-min a; dressing-mod A; toileting-max A  supervision overall; LB dressing - mod A  activity tolerance, transfers, standing balance, family education   Mobility   Min A for bed mobility, standing balance and ambulation up to 70' w/ RW; Supervision-min A for w/c propulsion and sitting balance  Supervision overall  activity tolerance, functional endurance, standing balance, family education, d/c planning, ambulation, w/c propulsion   Communication             Safety/Cognition/ Behavioral Observations  Mod-Max  Min  sustained attention, recall of information, basic problem solving   Pain   No c/o pain  <3  Assess for nonverbal cues   Skin   Unstageable pressure ulcer  to R heel. Scaly, flaky skin to R leg, Ecchymosis to BLE  No additional skin breakdown  Encourage pt to turn q 2hrs      *See Care Plan and progress notes for long and short-term goals.  Barriers to Discharge: dysphagia, size, cognitive deficits     Possible Resolutions to Barriers:  supervision, MBS to advance diet, education    Discharge Planning/Teaching Needs:  Home with wife who can provide supervision and assist for LB dsg/ care      Team Discussion:  Significantly deconditioned.  Still incont of b/b - prior issues? Steady assist with ADLs and min assist with mobility.  Off O2!  Plan to repeat MBS this week and hope to upgrade diet.  On track for supervision goals.  Revisions to Treatment Plan:  None   Continued Need for Acute Rehabilitation Level of Care: The patient requires daily medical management by a physician with specialized training in physical medicine and rehabilitation for the following conditions: Daily direction of a multidisciplinary physical rehabilitation program to ensure safe treatment while eliciting the highest outcome that is of practical value to the patient.: Yes Daily medical management of patient stability for increased activity during participation in an intensive rehabilitation regime.: Yes Daily analysis of laboratory values and/or radiology reports with any subsequent need for medication adjustment of medical intervention for : Neurological problems;Pulmonary problems;Post surgical problems  Neysha Criado 06/28/2014, 6:01 PM

## 2014-06-28 NOTE — Progress Notes (Signed)
NUTRITION FOLLOW-UP  Pt meets criteria for NON-SEVERE (MODERATE) MALNUTRITION in the context of chronic illness as evidenced by a 6% weight loss in one month and moderate muscle mass loss.  DOCUMENTATION CODES Per approved criteria  -Non-severe (moderate) malnutrition in the context of chronic illness   INTERVENTION: Continue vanilla Magic cup TID between meals, each supplement provides 290 kcal and 9 grams of protein.  Continue vanilla Ensure Pudding po once daily, each supplement provides 170 kcal and 4 grams of protein.  Encourage PO intake.  NUTRITION DIAGNOSIS: Increased nutrient needs related to chronic illness, COPD as evidenced by estimated nutrition needs; ongoing  Goal: Pt to meet >/= 90% of their estimated nutrition needs; met  Monitor:  PO intake, weight trends, labs, I/O's  73 y.o. male  Admitting Dx: Debilitated  ASSESSMENT: Pt with history of COPD, atrial fibrillation on Eliquis and remote seizure disorder. Patient lives with his wife uses a rolling walker prior to admission. Admitted 06/11/2014 after a fall at home when standing from a sitting position and struck his head. Currently maintained on a dysphagia 1 pudding thick liquid diet.  10/7-Pt reports his appetite has been improving. Wife was present during time of visit and reports he has been eating a little more everyday. Pt reports over the past month and prior to admission to Rehab, pt has had a decreased appetite. Pt reports he has lost weight with his usual body weight of 205 lbs, where he last weight one month ago. Pt with a 6% weight loss in 1 month. Pt reports he is willing to try oral supplements. Will order. Pt was encouraged to eat his food at meals and to eat his oral supplement.  10/13-Meal completion is 100%. Pt reports his appetite is good. Spoke with RN, she reports pt has been eating well and has been using/practicing his appropriate swallowing techniques. Pt reports he has been eating his Ensure  pudding as well as his Magic cups.   Height: Ht Readings from Last 1 Encounters:  06/14/14 _0  (1.803 m)    Weight: Wt Readings from Last 1 Encounters:  06/22/14 192 lb 0.3 oz (87.1 kg)  06/21/14 190 lbs  BMI:  Body mass index is 26.79 kg/(m^2).  Estimated Nutritional Needs: Kcal: 1950-2150 Protein: 105-115 grams Fluid: 1.95- 2.15 L/day  Skin: Stage II pressure ulcer on sacrum, +1 generalized edema  Diet Order: Dysphagia 1 with pudding thick liquids   Intake/Output Summary (Last 24 hours) at 06/28/14 1018 Last data filed at 06/28/14 0800  Gross per 24 hour  Intake    380 ml  Output    225 ml  Net    155 ml    Last BM: 10/12  Labs:   Recent Labs Lab 06/24/14 0553 06/25/14 0500 06/27/14 0450  NA 143 136* 137  K 3.5* 3.4* 4.1  CL 100 95* 98  CO2 33* 33* 30  BUN _1 CREATININE 0.58 0.55 0.60  CALCIUM 7.9* 7.7* 8.3*  GLUCOSE 144* 123* 133*    CBG (last 3)   Recent Labs  06/27/14 1603 06/27/14 2027 06/28/14 0651  GLUCAP 74 169* 135*    Scheduled Meds: . antiseptic oral rinse  7 mL Mouth Rinse QID  . apixaban  5 mg Oral BID  . atorvastatin  10 mg Oral q1800  . chlorhexidine  15 mL Mouth Rinse BID  . diltiazem  60 mg Oral 4 times per day  . feeding supplement (ENSURE)  1 Container Oral Q1500  .  furosemide  40 mg Oral Daily  . insulin aspart  0-9 Units Subcutaneous TID WC  . ipratropium-albuterol  3 mL Nebulization BID  . metFORMIN  250 mg Oral BID WC  . metoprolol tartrate  12.5 mg Oral BID  . potassium chloride  40 mEq Oral BID  . saccharomyces boulardii  250 mg Oral BID  . tiZANidine  4 mg Oral QHS    Continuous Infusions: . sodium chloride 50 mL/hr at 06/25/14 1758    Past Medical History  Diagnosis Date  . COPD (chronic obstructive pulmonary disease)   . Diabetes mellitus   . Atrial fibrillation   . Peripheral neuropathy   . Gallstones   . Peripheral vascular disease   . IBS (irritable bowel syndrome)   . Peripheral  arterial disease   . Tobacco abuse   . Hypertension   . Hyperlipidemia     Past Surgical History  Procedure Laterality Date  . Back surgery      Kallie Locks, MS, RD, LDN Pager # [phone] After hours/ weekend pager # [phone]

## 2014-06-28 NOTE — Progress Notes (Signed)
Speech Language Pathology Daily Session Note  Patient Details  Name: Chad Morse MRN: [id number] Date of Birth: [birth year]  Today's Date: 06/28/2014 SLP Individual Time: 1300-1330 SLP Individual Time Calculation (min): 30 min  Short Term Goals: Week 1: SLP Short Term Goal 1 (Week 1): Patient will utilize external visual aids to recall new, daily information with Max A multimodal cues.   SLP Short Term Goal 2 (Week 1): Patient will demonstrate functional problem solving for basic and familiar tasks with Mod A multimodal cues. SLP Short Term Goal 3 (Week 1): Patient will sustain attention to basic and familiar tasks for 5 minutes with Min A multimodal.  SLP Short Term Goal 4 (Week 1): Patient will identify 1 cognitive and 1 physical deficit with Max A multimodal cues.  SLP Short Term Goal 5 (Week 1): Patient will utilize swallowing compensatory strategies with least restrictive diet to minimize overt s/s of aspiration with Mod  A multimodal cues.  SLP Short Term Goal 6 (Week 1): Patient will orient to time, place, person and situation with Mod A multimodal cues.   Skilled Therapeutic Interventions: Skilled treatment focused on cognitive goals, as pt was received from NT having just finished his lunch meal. SLP facilitated session with Min cues for problem solving and sequencing to accurately complete 6-step sequencing activity. Pt with mild word-finding difficulty noted while describing pictures, which he compensated for with extra time. Pt asked about why he was having swallowing difficulty, and SLP reiterated conversation from previous date about reason for swallowing difficulty, rationale for modified diet and strategies, and plan to repeat objective testing. Would recommend to consider advanced trials with SLP and then likely proceeding with repeat MBS in 1-2 days.    FIM:  Comprehension Comprehension Mode: Auditory Comprehension: 5-Understands basic 90% of the time/requires cueing <  10% of the time Expression Expression Mode: Verbal Expression: 5-Expresses basic 90% of the time/requires cueing < 10% of the time. Social Interaction Social Interaction: 5-Interacts appropriately 90% of the time - Needs monitoring or encouragement for participation or interaction. Problem Solving Problem Solving: 3-Solves basic 50 - 74% of the time/requires cueing 25 - 49% of the time Memory Memory: 2-Recognizes or recalls 25 - 49% of the time/requires cueing 51 - 75% of the time  Pain Pain Assessment Pain Assessment: No/denies pain  Therapy/Group: Individual Therapy   Germain Osgood, M.A. CCC-SLP [phone]  Germain Osgood 06/28/2014, 2:26 PM

## 2014-06-28 NOTE — Progress Notes (Addendum)
[place] PHYSICAL MEDICINE & REHABILITATION     PROGRESS NOTE    Subjective/Complaints: Slept fairly well last night.  A  review of systems has been performed and if not noted above is otherwise negative.   Objective: Vital Signs: Blood pressure 145/72, pulse 77, temperature 98.1 F (36.7 C), temperature source Oral, resp. rate 19, weight 87.1 kg (192 lb 0.3 oz), SpO2 95.00%. No results found.  Recent Labs  06/27/14 0450  WBC 14.4*  HGB 14.5  HCT 43.4  PLT 224    Recent Labs  06/27/14 0450  NA 137  K 4.1  CL 98  GLUCOSE 133*  BUN 14  CREATININE 0.60  CALCIUM 8.3*   CBG (last 3)   Recent Labs  06/27/14 1603 06/27/14 2027 06/28/14 0651  GLUCAP 74 169* 135*    Wt Readings from Last 3 Encounters:  06/22/14 87.1 kg (192 lb 0.3 oz)  06/21/14 87.952 kg (193 lb 14.4 oz)  05/24/14 92.987 kg (205 lb)    Physical Exam:  Constitutional: He appears well-developed.  HENT: oral mucosa moist  Head: Normocephalic.  Eyes: EOM are normal.  Neck: Normal range of motion. Neck supple. No thyromegaly present.  Cardiovascular:  Cardiac rate controlled. No murmur or rubs. Reg rhythm.  Respiratory: Effort normal and breath sounds normal. No respiratory distress. No wheezes  GI: Soft. Bowel sounds are normal. He exhibits no distension.  Neurological: He is alert and generally appropriate. Fair insight and awareness. .   motor strength is 4/5 bilateral deltoid, bicep, tricep, grip  3/5 bilaterally hip flexors, 4- knee extensors, 4 ankle dorsiflexor plantar flexor  Sensation reduced to light touch bilateral lower extremities below the knees. DTR's 1+  Musculoskeletal: trace LE edema bilaterally unchanged Skin: numerous small abrasions on legs. Small area of breakdown on the left lateral malleolus  Psych: generally pleasant and cooperative.      Assessment/Plan: 1. Functional deficits secondary to debilitation related to seizures and multiple medical issues, associated  encephalopathy which require 3+ hours per day of interdisciplinary therapy in a comprehensive inpatient rehab setting. Physiatrist is providing close team supervision and 24 hour management of active medical problems listed below. Physiatrist and rehab team continue to assess barriers to discharge/monitor patient progress toward functional and medical goals.  FIM: FIM - Bathing Bathing Steps Patient Completed: Chest;Right Arm;Left Arm;Abdomen;Front perineal area;Right upper leg;Left upper leg;Buttocks Bathing: 4: Min-Patient completes 8-9 47f 10 parts or 75+ percent  FIM - Upper Body Dressing/Undressing Upper body dressing/undressing steps patient completed: Thread/unthread right sleeve of pullover shirt/dresss;Thread/unthread left sleeve of pullover shirt/dress;Put head through opening of pull over shirt/dress;Pull shirt over trunk Upper body dressing/undressing: 5: Supervision: Safety issues/verbal cues FIM - Lower Body Dressing/Undressing Lower body dressing/undressing steps patient completed: Thread/unthread right pants leg;Pull pants up/down Lower body dressing/undressing: 2: Max-Patient completed 25-49% of tasks  FIM - Toileting Toileting steps completed by patient: Adjust clothing prior to toileting Toileting Assistive Devices: Grab bar or rail for support Toileting: 2: Max-Patient completed 1 of 3 steps  FIM - Radio producer Devices: Walker;Grab Recruitment consultant Transfers: 4-To toilet/BSC: Min A (steadying Pt. > 75%);4-From toilet/BSC: Min A (steadying Pt. > 75%)  FIM - Control and instrumentation engineer Devices: Walker;Arm rests Bed/Chair Transfer: 4: Bed > Chair or W/C: Min A (steadying Pt. > 75%);4: Chair or W/C > Bed: Min A (steadying Pt. > 75%)  FIM - Locomotion: Wheelchair Distance: 100 Locomotion: Wheelchair: 2: Travels 50 - 149 ft with minimal assistance (  Pt.>75%) FIM - Locomotion: Ambulation Locomotion:  Ambulation Assistive Devices: Walker - Rolling Ambulation/Gait Assistance: 4: Min assist Locomotion: Ambulation: 1: Travels less than 50 ft with minimal assistance (Pt.>75%)  Comprehension Comprehension Mode: Auditory Comprehension: 5-Understands basic 90% of the time/requires cueing < 10% of the time  Expression Expression Mode: Verbal Expression: 5-Expresses basic 90% of the time/requires cueing < 10% of the time.  Social Interaction Social Interaction: 5-Interacts appropriately 90% of the time - Needs monitoring or encouragement for participation or interaction.  Problem Solving Problem Solving: 3-Solves basic 50 - 74% of the time/requires cueing 25 - 49% of the time  Memory Memory: 3-Recognizes or recalls 50 - 74% of the time/requires cueing 25 - 49% of the time  Medical Problem List and Plan:  1. Functional deficits secondary to debilitation after seizures, multiple medical issues and associated encephalopathy 2. DVT Prophylaxis/Anticoagulation: Eliquis.  3. Pain Management: Tylenol as needed  4. Dysphagia. Dysphagia 1 pudding thick liquids. Continue IVF at HS 5. Neuropsych: This patient is not capable of making decisions on his own behalf.  6. Skin/Wound Care: Routine skin checks  7. Fluids/Electrolytes/Nutrition: k+ replacement.  recheck k+, mg++, and ca++ tomorrow 8. Atrial fibrillation. Continue Cardizem 60 mg 4 times a day, Lopressor 12.5 mg twice a day. Cardiac rate generally controlled      9. Diabetes mellitus.  . Sliding scale insulin. Patient on Glucophage 500 mg twice a day prior to admission. Began metformin 250mg  bid which seems to have helped---but may be causing diarrhea  -begin amaryl in the morning 10. Tobacco abuse /COPD. DuoNeb every 6 hours, Albuterol nebulizer as needed. Check oxygen saturations every shift. Patient on Spiriva 18 mcg daily prior to admission. Provide counseling for tobacco abuse  11. Hyperlipidemia. Lipitor  12. Loose stool: likely related  to IBS   -encourage appropriate diet  -probiotic added  -dc metformin, start amaryl    13. Hypokalemia: supplemented. Improved today 14. Leukocytosis: continues to normalize.  -afebrile currently and no active signs of infection  LOS (Days) 7 A FACE TO FACE EVALUATION WAS PERFORMED  SWARTZ,ZACHARY T 06/28/2014 7:43 AM

## 2014-06-28 NOTE — Plan of Care (Signed)
Problem: RH Balance Goal: LTG Patient will maintain dynamic standing balance (PT) LTG: Patient will maintain dynamic standing balance with assistance during mobility activities (PT)  Upgraded due to improved balance demonstrated.   Problem: RH Floor Transfers Goal: LTG Patient will perform floor transfers w/assist (PT) LTG: Patient will perform floor transfers with assistance (PT).  Outcome: Not Applicable Date Met:  40/97/35 Goal d/c, do not feel that pt will safely be able to complete due to decreased strength.   Problem: RH Wheelchair Mobility Goal: LTG Patient will propel w/c in community environment (PT) LTG: Patient will propel wheelchair in community environment, # of feet with assist (PT)  Outcome: Not Applicable Date Met:  32/99/24 Goal d/c due to decreased functional endurance.

## 2014-06-28 NOTE — Progress Notes (Addendum)
Physical Therapy Session Note  Patient Details  Name: Chad Morse MRN: [id number] Date of Birth: 1941-06-30  Today's Date: 06/28/2014 PT Individual Time: Treatment Session 1: 0900-1000; Treatment Session 2: 1335-1405 PT Individual Time Calculation (min): Treatment Session 1: 60 min ; Treatment Session 2: 51min  Short Term Goals: Week 1:  PT Short Term Goal 1 (Week 1): Pt will tolerate 60 minutes of therapeutic activity with minimal rest breaks PT Short Term Goal 2 (Week 1): Pt will roll L/R w/ SBA PT Short Term Goal 3 (Week 1): Pt will move supine<>sit w/ SBA PT Short Term Goal 4 (Week 1): Pt will transfer bed<>w/c w/ MinA consistently PT Short Term Goal 5 (Week 1): Pt will ambulate 10' w/ LRAD and ModA  Skilled Therapeutic Interventions/Progress Updates:  Treatment Session 1:  1:1. Pt received sitting in w/c, ready for therapy. Focus this session on functional endurance during ambulation and w/c propulsion, furniture transfers and balance. Pt req supervision for w/c propulsion 70'x1 and 50'x2 with B UE/LE. Pt req min A for ambulation 70x1 and 60x1 with RW, cues for relaxed upper body as well as noted L genu recurvatum approx. 50% of time. Pt req min-mod A during completion of multiple furniture transfers from lower height surfaces and variable arm rests w/ emphasis on not extending L knee for stability.   Pt engaged in 2 rounds of horseshoes to target standing balance and quad control with occasional single UE support by RW and therapist manually facilitating increased L knee flexion. Pt req min A for balance overall.   Pt req frequent and prolonged seated rest breaks throughout session, but pt consistently >90% on RA throughout session. Pt with reported episode of incontinence at end of session, left sitting in w/c with nurse tech in room.   Treatment Session 2:  1:1. Pt received sitting in w/c, ready for therapy. Focus this session on gait training with trial AFO. Pt transported  to/from gym via w/c for energy conservation and time management. Pt amb 50'x1 and 70'x1 with RW and min guard A with use of posterior [place] AFO and heel wedge for increased stability and decreased L genu recurvatum. Despite improvements noted in gait and decreased need for physical assist, pt very adamant about not wanting to use AFO despite education regarding benefits. Pt found to be incontinent at end of session, transported back to room and left sitting in w/c with all needs in reach and wife in room. Nurse tech aware that pt req assistance for clean up.   Therapy Documentation Precautions:  Precautions Precautions: Fall Precaution Comments: monitor O2 and HR Restrictions Weight Bearing Restrictions: No  See FIM for current functional status  Therapy/Group: Individual Therapy  Gilmore Laroche 06/28/2014, 12:24 PM

## 2014-06-28 NOTE — Progress Notes (Signed)
Occupational Therapy Session Note  Patient Details  Name: Chad Morse MRN: [id number] Date of Birth: 1941/09/07  Today's Date: 06/28/2014 OT Individual Time: 0700-0800 OT Individual Time Calculation (min): 60 min    Short Term Goals: Week 1:  OT Short Term Goal 1 (Week 1): Pt will complete bathing with min assist utilizing AE as needed OT Short Term Goal 2 (Week 1): Pt will complete LB dressing with max assist utilizing AE as needed OT Short Term Goal 3 (Week 1): Pt will complete toilet transfer with mod assist OT Short Term Goal 4 (Week 1): Pt will complete 2/3 toileting steps  Skilled Therapeutic Interventions/Progress Updates:    Pt denied pain. Pt resting in bed upon arrival and agreeable to engaging in therapy this morning.  Pt requested to use toilet and amb with RW to bathroom with steady A.  Pt's left knee buckled X 1 and patient required min A to recover.  Pt engaged in bathing and dressing tasks with sit<>stand from w/c at sink.  Pt O2 sats decreased to 89% on RA X 2 during tasks but recovered to 90%+ with pursed lip breathing within 30 seconds. Focus on activity tolerance, functional amb with RW, dynamic standing balance, sit<>stand, energy conservation, and safety awareness.  Therapy Documentation Precautions:  Precautions Precautions: Fall Precaution Comments: monitor O2 and HR Restrictions Weight Bearing Restrictions: No  See FIM for current functional status  Therapy/Group: Individual Therapy  Leroy Libman 06/28/2014, 8:03 AM

## 2014-06-28 NOTE — Progress Notes (Signed)
Social Work Patient ID: Chad Morse, male   DOB: 03-02-1941, 73 y.o.   MRN: [id number]  Lennart Pall, LCSW Social Worker Signed  Patient Care Conference Service date: 06/28/2014 6:00 PM  Inpatient RehabilitationTeam Conference and Plan of Care Update Date: 06/28/2014   Time: 3:00 PM     Patient Name: Chad Morse       Medical Record Number: [id number]   Date of Birth: [birth year] Sex: Male         Room/Bed: 4W06C/4W06C-01 Payor Info: Payor: MEDICARE / Plan: MEDICARE PART A AND B / Product Type: *No Product type* /   Admitting Diagnosis: deconditioned after seizure and resp fail   Admit Date/Time:  06/21/2014  4:38 PM Admission Comments: No comment available   Primary Diagnosis:  <principal problem not specified> Principal Problem: <principal problem not specified>    Patient Active Problem List     Diagnosis  Date Noted   .  Debilitated  06/21/2014   .  Debility  06/20/2014   .  Protein-calorie malnutrition, severe  06/11/2014   .  Status epilepticus  06/10/2014   .  Chronic anticoagulation  03/13/2014   .  Bilateral leg edema  03/13/2014   .  Restless legs  03/07/2014   .  Paraplegia  09/06/2013   .  Pain in limb  09/06/2013   .  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  Coronary artery disease  08/10/2013   .  Essential hypertension  08/10/2013   .  Hyperlipidemia  08/10/2013   .  Gastrointestinal bleeding, lower  11/10/2012   .  Acute blood loss anemia  11/10/2012   .  Partial small bowel obstruction  11/07/2012   .  Hypokalemia  11/07/2012   .  Nausea and vomiting  11/05/2012   .  Dehydration  11/05/2012   .  Alternating constipation and diarrhea  08/25/2012   .  Bronchitis, acute, with chronic airway obstruction  01/27/2012   .  Chest pain  01/25/2012   .  COPD (chronic obstructive pulmonary disease)     .  Peripheral vascular disease     .  Permanent atrial fibrillation     .  Peripheral  neuropathy     .  Gallstones     .  PULMONARY NODULE, RIGHT LOWER LOBE  08/11/2009   .  DIABETES, TYPE 2  08/09/2009   .  CAD  08/09/2009   .  Campath-induced atrial fibrillation  08/09/2009   .  PERIPHERAL VASCULAR DISEASE  08/09/2009   .  C O P D  08/09/2009   .  RENAL INSUFFICIENCY  08/09/2009     Expected Discharge Date: Expected Discharge Date: 07/06/14  Team Members Present: Physician leading conference: Dr. Alger Simons Social Worker Present: Lennart Pall, LCSW Nurse Present: Elliot Cousin, RN PT Present: Raylene Everts, PT;Bridgett Ripa, Cottie Banda, PT OT Present: Roanna Epley, COTA;Jennifer Jolene Schimke, OT SLP Present: Other (comment) Venita Sheffield., ST) PPS Coordinator present : Daiva Nakayama, RN, CRRN        Current Status/Progress  Goal  Weekly Team Focus   Medical     deconditioned after seizures, respiratory failure  improve functional mobility,   bowel and bladder mgt, diabetes mgt, skin care, volume mgt   Bowel/Bladder     Continent of bowel.  LBM 06/26/14. Pt incontinent of bladder. Utilizes condom cath  Managed bowel and bladder.   Offer  toileting assist q2hrs   Swallow/Nutrition/ Hydration     Dys 1, pudding thick liquids with Max cues for strategies  Min  increase use of strategies, repeat MBS   ADL's     functional transfers-steady A; bathing-min a; dressing-mod A; toileting-max A  supervision overall; LB dressing - mod A  activity tolerance, transfers, standing balance, family education   Mobility     Min A for bed mobility, standing balance and ambulation up to 70' w/ RW; Supervision-min A for w/c propulsion and sitting balance  Supervision overall  activity tolerance, functional endurance, standing balance, family education, d/c planning, ambulation, w/c propulsion   Communication            Safety/Cognition/ Behavioral Observations    Mod-Max  Min  sustained attention, recall of information, basic problem solving   Pain     No c/o pain  <3   Assess for nonverbal cues   Skin     Unstageable pressure ulcer to R heel. Scaly, flaky skin to R leg, Ecchymosis to BLE  No additional skin breakdown  Encourage pt to turn q 2hrs     *See Care Plan and progress notes for long and short-term goals.    Barriers to Discharge:  dysphagia, size, cognitive deficits      Possible Resolutions to Barriers:    supervision, MBS to advance diet, education      Discharge Planning/Teaching Needs:    Home with wife who can provide supervision and assist for LB dsg/ care      Team Discussion:    Significantly deconditioned.  Still incont of b/b - prior issues? Steady assist with ADLs and min assist with mobility.  Off O2!  Plan to repeat MBS this week and hope to upgrade diet.  On track for supervision goals.   Revisions to Treatment Plan:    None    Continued Need for Acute Rehabilitation Level of Care: The patient requires daily medical management by a physician with specialized training in physical medicine and rehabilitation for the following conditions: Daily direction of a multidisciplinary physical rehabilitation program to ensure safe treatment while eliciting the highest outcome that is of practical value to the patient.: Yes Daily medical management of patient stability for increased activity during participation in an intensive rehabilitation regime.: Yes Daily analysis of laboratory values and/or radiology reports with any subsequent need for medication adjustment of medical intervention for : Neurological problems;Pulmonary problems;Post surgical problems  Chad Morse 06/28/2014, 6:01 PM

## 2014-06-29 ENCOUNTER — Inpatient Hospital Stay (HOSPITAL_COMMUNITY): Payer: Medicare Other

## 2014-06-29 ENCOUNTER — Inpatient Hospital Stay (HOSPITAL_COMMUNITY): Payer: Medicare Other | Admitting: Speech Pathology

## 2014-06-29 ENCOUNTER — Inpatient Hospital Stay (HOSPITAL_COMMUNITY): Payer: Medicare Other | Admitting: Occupational Therapy

## 2014-06-29 LAB — GLUCOSE, CAPILLARY
Glucose-Capillary: 136 mg/dL — ABNORMAL HIGH (ref 70–99)
Glucose-Capillary: 112 mg/dL — ABNORMAL HIGH (ref 70–99)
Glucose-Capillary: 78 mg/dL (ref 70–99)
Glucose-Capillary: 100 mg/dL — ABNORMAL HIGH (ref 70–99)

## 2014-06-29 LAB — COMPREHENSIVE METABOLIC PANEL
ALK PHOS: 105 U/L (ref 39–117)
ALT: 95 U/L — ABNORMAL HIGH (ref 0–53)
AST: 51 U/L — ABNORMAL HIGH (ref 0–37)
Albumin: 2.8 g/dL — ABNORMAL LOW (ref 3.5–5.2)
Anion gap: 12 (ref 5–15)
BILIRUBIN TOTAL: 0.5 mg/dL (ref 0.3–1.2)
BUN: 15 mg/dL (ref 6–23)
CALCIUM: 9.1 mg/dL (ref 8.4–10.5)
CO2: 33 mEq/L — ABNORMAL HIGH (ref 19–32)
Chloride: 94 mEq/L — ABNORMAL LOW (ref 96–112)
Creatinine, Ser: 0.64 mg/dL (ref 0.50–1.35)
GFR calc non Af Amer: >90 mL/min (ref >90)
GLUCOSE: 141 mg/dL — AB (ref 70–99)
Potassium: 4 mEq/L (ref 3.7–5.3)
Sodium: 139 mEq/L (ref 137–147)
Total Protein: 6 g/dL (ref 6.0–8.3)

## 2014-06-29 LAB — MAGNESIUM: Magnesium: 1.6 mg/dL (ref 1.5–2.5)

## 2014-06-29 NOTE — Progress Notes (Signed)
Speech Language Pathology Daily Session Note  Patient Details  Name: Chad Morse MRN: [id number] Date of Birth: [birth year]  Today's Date: 06/29/2014 SLP Individual Time: 2542-7062 SLP Individual Time Calculation (min): 45 min  Short Term Goals: Week 1: SLP Short Term Goal 1 (Week 1): Patient will utilize external visual aids to recall new, daily information with Max A multimodal cues.   SLP Short Term Goal 2 (Week 1): Patient will demonstrate functional problem solving for basic and familiar tasks with Mod A multimodal cues. SLP Short Term Goal 3 (Week 1): Patient will sustain attention to basic and familiar tasks for 5 minutes with Min A multimodal.  SLP Short Term Goal 4 (Week 1): Patient will identify 1 cognitive and 1 physical deficit with Max A multimodal cues.  SLP Short Term Goal 5 (Week 1): Patient will utilize swallowing compensatory strategies with least restrictive diet to minimize overt s/s of aspiration with Mod  A multimodal cues.  SLP Short Term Goal 6 (Week 1): Patient will orient to time, place, person and situation with Mod A multimodal cues.   Skilled Therapeutic Interventions: Skilled treatment focused on dysphagia and cognitive-linguistic goals. Student facilitated session by providing Max A multimodal cues for utilization of swallowing compensatory strategy of two swallows after each bite during breakfast meal of Dys. 1 textures with pudding thick liquids. Patient utilized multiple coughs and throat clearing after each PO intake. Trials of honey-thick liquids via teaspoon and cup resulted in subtle s/s of aspiration and cough x1. Patient inquired about current swallowing function and reason for multiple swallows, reporting that he "just doesn't understand." Student explained reason for swallowing difficulty, rationale for modified diet and strategies, and plan to repeat objective testing tomorrow. Student also facilitated session by providing Min A multimodal cues for  orientation to date but patient independently utilized schedule to recall and discuss previous, current, and future session for today. Continue with current plan of care.    FIM:  Comprehension Comprehension Mode: Auditory Comprehension: 5-Understands basic 90% of the time/requires cueing < 10% of the time Expression Expression Mode: Verbal Expression: 5-Expresses basic 90% of the time/requires cueing < 10% of the time. Social Interaction Social Interaction: 5-Interacts appropriately 90% of the time - Needs monitoring or encouragement for participation or interaction. Problem Solving Problem Solving: 3-Solves basic 50 - 74% of the time/requires cueing 25 - 49% of the time Memory Memory: 3-Recognizes or recalls 50 - 74% of the time/requires cueing 25 - 49% of the time FIM - Eating Eating Activity: 5: Needs verbal cues/supervision  Pain Pain Assessment Pain Assessment: No/denies pain  Therapy/Group: Individual Therapy  Meriel Kelliher 06/29/2014, 12:02 PM

## 2014-06-29 NOTE — Progress Notes (Addendum)
Physical Therapy Weekly Progress Note  Patient Details  Name: Chad Morse MRN: [id number] Date of Birth: Aug 02, 1941  Beginning of progress report period: June 22, 2014  End of progress report period: June 29, 2014  Today's Date: 06/29/2014 PT Individual Time: 0800-0900 PT Individual Time Calculation (min): 60 min   Pt continues to be very motivated to participate in physical therapy and make slow, but steady progress. Pt has achieved 5/5 STGs, see details below. Pt currently requires supervision for bed mobility with use of hospital bed functions, sitting balance and w/c propulsion up to 75' as well as min A for standing balance with RW and ambulation up to 58' with RW. Pt continues to fatigue easily req seated rest, but demonstrating need for shorter breaks with quicker recovery. Pt consistently able to maintain SaO2 >90% on RA during all therapies. Pt's wife continues to observe therapy sessions when present. Ongoing education with pt and wife regarding pt's current level, goals for physical therapy, d/c planning and goals/beneftis of HH PT.   Patient continues to demonstrate the following deficits: decreased functional endurance, decreased balance, decreased strength, decreased awareness, decreased functional transfers and mobility and therefore will continue to benefit from skilled PT intervention to enhance overall performance with activity tolerance, balance and awareness, overall functional transfers and mobility.  Patient progressing toward long term goals. Continue plan of care.  PT Short Term Goals Week 1:  PT Short Term Goal 1 (Week 1): Pt will tolerate 60 minutes of therapeutic activity with minimal rest breaks PT Short Term Goal 1 - Progress (Week 1): Met PT Short Term Goal 2 (Week 1): Pt will roll L/R w/ SBA PT Short Term Goal 2 - Progress (Week 1): Met PT Short Term Goal 3 (Week 1): Pt will move supine<>sit w/ SBA PT Short Term Goal 3 - Progress (Week 1): Met PT  Short Term Goal 4 (Week 1): Pt will transfer bed<>w/c w/ MinA consistently PT Short Term Goal 4 - Progress (Week 1): Met PT Short Term Goal 5 (Week 1): Pt will ambulate 10' w/ LRAD and ModA PT Short Term Goal 5 - Progress (Week 1): Met Week 2:  PT Short Term Goal 1 (Week 2): STGs=LTGs due to anticipated LOS  Skilled Therapeutic Interventions/Progress Updates:  1:1. Pt received semi-reclined in bed, reported not feeling well this morning due to nausea and dizziness with positional changes. RN at bedside. Pt agreeable to participate as able. Focus this session on activity tolerance, functional transfers and mobility in room and therex. Pt req supervision for t/f sup>sit EOB. Pt stated interest in eating magic cup instead of breakfast at this time, taking a few bites sitting EOB with supervision and cues for swallowing per SLP recommendation sheet. Pt req min A for ambulation in/out of bathroom and multiple t/f sit<>stand from bed, toilet and w/c. Pt continent of bladder with good tolerance to standing approx. 27mn during total A for clean up and clothing management.   Pt transported to/from gym via w/c for energy conservation. Pt utilized NuStep to target B UE/LE strength and endurance, level 2 for 448m, 14m59m2 with fair tolerance. Cues to keep pace in 40s-50s for increased endurance.   Pt reported feeling slightly better at end of session. SaO2 >96% on RA during session. Pt left sitting in w/c at end of session w/ all needs in reach and quick release belt in place.    Therapy Documentation Precautions:  Precautions Precautions: Fall Precaution Comments: monitor O2 and HR Restrictions Weight Bearing  Restrictions: No Pain: Pain Assessment Pain Assessment: No/denies pain  See FIM for current functional status  Therapy/Group: Individual Therapy  Gilmore Laroche 06/29/2014, 12:46 PM

## 2014-06-29 NOTE — Progress Notes (Signed)
The skilled treatment note has been reviewed and SLP is in agreement. Javares Kaufhold, M.A., CCC-SLP [phone]  

## 2014-06-29 NOTE — Progress Notes (Signed)
Occupational Therapy Session Notes  Patient Details  Name: Chad Morse MRN: [id number] Date of Birth: 1941/08/17  Today's Date: 06/29/2014 OT Individual Time: 1110-1155 and 240-330 OT Individual Time Calculation (min): 45 min  and 50 min  Short Term Goals: Week 1:  OT Short Term Goal 1 (Week 1): Pt will complete bathing with min assist utilizing AE as needed OT Short Term Goal 2 (Week 1): Pt will complete LB dressing with max assist utilizing AE as needed OT Short Term Goal 3 (Week 1): Pt will complete toilet transfer with mod assist OT Short Term Goal 4 (Week 1): Pt will complete 2/3 toileting steps  Skilled Therapeutic Interventions/Progress Updates:  1)  Patient resting in w/c upon arrival.  Engaged in self care retraining to include sponge bath, dress, toilet and groom.  Focused session on activity tolerance, sit><stand, standing tolerance, dynamic standing balance, toilet transfers and toileting.  Patient with small smear in adult brief during bath and patient reported he would like to sit on the commode to void and have a BM.  Patient attempted to perform hygiene while seated on commode (his method PTA) yet unsucceessful and required assistance to complete the task and assistance to don brief while patient standing with RW.   Due to decreased endurance and fatigue, patient requested that w/c be brought into the bathroom instead of ambulated out of the bathroom and performed oral care seated.  2)  Patient resting in w/c upon arrival with wife at his side. Engaged in therapeutic activity to include NuStep for 3 min at workload #4 (patient request to complete this exercise task), address endurance, strength, sit><stands, dynamic balance and review of tub/shower set up at home.  This OT recommends that the shower doors be removed and tub bench used to improve safety and conserve energy.  Patient and wife prefer to try using the shower seat that they currently have and he holds onto the  door frame at top to stabilize while he enters and exits the tub/shower.  Strongly discouraged this technique then they decided that they might install a grab bar he can hold onto to enter and exit.   Therapy Documentation Precautions:  Precautions Precautions: Fall Precaution Comments: monitor O2 and HR Restrictions Weight Bearing Restrictions: No Pain: Denies pain in both sessions ADL: See FIM for current functional status  Therapy/Group: Individual Therapy both sessions  [place], [place] 06/29/2014, 12:03 PM

## 2014-06-29 NOTE — Progress Notes (Signed)
[place] PHYSICAL MEDICINE & REHABILITATION     PROGRESS NOTE   73 y.o. right-handed male with history of COPD, atrial fibrillation on Eliquis and remote seizure disorder. Patient on no anti-seizure medication prior to admission. Patient lives with his wife uses a rolling walker prior to admission. Admitted 06/11/2014 after a fall at home when standing from a sitting position and struck his head. There was reports of brief unresponsiveness. He was initially conversant in the ED and had a seizure requiring intubation for airway protection. Noted calcium level 6.1 and magnesium level 0.6 on admission. Cranial CT scan negative for acute abnormalities and followup MRI negative for acute changes. CT angiogram chest negative. Patient initially loaded with Keppra later discontinued secondary to agitation felt this was possibly contributing to his restlessness. No further seizure activity noted and currently on no anti-seizure medication. Suspect hypomagnesemia as well as hypocalcemia possibly related to his seizures. EEG with moderate diffuse swelling no seizure activity noted generalized cerebral dysfunction suspect encephalopathy.   Subjective/Complaints: Pt asking about zanaflex and baclofen, used to take but denies spasms A  review of systems has been performed and if not noted above is otherwise negative.   Objective: Vital Signs: Blood pressure 106/64, pulse 98, temperature 98.6 F (37 C), temperature source Oral, resp. rate 18, weight 87.1 kg (192 lb 0.3 oz), SpO2 96.00%. No results found.  Recent Labs  06/27/14 0450  WBC 14.4*  HGB 14.5  HCT 43.4  PLT 224    Recent Labs  06/27/14 0450 06/29/14 0609  NA 137 139  K 4.1 4.0  CL 98 94*  GLUCOSE 133* 141*  BUN 14 15  CREATININE 0.60 0.64  CALCIUM 8.3* 9.1   CBG (last 3)   Recent Labs  06/28/14 2110 06/29/14 0704 06/29/14 1146  GLUCAP 156* 136* 112*    Wt Readings from Last 3 Encounters:  06/22/14 87.1 kg (192 lb 0.3  oz)  06/21/14 87.952 kg (193 lb 14.4 oz)  05/24/14 92.987 kg (205 lb)    Physical Exam:  Constitutional: He appears well-developed.  HENT: oral mucosa moist  Head: Normocephalic.  Eyes: EOM are normal.  Neck: Normal range of motion. Neck supple. No thyromegaly present.  Cardiovascular:  Cardiac rate controlled. No murmur or rubs. Reg rhythm.  Respiratory: Effort normal and breath sounds normal. No respiratory distress. No wheezes  GI: Soft. Bowel sounds are normal. He exhibits no distension.  Neurological: He is alert and generally appropriate. Fair insight and awareness. .   motor strength is 4/5 bilateral deltoid, bicep, tricep, grip  3/5 bilaterally hip flexors, 4- knee extensors, 4 ankle dorsiflexor plantar flexor  Sensation reduced to light touch bilateral lower extremities below the knees. DTR's 1+  Musculoskeletal: trace LE edema bilaterally unchanged Skin: numerous small abrasions on legs. Small area of breakdown on the left lateral malleolus  Psych: generally pleasant and cooperative.      Assessment/Plan: 1. Functional deficits secondary to debilitation related to seizures and multiple medical issues, associated encephalopathy vs mild TBI  which require 3+ hours per day of interdisciplinary therapy in a comprehensive inpatient rehab setting. Physiatrist is providing close team supervision and 24 hour management of active medical problems listed below. Physiatrist and rehab team continue to assess barriers to discharge/monitor patient progress toward functional and medical goals.  FIM: FIM - Bathing Bathing Steps Patient Completed: Chest;Right Arm;Left Arm;Abdomen;Front perineal area;Right upper leg;Left upper leg;Buttocks Bathing: 4: Min-Patient completes 8-9 45f 10 parts or 75+ percent  FIM - Upper Body  Dressing/Undressing Upper body dressing/undressing steps patient completed: Thread/unthread right sleeve of pullover shirt/dresss;Thread/unthread left sleeve of pullover  shirt/dress;Put head through opening of pull over shirt/dress;Pull shirt over trunk Upper body dressing/undressing: 5: Supervision: Safety issues/verbal cues FIM - Lower Body Dressing/Undressing Lower body dressing/undressing steps patient completed: Thread/unthread right pants leg;Thread/unthread left pants leg;Pull pants up/down Lower body dressing/undressing: 2: Max-Patient completed 25-49% of tasks  FIM - Toileting Toileting steps completed by patient: Adjust clothing prior to toileting;Adjust clothing after toileting Toileting Assistive Devices: Grab bar or rail for support Toileting: 3: Mod-Patient completed 2 of 3 steps  FIM - Radio producer Devices: Mining engineer Transfers: 4-To toilet/BSC: Min A (steadying Pt. > 75%);4-From toilet/BSC: Min A (steadying Pt. > 75%)  FIM - Control and instrumentation engineer Devices: Walker;Arm rests Bed/Chair Transfer: 4: Bed > Chair or W/C: Min A (steadying Pt. > 75%);4: Chair or W/C > Bed: Min A (steadying Pt. > 75%)  FIM - Locomotion: Wheelchair Distance: 100 Locomotion: Wheelchair: 1: Total Assistance/staff pushes wheelchair (Pt<25%) FIM - Locomotion: Ambulation Locomotion: Ambulation Assistive Devices: Administrator Ambulation/Gait Assistance: 4: Min assist Locomotion: Ambulation: 1: Travels less than 50 ft with minimal assistance (Pt.>75%)  Comprehension Comprehension Mode: Auditory Comprehension: 5-Understands basic 90% of the time/requires cueing < 10% of the time  Expression Expression Mode: Verbal Expression: 5-Expresses basic 90% of the time/requires cueing < 10% of the time.  Social Interaction Social Interaction: 5-Interacts appropriately 90% of the time - Needs monitoring or encouragement for participation or interaction.  Problem Solving Problem Solving: 3-Solves basic 50 - 74% of the time/requires cueing 25 - 49% of the time  Memory Memory: 3-Recognizes or recalls  50 - 74% of the time/requires cueing 25 - 49% of the time  Medical Problem List and Plan:  1. Functional deficits secondary to debilitation after seizures, multiple medical issues and associated encephalopathy 2. DVT Prophylaxis/Anticoagulation: Eliquis.  3. Pain Management: Tylenol as needed  4. Dysphagia. Dysphagia 1 pudding thick liquids. Continue IVF at HS 5. Neuropsych: This patient is not capable of making decisions on his own behalf.  6. Skin/Wound Care: Routine skin checks  7. Fluids/Electrolytes/Nutrition: k+ replacement.  recheck k+, mg++, and ca++ tomorrow 8. Atrial fibrillation. Continue Cardizem 60 mg 4 times a day, Lopressor 12.5 mg twice a day. Cardiac rate generally controlled      9. Diabetes mellitus.  . Sliding scale insulin. Patient on Glucophage 500 mg twice a day prior to admission. Began metformin 250mg  bid which seems to have helped---but may be causing diarrhea  -begin amaryl in the morning 10. Tobacco abuse /COPD. DuoNeb every 6 hours, Albuterol nebulizer as needed. Check oxygen saturations every shift. Patient on Spiriva 18 mcg daily prior to admission. Provide counseling for tobacco abuse  11. Hyperlipidemia. Lipitor  12. Loose stool: likely related to IBS   -encourage appropriate diet  -probiotic added  -dc metformin, start amaryl    13. Hypokalemia: supplemented. Improved today 14. Leukocytosis: continues to normalize.  -afebrile currently and no active signs of infection  LOS (Days) 8 A FACE TO FACE EVALUATION WAS PERFORMED  Alysia Penna E 06/29/2014 2:23 PM

## 2014-06-30 ENCOUNTER — Inpatient Hospital Stay (HOSPITAL_COMMUNITY): Payer: Medicare Other | Admitting: Physical Therapy

## 2014-06-30 ENCOUNTER — Inpatient Hospital Stay (HOSPITAL_COMMUNITY): Payer: Medicare Other

## 2014-06-30 ENCOUNTER — Encounter (HOSPITAL_COMMUNITY): Payer: Medicare Other

## 2014-06-30 ENCOUNTER — Inpatient Hospital Stay (HOSPITAL_COMMUNITY): Payer: Medicare Other | Admitting: Speech Pathology

## 2014-06-30 DIAGNOSIS — D62 Acute posthemorrhagic anemia: Secondary | ICD-10-CM

## 2014-06-30 DIAGNOSIS — R5381 Other malaise: Secondary | ICD-10-CM

## 2014-06-30 DIAGNOSIS — I4891 Unspecified atrial fibrillation: Secondary | ICD-10-CM

## 2014-06-30 LAB — CBC
HCT: 34.9 % — ABNORMAL LOW (ref 39.0–52.0)
HCT: 34 % — ABNORMAL LOW (ref 39.0–52.0)
HEMATOCRIT: 29.6 % — AB (ref 39.0–52.0)
HEMOGLOBIN: 11.6 g/dL — AB (ref 13.0–17.0)
HEMOGLOBIN: 10 g/dL — AB (ref 13.0–17.0)
Hemoglobin: 11.4 g/dL — ABNORMAL LOW (ref 13.0–17.0)
MCH: 30.6 pg (ref 26.0–34.0)
MCH: 30 pg (ref 26.0–34.0)
MCH: 30.1 pg (ref 26.0–34.0)
MCHC: 33.2 g/dL (ref 30.0–36.0)
MCHC: 33.5 g/dL (ref 30.0–36.0)
MCHC: 33.8 g/dL (ref 30.0–36.0)
MCV: 92.1 fL (ref 78.0–100.0)
MCV: 89.5 fL (ref 78.0–100.0)
MCV: 89.2 fL (ref 78.0–100.0)
Platelets: 335 10*3/uL (ref 150–400)
Platelets: 349 10*3/uL (ref 150–400)
Platelets: 307 10*3/uL (ref 150–400)
RBC: 3.79 MIL/uL — ABNORMAL LOW (ref 4.22–5.81)
RBC: 3.8 MIL/uL — AB (ref 4.22–5.81)
RBC: 3.32 MIL/uL — ABNORMAL LOW (ref 4.22–5.81)
RDW: 14.8 % (ref 11.5–15.5)
RDW: 14.9 % (ref 11.5–15.5)
RDW: 14.8 % (ref 11.5–15.5)
WBC: 24.4 10*3/uL — AB (ref 4.0–10.5)
WBC: 24.7 10*3/uL — AB (ref 4.0–10.5)
WBC: 21.8 10*3/uL — ABNORMAL HIGH (ref 4.0–10.5)

## 2014-06-30 LAB — URINALYSIS, ROUTINE W REFLEX MICROSCOPIC
Bilirubin Urine: NEGATIVE
Glucose, UA: NEGATIVE mg/dL
Hgb urine dipstick: NEGATIVE
Ketones, ur: NEGATIVE mg/dL
Leukocytes, UA: NEGATIVE
Nitrite: NEGATIVE
Protein, ur: NEGATIVE mg/dL
Specific Gravity, Urine: 1.023 (ref 1.005–1.030)
Urobilinogen, UA: 1 mg/dL (ref 0.0–1.0)
pH: 5 (ref 5.0–8.0)

## 2014-06-30 LAB — CBC WITH DIFFERENTIAL/PLATELET
Basophils Absolute: 0.1 10*3/uL (ref 0.0–0.1)
Basophils Relative: 0 % (ref 0–1)
Eosinophils Absolute: 0.1 10*3/uL (ref 0.0–0.7)
Eosinophils Relative: 0 % (ref 0–5)
HCT: 38 % — ABNORMAL LOW (ref 39.0–52.0)
Hemoglobin: 12.7 g/dL — ABNORMAL LOW (ref 13.0–17.0)
Lymphocytes Relative: 11 % — ABNORMAL LOW (ref 12–46)
Lymphs Abs: 2.4 10*3/uL (ref 0.7–4.0)
MCH: 30 pg (ref 26.0–34.0)
MCHC: 33.4 g/dL (ref 30.0–36.0)
MCV: 89.6 fL (ref 78.0–100.0)
Monocytes Absolute: 2 10*3/uL — ABNORMAL HIGH (ref 0.1–1.0)
Monocytes Relative: 9 % (ref 3–12)
Neutro Abs: 17.9 10*3/uL — ABNORMAL HIGH (ref 1.7–7.7)
Neutrophils Relative %: 80 % — ABNORMAL HIGH (ref 43–77)
Platelets: 337 10*3/uL (ref 150–400)
RBC: 4.24 MIL/uL (ref 4.22–5.81)
RDW: 14.7 % (ref 11.5–15.5)
WBC: 22.5 10*3/uL — ABNORMAL HIGH (ref 4.0–10.5)

## 2014-06-30 LAB — GLUCOSE, CAPILLARY
Glucose-Capillary: 132 mg/dL — ABNORMAL HIGH (ref 70–99)
Glucose-Capillary: 148 mg/dL — ABNORMAL HIGH (ref 70–99)
Glucose-Capillary: 154 mg/dL — ABNORMAL HIGH (ref 70–99)
Glucose-Capillary: 160 mg/dL — ABNORMAL HIGH (ref 70–99)

## 2014-06-30 LAB — OCCULT BLOOD X 1 CARD TO LAB, STOOL
FECAL OCCULT BLD: POSITIVE — AB
Fecal Occult Bld: POSITIVE — AB
Fecal Occult Bld: POSITIVE — AB

## 2014-06-30 MED ORDER — DM-GUAIFENESIN ER 30-600 MG PO TB12
1.0000 | ORAL_TABLET | Freq: Two times a day (BID) | ORAL | Status: DC
  Administered 2014-06-30 – 2014-07-01 (×3): 1 via ORAL
  Filled 2014-06-30 (×6): qty 1

## 2014-06-30 MED ORDER — ALBUTEROL SULFATE (2.5 MG/3ML) 0.083% IN NEBU
2.5000 mg | INHALATION_SOLUTION | Freq: Four times a day (QID) | RESPIRATORY_TRACT | Status: DC
Start: 2014-06-30 — End: 2014-07-01
  Administered 2014-06-30 – 2014-07-01 (×5): 2.5 mg via RESPIRATORY_TRACT
  Filled 2014-06-30 (×6): qty 3

## 2014-06-30 MED ORDER — POLYETHYLENE GLYCOL 3350 17 G PO PACK
17.0000 g | PACK | Freq: Two times a day (BID) | ORAL | Status: DC
  Administered 2014-06-30 – 2014-07-01 (×3): 17 g via ORAL
  Filled 2014-06-30 (×5): qty 1

## 2014-06-30 NOTE — Progress Notes (Signed)
Occupational Therapy Weekly Progress Note  Patient Details  Name: Chad Morse MRN: [id number] Date of Birth: January 05, 1941  Beginning of progress report period: June 22, 2014 End of progress report period: June 30, 2014  Patient has met 4 of 4 short term goals.Pt has made steady progress with BADLs including bathing and dressing with sit<>stand from w/c, functional transfers, and standing balance.  Pt continues to require min verbal cues for safety awareness.  Pt continues to require extra time to complete tasks and multiple rest breaks during sessions.  Wife is supportive and has been present for several therapy sessions since admission-generally in the   Patient continues to demonstrate the following deficits: muscle weakness, decreased cardiorespiratoy endurance and decreased oxygen support (recently weaned form O2) and decreased standing balance.  Therefore, patient will continue to benefit from skilled OT intervention to enhance overall performance with BADL and Reduce care partner burden.  Patient progressing toward long term goals..  Continue plan of care.  OT Short Term Goals Week 1:  OT Short Term Goal 1 (Week 1): Pt will complete bathing with min assist utilizing AE as needed OT Short Term Goal 1 - Progress (Week 1): Met OT Short Term Goal 2 (Week 1): Pt will complete LB dressing with max assist utilizing AE as needed OT Short Term Goal 2 - Progress (Week 1): Met OT Short Term Goal 3 (Week 1): Pt will complete toilet transfer with mod assist OT Short Term Goal 3 - Progress (Week 1): Met OT Short Term Goal 4 (Week 1): Pt will complete 2/3 toileting steps OT Short Term Goal 4 - Progress (Week 1): Met Week 2:  OT Short Term Goal 1 (Week 2): Patient will perform toilet transfers with supervision OT Short Term Goal 2 (Week 2): Pt will perform toileting tasks with supervision OT Short Term Goal 3 (Week 2): Pt will perform LB dressing tasks with mod A OT Short Term Goal 4 (Week  2): Pt will perform bathing tasks with supervions using AE PRN  Therapy Documentation Precautions:  Precautions Precautions: Fall Precaution Comments: monitor O2 and HR Restrictions Weight Bearing Restrictions: No Pain: Pain Assessment Pain Assessment: No/denies pain  See FIM for current functional status   Leroy Libman 06/30/2014, 3:08 PM

## 2014-06-30 NOTE — Progress Notes (Signed)
Physical Therapy Session Note  Patient Details  Name: Chad Morse MRN: [id number] Date of Birth: 06/08/1941  Today's Date: 06/30/2014 PT Individual Time: 1530-1550 PT Individual Time Calculation (min): 20 min   Short Term Goals: Week 2:  PT Short Term Goal 1 (Week 2): STGs=LTGs due to anticipated LOS  Skilled Therapeutic Interventions/Progress Updates:   Pt received sitting in w/c, reporting feeling ill with multiple incontinent episodes of bloody stool today but agreeable to attempt therapy. Patient transported in w/c for energy conservation. In gym, pt reported feeling woozy. Seated BP 84/49 and after short rest, seated BP 90/55, 96% Sp02. Patient returned to room where he was noted to have incontinent episode. Pt performed sit <> stand from w/c using RW and able to maintain standing x 5 min with min guard for hygiene before requesting to sit. Pt left in care of nursing staff. Pt missed 40 minutes of skilled therapy. Will f/u as able.  Therapy Documentation Precautions:  Precautions Precautions: Fall Precaution Comments: monitor O2 and HR Restrictions Weight Bearing Restrictions: No General: PT Amount of Missed Time (min): 40 Minutes PT Missed Treatment Reason: Bowel/bladder accident;Patient ill (Comment);Nursing care (low BP, bloody stool) Vital Signs: BP: 84/49 mmHg Patient Position (if appropriate): Sitting Oxygen Therapy SpO2: 96 % O2 Device: None (Room air) Pain: Pain Assessment Pain Assessment: No/denies pain  See FIM for current functional status  Therapy/Group: Individual Therapy  Laretta Alstrom 06/30/2014, 4:06 PM

## 2014-06-30 NOTE — Progress Notes (Signed)
Patient passed mutplie blood clots from rectum which amounted to medium size stool. P on called notified orders given to obtain CBC. Patient alert and orient, stable no sign of distress,CBC 14.4 to 12.7 Sylvester Harder PA notifed orders given obtain hemocult x3

## 2014-06-30 NOTE — Progress Notes (Signed)
Occupational Therapy Note  Patient Details  Name: Chad Morse MRN: [id number] Date of Birth: 1941-04-08  Pt missed 60 mins skilled OT services.  Pt in bed upon arrival and stated that he "couldn't do anything" because he was "wiped out."  Encouraged patient to participate in bathing and dressing tasks.  Pt continued to decline.   Leotis Shames [facility] 06/30/2014, 11:56 AM

## 2014-06-30 NOTE — Progress Notes (Signed)
Nursing reports numerous bloody stools last evening. Patient and nonspecific abdominal discomfort no vomiting. Elevated white blood cell count of 22,000. Followup hemoglobin 12 7 06/30/2014 mildly decreased from 14.5 06/27/2014. Patient has seen Dr. Melony Overly in the past at [facility]  gastroenterology [phone]. Will consult gastroenterology at [facility] for further recommendations regarding bloody stools

## 2014-06-30 NOTE — Progress Notes (Signed)
The weekly progress and skilled treatment notes have been reviewed and SLP is in agreement. Gunnar Fusi, M.A., CCC-SLP [phone]

## 2014-06-30 NOTE — Progress Notes (Signed)
[place] PHYSICAL MEDICINE & REHABILITATION     PROGRESS NOTE   73 y.o. right-handed male with history of COPD, atrial fibrillation on Eliquis and remote seizure disorder. Patient on no anti-seizure medication prior to admission. Patient lives with his wife uses a rolling walker prior to admission. Admitted 06/11/2014 after a fall at home when standing from a sitting position and struck his head. There was reports of brief unresponsiveness. He was initially conversant in the ED and had a seizure requiring intubation for airway protection. Noted calcium level 6.1 and magnesium level 0.6 on admission. Cranial CT scan negative for acute abnormalities and followup MRI negative for acute changes. CT angiogram chest negative. Patient initially loaded with Keppra later discontinued secondary to agitation felt this was possibly contributing to his restlessness. No further seizure activity noted and currently on no anti-seizure medication. Suspect hypomagnesemia as well as hypocalcemia possibly related to his seizures. EEG with moderate diffuse swelling no seizure activity noted generalized cerebral dysfunction suspect encephalopathy.   Subjective/Complaints: Pt asking about zanaflex and baclofen, used to take but denies spasms Increased coughing after MBS today BRBPR yest, now guaic pos dk brown stool D/w GI consultant A  review of systems has been performed and if not noted above is otherwise negative.   Objective: Vital Signs: Blood pressure 106/72, pulse 102, temperature 97.7 F (36.5 C), temperature source Oral, resp. rate 18, weight 89.54 kg (197 lb 6.4 oz), SpO2 97.00%. Dg Chest 2 View  06/30/2014   CLINICAL DATA:  COPD with acute onset of difficulty breathing  EXAM: CHEST  2 VIEW  COMPARISON:  June 17, 2014  FINDINGS: There is underlying emphysematous change. There is no apparent edema or consolidation. The heart size is within normal limits. Pulmonary vascularity is within normal limits. No  adenopathy. There is degenerative change in the thoracic spine.  On the lateral view, the stomach appears distended with fluid and air.  IMPRESSION: Underlying emphysema.  No edema or consolidation.  On lateral view, stomach appears distended with fluid and air. Question a degree of underlying ileus. This finding may warrant abdominal radiographs for further assessment.   Electronically Signed   By: Lowella Grip M.D.   On: 06/30/2014 09:30    Recent Labs  06/30/14 0445  WBC 22.5*  HGB 12.7*  HCT 38.0*  PLT 337    Recent Labs  06/29/14 0609  NA 139  K 4.0  CL 94*  GLUCOSE 141*  BUN 15  CREATININE 0.64  CALCIUM 9.1   CBG (last 3)   Recent Labs  06/29/14 1648 06/29/14 2040 06/30/14 0741  GLUCAP 78 100* 132*    Wt Readings from Last 3 Encounters:  06/30/14 89.54 kg (197 lb 6.4 oz)  06/21/14 87.952 kg (193 lb 14.4 oz)  05/24/14 92.987 kg (205 lb)    Physical Exam:  Constitutional: He appears well-developed.  HENT: oral mucosa moist  Head: Normocephalic.  Eyes: EOM are normal.  Neck: Normal range of motion. Neck supple. No thyromegaly present.  Cardiovascular:  Cardiac rate controlled. No murmur or rubs. Reg rhythm.  Respiratory: Effort normal and breath sounds normal. No respiratory distress.Diffuse upper airway sounds GI: Soft. Bowel sounds are normal. He exhibits no distension.  Neurological: He is alert and generally appropriate. Fair insight and awareness. .   motor strength is 4/5 bilateral deltoid, bicep, tricep, grip  3/5 bilaterally hip flexors, 4- knee extensors, 4 ankle dorsiflexor plantar flexor  Sensation reduced to light touch bilateral lower extremities below the knees.  DTR's 1+  Musculoskeletal: trace LE edema bilaterally unchanged Skin: numerous small abrasions on legs. Small area of breakdown on the left lateral malleolus  Psych: generally pleasant and cooperative.      Assessment/Plan: 1. Functional deficits secondary to debilitation  related to seizures and multiple medical issues, associated encephalopathy vs mild TBI  which require 3+ hours per day of interdisciplinary therapy in a comprehensive inpatient rehab setting. Physiatrist is providing close team supervision and 24 hour management of active medical problems listed below. Physiatrist and rehab team continue to assess barriers to discharge/monitor patient progress toward functional and medical goals.  FIM: FIM - Bathing Bathing Steps Patient Completed: Chest;Right Arm;Left Arm;Abdomen;Front perineal area;Right upper leg;Left upper leg;Buttocks Bathing: 4: Min-Patient completes 8-9 33f 10 parts or 75+ percent  FIM - Upper Body Dressing/Undressing Upper body dressing/undressing steps patient completed: Thread/unthread right sleeve of pullover shirt/dresss;Thread/unthread left sleeve of pullover shirt/dress;Put head through opening of pull over shirt/dress;Pull shirt over trunk Upper body dressing/undressing: 5: Supervision: Safety issues/verbal cues FIM - Lower Body Dressing/Undressing Lower body dressing/undressing steps patient completed:  (declined to fully remove pants to bathe) Lower body dressing/undressing: 0: Activity did not occur  FIM - Toileting Toileting steps completed by patient: Adjust clothing after toileting;Adjust clothing prior to toileting Toileting Assistive Devices: Grab bar or rail for support Toileting: 3: Mod-Patient completed 2 of 3 steps  FIM - Radio producer Devices: Walker;Grab bars;Elevated toilet seat Toilet Transfers: 4-To toilet/BSC: Min A (steadying Pt. > 75%);4-From toilet/BSC: Min A (steadying Pt. > 75%)  FIM - Control and instrumentation engineer Devices: Walker;Arm rests Bed/Chair Transfer: 4: Bed > Chair or W/C: Min A (steadying Pt. > 75%);4: Chair or W/C > Bed: Min A (steadying Pt. > 75%)  FIM - Locomotion: Wheelchair Distance: 100 Locomotion: Wheelchair: 1: Total Assistance/staff  pushes wheelchair (Pt<25%) FIM - Locomotion: Ambulation Locomotion: Ambulation Assistive Devices: Administrator Ambulation/Gait Assistance: 4: Min assist Locomotion: Ambulation: 1: Travels less than 50 ft with minimal assistance (Pt.>75%)  Comprehension Comprehension Mode: Auditory Comprehension: 5-Understands basic 90% of the time/requires cueing < 10% of the time  Expression Expression Mode: Verbal Expression: 5-Expresses basic 90% of the time/requires cueing < 10% of the time.  Social Interaction Social Interaction: 5-Interacts appropriately 90% of the time - Needs monitoring or encouragement for participation or interaction.  Problem Solving Problem Solving: 3-Solves basic 50 - 74% of the time/requires cueing 25 - 49% of the time  Memory Memory: 3-Recognizes or recalls 50 - 74% of the time/requires cueing 25 - 49% of the time  Medical Problem List and Plan:  1. Functional deficits secondary to debilitation after seizures, multiple medical issues and associated encephalopathy 2. DVT Prophylaxis/Anticoagulation: Eliquis.  3. Pain Management: Tylenol as needed  4. Dysphagia. Dysphagia 1 pudding thick liquids. Continue IVF at HS 5. Neuropsych: This patient is not capable of making decisions on his own behalf.  6. Skin/Wound Care: Routine skin checks  7. Fluids/Electrolytes/Nutrition: k+ replacement.  recheck k+, mg++, and ca++ tomorrow 8. Atrial fibrillation. Continue Cardizem 60 mg 4 times a day, Lopressor 12.5 mg twice a day. Cardiac rate generally controlled      9. Diabetes mellitus.  . Sliding scale insulin. Patient on Glucophage 500 mg twice a day prior to admission. Began metformin 250mg  bid which seems to have helped---but may be causing diarrhea  -begin amaryl in the morning 10. Tobacco abuse /COPD. DuoNeb every 6 hours, Albuterol nebulizer as needed. Check oxygen saturations every shift. Patient on  Spiriva 18 mcg daily prior to admission. Provide counseling for tobacco  abuse  11. Hyperlipidemia. Lipitor  12. Loose stool: likely related to IBS   -encourage appropriate diet  -probiotic added  -dc metformin, start amaryl    13. Hypokalemia: supplemented. Improved today 14. Leukocytosis: continues to normalize.  -afebrile currently and no active signs of infection 15.  Emphysema, sounds like bronchitis curently, CXR without evidence of PNA, start mucolytic , suppresant and inhaler  LOS (Days) 9 A FACE TO FACE EVALUATION WAS PERFORMED  Chad Morse 06/30/2014 10:59 AM

## 2014-06-30 NOTE — Plan of Care (Signed)
Problem: RH BLADDER ELIMINATION Goal: RH STG MANAGE BLADDER WITH ASSISTANCE STG Manage Bladder With min Assistance  Outcome: Not Progressing Requires brief 2/2 incont at times and staff have to place and hold urinal

## 2014-06-30 NOTE — Progress Notes (Signed)
Speech Language Pathology Weekly Progress and Session Note  Patient Details  Name: Chad Morse MRN: [id number] Date of Birth: Mar 11, 1941  Beginning of progress report period: June 22, 2014 End of progress report period: June 30, 2014  Today's Date: 06/30/2014 SLP Individual Time: 1035-1105 SLP Individual Calculated Time (min): 30 min  Short Term Goals: Week 1: SLP Short Term Goal 1 (Week 1): Patient will utilize external visual aids to recall new, daily information with Max A multimodal cues.   SLP Short Term Goal 1 - Progress (Week 1): Met SLP Short Term Goal 2 (Week 1): Patient will demonstrate functional problem solving for basic and familiar tasks with Mod A multimodal cues. SLP Short Term Goal 2 - Progress (Week 1): Met SLP Short Term Goal 3 (Week 1): Patient will sustain attention to basic and familiar tasks for 5 minutes with Min A multimodal.  SLP Short Term Goal 3 - Progress (Week 1): Met SLP Short Term Goal 4 (Week 1): Patient will identify 1 cognitive and 1 physical deficit with Max A multimodal cues.  SLP Short Term Goal 4 - Progress (Week 1): Met SLP Short Term Goal 5 (Week 1): Patient will utilize swallowing compensatory strategies with least restrictive diet to minimize overt s/s of aspiration with Mod  A multimodal cues.  SLP Short Term Goal 5 - Progress (Week 1): Not met SLP Short Term Goal 6 (Week 1): Patient will orient to time, place, person and situation with Mod A multimodal cues.  SLP Short Term Goal 6 - Progress (Week 1): Met    New Short Term Goals: Week 2: SLP Short Term Goal 1 (Week 2): Patient will utilize external visual aids to recall new, daily information with Mod A multimodal cues.   SLP Short Term Goal 2 (Week 2): Patient will demonstrate functional problem solving for basic and familiar tasks with Min A multimodal cues. SLP Short Term Goal 3 (Week 2): Patient will sustain attention to basic and familiar tasks for 10 minutes with Min A  multimodal cues. SLP Short Term Goal 4 (Week 2): Patient will identify 1 cognitive and 1 physical deficit with Mod A multimodal cues.  SLP Short Term Goal 5 (Week 2): Patient will utilize swallowing compensatory strategies with least restrictive diet to minimize overt s/s of aspiration with Mod A multimodal cues.  SLP Short Term Goal 6 (Week 2): Patient will orient to time, place, person and situation with Min A multimodal cues.   Weekly Progress Updates: Patient has made functional gains and has met 5 out of 6 STG's this reporting period due to increased attention, awareness, orientation, problem solving and memory. Currently, patient continues to require Max A multimodal cues for utilization of swallowing compensatory strategies of multiple swallows and coughing to clear residuals. Patient was able to tolerate trials of honey-thick liquids with cueing with minimal overt s/s of aspiration, therefore, a MBS was administered on 06/30/14 to assess swallowing function. Recommend to upgrade diet to Dys. 2 textures with Honey-thick liquids and full supervision with cueing for utilization of swallowing compensatory strategies to minimize overt s/s of aspiration. Patient/family education ongoing and patient would benefit from continued skilled SLP intervention to maximize his cognitive-linguistic and swallowing function and overall functional independence prior to discharge.   Intensity: Minumum of 1-2 x/day, 30 to 90 minutes Frequency: 5 out of 7 days Duration/Length of Stay: 07-06-14 Treatment/Interventions: Cognitive remediation/compensation;Cueing hierarchy;Dysphagia/aspiration precaution training;Internal/external aids;Patient/family education;Functional tasks;Therapeutic Activities;Environmental controls   Daily Session  Skilled Therapeutic Interventions:  Skilled treatment  focused on dysphagia goals. Student facilitated session by providing Max A multimodal cues for recall of upgraded diet  recommendations from MBS conducted immediately prior to speech session. Student also facilitated session by providing Max A multimodal cues for utilization of swallowing compensatory strategy of two swallows after each bite during trials of Honey-thick liquids. Patient demonstrated decreased frustration tolerance with cueing of two swallows after each sip but was redirected with Min A multimodal cues. Patient also independently utilized schedule to recall and discuss previous, current, and future therapy sessions today. Continue with current plan of care.        FIM:  Comprehension Comprehension Mode: Auditory Comprehension: 4-Understands basic 75 - 89% of the time/requires cueing 10 - 24% of the time Expression Expression Mode: Verbal Expression: 5-Expresses basic 90% of the time/requires cueing < 10% of the time. Social Interaction Social Interaction: 5-Interacts appropriately 90% of the time - Needs monitoring or encouragement for participation or interaction. Problem Solving Problem Solving: 3-Solves basic 50 - 74% of the time/requires cueing 25 - 49% of the time Memory Memory: 3-Recognizes or recalls 50 - 74% of the time/requires cueing 25 - 49% of the time General    Pain Pain Assessment Pain Assessment: No/denies pain  Therapy/Group: Individual Therapy  Carlen Fils 06/30/2014, 4:59 PM

## 2014-06-30 NOTE — Consult Note (Signed)
[facility] Reason for consult: Lower G.I. Bleeding Referring Physician: Dr Letta Pate. PCP: Dr. Luan Pulling. Primary G.I.: Dr. Matthew Saras [name] is an 73 y.o. male.  HPI: He was admitted to the hospital with syncopal episodes at home 9/25. He apparently fell struck his head had difficulty breathing and was hypoxic. He was in the ICU for area that time and intubated airway protection. Transferred to rehab. Has a history of COPD and atrial fibrillation. Has been on Eliquis Is atrial for. He had some seizure issues. We're asked to see the patient because of some rectal bleeding. The nursing staff notes that he had maroon stool yesterday it was pudding consistency. It now is brown and heme positive. Had a modified barium swallow today. Denies abdominal pain. Patients hemoglobin was 19 in admission and has dropped 12.7 but he was felt to be dehydrated on admission. The patient is awake and alert and gives a very good history. 2011 he had colonoscopy in [place] by Dr. Matthew Saras with the removal of several polyps. 10 days later he was admitted to [facility] post polypectomy bleeding and colonoscopy here at that time revealed the bleeding polypectomy site which was treated locally with good results. The patient also had moderate diverticulosis. He was admitted to [facility] last year with a partial small bowel obstruction to resolve and develop G.I. bleeding. This was felt to be diverticular and quickly resolved. He denies any bleeding since that time but is still somewhat constipated.  Past Medical History  Diagnosis Date  . COPD (chronic obstructive pulmonary disease)   . Diabetes mellitus   . Atrial fibrillation   . Peripheral neuropathy   . Gallstones   . Peripheral vascular disease   . IBS (irritable bowel syndrome)   . Peripheral arterial disease   . Tobacco abuse   . Hypertension   . Hyperlipidemia     Past Surgical History  Procedure Laterality  Date  . Back surgery      History reviewed. No pertinent family history.  Social History:  reports that he has been smoking Cigarettes.  He has been smoking about 0.00 packs per day for the past 60 years. He has never used smokeless tobacco. He reports that he does not drink alcohol or use illicit drugs.  Allergies:  Allergies  Allergen Reactions  . Ciprofloxacin Hives  . Penicillins Hives  . Sulfonamide Derivatives Hives    Medications; Prior to Admission medications   Medication Sig Start Date End Date Taking? Authorizing Provider  allopurinol (ZYLOPRIM) 100 MG tablet Take 100 mg by mouth daily.    Historical Provider, MD  ALPRAZolam Duanne Moron) 1 MG tablet Take 1 mg by mouth daily as needed for anxiety or sleep.  12/25/12   Historical Provider, MD  apixaban (ELIQUIS) 5 MG TABS tablet Take 1 tablet (5 mg total) by mouth 2 (two) times daily. 03/28/14   Lorretta Harp, MD  baclofen (LIORESAL) 20 MG tablet Take 10-20 mg by mouth 2 (two) times daily. Take 10 mg morning, take 20 mg noon, 10 mg at 6 pm and 20 mg 11 pm.    Historical Provider, MD  cefUROXime (CEFTIN) 250 MG tablet Take 1 tablet (250 mg total) by mouth 2 (two) times daily with a meal. 05/24/14   Dorie Rank, MD  cilostazol (PLETAL) 50 MG tablet Take 50 mg by mouth 2 (two) times daily.  01/04/13   Historical Provider, MD  diltiazem (CARDIZEM CD) 120 MG 24 hr capsule Take 1  capsule (120 mg total) by mouth daily. 12/07/13   Lorretta Harp, MD  folic acid (FOLVITE) 1 MG tablet Take 1 mg by mouth daily.    Historical Provider, MD  Folic Acid-Vit S9-QZR A07 (FOLBEE) 2.5-25-1 MG TABS tablet Take 1 tablet by mouth daily. 11/16/13   Lorretta Harp, MD  furosemide (LASIX) 40 MG tablet Take 40 mg by mouth daily.    Historical Provider, MD  HYDROcodone-acetaminophen (NORCO) 10-325 MG per tablet Take 1 tablet by mouth 3 (three) times daily. For pain    Historical Provider, MD  KLOR-CON M20 20 MEQ tablet Take 20 mEq by mouth daily.  12/10/12    Historical Provider, MD  metFORMIN (GLUCOPHAGE) 500 MG tablet Take 500 mg by mouth 2 (two) times daily.  04/25/14   Historical Provider, MD  metoprolol (LOPRESSOR) 50 MG tablet Take 25 mg by mouth every evening.     Historical Provider, MD  mupirocin cream (BACTROBAN) 2 % Apply 1 application topically as needed (for skin irritation).  09/01/13   Historical Provider, MD  ondansetron (ZOFRAN) 4 MG tablet Take 1 tablet (4 mg total) by mouth every 6 (six) hours. 05/24/14   Dorie Rank, MD  pantoprazole (PROTONIX) 40 MG tablet Take 1 tablet (40 mg total) by mouth daily. 10/13/13   Lorretta Harp, MD  ramipril (ALTACE) 2.5 MG capsule Take 1 capsule (2.5 mg total) by mouth daily. 10/18/13   Lorretta Harp, MD  simvastatin (ZOCOR) 20 MG tablet Take 1 tablet (20 mg total) by mouth every evening. 11/24/13   Lorretta Harp, MD  SPIRIVA HANDIHALER 18 MCG inhalation capsule Place 18 mcg into inhaler and inhale daily as needed (for shortness of breath).     Historical Provider, MD  tiZANidine (ZANAFLEX) 4 MG tablet Take 2-4 mg by mouth 4 (four) times daily. 6 am- half tab 11 am- 1 tab 6 pm- half tab 11 pm- 2 tabs 07/06/13   Antony Contras, MD  traZODone (DESYREL) 50 MG tablet Take 100 mg by mouth at bedtime.  06/29/13   Historical Provider, MD  zolpidem (AMBIEN CR) 12.5 MG CR tablet Take 12.5 mg by mouth daily. 06/29/13   Historical Provider, MD   . albuterol  2.5 mg Nebulization QID  . antiseptic oral rinse  7 mL Mouth Rinse QID  . apixaban  5 mg Oral BID  . atorvastatin  10 mg Oral q1800  . chlorhexidine  15 mL Mouth Rinse BID  . dextromethorphan-guaiFENesin  1 tablet Oral BID  . diltiazem  60 mg Oral 4 times per day  . feeding supplement (ENSURE)  1 Container Oral Q1500  . furosemide  40 mg Oral Daily  . glimepiride  2 mg Oral Q breakfast  . insulin aspart  0-9 Units Subcutaneous TID WC  . metoprolol tartrate  12.5 mg Oral BID  . polyethylene glycol  17 g Oral BID  . potassium chloride  40 mEq Oral BID   . saccharomyces boulardii  250 mg Oral BID  . tiZANidine  4 mg Oral QHS   PRN Meds acetaminophen, bisacodyl, HYDROcodone-acetaminophen, ondansetron (ZOFRAN) IV, ondansetron, polyethylene glycol, RESOURCE THICKENUP CLEAR, sodium chloride, sorbitol, traZODone Results for orders placed during the hospital encounter of 06/21/14 (from the past 48 hour(s))  GLUCOSE, CAPILLARY     Status: Abnormal   Collection Time    06/28/14  4:58 PM      Result Value Ref Range   Glucose-Capillary 109 (*) 70 - 99 mg/dL  GLUCOSE, CAPILLARY  Status: Abnormal   Collection Time    06/28/14  9:10 PM      Result Value Ref Range   Glucose-Capillary 156 (*) 70 - 99 mg/dL  MAGNESIUM     Status: None   Collection Time    06/29/14  6:09 AM      Result Value Ref Range   Magnesium 1.6  1.5 - 2.5 mg/dL  COMPREHENSIVE METABOLIC PANEL     Status: Abnormal   Collection Time    06/29/14  6:09 AM      Result Value Ref Range   Sodium 139  137 - 147 mEq/Morse   Potassium 4.0  3.7 - 5.3 mEq/Morse   Chloride 94 (*) 96 - 112 mEq/Morse   CO2 33 (*) 19 - 32 mEq/Morse   Glucose, Bld 141 (*) 70 - 99 mg/dL   BUN 15  6 - 23 mg/dL   Creatinine, Ser 0.64  0.50 - 1.35 mg/dL   Calcium 9.1  8.4 - 10.5 mg/dL   Total Protein 6.0  6.0 - 8.3 g/dL   Albumin 2.8 (*) 3.5 - 5.2 g/dL   AST 51 (*) 0 - 37 U/Morse   Comment: HEMOLYSIS AT THIS LEVEL MAY AFFECT RESULT   ALT 95 (*) 0 - 53 U/Morse   Alkaline Phosphatase 105  39 - 117 U/Morse   Total Bilirubin 0.5  0.3 - 1.2 mg/dL   GFR calc non Af Amer >90  >90 mL/min   GFR calc Af Amer >90  >90 mL/min   Comment: (NOTE)     The eGFR has been calculated using the CKD EPI equation.     This calculation has not been validated in all clinical situations.     eGFR's persistently <90 mL/min signify possible Chronic Kidney     Disease.   Anion gap 12  5 - 15  GLUCOSE, CAPILLARY     Status: Abnormal   Collection Time    06/29/14  7:04 AM      Result Value Ref Range   Glucose-Capillary 136 (*) 70 - 99 mg/dL  GLUCOSE,  CAPILLARY     Status: Abnormal   Collection Time    06/29/14 11:46 AM      Result Value Ref Range   Glucose-Capillary 112 (*) 70 - 99 mg/dL  GLUCOSE, CAPILLARY     Status: None   Collection Time    06/29/14  4:48 PM      Result Value Ref Range   Glucose-Capillary 78  70 - 99 mg/dL  GLUCOSE, CAPILLARY     Status: Abnormal   Collection Time    06/29/14  8:40 PM      Result Value Ref Range   Glucose-Capillary 100 (*) 70 - 99 mg/dL  CBC WITH DIFFERENTIAL     Status: Abnormal   Collection Time    06/30/14  4:45 AM      Result Value Ref Range   WBC 22.5 (*) 4.0 - 10.5 K/uL   RBC 4.24  4.22 - 5.81 MIL/uL   Hemoglobin 12.7 (*) 13.0 - 17.0 g/dL   HCT 38.0 (*) 39.0 - 52.0 %   MCV 89.6  78.0 - 100.0 fL   MCH 30.0  26.0 - 34.0 pg   MCHC 33.4  30.0 - 36.0 g/dL   RDW 14.7  11.5 - 15.5 %   Platelets 337  150 - 400 K/uL   Neutrophils Relative % 80 (*) 43 - 77 %   Neutro Abs 17.9 (*) 1.7 -  7.7 K/uL   Lymphocytes Relative 11 (*) 12 - 46 %   Lymphs Abs 2.4  0.7 - 4.0 K/uL   Monocytes Relative 9  3 - 12 %   Monocytes Absolute 2.0 (*) 0.1 - 1.0 K/uL   Eosinophils Relative 0  0 - 5 %   Eosinophils Absolute 0.1  0.0 - 0.7 K/uL   Basophils Relative 0  0 - 1 %   Basophils Absolute 0.1  0.0 - 0.1 K/uL  GLUCOSE, CAPILLARY     Status: Abnormal   Collection Time    06/30/14  7:41 AM      Result Value Ref Range   Glucose-Capillary 132 (*) 70 - 99 mg/dL  OCCULT BLOOD X 1 CARD TO LAB, STOOL     Status: Abnormal   Collection Time    06/30/14  8:31 AM      Result Value Ref Range   Fecal Occult Bld POSITIVE (*) NEGATIVE    Dg Chest 2 View  06/30/2014   CLINICAL DATA:  COPD with acute onset of difficulty breathing  EXAM: CHEST  2 VIEW  COMPARISON:  June 17, 2014  FINDINGS: There is underlying emphysematous change. There is no apparent edema or consolidation. The heart size is within normal limits. Pulmonary vascularity is within normal limits. No adenopathy. There is degenerative change in the  thoracic spine.  On the lateral view, the stomach appears distended with fluid and air.  IMPRESSION: Underlying emphysema.  No edema or consolidation.  On lateral view, stomach appears distended with fluid and air. Question a degree of underlying ileus. This finding may warrant abdominal radiographs for further assessment.   Electronically Signed   By: Lowella Grip M.D.   On: 06/30/2014 09:30               Blood pressure 106/72, pulse 102, temperature 97.7 F (36.5 C), temperature source Oral, resp. rate 18, weight 89.54 kg (197 lb 6.4 oz), SpO2 97.00%.  Physical exam:   General-- alert and oriented white male. Does not seem to be psychologically depressed. Answers questions appropriate. Heart-- regular rate and rhythm without murmurs are gallops Lungs--clear Abdomen-- soft nontender nomads is palpated none distended   Assessment: 1. Lower G.I. bleeding. This appears to be stable at the present time. He has known diverticulosis are Colonoscopy. He also has had previous  colon polyps removed. His most recent episode of bleeding seems to be most consistent with diverticular bleeding and appears to be stopped. 2. Atrial fibrillation anticoagulated 3. Recent Fall. Associated with seizures and head injury currently in a rehabilitation 4. COPD   Plan: at this point he seems relatively stable. He has known diverticular disease and has had recent colonoscopy. This point I would give him Miralax and follow his hemoglobin. He continues to bleed we may need to consider another colonoscopy at hopefully this will not be necessary. We will follow him with you.   Chad Morse,Chad Morse 06/30/2014, 11:39 AM

## 2014-06-30 NOTE — Progress Notes (Signed)
Social Work Patient ID: Chad Morse, male   DOB: May 13, 1941, 73 y.o.   MRN: [id number]  Met with pt and wife yesterday to review team conference.  Both very pleased with progress this week and pt reports that he plans to "stay active when I go home.  I want to stop just sitting around."  Aware and agreeable with targeted d/c date of 10/21 with supervision/ min assist goals.  Wife denies any concerns about her ability to provide this.  Will continue to follow.  Blia Totman, LCSW

## 2014-06-30 NOTE — Procedures (Addendum)
Objective Swallowing Evaluation: Modified Barium Swallowing Study  Patient Details  Name: Chad Morse MRN: [id number] Date of Birth: 29-Mar-1941  Today's Date: 06/30/2014 Time:  0930-1000    Past Medical History:  Past Medical History  Diagnosis Date  . COPD (chronic obstructive pulmonary disease)   . Diabetes mellitus   . Atrial fibrillation   . Peripheral neuropathy   . Gallstones   . Peripheral vascular disease   . IBS (irritable bowel syndrome)   . Peripheral arterial disease   . Tobacco abuse   . Hypertension   . Hyperlipidemia    Past Surgical History:  Past Surgical History  Procedure Laterality Date  . Back surgery     HPI:  73 y/o male with PMH: COPD, Afib, DM, PVD, IBS, HTN admitted after fall, struck his head with brief unresponsiveness.  Had seizure in ED and was intubated.  Per MD note pt has had very poor PO intake and has complained of his vision being blurry.  CT negative for new abnormality, remote right basal ganglia and left cerebelluym.  Extubated 9/29 and reintubaed 9/29-10/02. MRI revealed chronic changes as described. No acute intracranial findings. CXR cardiomegaly with bilateral pulmonary infiltrates and small pleural effusions most consistent with congestive heart failure.  Pt. continued to exhibit pharyngeal dysphagia, therefore, MBS was performed 06/20/14 with recommendations for Dys 1 textures and pudding thick liquids.     Recommendation/Prognosis  Clinical Impression:   Dysphagia Diagnosis: Moderate oral phase dysphagia;Moderate pharyngeal phase dysphagia  Clinical impression: Pt continues to exhibit a moderate oropharyngeal dysphagia that is sensorimotor and anatomically based. Suspected osteophytes (MD not present to confirm) impinge on the pharyngeal space, which interferes with epiglottic deflection and consequently airway protection, as well as clearance through the UES. Pharyngeal weakness contributes to residuals as well, with  multiple swallows needed to facilitate clearance. Pt penetrated nectar thick and thin liquids during the swallow due to a delay in initiation coupled with decreased airway seal, and all liquid consistencies are penetrated after the swallow on residuals. Pt has difficulty clearing penetrates because of a weakened cough, with nectar thick penetrates observed to pass below the voal cords. Honey thick liquid residuals result in trace, more shallow penetration. Recommend to initiate Dys 2 textures and honey thick liquids via tsp to reduce the amount of residuals and increase airway protection.   Swallow Evaluation Recommendations:  Diet Recommendations: Honey-thick liquid;Dysphagia 2 (Fine chop) Liquid Administration via: Spoon Medication Administration: Crushed with puree Supervision: Patient able to self feed;Full supervision/cueing for compensatory strategies Compensations: Slow rate;Small sips/bites;Clear throat after each swallow;Multiple dry swallows after each bite/sip Postural Changes and/or Swallow Maneuvers: Seated upright 90 degrees;Upright 30-60 min after meal Oral Care Recommendations: Oral care BID Other Recommendations: Order thickener from pharmacy;Prohibited food (jello, ice cream, thin soups);Remove water pitcher Follow up Recommendations: Home health SLP;24 hour supervision/assistance    Prognosis:  Prognosis for Safe Diet Advancement: Good Barriers to Reach Goals: Cognitive deficits (structural component)   Individuals Consulted: Consulted and Agree with Results and Recommendations: Patient;RN;PA      SLP Assessment/Plan  Plan:  Potential to Achieve Goals: Good Potential Considerations: Ability to learn/carryover information;Co-morbidities;Previous level of function   Short Term Goals: Week 2: SLP Short Term Goal 1 (Week 2): Patient will utilize external visual aids to recall new, daily information with Mod A multimodal cues.   SLP Short Term Goal 2 (Week 2): Patient will  demonstrate functional problem solving for basic and familiar tasks with Min A multimodal cues. SLP  Short Term Goal 3 (Week 2): Patient will sustain attention to basic and familiar tasks for 10 minutes with Min A multimodal cues. SLP Short Term Goal 4 (Week 2): Patient will identify 1 cognitive and 1 physical deficit with Mod A multimodal cues.  SLP Short Term Goal 5 (Week 2): Patient will utilize swallowing compensatory strategies with least restrictive diet to minimize overt s/s of aspiration with Mod A multimodal cues.  SLP Short Term Goal 6 (Week 2): Patient will orient to time, place, person and situation with Min A multimodal cues.     General: Date of Onset: 06/11/14 Type of Study: Modified Barium Swallowing Study Reason for Referral: Objectively evaluate swallowing function Previous Swallow Assessment: MBS 06/20/14 Diet Prior to this Study: Dysphagia 1 (puree);Pudding-thick liquids Temperature Spikes Noted: No Respiratory Status: Room air History of Recent Intubation: Yes Length of Intubations (days): 4 days Date extubated: 06/17/14 Behavior/Cognition: Alert;Cooperative;Pleasant mood;Impulsive;Requires cueing Oral Cavity - Dentition: Dentures, not available;Edentulous Oral Motor / Sensory Function: Impaired - see Bedside swallow eval Self-Feeding Abilities: Able to feed self Patient Positioning: Upright in chair Baseline Vocal Quality: Clear Volitional Cough: Weak Volitional Swallow: Unable to elicit Anatomy: Other (Comment) (suspected osteophytes along cervical spine impinging on pharyngeal space) Pharyngeal Secretions: Not observed secondary MBS   Reason for Referral:   Objectively evaluate swallowing function    Oral Phase: Oral Preparation/Oral Phase Oral Phase: Impaired Oral - Honey Oral - Honey Cup: Weak lingual manipulation;Lingual pumping;Incomplete tongue to palate contact;Reduced posterior propulsion;Delayed oral transit;Other (Comment) (decreased bolus  cohesion) Oral - Nectar Oral - Nectar Cup: Weak lingual manipulation;Lingual pumping;Incomplete tongue to palate contact;Reduced posterior propulsion;Delayed oral transit;Other (Comment) (decreased bolus cohesion) Oral - Thin Oral - Thin Cup: Weak lingual manipulation;Lingual pumping;Incomplete tongue to palate contact;Reduced posterior propulsion;Delayed oral transit;Other (Comment) (decreased bolus cohesion) Oral - Solids Oral - Puree: Weak lingual manipulation;Lingual pumping;Incomplete tongue to palate contact;Reduced posterior propulsion;Delayed oral transit;Other (Comment) (decreased bolus cohesion) Oral - Mechanical Soft: Weak lingual manipulation;Lingual pumping;Incomplete tongue to palate contact;Reduced posterior propulsion;Delayed oral transit;Other (Comment) (decreased bolus cohesion) Oral - Regular: Not tested   Pharyngeal Phase:  Pharyngeal Phase Pharyngeal Phase: Impaired Pharyngeal - Honey Pharyngeal - Honey Teaspoon: Not tested Pharyngeal - Honey Cup: Delayed swallow initiation;Reduced epiglottic inversion;Reduced laryngeal elevation;Reduced airway/laryngeal closure;Reduced tongue base retraction;Penetration/Aspiration after swallow;Pharyngeal residue - valleculae;Pharyngeal residue - pyriform sinuses;Other (Comment) Penetration/Aspiration details (honey cup): Material enters airway, remains ABOVE vocal cords and not ejected out Pharyngeal - Nectar Pharyngeal - Nectar Teaspoon: Not tested Pharyngeal - Nectar Cup: Delayed swallow initiation;Reduced epiglottic inversion;Reduced laryngeal elevation;Reduced airway/laryngeal closure;Reduced tongue base retraction;Penetration/Aspiration during swallow;Penetration/Aspiration after swallow;Pharyngeal residue - valleculae;Pharyngeal residue - pyriform sinuses;Compensatory strategies attempted (Comment) (chin tuck increases amount of penetration) Penetration/Aspiration details (nectar cup): Material enters airway, passes BELOW cords without  attempt by patient to eject out (silent aspiration) Pharyngeal - Thin Pharyngeal - Thin Teaspoon: Not tested Pharyngeal - Thin Cup: Delayed swallow initiation;Reduced epiglottic inversion;Reduced laryngeal elevation;Reduced airway/laryngeal closure;Reduced tongue base retraction;Penetration/Aspiration during swallow;Penetration/Aspiration after swallow;Pharyngeal residue - valleculae;Pharyngeal residue - pyriform sinuses Penetration/Aspiration details (thin cup): Material enters airway, remains ABOVE vocal cords and not ejected out Pharyngeal - Solids Pharyngeal - Puree: Delayed swallow initiation;Reduced epiglottic inversion;Reduced laryngeal elevation;Reduced airway/laryngeal closure;Reduced tongue base retraction;Pharyngeal residue - valleculae;Pharyngeal residue - pyriform sinuses Pharyngeal - Mechanical Soft: Delayed swallow initiation;Reduced epiglottic inversion;Reduced laryngeal elevation;Reduced airway/laryngeal closure;Reduced tongue base retraction;Pharyngeal residue - valleculae;Pharyngeal residue - pyriform sinuses Pharyngeal - Regular: Not tested   Cervical Esophageal Phase  Cervical Esophageal Phase Cervical Esophageal Phase: Impaired Cervical Esophageal  Phase - Comment Cervical Esophageal Comment: decreased clearance due to suspected osteophytes   GN         Germain Osgood, M.A. CCC-SLP [phone]  Germain Osgood 06/30/2014, 1:14 PM

## 2014-07-01 ENCOUNTER — Encounter (HOSPITAL_COMMUNITY): Payer: Medicare Other

## 2014-07-01 ENCOUNTER — Inpatient Hospital Stay (HOSPITAL_COMMUNITY): Payer: Medicare Other

## 2014-07-01 ENCOUNTER — Encounter (HOSPITAL_COMMUNITY): Payer: Self-pay

## 2014-07-01 ENCOUNTER — Ambulatory Visit (HOSPITAL_COMMUNITY): Payer: Medicare Other

## 2014-07-01 ENCOUNTER — Inpatient Hospital Stay (HOSPITAL_COMMUNITY)
Admission: AD | Admit: 2014-07-01 | Discharge: 2014-07-17 | DRG: 379 | Disposition: E | Payer: Medicare Other | Source: Ambulatory Visit | Attending: Pulmonary Disease | Admitting: Pulmonary Disease

## 2014-07-01 ENCOUNTER — Encounter (HOSPITAL_COMMUNITY): Admission: AD | Disposition: E | Payer: Self-pay | Source: Ambulatory Visit | Attending: Pulmonary Disease

## 2014-07-01 DIAGNOSIS — K922 Gastrointestinal hemorrhage, unspecified: Secondary | ICD-10-CM | POA: Diagnosis present

## 2014-07-01 DIAGNOSIS — I469 Cardiac arrest, cause unspecified: Secondary | ICD-10-CM | POA: Diagnosis present

## 2014-07-01 DIAGNOSIS — G629 Polyneuropathy, unspecified: Secondary | ICD-10-CM | POA: Diagnosis present

## 2014-07-01 DIAGNOSIS — E785 Hyperlipidemia, unspecified: Secondary | ICD-10-CM | POA: Diagnosis present

## 2014-07-01 DIAGNOSIS — I959 Hypotension, unspecified: Secondary | ICD-10-CM | POA: Diagnosis present

## 2014-07-01 DIAGNOSIS — R578 Other shock: Secondary | ICD-10-CM | POA: Diagnosis present

## 2014-07-01 DIAGNOSIS — K5791 Diverticulosis of intestine, part unspecified, without perforation or abscess with bleeding: Principal | ICD-10-CM | POA: Diagnosis present

## 2014-07-01 DIAGNOSIS — K589 Irritable bowel syndrome without diarrhea: Secondary | ICD-10-CM | POA: Diagnosis present

## 2014-07-01 DIAGNOSIS — R579 Shock, unspecified: Secondary | ICD-10-CM

## 2014-07-01 DIAGNOSIS — F17210 Nicotine dependence, cigarettes, uncomplicated: Secondary | ICD-10-CM | POA: Diagnosis present

## 2014-07-01 DIAGNOSIS — I4891 Unspecified atrial fibrillation: Secondary | ICD-10-CM | POA: Diagnosis present

## 2014-07-01 DIAGNOSIS — E119 Type 2 diabetes mellitus without complications: Secondary | ICD-10-CM | POA: Diagnosis present

## 2014-07-01 DIAGNOSIS — K2900 Acute gastritis without bleeding: Secondary | ICD-10-CM | POA: Diagnosis present

## 2014-07-01 DIAGNOSIS — K284 Chronic or unspecified gastrojejunal ulcer with hemorrhage: Secondary | ICD-10-CM

## 2014-07-01 DIAGNOSIS — K269 Duodenal ulcer, unspecified as acute or chronic, without hemorrhage or perforation: Secondary | ICD-10-CM | POA: Diagnosis present

## 2014-07-01 DIAGNOSIS — J438 Other emphysema: Secondary | ICD-10-CM

## 2014-07-01 DIAGNOSIS — J449 Chronic obstructive pulmonary disease, unspecified: Secondary | ICD-10-CM | POA: Diagnosis present

## 2014-07-01 DIAGNOSIS — I739 Peripheral vascular disease, unspecified: Secondary | ICD-10-CM | POA: Diagnosis present

## 2014-07-01 DIAGNOSIS — R0689 Other abnormalities of breathing: Secondary | ICD-10-CM | POA: Diagnosis present

## 2014-07-01 DIAGNOSIS — Z7901 Long term (current) use of anticoagulants: Secondary | ICD-10-CM

## 2014-07-01 DIAGNOSIS — I10 Essential (primary) hypertension: Secondary | ICD-10-CM | POA: Diagnosis present

## 2014-07-01 HISTORY — PX: ESOPHAGOGASTRODUODENOSCOPY: SHX5428

## 2014-07-01 LAB — COMPREHENSIVE METABOLIC PANEL
ALK PHOS: 66 U/L (ref 39–117)
ALT: 125 U/L — ABNORMAL HIGH (ref 0–53)
ANION GAP: 19 — AB (ref 5–15)
AST: 99 U/L — ABNORMAL HIGH (ref 0–37)
Albumin: 1.7 g/dL — ABNORMAL LOW (ref 3.5–5.2)
BILIRUBIN TOTAL: 0.4 mg/dL (ref 0.3–1.2)
BUN: 61 mg/dL — AB (ref 6–23)
CHLORIDE: 110 meq/L (ref 96–112)
CO2: 16 mEq/L — ABNORMAL LOW (ref 19–32)
Calcium: 7.3 mg/dL — ABNORMAL LOW (ref 8.4–10.5)
Creatinine, Ser: 1.25 mg/dL (ref 0.50–1.35)
GFR calc non Af Amer: 55 mL/min — ABNORMAL LOW (ref >90)
GFR, EST AFRICAN AMERICAN: 63 mL/min — AB (ref >90)
Glucose, Bld: 108 mg/dL — ABNORMAL HIGH (ref 70–99)
Potassium: 6 mEq/L — ABNORMAL HIGH (ref 3.7–5.3)
Sodium: 145 mEq/L (ref 137–147)
Total Protein: 3.8 g/dL — ABNORMAL LOW (ref 6.0–8.3)

## 2014-07-01 LAB — POCT I-STAT 3, ART BLOOD GAS (G3+)
ACID-BASE DEFICIT: 9 mmol/L — AB (ref 0.0–2.0)
Acid-base deficit: 18 mmol/L — ABNORMAL HIGH (ref 0.0–2.0)
BICARBONATE: 17.6 meq/L — AB (ref 20.0–24.0)
BICARBONATE: 11.5 meq/L — AB (ref 20.0–24.0)
O2 SAT: 89 %
O2 Saturation: 100 %
PCO2 ART: 42.2 mmHg (ref 35.0–45.0)
PH ART: 7.027 — AB (ref 7.350–7.450)
Patient temperature: 94.3
TCO2: 19 mmol/L (ref 0–100)
TCO2: 13 mmol/L (ref 0–100)
pCO2 arterial: 46.9 mmHg — ABNORMAL HIGH (ref 35.0–45.0)
pH, Arterial: 7.183 — CL (ref 7.350–7.450)
pO2, Arterial: 70 mmHg — ABNORMAL LOW (ref 80.0–100.0)
pO2, Arterial: 476 mmHg — ABNORMAL HIGH (ref 80.0–100.0)

## 2014-07-01 LAB — CBC WITH DIFFERENTIAL/PLATELET
BASOS PCT: 0 % (ref 0–1)
Basophils Absolute: 0 10*3/uL (ref 0.0–0.1)
Eosinophils Absolute: 0 10*3/uL (ref 0.0–0.7)
Eosinophils Relative: 0 % (ref 0–5)
HCT: 18.8 % — ABNORMAL LOW (ref 39.0–52.0)
HEMOGLOBIN: 6 g/dL — AB (ref 13.0–17.0)
LYMPHS ABS: 3.7 10*3/uL (ref 0.7–4.0)
Lymphocytes Relative: 13 % (ref 12–46)
MCH: 30 pg (ref 26.0–34.0)
MCHC: 31.9 g/dL (ref 30.0–36.0)
MCV: 94 fL (ref 78.0–100.0)
Monocytes Absolute: 2.6 10*3/uL — ABNORMAL HIGH (ref 0.1–1.0)
Monocytes Relative: 9 % (ref 3–12)
NRBC: 3 /100{WBCs} — AB
Neutro Abs: 22.1 10*3/uL — ABNORMAL HIGH (ref 1.7–7.7)
Neutrophils Relative %: 78 % — ABNORMAL HIGH (ref 43–77)
Platelets: 287 10*3/uL (ref 150–400)
RBC: 2 MIL/uL — ABNORMAL LOW (ref 4.22–5.81)
RDW: 15.7 % — ABNORMAL HIGH (ref 11.5–15.5)
WBC: 28.4 10*3/uL — ABNORMAL HIGH (ref 4.0–10.5)

## 2014-07-01 LAB — PREPARE RBC (CROSSMATCH)

## 2014-07-01 LAB — CBC
HCT: 26.9 % — ABNORMAL LOW (ref 39.0–52.0)
HEMATOCRIT: 24.9 % — AB (ref 39.0–52.0)
HEMOGLOBIN: 9 g/dL — AB (ref 13.0–17.0)
HEMOGLOBIN: 8.1 g/dL — AB (ref 13.0–17.0)
MCH: 30.7 pg (ref 26.0–34.0)
MCH: 30.2 pg (ref 26.0–34.0)
MCHC: 33.5 g/dL (ref 30.0–36.0)
MCHC: 32.5 g/dL (ref 30.0–36.0)
MCV: 91.8 fL (ref 78.0–100.0)
MCV: 92.9 fL (ref 78.0–100.0)
Platelets: 295 10*3/uL (ref 150–400)
Platelets: 309 10*3/uL (ref 150–400)
RBC: 2.93 MIL/uL — AB (ref 4.22–5.81)
RBC: 2.68 MIL/uL — AB (ref 4.22–5.81)
RDW: 15.1 % (ref 11.5–15.5)
RDW: 15.3 % (ref 11.5–15.5)
WBC: 22.2 10*3/uL — ABNORMAL HIGH (ref 4.0–10.5)
WBC: 22.8 10*3/uL — ABNORMAL HIGH (ref 4.0–10.5)

## 2014-07-01 LAB — GLUCOSE, CAPILLARY
GLUCOSE-CAPILLARY: 142 mg/dL — AB (ref 70–99)
Glucose-Capillary: 128 mg/dL — ABNORMAL HIGH (ref 70–99)
Glucose-Capillary: 109 mg/dL — ABNORMAL HIGH (ref 70–99)
Glucose-Capillary: 107 mg/dL — ABNORMAL HIGH (ref 70–99)

## 2014-07-01 LAB — PROCALCITONIN: Procalcitonin: 0.26 ng/mL

## 2014-07-01 LAB — PROTIME-INR
INR: 2.95 — ABNORMAL HIGH (ref 0.00–1.49)
Prothrombin Time: 31 seconds — ABNORMAL HIGH (ref 11.6–15.2)

## 2014-07-01 LAB — LACTIC ACID, PLASMA: LACTIC ACID, VENOUS: 9.4 mmol/L — AB (ref 0.5–2.2)

## 2014-07-01 LAB — MRSA PCR SCREENING: MRSA by PCR: NEGATIVE

## 2014-07-01 LAB — APTT: aPTT: 32 seconds (ref 24–37)

## 2014-07-01 LAB — MAGNESIUM: Magnesium: 1.7 mg/dL (ref 1.5–2.5)

## 2014-07-01 LAB — PHOSPHORUS: Phosphorus: 5 mg/dL — ABNORMAL HIGH (ref 2.3–4.6)

## 2014-07-01 SURGERY — EGD (ESOPHAGOGASTRODUODENOSCOPY)
Anesthesia: Moderate Sedation

## 2014-07-01 MED ORDER — PANTOPRAZOLE SODIUM 40 MG IV SOLR
40.0000 mg | Freq: Two times a day (BID) | INTRAVENOUS | Status: DC
  Filled 2014-07-01 (×2): qty 40

## 2014-07-01 MED ORDER — HYDROCORTISONE NA SUCCINATE PF 100 MG IJ SOLR
50.0000 mg | Freq: Four times a day (QID) | INTRAMUSCULAR | Status: DC
  Administered 2014-07-01: 20:00:00 via INTRAVENOUS
  Administered 2014-07-02: 50 mg via INTRAVENOUS
  Filled 2014-07-01 (×7): qty 1

## 2014-07-01 MED ORDER — SODIUM CHLORIDE 0.9 % IV SOLN: Freq: Once | INTRAVENOUS | Status: DC

## 2014-07-01 MED ORDER — MIDAZOLAM HCL 5 MG/ML IJ SOLN
INTRAMUSCULAR | Status: AC
  Filled 2014-07-01: qty 2

## 2014-07-01 MED ORDER — ALBUMIN HUMAN 25 % IV SOLN
12.5000 g | Freq: Once | INTRAVENOUS | Status: AC
  Administered 2014-07-02: 12.5 g via INTRAVENOUS
  Filled 2014-07-01: qty 50

## 2014-07-01 MED ORDER — SODIUM BICARBONATE 8.4 % IV SOLN
INTRAVENOUS | Status: AC
  Administered 2014-07-01: 50 meq
  Filled 2014-07-01: qty 50

## 2014-07-01 MED ORDER — PANTOPRAZOLE SODIUM 40 MG IV SOLR
40.0000 mg | INTRAVENOUS | Status: DC
  Filled 2014-07-01: qty 40

## 2014-07-01 MED ORDER — FENTANYL BOLUS VIA INFUSION
25.0000 ug | INTRAVENOUS | Status: DC | PRN
  Administered 2014-07-02 (×2): 50 ug via INTRAVENOUS
  Filled 2014-07-01: qty 50

## 2014-07-01 MED ORDER — SODIUM CHLORIDE 0.9 % IV SOLN
INTRAVENOUS | Status: DC
  Administered 2014-07-01: 23:00:00 via INTRAVENOUS

## 2014-07-01 MED ORDER — SODIUM CHLORIDE 0.9 % IV BOLUS (SEPSIS)
1000.0000 mL | Freq: Once | INTRAVENOUS | Status: AC
  Administered 2014-07-01: 1000 mL via INTRAVENOUS
  Administered 2014-07-01: 18:00:00 via INTRAVENOUS

## 2014-07-01 MED ORDER — CETYLPYRIDINIUM CHLORIDE 0.05 % MT LIQD
7.0000 mL | Freq: Four times a day (QID) | OROMUCOSAL | Status: DC
  Administered 2014-07-02: 7 mL via OROMUCOSAL

## 2014-07-01 MED ORDER — NOREPINEPHRINE BITARTRATE 1 MG/ML IV SOLN
0.0000 ug/min | INTRAVENOUS | Status: DC
  Administered 2014-07-01: 50 ug/min via INTRAVENOUS
  Administered 2014-07-01: 40 ug/min via INTRAVENOUS
  Administered 2014-07-02: 50 ug/min via INTRAVENOUS
  Filled 2014-07-01 (×3): qty 16

## 2014-07-01 MED ORDER — SODIUM BICARBONATE 8.4 % IV SOLN
100.0000 meq | Freq: Once | INTRAVENOUS | Status: AC
  Administered 2014-07-01: 100 meq via INTRAVENOUS

## 2014-07-01 MED ORDER — SODIUM CHLORIDE 0.9 % IV BOLUS (SEPSIS): 1000.0000 mL | Freq: Once | INTRAVENOUS | Status: DC

## 2014-07-01 MED ORDER — ASPIRIN 300 MG RE SUPP: 300.0000 mg | RECTAL | Status: DC

## 2014-07-01 MED ORDER — SODIUM CHLORIDE 0.9 % IV SOLN
INTRAVENOUS | Status: DC
  Administered 2014-07-01: 11:00:00 via INTRAVENOUS

## 2014-07-01 MED ORDER — FENTANYL CITRATE 0.05 MG/ML IJ SOLN
50.0000 ug | Freq: Once | INTRAMUSCULAR | Status: AC
  Administered 2014-07-01: 50 ug via INTRAVENOUS
  Filled 2014-07-01: qty 2

## 2014-07-01 MED ORDER — FUROSEMIDE 20 MG PO TABS
20.0000 mg | ORAL_TABLET | Freq: Every day | ORAL | Status: DC
  Filled 2014-07-01: qty 1

## 2014-07-01 MED ORDER — ONDANSETRON HCL 4 MG/2ML IJ SOLN: 4.0000 mg | Freq: Four times a day (QID) | INTRAMUSCULAR | Status: DC | PRN

## 2014-07-01 MED ORDER — SODIUM CHLORIDE 0.9 % IV SOLN: 250.0000 mL | INTRAVENOUS | Status: DC | PRN

## 2014-07-01 MED ORDER — CEFUROXIME AXETIL 250 MG PO TABS
250.0000 mg | ORAL_TABLET | Freq: Two times a day (BID) | ORAL | Status: DC
  Filled 2014-07-01 (×2): qty 1

## 2014-07-01 MED ORDER — CETYLPYRIDINIUM CHLORIDE 0.05 % MT LIQD: 7.0000 mL | Freq: Two times a day (BID) | OROMUCOSAL | Status: DC

## 2014-07-01 MED ORDER — FENTANYL CITRATE 0.05 MG/ML IJ SOLN: 50.0000 ug | INTRAMUSCULAR | Status: DC | PRN

## 2014-07-01 MED ORDER — SODIUM CHLORIDE 0.9 % IV SOLN
500.0000 mg | Freq: Three times a day (TID) | INTRAVENOUS | Status: DC
  Administered 2014-07-02: 500 mg via INTRAVENOUS
  Filled 2014-07-01 (×4): qty 500

## 2014-07-01 MED ORDER — INSULIN ASPART 100 UNIT/ML ~~LOC~~ SOLN: 2.0000 [IU] | SUBCUTANEOUS | Status: DC

## 2014-07-01 MED ORDER — FENTANYL CITRATE 0.05 MG/ML IJ SOLN
INTRAMUSCULAR | Status: AC
  Administered 2014-07-01: 100 ug
  Filled 2014-07-01: qty 2

## 2014-07-01 MED ORDER — PANTOPRAZOLE SODIUM 40 MG IV SOLR
40.0000 mg | Freq: Two times a day (BID) | INTRAVENOUS | Status: DC
  Administered 2014-07-01: 40 mg via INTRAVENOUS
  Filled 2014-07-01: qty 40

## 2014-07-01 MED ORDER — PANTOPRAZOLE SODIUM 40 MG IV SOLR: 40.0000 mg | Freq: Two times a day (BID) | INTRAVENOUS | Status: DC

## 2014-07-01 MED ORDER — SODIUM CHLORIDE 0.9 % IV SOLN: INTRAVENOUS | Status: DC

## 2014-07-01 MED ORDER — MIDAZOLAM HCL 2 MG/2ML IJ SOLN
INTRAMUSCULAR | Status: AC
  Administered 2014-07-01: 2 mg
  Filled 2014-07-01: qty 2

## 2014-07-01 MED ORDER — TECHNETIUM TC 99M-LABELED RED BLOOD CELLS IV KIT
25.0000 | PACK | Freq: Once | INTRAVENOUS | Status: AC | PRN
  Administered 2014-07-01: 25 via INTRAVENOUS

## 2014-07-01 MED ORDER — CHLORHEXIDINE GLUCONATE 0.12 % MT SOLN
15.0000 mL | Freq: Two times a day (BID) | OROMUCOSAL | Status: DC
  Administered 2014-07-01: 15 mL via OROMUCOSAL
  Filled 2014-07-01: qty 15

## 2014-07-01 MED ORDER — ASPIRIN 81 MG PO CHEW: 324.0000 mg | CHEWABLE_TABLET | ORAL | Status: DC

## 2014-07-01 MED ORDER — VASOPRESSIN 20 UNIT/ML IJ SOLN
0.0300 [IU]/min | INTRAVENOUS | Status: DC
  Administered 2014-07-01: 0.03 [IU]/min via INTRAVENOUS
  Filled 2014-07-01: qty 2

## 2014-07-01 MED ORDER — DILTIAZEM HCL 30 MG PO TABS
30.0000 mg | ORAL_TABLET | Freq: Four times a day (QID) | ORAL | Status: DC
  Administered 2014-07-01: 30 mg via ORAL
  Filled 2014-07-01 (×4): qty 1

## 2014-07-01 MED ORDER — CEFUROXIME AXETIL 250 MG/5ML PO SUSR: 250.0000 mg | Freq: Two times a day (BID) | ORAL | Status: DC

## 2014-07-01 MED ORDER — SODIUM CHLORIDE 0.9 % IV SOLN
0.0000 ug/h | INTRAVENOUS | Status: DC
  Administered 2014-07-01: 50 ug/h via INTRAVENOUS
  Filled 2014-07-01: qty 50

## 2014-07-01 MED ORDER — SODIUM CHLORIDE 0.9 % IV BOLUS (SEPSIS)
500.0000 mL | Freq: Once | INTRAVENOUS | Status: AC
Start: 2014-07-01 — End: 2014-07-01
  Administered 2014-07-01: 500 mL via INTRAVENOUS

## 2014-07-01 MED ORDER — HYDROCORTISONE NA SUCCINATE PF 100 MG IJ SOLR
50.0000 mg | Freq: Four times a day (QID) | INTRAMUSCULAR | Status: DC
  Filled 2014-07-01 (×3): qty 1

## 2014-07-01 MED ORDER — HYDROCODONE-ACETAMINOPHEN 5-325 MG PO TABS
ORAL_TABLET | ORAL | Status: AC
  Administered 2014-07-01: 1 via ORAL
  Filled 2014-07-01: qty 1

## 2014-07-01 MED ORDER — DOPAMINE-DEXTROSE 3.2-5 MG/ML-% IV SOLN
0.0000 ug/kg/min | INTRAVENOUS | Status: DC
  Administered 2014-07-02: 20 ug/kg/min via INTRAVENOUS
  Filled 2014-07-01: qty 250

## 2014-07-01 MED ORDER — SODIUM CHLORIDE 0.9 % IV BOLUS (SEPSIS)
1000.0000 mL | Freq: Once | INTRAVENOUS | Status: AC
  Administered 2014-07-01: 1000 mL via INTRAVENOUS

## 2014-07-01 MED ORDER — TIOTROPIUM BROMIDE MONOHYDRATE 18 MCG IN CAPS
18.0000 ug | ORAL_CAPSULE | Freq: Every day | RESPIRATORY_TRACT | Status: DC
  Filled 2014-07-01: qty 5

## 2014-07-01 NOTE — Discharge Summary (Signed)
Discharge summary job # [id number]

## 2014-07-01 NOTE — Progress Notes (Signed)
Speech Language Pathology Daily Session Note  Patient Details  Name: Chad Morse MRN: [id number] Date of Birth: Apr 26, 1941  Today's Date: 06/17/2014 SLP Individual Time: 1103-1140 SLP Individual Time Calculation (min): 37 min  Short Term Goals: Week 2: SLP Short Term Goal 1 (Week 2): Patient will utilize external visual aids to recall new, daily information with Mod A multimodal cues.   SLP Short Term Goal 2 (Week 2): Patient will demonstrate functional problem solving for basic and familiar tasks with Min A multimodal cues. SLP Short Term Goal 3 (Week 2): Patient will sustain attention to basic and familiar tasks for 10 minutes with Min A multimodal cues. SLP Short Term Goal 4 (Week 2): Patient will identify 1 cognitive and 1 physical deficit with Mod A multimodal cues.  SLP Short Term Goal 5 (Week 2): Patient will utilize swallowing compensatory strategies with least restrictive diet to minimize overt s/s of aspiration with Mod A multimodal cues.  SLP Short Term Goal 6 (Week 2): Patient will orient to time, place, person and situation with Min A multimodal cues.   Skilled Therapeutic Interventions: Treatment focused on swallowing and cognitive goals. Patient able to recall auditorilly presented information following 5 minute delays with min verbal questioning cues (category). Able to demonstrate basic problem solving skills related to functional task (thickening liquids for meal) with moderate SLP verbal and demonstration cues with poor eyesight impacting function today for reading directions. Able to independently verbalize aspiration precautions and compensatory strategies for safe po intake related to dysphagia diagnosis however required supervision assist for carryover into self feeding tasks. Patient with increased SOB, now on  O2 via nasal cannula which wil lincrease aspiration risk. Educated patient, family and RN regarding need to strictly comply with precautions and take frequent  breaks as needed.    FIM:     Pain Pain Assessment Pain Assessment: No/denies pain  Therapy/Group: Individual Therapy  Gabriel Rainwater MA, [place] [phone]   Chad Morse 06/19/2014, 1:50 PM

## 2014-07-01 NOTE — Progress Notes (Signed)
[facility] Progress Note  Subjective: No complaints of abdominal pain. Had one bloody stool this morning that seems of lower volume than yesterday according to him.  Objective: Vital signs in last 24 hours: Temp:  [98 F (36.7 C)-98.2 F (36.8 C)] 98 F (36.7 C) (10/16 0534) Pulse Rate:  [43-104] 43 (10/16 0534) Resp:  [18-19] 19 (10/16 0534) BP: (84-116)/(49-72) 115/63 mmHg (10/16 0534) SpO2:  [91 %-98 %] 96 % (10/16 0743) Weight change:    SH:[id number] pale, alert oriented in no acute distress abdomen soft nontender nondistended  Lab Results: Results for orders placed during the hospital encounter of 06/21/14 (from the past 24 hour(s))  OCCULT BLOOD X 1 CARD TO LAB, STOOL     Status: Abnormal   Collection Time    06/30/14 11:08 AM      Result Value Ref Range   Fecal Occult Bld POSITIVE (*) NEGATIVE  GLUCOSE, CAPILLARY     Status: Abnormal   Collection Time    06/30/14 11:21 AM      Result Value Ref Range   Glucose-Capillary 148 (*) 70 - 99 mg/dL  OCCULT BLOOD X 1 CARD TO LAB, STOOL     Status: Abnormal   Collection Time    06/30/14  1:55 PM      Result Value Ref Range   Fecal Occult Bld POSITIVE (*) NEGATIVE  CBC     Status: Abnormal   Collection Time    06/30/14  2:00 PM      Result Value Ref Range   WBC 24.4 (*) 4.0 - 10.5 K/uL   RBC 3.79 (*) 4.22 - 5.81 MIL/uL   Hemoglobin 11.6 (*) 13.0 - 17.0 g/dL   HCT 34.9 (*) 39.0 - 52.0 %   MCV 92.1  78.0 - 100.0 fL   MCH 30.6  26.0 - 34.0 pg   MCHC 33.2  30.0 - 36.0 g/dL   RDW 14.8  11.5 - 15.5 %   Platelets 335  150 - 400 K/uL  URINALYSIS, ROUTINE W REFLEX MICROSCOPIC     Status: None   Collection Time    06/30/14  2:05 PM      Result Value Ref Range   Color, Urine YELLOW  YELLOW   APPearance CLEAR  CLEAR   Specific Gravity, Urine 1.023  1.005 - 1.030   pH 5.0  5.0 - 8.0   Glucose, UA NEGATIVE  NEGATIVE mg/dL   Hgb urine dipstick NEGATIVE  NEGATIVE   Bilirubin Urine NEGATIVE  NEGATIVE   Ketones, ur  NEGATIVE  NEGATIVE mg/dL   Protein, ur NEGATIVE  NEGATIVE mg/dL   Urobilinogen, UA 1.0  0.0 - 1.0 mg/dL   Nitrite NEGATIVE  NEGATIVE   Leukocytes, UA NEGATIVE  NEGATIVE  GLUCOSE, CAPILLARY     Status: Abnormal   Collection Time    06/30/14  4:38 PM      Result Value Ref Range   Glucose-Capillary 154 (*) 70 - 99 mg/dL  CBC     Status: Abnormal   Collection Time    06/30/14  7:04 PM      Result Value Ref Range   WBC 24.7 (*) 4.0 - 10.5 K/uL   RBC 3.80 (*) 4.22 - 5.81 MIL/uL   Hemoglobin 11.4 (*) 13.0 - 17.0 g/dL   HCT 34.0 (*) 39.0 - 52.0 %   MCV 89.5  78.0 - 100.0 fL   MCH 30.0  26.0 - 34.0 pg   MCHC 33.5  30.0 - 36.0 g/dL  RDW 14.9  11.5 - 15.5 %   Platelets 349  150 - 400 K/uL  GLUCOSE, CAPILLARY     Status: Abnormal   Collection Time    06/30/14  9:08 PM      Result Value Ref Range   Glucose-Capillary 160 (*) 70 - 99 mg/dL  CBC     Status: Abnormal   Collection Time    06/30/14 10:00 PM      Result Value Ref Range   WBC 21.8 (*) 4.0 - 10.5 K/uL   RBC 3.32 (*) 4.22 - 5.81 MIL/uL   Hemoglobin 10.0 (*) 13.0 - 17.0 g/dL   HCT 29.6 (*) 39.0 - 52.0 %   MCV 89.2  78.0 - 100.0 fL   MCH 30.1  26.0 - 34.0 pg   MCHC 33.8  30.0 - 36.0 g/dL   RDW 14.8  11.5 - 15.5 %   Platelets 307  150 - 400 K/uL  CBC     Status: Abnormal   Collection Time    06/17/2014  5:34 AM      Result Value Ref Range   WBC 22.2 (*) 4.0 - 10.5 K/uL   RBC 2.93 (*) 4.22 - 5.81 MIL/uL   Hemoglobin 9.0 (*) 13.0 - 17.0 g/dL   HCT 26.9 (*) 39.0 - 52.0 %   MCV 91.8  78.0 - 100.0 fL   MCH 30.7  26.0 - 34.0 pg   MCHC 33.5  30.0 - 36.0 g/dL   RDW 15.1  11.5 - 15.5 %   Platelets 295  150 - 400 K/uL  GLUCOSE, CAPILLARY     Status: Abnormal   Collection Time    07/01/2014  7:10 AM      Result Value Ref Range   Glucose-Capillary 128 (*) 70 - 99 mg/dL    Studies/Results: Dg Chest 2 View  06/30/2014   CLINICAL DATA:  COPD with acute onset of difficulty breathing  EXAM: CHEST  2 VIEW  COMPARISON:  June 17, 2014  FINDINGS: There is underlying emphysematous change. There is no apparent edema or consolidation. The heart size is within normal limits. Pulmonary vascularity is within normal limits. No adenopathy. There is degenerative change in the thoracic spine.  On the lateral view, the stomach appears distended with fluid and air.  IMPRESSION: Underlying emphysema.  No edema or consolidation.  On lateral view, stomach appears distended with fluid and air. Question a degree of underlying ileus. This finding may warrant abdominal radiographs for further assessment.   Electronically Signed   By: Lowella Grip M.D.   On: 06/30/2014 09:30   Dg Swallowing Func-speech Pathology  06/30/2014   Loletha Grayer, CCC-SLP     06/30/2014  1:16 PM   Objective Swallowing Evaluation: Modified Barium Swallowing Study   Patient Details  Name: Chad Morse MRN: [id number] Date of Birth: [birth year]  Today's Date: 06/30/2014 Time:  0930-1000    Past Medical History:  Past Medical History  Diagnosis Date  . COPD (chronic obstructive pulmonary disease)   . Diabetes mellitus   . Atrial fibrillation   . Peripheral neuropathy   . Gallstones   . Peripheral vascular disease   . IBS (irritable bowel syndrome)   . Peripheral arterial disease   . Tobacco abuse   . Hypertension   . Hyperlipidemia    Past Surgical History:  Past Surgical History  Procedure Laterality Date  . Back surgery     HPI:  73 y/o male with PMH: COPD, Afib,  DM, PVD, IBS, HTN admitted  after fall, struck his head with brief unresponsiveness.  Had  seizure in ED and was intubated.  Per MD note pt has had very  poor PO intake and has complained of his vision being blurry.  CT  negative for new abnormality, remote right basal ganglia and left  cerebelluym.  Extubated 9/29 and reintubaed 9/29-10/02. MRI  revealed chronic changes as described. No acute intracranial  findings. CXR cardiomegaly with bilateral pulmonary infiltrates  and small pleural effusions most  consistent with congestive heart  failure.  Pt. continued to exhibit pharyngeal dysphagia,  therefore, MBS was performed 06/20/14 with recommendations for Dys  1 textures and pudding thick liquids.     Recommendation/Prognosis  Clinical Impression:   Dysphagia Diagnosis: Moderate oral phase dysphagia;Moderate  pharyngeal phase dysphagia  Clinical impression: Pt continues to exhibit a moderate  oropharyngeal dysphagia that is sensorimotor and anatomically  based. Suspected osteophytes (MD not present to confirm) impinge  on the pharyngeal space, which interferes with epiglottic  deflection and consequently airway protection, as well as  clearance through the UES. Pharyngeal weakness contributes to  residuals as well, with multiple swallows needed to facilitate  clearance. Pt penetrated nectar thick and thin liquids during the  swallow due to a delay in initiation coupled with decreased  airway seal, and all liquid consistencies are penetrated after  the swallow on residuals. Pt has difficulty clearing penetrates  because of a weakened cough, with nectar thick penetrates  observed to pass below the voal cords. Honey thick liquid  residuals result in trace, more shallow penetration. Recommend to  initiate Dys 2 textures and honey thick liquids via tsp to reduce  the amount of residuals and increase airway protection.   Swallow Evaluation Recommendations:  Diet Recommendations: Honey-thick liquid;Dysphagia 2 (Fine chop) Liquid Administration via: Spoon Medication Administration: Crushed with puree Supervision: Patient able to self feed;Full supervision/cueing  for compensatory strategies Compensations: Slow rate;Small sips/bites;Clear throat after each  swallow;Multiple dry swallows after each bite/sip Postural Changes and/or Swallow Maneuvers: Seated upright 90  degrees;Upright 30-60 min after meal Oral Care Recommendations: Oral care BID Other Recommendations: Order thickener from pharmacy;Prohibited  food (jello, ice  cream, thin soups);Remove water pitcher Follow up Recommendations: Home health SLP;24 hour  supervision/assistance    Prognosis:  Prognosis for Safe Diet Advancement: Good Barriers to Reach Goals: Cognitive deficits (structural  component)   Individuals Consulted: Consulted and Agree with Results and  Recommendations: Patient;RN;PA      SLP Assessment/Plan  Plan:  Potential to Achieve Goals: Good Potential Considerations: Ability to learn/carryover  information;Co-morbidities;Previous level of function   Short Term Goals: Week 2: SLP Short Term Goal 1 (Week 2): Patient  will utilize external visual aids to recall new, daily  information with Mod A multimodal cues.   SLP Short Term Goal 2 (Week 2): Patient will demonstrate  functional problem solving for basic and familiar tasks with Min  A multimodal cues. SLP Short Term Goal 3 (Week 2): Patient will sustain attention to  basic and familiar tasks for 10 minutes with Min A multimodal  cues. SLP Short Term Goal 4 (Week 2): Patient will identify 1 cognitive  and 1 physical deficit with Mod A multimodal cues.  SLP Short Term Goal 5 (Week 2): Patient will utilize swallowing  compensatory strategies with least restrictive diet to minimize  overt s/s of aspiration with Mod A multimodal cues.  SLP Short Term Goal 6 (Week 2): Patient will orient to time,  place, person and situation with Min A multimodal cues.     General: Date of Onset: 06/11/14 Type of Study: Modified Barium Swallowing Study Reason for Referral: Objectively evaluate swallowing function Previous Swallow Assessment: MBS 06/20/14 Diet Prior to this Study: Dysphagia 1 (puree);Pudding-thick  liquids Temperature Spikes Noted: No Respiratory Status: Room air History of Recent Intubation: Yes Length of Intubations (days): 4 days Date extubated: 06/17/14 Behavior/Cognition: Alert;Cooperative;Pleasant  mood;Impulsive;Requires cueing Oral Cavity - Dentition: Dentures, not available;Edentulous Oral Motor / Sensory  Function: Impaired - see Bedside swallow  eval Self-Feeding Abilities: Able to feed self Patient Positioning: Upright in chair Baseline Vocal Quality: Clear Volitional Cough: Weak Volitional Swallow: Unable to elicit Anatomy: Other (Comment) (suspected osteophytes along cervical  spine impinging on pharyngeal space) Pharyngeal Secretions: Not observed secondary MBS   Reason for Referral:   Objectively evaluate swallowing function    Oral Phase: Oral Preparation/Oral Phase Oral Phase: Impaired Oral - Honey Oral - Honey Cup: Weak lingual manipulation;Lingual  pumping;Incomplete tongue to palate contact;Reduced posterior  propulsion;Delayed oral transit;Other (Comment) (decreased bolus  cohesion) Oral - Nectar Oral - Nectar Cup: Weak lingual manipulation;Lingual  pumping;Incomplete tongue to palate contact;Reduced posterior  propulsion;Delayed oral transit;Other (Comment) (decreased bolus  cohesion) Oral - Thin Oral - Thin Cup: Weak lingual manipulation;Lingual  pumping;Incomplete tongue to palate contact;Reduced posterior  propulsion;Delayed oral transit;Other (Comment) (decreased bolus  cohesion) Oral - Solids Oral - Puree: Weak lingual manipulation;Lingual  pumping;Incomplete tongue to palate contact;Reduced posterior  propulsion;Delayed oral transit;Other (Comment) (decreased bolus  cohesion) Oral - Mechanical Soft: Weak lingual manipulation;Lingual  pumping;Incomplete tongue to palate contact;Reduced posterior  propulsion;Delayed oral transit;Other (Comment) (decreased bolus  cohesion) Oral - Regular: Not tested   Pharyngeal Phase:  Pharyngeal Phase Pharyngeal Phase: Impaired Pharyngeal - Honey Pharyngeal - Honey Teaspoon: Not tested Pharyngeal - Honey Cup: Delayed swallow initiation;Reduced  epiglottic inversion;Reduced laryngeal elevation;Reduced  airway/laryngeal closure;Reduced tongue base  retraction;Penetration/Aspiration after swallow;Pharyngeal  residue - valleculae;Pharyngeal residue - pyriform  sinuses;Other  (Comment) Penetration/Aspiration details (honey cup): Material enters  airway, remains ABOVE vocal cords and not ejected out Pharyngeal - Nectar Pharyngeal - Nectar Teaspoon: Not tested Pharyngeal - Nectar Cup: Delayed swallow initiation;Reduced  epiglottic inversion;Reduced laryngeal elevation;Reduced  airway/laryngeal closure;Reduced tongue base  retraction;Penetration/Aspiration during  swallow;Penetration/Aspiration after swallow;Pharyngeal residue -  valleculae;Pharyngeal residue - pyriform sinuses;Compensatory  strategies attempted (Comment) (chin tuck increases amount of  penetration) Penetration/Aspiration details (nectar cup): Material enters  airway, passes BELOW cords without attempt by patient to eject  out (silent aspiration) Pharyngeal - Thin Pharyngeal - Thin Teaspoon: Not tested Pharyngeal - Thin Cup: Delayed swallow initiation;Reduced  epiglottic inversion;Reduced laryngeal elevation;Reduced  airway/laryngeal closure;Reduced tongue base  retraction;Penetration/Aspiration during  swallow;Penetration/Aspiration after swallow;Pharyngeal residue -  valleculae;Pharyngeal residue - pyriform sinuses Penetration/Aspiration details (thin cup): Material enters  airway, remains ABOVE vocal cords and not ejected out Pharyngeal - Solids Pharyngeal - Puree: Delayed swallow initiation;Reduced epiglottic  inversion;Reduced laryngeal elevation;Reduced airway/laryngeal  closure;Reduced tongue base retraction;Pharyngeal residue -  valleculae;Pharyngeal residue - pyriform sinuses Pharyngeal - Mechanical Soft: Delayed swallow initiation;Reduced  epiglottic inversion;Reduced laryngeal elevation;Reduced  airway/laryngeal closure;Reduced tongue base  retraction;Pharyngeal residue - valleculae;Pharyngeal residue -  pyriform sinuses Pharyngeal - Regular: Not tested   Cervical Esophageal Phase  Cervical Esophageal Phase Cervical Esophageal Phase: Impaired Cervical Esophageal Phase - Comment Cervical Esophageal  Comment: decreased clearance due to suspected  osteophytes   GN         Germain Osgood, M.A. CCC-SLP [phone]  Germain Osgood 06/30/2014, 1:14 PM  Assessment: 1. Probable lower GI bleed previously on anticoagulants, unclear whether passing old blood or still bleeding MiraLAX ordered. Some drop in hemoglobin. BUN is normal. Most recent colonoscopy 2011 showing an adenomatous polyp and diverticulosis  Plan: 1. We'll continue to manage conservatively for now with anticoagulants held , presume this is diverticular bleed. 2. Continue to monitor stools and hemoglobin, if bleeding does not stop consider colonoscopy or nuclear medicine tagged RBC study.   Kennth Vanbenschoten C 07/15/2014, 8:46 AM

## 2014-07-01 NOTE — H&P (Signed)
PULMONARY / CRITICAL CARE MEDICINE   Name: Chad Morse MRN: [id number] DOB: Apr 12, 1941    ADMISSION DATE:  06/21/2014 CONSULTATION DATE:  06/30/2014  REFERRING MD :  Dava Najjar  CHIEF COMPLAINT:  Hemorrhagic shock and GI bleeding  INITIAL PRESENTATION: 73 year old male with extensive medical history presented with seizure, was originally in the ICU, was discharged to rehab where he was on eliquis for a-fib.  He suffered a minor GI bleeding evidently earlier and GI evaluated the patient.  Subsequently, GI bleeding worsened and patient became hypotensive.  PCCM was called to admit patient to the ICU and Dr. Karleen Hampshire communicated with Dr. Wallis Mart who will be taking the patient to the endoscopy lab.  STUDIES:  10/16 endoscopy  SIGNIFICANT EVENTS: 10/16 GI bleeding requiring ICU admission for hemorrhagic shock.  PAST MEDICAL HISTORY :   has a past medical history of COPD (chronic obstructive pulmonary disease); Diabetes mellitus; Atrial fibrillation; Peripheral neuropathy; Gallstones; Peripheral vascular disease; IBS (irritable bowel syndrome); Peripheral arterial disease; Tobacco abuse; Hypertension; and Hyperlipidemia.  has past surgical history that includes Back surgery. Prior to Admission medications   Medication Sig Start Date End Date Taking? Authorizing Provider  allopurinol (ZYLOPRIM) 100 MG tablet Take 100 mg by mouth daily.    Historical Provider, MD  ALPRAZolam Duanne Moron) 1 MG tablet Take 1 mg by mouth daily as needed for anxiety or sleep.  12/25/12   Historical Provider, MD  apixaban (ELIQUIS) 5 MG TABS tablet Take 1 tablet (5 mg total) by mouth 2 (two) times daily. 03/28/14   Lorretta Harp, MD  baclofen (LIORESAL) 20 MG tablet Take 10-20 mg by mouth 2 (two) times daily. Take 10 mg morning, take 20 mg noon, 10 mg at 6 pm and 20 mg 11 pm.    Historical Provider, MD  cefUROXime (CEFTIN) 250 MG tablet Take 1 tablet (250 mg total) by mouth 2 (two) times daily with a meal. 05/24/14   Dorie Rank, MD  cilostazol (PLETAL) 50 MG tablet Take 50 mg by mouth 2 (two) times daily.  01/04/13   Historical Provider, MD  diltiazem (CARDIZEM CD) 120 MG 24 hr capsule Take 1 capsule (120 mg total) by mouth daily. 12/07/13   Lorretta Harp, MD  folic acid (FOLVITE) 1 MG tablet Take 1 mg by mouth daily.    Historical Provider, MD  Folic Acid-Vit B4-WHQ P59 (FOLBEE) 2.5-25-1 MG TABS tablet Take 1 tablet by mouth daily. 11/16/13   Lorretta Harp, MD  furosemide (LASIX) 40 MG tablet Take 40 mg by mouth daily.    Historical Provider, MD  HYDROcodone-acetaminophen (NORCO) 10-325 MG per tablet Take 1 tablet by mouth 3 (three) times daily. For pain    Historical Provider, MD  KLOR-CON M20 20 MEQ tablet Take 20 mEq by mouth daily.  12/10/12   Historical Provider, MD  metFORMIN (GLUCOPHAGE) 500 MG tablet Take 500 mg by mouth 2 (two) times daily.  04/25/14   Historical Provider, MD  metoprolol (LOPRESSOR) 50 MG tablet Take 25 mg by mouth every evening.     Historical Provider, MD  mupirocin cream (BACTROBAN) 2 % Apply 1 application topically as needed (for skin irritation).  09/01/13   Historical Provider, MD  ondansetron (ZOFRAN) 4 MG tablet Take 1 tablet (4 mg total) by mouth every 6 (six) hours. 05/24/14   Dorie Rank, MD  pantoprazole (PROTONIX) 40 MG tablet Take 1 tablet (40 mg total) by mouth daily. 10/13/13   Lorretta Harp, MD  ramipril (  ALTACE) 2.5 MG capsule Take 1 capsule (2.5 mg total) by mouth daily. 10/18/13   Lorretta Harp, MD  simvastatin (ZOCOR) 20 MG tablet Take 1 tablet (20 mg total) by mouth every evening. 11/24/13   Lorretta Harp, MD  SPIRIVA HANDIHALER 18 MCG inhalation capsule Place 18 mcg into inhaler and inhale daily as needed (for shortness of breath).     Historical Provider, MD  tiZANidine (ZANAFLEX) 4 MG tablet Take 2-4 mg by mouth 4 (four) times daily. 6 am- half tab 11 am- 1 tab 6 pm- half tab 11 pm- 2 tabs 07/06/13   Antony Contras, MD  traZODone (DESYREL) 50 MG tablet Take 100 mg  by mouth at bedtime.  06/29/13   Historical Provider, MD  zolpidem (AMBIEN CR) 12.5 MG CR tablet Take 12.5 mg by mouth daily. 06/29/13   Historical Provider, MD   Allergies  Allergen Reactions  . Ciprofloxacin Hives  . Penicillins Hives  . Sulfonamide Derivatives Hives    FAMILY HISTORY:  indicated that his mother is deceased. He indicated that his father is deceased. He indicated that his brother is alive.  SOCIAL HISTORY:  reports that he has been smoking Cigarettes.  He has been smoking about 0.00 packs per day for the past 60 years. He has never used smokeless tobacco. He reports that he does not drink alcohol or use illicit drugs.  REVIEW OF SYSTEMS:  Patient is confused and not answering questions.  SUBJECTIVE:   VITAL SIGNS: Temp:  [97.6 F (36.4 C)-98.2 F (36.8 C)] 97.6 F (36.4 C) (10/16 1611) Pulse Rate:  [43-89] 73 (10/16 1635) Resp:  [18-19] 19 (10/16 0534) BP: (77-115)/(30-65) 91/48 mmHg (10/16 1635) SpO2:  [84 %-100 %] 100 % (10/16 1635) HEMODYNAMICS:   VENTILATOR SETTINGS:   INTAKE / OUTPUT:  Intake/Output Summary (Last 24 hours) at 07/10/2014 1641 Last data filed at 06/28/2014 1600  Gross per 24 hour  Intake 1502.5 ml  Output    125 ml  Net 1377.5 ml    PHYSICAL EXAMINATION: General:  Chronically ill appearing male, confused but moving all ext to command. Neuro:  Awake but not oriented, moving all ext to command, lethargic. HEENT:  [place]/AT, PERRL, EOM-I and DMM. Cardiovascular:  IRIR, Nl S1/S2, -M/R/G. Lungs:  Mild end exp wheezes but RR in the high 20's. Abdomen:  Soft, NT, ND and +BS. Musculoskeletal:  -edema and -tenderness. Skin:  Thin but intact.  LABS:  CBC  Recent Labs Lab 06/30/14 2200 06/25/2014 0534 07/10/2014 0945  WBC 21.8* 22.2* 22.8*  HGB 10.0* 9.0* 8.1*  HCT 29.6* 26.9* 24.9*  PLT 307 295 309   Coag's No results found for this basename: APTT, INR,  in the last 168 hours BMET  Recent Labs Lab 06/25/14 0500 06/27/14 0450  06/29/14 0609  NA 136* 137 139  K 3.4* 4.1 4.0  CL 95* 98 94*  CO2 33* 30 33*  BUN 10 14 15   CREATININE 0.55 0.60 0.64  GLUCOSE 123* 133* 141*   Electrolytes  Recent Labs Lab 06/25/14 0500 06/27/14 0450 06/29/14 0609  CALCIUM 7.7* 8.3* 9.1  MG  --   --  1.6   Sepsis Markers No results found for this basename: LATICACIDVEN, PROCALCITON, O2SATVEN,  in the last 168 hours ABG No results found for this basename: PHART, PCO2ART, PO2ART,  in the last 168 hours Liver Enzymes  Recent Labs Lab 06/29/14 0609  AST 51*  ALT 95*  ALKPHOS 105  BILITOT 0.5  ALBUMIN 2.8*  Cardiac Enzymes No results found for this basename: TROPONINI, PROBNP,  in the last 168 hours Glucose  Recent Labs Lab 06/30/14 0741 06/30/14 1121 06/30/14 1638 06/30/14 2108 07/16/2014 0710 06/20/2014 1113  GLUCAP 132* 148* 154* 160* 128* 142*    Imaging Dg Chest 2 View  06/30/2014   CLINICAL DATA:  COPD with acute onset of difficulty breathing  EXAM: CHEST  2 VIEW  COMPARISON:  June 17, 2014  FINDINGS: There is underlying emphysematous change. There is no apparent edema or consolidation. The heart size is within normal limits. Pulmonary vascularity is within normal limits. No adenopathy. There is degenerative change in the thoracic spine.  On the lateral view, the stomach appears distended with fluid and air.  IMPRESSION: Underlying emphysema.  No edema or consolidation.  On lateral view, stomach appears distended with fluid and air. Question a degree of underlying ileus. This finding may warrant abdominal radiographs for further assessment.   Electronically Signed   By: Lowella Grip M.D.   On: 06/30/2014 09:30   Dg Swallowing Func-speech Pathology  06/30/2014   Loletha Grayer, CCC-SLP     06/30/2014  1:16 PM   Objective Swallowing Evaluation: Modified Barium Swallowing Study   Patient Details  Name: Chad Morse MRN: [id number] Date of Birth: 06-18-1941  Today's Date: 06/30/2014 Time:   0930-1000    Past Medical History:  Past Medical History  Diagnosis Date  . COPD (chronic obstructive pulmonary disease)   . Diabetes mellitus   . Atrial fibrillation   . Peripheral neuropathy   . Gallstones   . Peripheral vascular disease   . IBS (irritable bowel syndrome)   . Peripheral arterial disease   . Tobacco abuse   . Hypertension   . Hyperlipidemia    Past Surgical History:  Past Surgical History  Procedure Laterality Date  . Back surgery     HPI:  73 y/o male with PMH: COPD, Afib, DM, PVD, IBS, HTN admitted  after fall, struck his head with brief unresponsiveness.  Had  seizure in ED and was intubated.  Per MD note pt has had very  poor PO intake and has complained of his vision being blurry.  CT  negative for new abnormality, remote right basal ganglia and left  cerebelluym.  Extubated 9/29 and reintubaed 9/29-10/02. MRI  revealed chronic changes as described. No acute intracranial  findings. CXR cardiomegaly with bilateral pulmonary infiltrates  and small pleural effusions most consistent with congestive heart  failure.  Pt. continued to exhibit pharyngeal dysphagia,  therefore, MBS was performed 06/20/14 with recommendations for Dys  1 textures and pudding thick liquids.     Recommendation/Prognosis  Clinical Impression:   Dysphagia Diagnosis: Moderate oral phase dysphagia;Moderate  pharyngeal phase dysphagia  Clinical impression: Pt continues to exhibit a moderate  oropharyngeal dysphagia that is sensorimotor and anatomically  based. Suspected osteophytes (MD not present to confirm) impinge  on the pharyngeal space, which interferes with epiglottic  deflection and consequently airway protection, as well as  clearance through the UES. Pharyngeal weakness contributes to  residuals as well, with multiple swallows needed to facilitate  clearance. Pt penetrated nectar thick and thin liquids during the  swallow due to a delay in initiation coupled with decreased  airway seal, and all liquid consistencies are  penetrated after  the swallow on residuals. Pt has difficulty clearing penetrates  because of a weakened cough, with nectar thick penetrates  observed to pass below the voal cords. Honey thick liquid  residuals  result in trace, more shallow penetration. Recommend to  initiate Dys 2 textures and honey thick liquids via tsp to reduce  the amount of residuals and increase airway protection.   Swallow Evaluation Recommendations:  Diet Recommendations: Honey-thick liquid;Dysphagia 2 (Fine chop) Liquid Administration via: Spoon Medication Administration: Crushed with puree Supervision: Patient able to self feed;Full supervision/cueing  for compensatory strategies Compensations: Slow rate;Small sips/bites;Clear throat after each  swallow;Multiple dry swallows after each bite/sip Postural Changes and/or Swallow Maneuvers: Seated upright 90  degrees;Upright 30-60 min after meal Oral Care Recommendations: Oral care BID Other Recommendations: Order thickener from pharmacy;Prohibited  food (jello, ice cream, thin soups);Remove water pitcher Follow up Recommendations: Home health SLP;24 hour  supervision/assistance    Prognosis:  Prognosis for Safe Diet Advancement: Good Barriers to Reach Goals: Cognitive deficits (structural  component)   Individuals Consulted: Consulted and Agree with Results and  Recommendations: Patient;RN;PA      SLP Assessment/Plan  Plan:  Potential to Achieve Goals: Good Potential Considerations: Ability to learn/carryover  information;Co-morbidities;Previous level of function   Short Term Goals: Week 2: SLP Short Term Goal 1 (Week 2): Patient  will utilize external visual aids to recall new, daily  information with Mod A multimodal cues.   SLP Short Term Goal 2 (Week 2): Patient will demonstrate  functional problem solving for basic and familiar tasks with Min  A multimodal cues. SLP Short Term Goal 3 (Week 2): Patient will sustain attention to  basic and familiar tasks for 10 minutes with Min A  multimodal  cues. SLP Short Term Goal 4 (Week 2): Patient will identify 1 cognitive  and 1 physical deficit with Mod A multimodal cues.  SLP Short Term Goal 5 (Week 2): Patient will utilize swallowing  compensatory strategies with least restrictive diet to minimize  overt s/s of aspiration with Mod A multimodal cues.  SLP Short Term Goal 6 (Week 2): Patient will orient to time,  place, person and situation with Min A multimodal cues.     General: Date of Onset: 06/11/14 Type of Study: Modified Barium Swallowing Study Reason for Referral: Objectively evaluate swallowing function Previous Swallow Assessment: MBS 06/20/14 Diet Prior to this Study: Dysphagia 1 (puree);Pudding-thick  liquids Temperature Spikes Noted: No Respiratory Status: Room air History of Recent Intubation: Yes Length of Intubations (days): 4 days Date extubated: 06/17/14 Behavior/Cognition: Alert;Cooperative;Pleasant  mood;Impulsive;Requires cueing Oral Cavity - Dentition: Dentures, not available;Edentulous Oral Motor / Sensory Function: Impaired - see Bedside swallow  eval Self-Feeding Abilities: Able to feed self Patient Positioning: Upright in chair Baseline Vocal Quality: Clear Volitional Cough: Weak Volitional Swallow: Unable to elicit Anatomy: Other (Comment) (suspected osteophytes along cervical  spine impinging on pharyngeal space) Pharyngeal Secretions: Not observed secondary MBS   Reason for Referral:   Objectively evaluate swallowing function    Oral Phase: Oral Preparation/Oral Phase Oral Phase: Impaired Oral - Honey Oral - Honey Cup: Weak lingual manipulation;Lingual  pumping;Incomplete tongue to palate contact;Reduced posterior  propulsion;Delayed oral transit;Other (Comment) (decreased bolus  cohesion) Oral - Nectar Oral - Nectar Cup: Weak lingual manipulation;Lingual  pumping;Incomplete tongue to palate contact;Reduced posterior  propulsion;Delayed oral transit;Other (Comment) (decreased bolus  cohesion) Oral - Thin Oral - Thin Cup:  Weak lingual manipulation;Lingual  pumping;Incomplete tongue to palate contact;Reduced posterior  propulsion;Delayed oral transit;Other (Comment) (decreased bolus  cohesion) Oral - Solids Oral - Puree: Weak lingual manipulation;Lingual  pumping;Incomplete tongue to palate contact;Reduced posterior  propulsion;Delayed oral transit;Other (Comment) (decreased bolus  cohesion) Oral - Mechanical Soft: Weak lingual manipulation;Lingual  pumping;Incomplete tongue to palate contact;Reduced posterior  propulsion;Delayed oral transit;Other (Comment) (decreased bolus  cohesion) Oral - Regular: Not tested   Pharyngeal Phase:  Pharyngeal Phase Pharyngeal Phase: Impaired Pharyngeal - Honey Pharyngeal - Honey Teaspoon: Not tested Pharyngeal - Honey Cup: Delayed swallow initiation;Reduced  epiglottic inversion;Reduced laryngeal elevation;Reduced  airway/laryngeal closure;Reduced tongue base  retraction;Penetration/Aspiration after swallow;Pharyngeal  residue - valleculae;Pharyngeal residue - pyriform sinuses;Other  (Comment) Penetration/Aspiration details (honey cup): Material enters  airway, remains ABOVE vocal cords and not ejected out Pharyngeal - Nectar Pharyngeal - Nectar Teaspoon: Not tested Pharyngeal - Nectar Cup: Delayed swallow initiation;Reduced  epiglottic inversion;Reduced laryngeal elevation;Reduced  airway/laryngeal closure;Reduced tongue base  retraction;Penetration/Aspiration during  swallow;Penetration/Aspiration after swallow;Pharyngeal residue -  valleculae;Pharyngeal residue - pyriform sinuses;Compensatory  strategies attempted (Comment) (chin tuck increases amount of  penetration) Penetration/Aspiration details (nectar cup): Material enters  airway, passes BELOW cords without attempt by patient to eject  out (silent aspiration) Pharyngeal - Thin Pharyngeal - Thin Teaspoon: Not tested Pharyngeal - Thin Cup: Delayed swallow initiation;Reduced  epiglottic inversion;Reduced laryngeal elevation;Reduced   airway/laryngeal closure;Reduced tongue base  retraction;Penetration/Aspiration during  swallow;Penetration/Aspiration after swallow;Pharyngeal residue -  valleculae;Pharyngeal residue - pyriform sinuses Penetration/Aspiration details (thin cup): Material enters  airway, remains ABOVE vocal cords and not ejected out Pharyngeal - Solids Pharyngeal - Puree: Delayed swallow initiation;Reduced epiglottic  inversion;Reduced laryngeal elevation;Reduced airway/laryngeal  closure;Reduced tongue base retraction;Pharyngeal residue -  valleculae;Pharyngeal residue - pyriform sinuses Pharyngeal - Mechanical Soft: Delayed swallow initiation;Reduced  epiglottic inversion;Reduced laryngeal elevation;Reduced  airway/laryngeal closure;Reduced tongue base  retraction;Pharyngeal residue - valleculae;Pharyngeal residue -  pyriform sinuses Pharyngeal - Regular: Not tested   Cervical Esophageal Phase  Cervical Esophageal Phase Cervical Esophageal Phase: Impaired Cervical Esophageal Phase - Comment Cervical Esophageal Comment: decreased clearance due to suspected  osteophytes   GN         Germain Osgood, M.A. CCC-SLP [phone]  Germain Osgood 06/30/2014, 1:14 PM                      ASSESSMENT / PLAN:  PULMONARY OETT None A: COPD history, I am concerned that with large volume may require decompensates.  But no need for intubation as of now. P:   - Titrate O2 for sats. - Spiriva as ordered. - PRN albuterol. - Monitor for signs of respiratory failure closely in the ICU.  CARDIOVASCULAR CVL PIV A: Hemorrhagic shock, responding to IVF so far.  After one liter is at 90/50. P:  - Continue NS. - Caution with volume overload, would d/c IVF immediately once BP is more stable. - Echo noted. - Hold further anti-coagulation for now. - Hold beta blocker and dilt given hypotension.  RENAL A:  No active issues but last BMET is 10/14. P:   - BMET now. - BMET in AM. - Replace electrolytes as  indicated.  GASTROINTESTINAL A:  GI Bleed.  GI aware. P:   - To endoscopy lab now. - Caution with sedation as I foresee respiratory failure if over sedated.  HEMATOLOGIC A:  Hg steadily dropping with GI bleeding and WBC is elevated but suspect a stress response as CXR is not impressive and patient is afebrile.  Has been on cefuroxime but unable to determine why. P:  - T&S - H&H q6 and transfuse if <8 given GI bleeding. - Will transfuse a unit now. - Was not on eliquis but will check coags now. - Correct coags as indicated.  INFECTIOUS A:  Cefuroxime for unknown cause to me at this  point but given hypotension will continue. P:   BCx2 10/16>>> UC 10/16>>> Sputum 10/16>>> Abx: Cefuroxime, start date 10/14>>> F/U on cultures, if higher suspicion for infection then will broaden spectrum.  ENDOCRINE A:  DM and hypotension.   P:   - CBGs. - ISS. - Stress dose steroids. - Check cortisol level.  NEUROLOGIC A:  Sz history but not on medications as of now, AMS due to hypotension, fluctuates with BP. P:   - Hold sz medications. - Maintain BP. - Minimize sedation.  Family updated: Wife updated bedside.  Interdisciplinary Family Meeting:   TODAY'S SUMMARY: 73 year old male with extensive PMH presenting to the ICU from rehab for GI bleeding and hemorrhagic shock.  Will transfuse one unit and keep target at 8 given active GI bleed.  Dr. Karleen Hampshire communicated with Dr. Wallis Mart who will take patient to endoscopy.  Will admit to the ICU for close observation.  Minimize sedation.  Further plan as above.  I have personally obtained a history, examined the patient, evaluated laboratory and imaging results, formulated the assessment and plan and placed orders. CRITICAL CARE: The patient is critically ill with multiple organ systems failure and requires high complexity decision making for assessment and support, frequent evaluation and titration of therapies, application of advanced monitoring  technologies and extensive interpretation of multiple databases. Critical Care Time devoted to patient care services described in this note is 45 minutes.   Rush Farmer, M.D. [facility] Pulmonary/Critical Care Medicine. Pager: [phone]. After hours pager: [phone].  07/09/2014, 4:41 PM

## 2014-07-01 NOTE — H&P (View-Only) (Signed)
PULMONARY / CRITICAL CARE MEDICINE   Name: KONNAR BEN MRN: [id number] DOB: 07/29/1941    ADMISSION DATE:  06/21/2014 CONSULTATION DATE:  06/30/2014  REFERRING MD :  Dava Najjar  CHIEF COMPLAINT:  Hemorrhagic shock and GI bleeding  INITIAL PRESENTATION: 73 year old male with extensive medical history presented with seizure, was originally in the ICU, was discharged to rehab where he was on eliquis for a-fib.  He suffered a minor GI bleeding evidently earlier and GI evaluated the patient.  Subsequently, GI bleeding worsened and patient became hypotensive.  PCCM was called to admit patient to the ICU and Dr. Karleen Hampshire communicated with Dr. Wallis Mart who will be taking the patient to the endoscopy lab.  STUDIES:  10/16 endoscopy  SIGNIFICANT EVENTS: 10/16 GI bleeding requiring ICU admission for hemorrhagic shock.  PAST MEDICAL HISTORY :   has a past medical history of COPD (chronic obstructive pulmonary disease); Diabetes mellitus; Atrial fibrillation; Peripheral neuropathy; Gallstones; Peripheral vascular disease; IBS (irritable bowel syndrome); Peripheral arterial disease; Tobacco abuse; Hypertension; and Hyperlipidemia.  has past surgical history that includes Back surgery. Prior to Admission medications   Medication Sig Start Date End Date Taking? Authorizing Provider  allopurinol (ZYLOPRIM) 100 MG tablet Take 100 mg by mouth daily.    Historical Provider, MD  ALPRAZolam Duanne Moron) 1 MG tablet Take 1 mg by mouth daily as needed for anxiety or sleep.  12/25/12   Historical Provider, MD  apixaban (ELIQUIS) 5 MG TABS tablet Take 1 tablet (5 mg total) by mouth 2 (two) times daily. 03/28/14   Lorretta Harp, MD  baclofen (LIORESAL) 20 MG tablet Take 10-20 mg by mouth 2 (two) times daily. Take 10 mg morning, take 20 mg noon, 10 mg at 6 pm and 20 mg 11 pm.    Historical Provider, MD  cefUROXime (CEFTIN) 250 MG tablet Take 1 tablet (250 mg total) by mouth 2 (two) times daily with a meal. 05/24/14   Dorie Rank, MD  cilostazol (PLETAL) 50 MG tablet Take 50 mg by mouth 2 (two) times daily.  01/04/13   Historical Provider, MD  diltiazem (CARDIZEM CD) 120 MG 24 hr capsule Take 1 capsule (120 mg total) by mouth daily. 12/07/13   Lorretta Harp, MD  folic acid (FOLVITE) 1 MG tablet Take 1 mg by mouth daily.    Historical Provider, MD  Folic Acid-Vit W1-UUV O53 (FOLBEE) 2.5-25-1 MG TABS tablet Take 1 tablet by mouth daily. 11/16/13   Lorretta Harp, MD  furosemide (LASIX) 40 MG tablet Take 40 mg by mouth daily.    Historical Provider, MD  HYDROcodone-acetaminophen (NORCO) 10-325 MG per tablet Take 1 tablet by mouth 3 (three) times daily. For pain    Historical Provider, MD  KLOR-CON M20 20 MEQ tablet Take 20 mEq by mouth daily.  12/10/12   Historical Provider, MD  metFORMIN (GLUCOPHAGE) 500 MG tablet Take 500 mg by mouth 2 (two) times daily.  04/25/14   Historical Provider, MD  metoprolol (LOPRESSOR) 50 MG tablet Take 25 mg by mouth every evening.     Historical Provider, MD  mupirocin cream (BACTROBAN) 2 % Apply 1 application topically as needed (for skin irritation).  09/01/13   Historical Provider, MD  ondansetron (ZOFRAN) 4 MG tablet Take 1 tablet (4 mg total) by mouth every 6 (six) hours. 05/24/14   Dorie Rank, MD  pantoprazole (PROTONIX) 40 MG tablet Take 1 tablet (40 mg total) by mouth daily. 10/13/13   Lorretta Harp, MD  ramipril (  ALTACE) 2.5 MG capsule Take 1 capsule (2.5 mg total) by mouth daily. 10/18/13   Lorretta Harp, MD  simvastatin (ZOCOR) 20 MG tablet Take 1 tablet (20 mg total) by mouth every evening. 11/24/13   Lorretta Harp, MD  SPIRIVA HANDIHALER 18 MCG inhalation capsule Place 18 mcg into inhaler and inhale daily as needed (for shortness of breath).     Historical Provider, MD  tiZANidine (ZANAFLEX) 4 MG tablet Take 2-4 mg by mouth 4 (four) times daily. 6 am- half tab 11 am- 1 tab 6 pm- half tab 11 pm- 2 tabs 07/06/13   Antony Contras, MD  traZODone (DESYREL) 50 MG tablet Take 100 mg  by mouth at bedtime.  06/29/13   Historical Provider, MD  zolpidem (AMBIEN CR) 12.5 MG CR tablet Take 12.5 mg by mouth daily. 06/29/13   Historical Provider, MD   Allergies  Allergen Reactions  . Ciprofloxacin Hives  . Penicillins Hives  . Sulfonamide Derivatives Hives    FAMILY HISTORY:  indicated that his mother is deceased. He indicated that his father is deceased. He indicated that his brother is alive.  SOCIAL HISTORY:  reports that he has been smoking Cigarettes.  He has been smoking about 0.00 packs per day for the past 60 years. He has never used smokeless tobacco. He reports that he does not drink alcohol or use illicit drugs.  REVIEW OF SYSTEMS:  Patient is confused and not answering questions.  SUBJECTIVE:   VITAL SIGNS: Temp:  [97.6 F (36.4 C)-98.2 F (36.8 C)] 97.6 F (36.4 C) (10/16 1611) Pulse Rate:  [43-89] 73 (10/16 1635) Resp:  [18-19] 19 (10/16 0534) BP: (77-115)/(30-65) 91/48 mmHg (10/16 1635) SpO2:  [84 %-100 %] 100 % (10/16 1635) HEMODYNAMICS:   VENTILATOR SETTINGS:   INTAKE / OUTPUT:  Intake/Output Summary (Last 24 hours) at 06/30/2014 1641 Last data filed at 07/15/2014 1600  Gross per 24 hour  Intake 1502.5 ml  Output    125 ml  Net 1377.5 ml    PHYSICAL EXAMINATION: General:  Chronically ill appearing male, confused but moving all ext to command. Neuro:  Awake but not oriented, moving all ext to command, lethargic. HEENT:  [place]/AT, PERRL, EOM-I and DMM. Cardiovascular:  IRIR, Nl S1/S2, -M/R/G. Lungs:  Mild end exp wheezes but RR in the high 20's. Abdomen:  Soft, NT, ND and +BS. Musculoskeletal:  -edema and -tenderness. Skin:  Thin but intact.  LABS:  CBC  Recent Labs Lab 06/30/14 2200 07/05/2014 0534 06/16/2014 0945  WBC 21.8* 22.2* 22.8*  HGB 10.0* 9.0* 8.1*  HCT 29.6* 26.9* 24.9*  PLT 307 295 309   Coag's No results found for this basename: APTT, INR,  in the last 168 hours BMET  Recent Labs Lab 06/25/14 0500 06/27/14 0450  06/29/14 0609  NA 136* 137 139  K 3.4* 4.1 4.0  CL 95* 98 94*  CO2 33* 30 33*  BUN 10 14 15   CREATININE 0.55 0.60 0.64  GLUCOSE 123* 133* 141*   Electrolytes  Recent Labs Lab 06/25/14 0500 06/27/14 0450 06/29/14 0609  CALCIUM 7.7* 8.3* 9.1  MG  --   --  1.6   Sepsis Markers No results found for this basename: LATICACIDVEN, PROCALCITON, O2SATVEN,  in the last 168 hours ABG No results found for this basename: PHART, PCO2ART, PO2ART,  in the last 168 hours Liver Enzymes  Recent Labs Lab 06/29/14 0609  AST 51*  ALT 95*  ALKPHOS 105  BILITOT 0.5  ALBUMIN 2.8*  Cardiac Enzymes No results found for this basename: TROPONINI, PROBNP,  in the last 168 hours Glucose  Recent Labs Lab 06/30/14 0741 06/30/14 1121 06/30/14 1638 06/30/14 2108 06/30/2014 0710 06/24/2014 1113  GLUCAP 132* 148* 154* 160* 128* 142*    Imaging Dg Chest 2 View  06/30/2014   CLINICAL DATA:  COPD with acute onset of difficulty breathing  EXAM: CHEST  2 VIEW  COMPARISON:  June 17, 2014  FINDINGS: There is underlying emphysematous change. There is no apparent edema or consolidation. The heart size is within normal limits. Pulmonary vascularity is within normal limits. No adenopathy. There is degenerative change in the thoracic spine.  On the lateral view, the stomach appears distended with fluid and air.  IMPRESSION: Underlying emphysema.  No edema or consolidation.  On lateral view, stomach appears distended with fluid and air. Question a degree of underlying ileus. This finding may warrant abdominal radiographs for further assessment.   Electronically Signed   By: Lowella Grip M.D.   On: 06/30/2014 09:30   Dg Swallowing Func-speech Pathology  06/30/2014   Loletha Grayer, CCC-SLP     06/30/2014  1:16 PM   Objective Swallowing Evaluation: Modified Barium Swallowing Study   Patient Details  Name: Chad Morse MRN: [id number] Date of Birth: 11-24-1940  Today's Date: 06/30/2014 Time:   0930-1000    Past Medical History:  Past Medical History  Diagnosis Date  . COPD (chronic obstructive pulmonary disease)   . Diabetes mellitus   . Atrial fibrillation   . Peripheral neuropathy   . Gallstones   . Peripheral vascular disease   . IBS (irritable bowel syndrome)   . Peripheral arterial disease   . Tobacco abuse   . Hypertension   . Hyperlipidemia    Past Surgical History:  Past Surgical History  Procedure Laterality Date  . Back surgery     HPI:  73 y/o male with PMH: COPD, Afib, DM, PVD, IBS, HTN admitted  after fall, struck his head with brief unresponsiveness.  Had  seizure in ED and was intubated.  Per MD note pt has had very  poor PO intake and has complained of his vision being blurry.  CT  negative for new abnormality, remote right basal ganglia and left  cerebelluym.  Extubated 9/29 and reintubaed 9/29-10/02. MRI  revealed chronic changes as described. No acute intracranial  findings. CXR cardiomegaly with bilateral pulmonary infiltrates  and small pleural effusions most consistent with congestive heart  failure.  Pt. continued to exhibit pharyngeal dysphagia,  therefore, MBS was performed 06/20/14 with recommendations for Dys  1 textures and pudding thick liquids.     Recommendation/Prognosis  Clinical Impression:   Dysphagia Diagnosis: Moderate oral phase dysphagia;Moderate  pharyngeal phase dysphagia  Clinical impression: Pt continues to exhibit a moderate  oropharyngeal dysphagia that is sensorimotor and anatomically  based. Suspected osteophytes (MD not present to confirm) impinge  on the pharyngeal space, which interferes with epiglottic  deflection and consequently airway protection, as well as  clearance through the UES. Pharyngeal weakness contributes to  residuals as well, with multiple swallows needed to facilitate  clearance. Pt penetrated nectar thick and thin liquids during the  swallow due to a delay in initiation coupled with decreased  airway seal, and all liquid consistencies are  penetrated after  the swallow on residuals. Pt has difficulty clearing penetrates  because of a weakened cough, with nectar thick penetrates  observed to pass below the voal cords. Honey thick liquid  residuals  result in trace, more shallow penetration. Recommend to  initiate Dys 2 textures and honey thick liquids via tsp to reduce  the amount of residuals and increase airway protection.   Swallow Evaluation Recommendations:  Diet Recommendations: Honey-thick liquid;Dysphagia 2 (Fine chop) Liquid Administration via: Spoon Medication Administration: Crushed with puree Supervision: Patient able to self feed;Full supervision/cueing  for compensatory strategies Compensations: Slow rate;Small sips/bites;Clear throat after each  swallow;Multiple dry swallows after each bite/sip Postural Changes and/or Swallow Maneuvers: Seated upright 90  degrees;Upright 30-60 min after meal Oral Care Recommendations: Oral care BID Other Recommendations: Order thickener from pharmacy;Prohibited  food (jello, ice cream, thin soups);Remove water pitcher Follow up Recommendations: Home health SLP;24 hour  supervision/assistance    Prognosis:  Prognosis for Safe Diet Advancement: Good Barriers to Reach Goals: Cognitive deficits (structural  component)   Individuals Consulted: Consulted and Agree with Results and  Recommendations: Patient;RN;PA      SLP Assessment/Plan  Plan:  Potential to Achieve Goals: Good Potential Considerations: Ability to learn/carryover  information;Co-morbidities;Previous level of function   Short Term Goals: Week 2: SLP Short Term Goal 1 (Week 2): Patient  will utilize external visual aids to recall new, daily  information with Mod A multimodal cues.   SLP Short Term Goal 2 (Week 2): Patient will demonstrate  functional problem solving for basic and familiar tasks with Min  A multimodal cues. SLP Short Term Goal 3 (Week 2): Patient will sustain attention to  basic and familiar tasks for 10 minutes with Min A  multimodal  cues. SLP Short Term Goal 4 (Week 2): Patient will identify 1 cognitive  and 1 physical deficit with Mod A multimodal cues.  SLP Short Term Goal 5 (Week 2): Patient will utilize swallowing  compensatory strategies with least restrictive diet to minimize  overt s/s of aspiration with Mod A multimodal cues.  SLP Short Term Goal 6 (Week 2): Patient will orient to time,  place, person and situation with Min A multimodal cues.     General: Date of Onset: 06/11/14 Type of Study: Modified Barium Swallowing Study Reason for Referral: Objectively evaluate swallowing function Previous Swallow Assessment: MBS 06/20/14 Diet Prior to this Study: Dysphagia 1 (puree);Pudding-thick  liquids Temperature Spikes Noted: No Respiratory Status: Room air History of Recent Intubation: Yes Length of Intubations (days): 4 days Date extubated: 06/17/14 Behavior/Cognition: Alert;Cooperative;Pleasant  mood;Impulsive;Requires cueing Oral Cavity - Dentition: Dentures, not available;Edentulous Oral Motor / Sensory Function: Impaired - see Bedside swallow  eval Self-Feeding Abilities: Able to feed self Patient Positioning: Upright in chair Baseline Vocal Quality: Clear Volitional Cough: Weak Volitional Swallow: Unable to elicit Anatomy: Other (Comment) (suspected osteophytes along cervical  spine impinging on pharyngeal space) Pharyngeal Secretions: Not observed secondary MBS   Reason for Referral:   Objectively evaluate swallowing function    Oral Phase: Oral Preparation/Oral Phase Oral Phase: Impaired Oral - Honey Oral - Honey Cup: Weak lingual manipulation;Lingual  pumping;Incomplete tongue to palate contact;Reduced posterior  propulsion;Delayed oral transit;Other (Comment) (decreased bolus  cohesion) Oral - Nectar Oral - Nectar Cup: Weak lingual manipulation;Lingual  pumping;Incomplete tongue to palate contact;Reduced posterior  propulsion;Delayed oral transit;Other (Comment) (decreased bolus  cohesion) Oral - Thin Oral - Thin Cup:  Weak lingual manipulation;Lingual  pumping;Incomplete tongue to palate contact;Reduced posterior  propulsion;Delayed oral transit;Other (Comment) (decreased bolus  cohesion) Oral - Solids Oral - Puree: Weak lingual manipulation;Lingual  pumping;Incomplete tongue to palate contact;Reduced posterior  propulsion;Delayed oral transit;Other (Comment) (decreased bolus  cohesion) Oral - Mechanical Soft: Weak lingual manipulation;Lingual  pumping;Incomplete tongue to palate contact;Reduced posterior  propulsion;Delayed oral transit;Other (Comment) (decreased bolus  cohesion) Oral - Regular: Not tested   Pharyngeal Phase:  Pharyngeal Phase Pharyngeal Phase: Impaired Pharyngeal - Honey Pharyngeal - Honey Teaspoon: Not tested Pharyngeal - Honey Cup: Delayed swallow initiation;Reduced  epiglottic inversion;Reduced laryngeal elevation;Reduced  airway/laryngeal closure;Reduced tongue base  retraction;Penetration/Aspiration after swallow;Pharyngeal  residue - valleculae;Pharyngeal residue - pyriform sinuses;Other  (Comment) Penetration/Aspiration details (honey cup): Material enters  airway, remains ABOVE vocal cords and not ejected out Pharyngeal - Nectar Pharyngeal - Nectar Teaspoon: Not tested Pharyngeal - Nectar Cup: Delayed swallow initiation;Reduced  epiglottic inversion;Reduced laryngeal elevation;Reduced  airway/laryngeal closure;Reduced tongue base  retraction;Penetration/Aspiration during  swallow;Penetration/Aspiration after swallow;Pharyngeal residue -  valleculae;Pharyngeal residue - pyriform sinuses;Compensatory  strategies attempted (Comment) (chin tuck increases amount of  penetration) Penetration/Aspiration details (nectar cup): Material enters  airway, passes BELOW cords without attempt by patient to eject  out (silent aspiration) Pharyngeal - Thin Pharyngeal - Thin Teaspoon: Not tested Pharyngeal - Thin Cup: Delayed swallow initiation;Reduced  epiglottic inversion;Reduced laryngeal elevation;Reduced   airway/laryngeal closure;Reduced tongue base  retraction;Penetration/Aspiration during  swallow;Penetration/Aspiration after swallow;Pharyngeal residue -  valleculae;Pharyngeal residue - pyriform sinuses Penetration/Aspiration details (thin cup): Material enters  airway, remains ABOVE vocal cords and not ejected out Pharyngeal - Solids Pharyngeal - Puree: Delayed swallow initiation;Reduced epiglottic  inversion;Reduced laryngeal elevation;Reduced airway/laryngeal  closure;Reduced tongue base retraction;Pharyngeal residue -  valleculae;Pharyngeal residue - pyriform sinuses Pharyngeal - Mechanical Soft: Delayed swallow initiation;Reduced  epiglottic inversion;Reduced laryngeal elevation;Reduced  airway/laryngeal closure;Reduced tongue base  retraction;Pharyngeal residue - valleculae;Pharyngeal residue -  pyriform sinuses Pharyngeal - Regular: Not tested   Cervical Esophageal Phase  Cervical Esophageal Phase Cervical Esophageal Phase: Impaired Cervical Esophageal Phase - Comment Cervical Esophageal Comment: decreased clearance due to suspected  osteophytes   GN         Germain Osgood, M.A. CCC-SLP [phone]  Germain Osgood 06/30/2014, 1:14 PM                      ASSESSMENT / PLAN:  PULMONARY OETT None A: COPD history, I am concerned that with large volume may require decompensates.  But no need for intubation as of now. P:   - Titrate O2 for sats. - Spiriva as ordered. - PRN albuterol. - Monitor for signs of respiratory failure closely in the ICU.  CARDIOVASCULAR CVL PIV A: Hemorrhagic shock, responding to IVF so far.  After one liter is at 90/50. P:  - Continue NS. - Caution with volume overload, would d/c IVF immediately once BP is more stable. - Echo noted. - Hold further anti-coagulation for now. - Hold beta blocker and dilt given hypotension.  RENAL A:  No active issues but last BMET is 10/14. P:   - BMET now. - BMET in AM. - Replace electrolytes as  indicated.  GASTROINTESTINAL A:  GI Bleed.  GI aware. P:   - To endoscopy lab now. - Caution with sedation as I foresee respiratory failure if over sedated.  HEMATOLOGIC A:  Hg steadily dropping with GI bleeding and WBC is elevated but suspect a stress response as CXR is not impressive and patient is afebrile.  Has been on cefuroxime but unable to determine why. P:  - T&S - H&H q6 and transfuse if <8 given GI bleeding. - Will transfuse a unit now. - Was not on eliquis but will check coags now. - Correct coags as indicated.  INFECTIOUS A:  Cefuroxime for unknown cause to me at this  point but given hypotension will continue. P:   BCx2 10/16>>> UC 10/16>>> Sputum 10/16>>> Abx: Cefuroxime, start date 10/14>>> F/U on cultures, if higher suspicion for infection then will broaden spectrum.  ENDOCRINE A:  DM and hypotension.   P:   - CBGs. - ISS. - Stress dose steroids. - Check cortisol level.  NEUROLOGIC A:  Sz history but not on medications as of now, AMS due to hypotension, fluctuates with BP. P:   - Hold sz medications. - Maintain BP. - Minimize sedation.  Family updated: Wife updated bedside.  Interdisciplinary Family Meeting:   TODAY'S SUMMARY: 73 year old male with extensive PMH presenting to the ICU from rehab for GI bleeding and hemorrhagic shock.  Will transfuse one unit and keep target at 8 given active GI bleed.  Dr. Karleen Hampshire communicated with Dr. Wallis Mart who will take patient to endoscopy.  Will admit to the ICU for close observation.  Minimize sedation.  Further plan as above.  I have personally obtained a history, examined the patient, evaluated laboratory and imaging results, formulated the assessment and plan and placed orders. CRITICAL CARE: The patient is critically ill with multiple organ systems failure and requires high complexity decision making for assessment and support, frequent evaluation and titration of therapies, application of advanced monitoring  technologies and extensive interpretation of multiple databases. Critical Care Time devoted to patient care services described in this note is 45 minutes.   Rush Farmer, M.D. [facility] Pulmonary/Critical Care Medicine. Pager: [phone]. After hours pager: [phone].  07/08/2014, 4:41 PM

## 2014-07-01 NOTE — Procedures (Signed)
Intubation Procedure Note Chad Morse [id number] 12/14/1940  Procedure: Intubation Indications: Respiratory insufficiency  Procedure Details Consent: Unable to obtain consent because of emergent medical necessity. Time Out: Verified patient identification, verified procedure, site/side was marked, verified correct patient position, special equipment/implants available, medications/allergies/relevent history reviewed, required imaging and test results available.  Performed  Maximum sterile technique was used including gloves and mask.  MAC and 4    Evaluation Hemodynamic Status: Persistent hypotension treated with pressors; O2 sats: transiently fell during during procedure Patient's Current Condition: unstable Complications: No apparent complications Patient did tolerate procedure well. Chest X-ray ordered to verify placement.  CXR: tube position low-repostitioned.   Chad Morse V. 06/16/2014

## 2014-07-01 NOTE — Progress Notes (Signed)
Technical Description:  - CPR performance duration:  18 minutes   - Was defibrillation or cardioversion used?  NO   - Was external pacer placed? No  - Was patient intubated pre/post CPR? Yes    Post CPR evaluation:  - Final Status - Was patient successfully resuscitated ? Yes - What is current rhythm?   Sinus tach - What is current hemodynamic status?  On pressors, unstable  Miscellaneous Information:  - Labs sent, including: CBC, CMET, INR, ABG, lactic   - Primary team notified?    - Family Notified? Yes  - Additional notes/ transfer status:

## 2014-07-01 NOTE — Progress Notes (Signed)
Occupational Therapy Note  Patient Details  Name: Chad Morse MRN: [id number] Date of Birth: 1940-12-15    Pt missed 60 mins skilled OT services.  Pt in bed with nurse attending.  RN reported that pt became faint while having bowel movement.  Pt's BP at 88/40.  RN reported that patient would not be able to participate in therapy.     Leotis Shames [facility] 07/16/2014, 11:11 AM

## 2014-07-01 NOTE — Interval H&P Note (Signed)
History and Physical Interval Note:  06/19/2014 10:04 PM  Chad Morse  has presented today for surgery, with the diagnosis of Gastrointestinal bleeding  The various methods of treatment have been discussed with the patient and family. After consideration of risks, benefits and other options for treatment, the patient's wife has consented to  Procedure(s): ESOPHAGOGASTRODUODENOSCOPY (EGD) (N/A) as a surgical intervention .  The patient's history has been reviewed, patient examined, no change in status, stable for surgery.  I have reviewed the patient's chart and labs.  Questions were answered to the patient's wife's satisfaction.     [name]

## 2014-07-01 NOTE — Progress Notes (Signed)
Advised of positive bleeding scan for ?duod ulcer.  Pt seen on floor prior to transfer to ICU--pale, weak radial pulse, somewhat dyspneic, sBP 93 (improved after IVF from the 70's), alert, coherent.  Reviewed nature, purpose, and risks of egd w/ pt and wife, w/ plan to do egd after stabilization in ICU including blood transfusion.  Pt deteriorated in ICU and is currently coding.  If successful stabilization, will be on standby to do egd at any time this evening.  [name], M.D. [phone]

## 2014-07-01 NOTE — Op Note (Signed)
[facility] [place], [id number]   ENDOSCOPY PROCEDURE REPORT  PATIENT: Chad, Morse  MR#: [id number] BIRTHDATE: 1941/05/02 , 19  yrs. old GENDER: male ENDOSCOPIST:Kaitlyne Friedhoff, MD REFERRED BY: Dr. Luan Pulling ([place]), Dr. Hildred Laser [facility]) PROCEDURE DATE:  17-Jul-2014 PROCEDURE:   EGD, diagnostic ASA CLASS:    Class IV INDICATIONS: gastrointestinal bleeding. MEDICATION: fentanyl drip in ICU TOPICAL ANESTHETIC:   none  DESCRIPTION OF PROCEDURE:   After the risks and benefits of the procedure were explained, informed consent was obtained.  The Pentax Gastroscope [id number]  endoscope was introduced through the mouth  and advanced to the second portion of the duodenum .  The instrument was slowly withdrawn as the mucosa was fully examined.    This procedure was done at the bedside in the intensive care unit. He was intubated, on a ventilator, and on a fentanyl infusion.  No additional medication was used.  With the patient in the supine position, we turned off his NG suction and passed the Pentax standard adult video endoscope along side his endotracheal tube, entering the esophagus without significant difficulty.  The distal esophagus had erosive and erythematous changes.  No Mallory-Weiss tear or esophageal varices were noted.  The stomach was entered.  It contained some dark brown fluid, but no frank blood or coffee-ground material or clots.  The mucosa of the stomach was diffusely edematous and exudative, consistent with acute ischemic gastritis.  The pylorus was entered.  In what I believe was the duodenal bulb (exact location difficult to tell because of edema and deformity), there was a dirty based large ulcer, probably measuring approximately 2 x 5 cm in size.  It did not have any discrete stigma of hemorrhage, specifically, no adherent clot or visible vessel.  As a consequence, no therapeutic interventions  were performed.  The remainder of the duodenal bulb and the second portion of the duodenum were markedly involved with ischemic changes, characterized by dusky mucosa, exudate, and edema.  The scope was removed from the patient.  No biopsies were obtained. He tolerated the procedure well.          The scope was then withdrawn from the patient and the procedure completed.  COMPLICATIONS: There were no immediate complications.  ENDOSCOPIC IMPRESSION: 1. No active bleeding at the time this procedure. No blood present in the upper tract. 2. Duodenal bulbar ulcer, large, without discrete stigmata of hemorrhage 3. Changes consistent with diffuse ischemia involving the stomach and the proximal duodenum  RECOMMENDATIONS: 1. Continue supportive care, trying to maintain the patient's pressure, oxygen saturation, and and oxygen-carrying capacity (hemoglobin). 2. Protonix infusion 3. Sucralfate to help improve mucosal blood flow within the stomach 4. I do not think that the patient's current endoscopic findings support likely benefit of operative intervention, even if the patient's hemodynamics with allow it.  In other words, I do not think that surgery would be helpful in this patient, based on what I am seeing endoscopically. 5. The above findings and impressions have been discussed with the patient's family and the covering critical care physician, Dr. Jimmy Footman.  _______________________________ eSigned:  Ronald Lobo, MD 07-17-2014 11:09 PM     cc:  CPT CODES: ICD CODES:  The ICD and CPT codes recommended by this software are interpretations from the data that the clinical staff has captured with the software.  The verification of the translation of this report to the ICD and CPT codes and modifiers is the sole responsibility of  the health care institution and practicing physician where this report was generated.  [employer] will not be held  responsible for the validity of the ICD and CPT codes included on this report.  AMA assumes no liability for data contained or not contained herein. CPT is a [employer]er, television/film set of the [employer].  PATIENT NAME:  Chad, Morse MR#: [id number]

## 2014-07-01 NOTE — Progress Notes (Signed)
I have discussed options with the family, including the patient's wife and daughter, Butch Penny, and we all agree that endoscopy is appropriate. We do not feel it would be overly invasive or "heroic," but it might offer a chance for diagnosis and possibly effective therapeutic intervention. The fact that he has recently been putting out small to moderate amounts of frank maroon blood from his oral gastric tube makes it seem more likely that we are truly dealing with an upper tract source of bleeding, whereas the initial NG aspirate for the first half hour or so was completely nonbloody, calling into question whether this was truly an upper GI bleed which could be addressed endoscopically.  We discussed the option of emergency surgery, but overall the patient appears to be too tenuous for that.   We also discussed the option of simple observation and medical support, but it was felt that we weren't getting very far with that.   His blood pressure is labile but most recently has been in the 196Q systolic and he has a stable heart rhythm with moderate tachycardia. This is probably about as good a time to do endoscopy as we are going to get. It is really the first opportunity where he has been halfway stable hemodynamically.  It appears unlikely that the endoscopy would be stabilize him any more than he is already unstable; in effect, "we have nothing to lose" by attempting endoscopic management.  [name], M.D. [phone]

## 2014-07-01 NOTE — Progress Notes (Addendum)
Pt up to toilet with large dark black loose watery stool. Very pale; reported feeling of passing out. Completed toileting and returned to bed. BP low and pulse irregular 40-88 checked manual BP and pulse o2 sat @ 84% on RA; restarted oxygen and elevated legs. Rechecked vitals and documented little change except oxygen level did return to 100% with 3l [place]. Episode reported to Dr. Letta Pate. Margarito Liner

## 2014-07-01 NOTE — Procedures (Signed)
Intubation Procedure Note Chad Morse [id number] [birth year]  Procedure: Intubation Indications: Airway protection and maintenance  Procedure Details Consent: Unable to obtain consent because of emergent medical necessity. Time Out: Verified patient identification, verified procedure, site/side was marked, verified correct patient position, special equipment/implants available, medications/allergies/relevent history reviewed, required imaging and test results available.  Performed  Maximum sterile technique was used including cap, gloves, gown, hand hygiene, mask and sheet.  MAC and 4    Evaluation Hemodynamic Status: Transient hypotension treated with pressors, treated with fluid and treated with pressors and fluid; O2 sats: transiently fell during during procedure Patient's Current Condition: stable Complications: No apparent complications Patient did tolerate procedure well. Chest X-ray ordered to verify placement.  CXR: pending  Patient intubated during CODE.   Chad Morse 06/20/2014

## 2014-07-01 NOTE — Procedures (Addendum)
Arterial Catheter Insertion Procedure Note Chad Morse [id number] 04-28-1941  Procedure: Insertion of Arterial Catheter  Indications: Blood pressure monitoring and Frequent blood sampling  Procedure Details Consent: Unable to obtain consent because of emergent medical necessity. Time Out: Verified patient identification, verified procedure, site/side was marked, verified correct patient position, special equipment/implants available, medications/allergies/relevent history reviewed, required imaging and test results available.  Performed  Maximum sterile technique was used including antiseptics, cap, gloves, gown, hand hygiene, mask and sheet. Skin prep: Chlorhexidine; local anesthetic administered 20 gauge catheter was inserted into left femoral artery using the Seldinger technique.  Evaluation Blood flow good; BP tracing good. Complications: No apparent complications.   [name] R. 06/27/2014  I used ultrasound to locate and access the vein/artery.

## 2014-07-01 NOTE — Code Documentation (Signed)
Pt admitted to 2M07 at 1800 with active gi bleed, passing bright red blood per rectum. Pt was initially alert and answering questions appropriately. He started to remove his oxygen and was becoming confused as I was simultaneously having trouble with his blood pressure. I had called [facility] multiple times , and the MD cameraed into the room , gave some orders. I had 1 peripheral IV and NS infusing at 150/hr. We were waiting for lab to draw a type and cross, and had placed 2 calls to them explaining the patient was actively bleeding and this was urgent. The patient started to decompensate, became unresponsive, and  Respiratory changes were noted. We started bagging the patient and called a code. Initially we gave atropine for decelerating bradycardia with short lived response. Dopamine was initiated at 20 mcg/minFor hypotension in addition to NS at 999.  Marland KitchenAt this time Dr. Jeffie Pollock was at the bedside to place a right femoral central line, and Dr.Alva was also at bedside and intubated the patient with an 8 ETT. Dr. Elsworth Soho also placed a right subclavian CVC, and Dr. Lajuan Lines was at bedside and placed a left radial a-line.All lines were difficult to place.Throughout the code, 5 amps of Epinephrine were given,5 amps of bicarb. Additionally fentanyl and versed were given for intubation. The patient continued to have bloody stools and per order emergency release blood was ordered. The patient received 4 units of O neg emergency release blood, and 4 units of emergency release FFP. An OG tube was placed by Dr. Elsworth Soho for return of brown drainage that evolved into bloody drainage. Dr. Cristina Gong, who was at bedside during the code, was again notified of the bloody drainage and his team were planning a procedure after discussing the risks and benefits with the family.Critical vfalue of HGB of 6 was called and reported to the MD's in the room. The family were updated multiple times by Dr. Elsworth Soho, and they modified the patients code  status to limited with no CPR and no Defib.(1950). When patient was stabalized on the Levo, Dopamine and fluids/blood products the family were brought back to the room and are at the patients bedside. They understand the gravity of the situation. The patient survived the code but remains gravely ill, on life support/vasopressors.

## 2014-07-01 NOTE — Progress Notes (Addendum)
[facility]ILITATION     PROGRESS NOTE   73 y.o. right-handed male with history of COPD, atrial fibrillation on Eliquis and remote seizure disorder. Patient on no anti-seizure medication prior to admission. Patient lives with his wife uses a rolling walker prior to admission. Admitted 06/11/2014 after a fall at home when standing from a sitting position and struck his head. There was reports of brief unresponsiveness. He was initially conversant in the ED and had a seizure requiring intubation for airway protection. Noted calcium level 6.1 and magnesium level 0.6 on admission. Cranial CT scan negative for acute abnormalities and followup MRI negative for acute changes. CT angiogram chest negative. Patient initially loaded with Keppra later discontinued secondary to agitation felt this was possibly contributing to his restlessness. No further seizure activity noted and currently on no anti-seizure medication. Suspect hypomagnesemia as well as hypocalcemia possibly related to his seizures. EEG with moderate diffuse swelling no seizure activity noted generalized cerebral dysfunction suspect encephalopathy.   Subjective/Complaints:  Another dark large volume loose stool with out bright red blood per RN, BP dropped into 80 systolic A  review of systems has been performed and if not noted above is otherwise negative.   Objective: Vital Signs: Blood pressure 85/40, pulse 85, temperature 98 F (36.7 C), temperature source Oral, resp. rate 19, weight 89.54 kg (197 lb 6.4 oz), SpO2 100.00%. Dg Chest 2 View  06/30/2014   CLINICAL DATA:  COPD with acute onset of difficulty breathing  EXAM: CHEST  2 VIEW  COMPARISON:  June 17, 2014  FINDINGS: There is underlying emphysematous change. There is no apparent edema or consolidation. The heart size is within normal limits. Pulmonary vascularity is within normal limits. No adenopathy. There is degenerative change in the thoracic spine.  On  the lateral view, the stomach appears distended with fluid and air.  IMPRESSION: Underlying emphysema.  No edema or consolidation.  On lateral view, stomach appears distended with fluid and air. Question a degree of underlying ileus. This finding may warrant abdominal radiographs for further assessment.   Electronically Signed   By: Lowella Grip M.D.   On: 06/30/2014 09:30   Dg Swallowing Func-speech Pathology  06/30/2014   Loletha Grayer, CCC-SLP     06/30/2014  1:16 PM   Objective Swallowing Evaluation: Modified Barium Swallowing Study   Patient Details  Name: NAVDEEP HALT MRN: [id number] Date of Birth: 12-19-1940  Today's Date: 06/30/2014 Time:  0930-1000    Past Medical History:  Past Medical History  Diagnosis Date  . COPD (chronic obstructive pulmonary disease)   . Diabetes mellitus   . Atrial fibrillation   . Peripheral neuropathy   . Gallstones   . Peripheral vascular disease   . IBS (irritable bowel syndrome)   . Peripheral arterial disease   . Tobacco abuse   . Hypertension   . Hyperlipidemia    Past Surgical History:  Past Surgical History  Procedure Laterality Date  . Back surgery     HPI:  73 y/o male with PMH: COPD, Afib, DM, PVD, IBS, HTN admitted  after fall, struck his head with brief unresponsiveness.  Had  seizure in ED and was intubated.  Per MD note pt has had very  poor PO intake and has complained of his vision being blurry.  CT  negative for new abnormality, remote right basal ganglia and left  cerebelluym.  Extubated 9/29 and reintubaed 9/29-10/02. MRI  revealed chronic changes as described. No acute intracranial  findings. CXR cardiomegaly with bilateral pulmonary infiltrates  and small pleural effusions most consistent with congestive heart  failure.  Pt. continued to exhibit pharyngeal dysphagia,  therefore, MBS was performed 06/20/14 with recommendations for Dys  1 textures and pudding thick liquids.     Recommendation/Prognosis  Clinical Impression:   Dysphagia  Diagnosis: Moderate oral phase dysphagia;Moderate  pharyngeal phase dysphagia  Clinical impression: Pt continues to exhibit a moderate  oropharyngeal dysphagia that is sensorimotor and anatomically  based. Suspected osteophytes (MD not present to confirm) impinge  on the pharyngeal space, which interferes with epiglottic  deflection and consequently airway protection, as well as  clearance through the UES. Pharyngeal weakness contributes to  residuals as well, with multiple swallows needed to facilitate  clearance. Pt penetrated nectar thick and thin liquids during the  swallow due to a delay in initiation coupled with decreased  airway seal, and all liquid consistencies are penetrated after  the swallow on residuals. Pt has difficulty clearing penetrates  because of a weakened cough, with nectar thick penetrates  observed to pass below the voal cords. Honey thick liquid  residuals result in trace, more shallow penetration. Recommend to  initiate Dys 2 textures and honey thick liquids via tsp to reduce  the amount of residuals and increase airway protection.   Swallow Evaluation Recommendations:  Diet Recommendations: Honey-thick liquid;Dysphagia 2 (Fine chop) Liquid Administration via: Spoon Medication Administration: Crushed with puree Supervision: Patient able to self feed;Full supervision/cueing  for compensatory strategies Compensations: Slow rate;Small sips/bites;Clear throat after each  swallow;Multiple dry swallows after each bite/sip Postural Changes and/or Swallow Maneuvers: Seated upright 90  degrees;Upright 30-60 min after meal Oral Care Recommendations: Oral care BID Other Recommendations: Order thickener from pharmacy;Prohibited  food (jello, ice cream, thin soups);Remove water pitcher Follow up Recommendations: Home health SLP;24 hour  supervision/assistance    Prognosis:  Prognosis for Safe Diet Advancement: Good Barriers to Reach Goals: Cognitive deficits (structural  component)   Individuals  Consulted: Consulted and Agree with Results and  Recommendations: Patient;RN;PA      SLP Assessment/Plan  Plan:  Potential to Achieve Goals: Good Potential Considerations: Ability to learn/carryover  information;Co-morbidities;Previous level of function   Short Term Goals: Week 2: SLP Short Term Goal 1 (Week 2): Patient  will utilize external visual aids to recall new, daily  information with Mod A multimodal cues.   SLP Short Term Goal 2 (Week 2): Patient will demonstrate  functional problem solving for basic and familiar tasks with Min  A multimodal cues. SLP Short Term Goal 3 (Week 2): Patient will sustain attention to  basic and familiar tasks for 10 minutes with Min A multimodal  cues. SLP Short Term Goal 4 (Week 2): Patient will identify 1 cognitive  and 1 physical deficit with Mod A multimodal cues.  SLP Short Term Goal 5 (Week 2): Patient will utilize swallowing  compensatory strategies with least restrictive diet to minimize  overt s/s of aspiration with Mod A multimodal cues.  SLP Short Term Goal 6 (Week 2): Patient will orient to time,  place, person and situation with Min A multimodal cues.     General: Date of Onset: 06/11/14 Type of Study: Modified Barium Swallowing Study Reason for Referral: Objectively evaluate swallowing function Previous Swallow Assessment: MBS 06/20/14 Diet Prior to this Study: Dysphagia 1 (puree);Pudding-thick  liquids Temperature Spikes Noted: No Respiratory Status: Room air History of Recent Intubation: Yes Length of Intubations (days): 4 days Date extubated: 06/17/14 Behavior/Cognition: Alert;Cooperative;Pleasant  mood;Impulsive;Requires cueing Oral  Cavity - Dentition: Dentures, not available;Edentulous Oral Motor / Sensory Function: Impaired - see Bedside swallow  eval Self-Feeding Abilities: Able to feed self Patient Positioning: Upright in chair Baseline Vocal Quality: Clear Volitional Cough: Weak Volitional Swallow: Unable to elicit Anatomy: Other (Comment) (suspected  osteophytes along cervical  spine impinging on pharyngeal space) Pharyngeal Secretions: Not observed secondary MBS   Reason for Referral:   Objectively evaluate swallowing function    Oral Phase: Oral Preparation/Oral Phase Oral Phase: Impaired Oral - Honey Oral - Honey Cup: Weak lingual manipulation;Lingual  pumping;Incomplete tongue to palate contact;Reduced posterior  propulsion;Delayed oral transit;Other (Comment) (decreased bolus  cohesion) Oral - Nectar Oral - Nectar Cup: Weak lingual manipulation;Lingual  pumping;Incomplete tongue to palate contact;Reduced posterior  propulsion;Delayed oral transit;Other (Comment) (decreased bolus  cohesion) Oral - Thin Oral - Thin Cup: Weak lingual manipulation;Lingual  pumping;Incomplete tongue to palate contact;Reduced posterior  propulsion;Delayed oral transit;Other (Comment) (decreased bolus  cohesion) Oral - Solids Oral - Puree: Weak lingual manipulation;Lingual  pumping;Incomplete tongue to palate contact;Reduced posterior  propulsion;Delayed oral transit;Other (Comment) (decreased bolus  cohesion) Oral - Mechanical Soft: Weak lingual manipulation;Lingual  pumping;Incomplete tongue to palate contact;Reduced posterior  propulsion;Delayed oral transit;Other (Comment) (decreased bolus  cohesion) Oral - Regular: Not tested   Pharyngeal Phase:  Pharyngeal Phase Pharyngeal Phase: Impaired Pharyngeal - Honey Pharyngeal - Honey Teaspoon: Not tested Pharyngeal - Honey Cup: Delayed swallow initiation;Reduced  epiglottic inversion;Reduced laryngeal elevation;Reduced  airway/laryngeal closure;Reduced tongue base  retraction;Penetration/Aspiration after swallow;Pharyngeal  residue - valleculae;Pharyngeal residue - pyriform sinuses;Other  (Comment) Penetration/Aspiration details (honey cup): Material enters  airway, remains ABOVE vocal cords and not ejected out Pharyngeal - Nectar Pharyngeal - Nectar Teaspoon: Not tested Pharyngeal - Nectar Cup: Delayed swallow initiation;Reduced   epiglottic inversion;Reduced laryngeal elevation;Reduced  airway/laryngeal closure;Reduced tongue base  retraction;Penetration/Aspiration during  swallow;Penetration/Aspiration after swallow;Pharyngeal residue -  valleculae;Pharyngeal residue - pyriform sinuses;Compensatory  strategies attempted (Comment) (chin tuck increases amount of  penetration) Penetration/Aspiration details (nectar cup): Material enters  airway, passes BELOW cords without attempt by patient to eject  out (silent aspiration) Pharyngeal - Thin Pharyngeal - Thin Teaspoon: Not tested Pharyngeal - Thin Cup: Delayed swallow initiation;Reduced  epiglottic inversion;Reduced laryngeal elevation;Reduced  airway/laryngeal closure;Reduced tongue base  retraction;Penetration/Aspiration during  swallow;Penetration/Aspiration after swallow;Pharyngeal residue -  valleculae;Pharyngeal residue - pyriform sinuses Penetration/Aspiration details (thin cup): Material enters  airway, remains ABOVE vocal cords and not ejected out Pharyngeal - Solids Pharyngeal - Puree: Delayed swallow initiation;Reduced epiglottic  inversion;Reduced laryngeal elevation;Reduced airway/laryngeal  closure;Reduced tongue base retraction;Pharyngeal residue -  valleculae;Pharyngeal residue - pyriform sinuses Pharyngeal - Mechanical Soft: Delayed swallow initiation;Reduced  epiglottic inversion;Reduced laryngeal elevation;Reduced  airway/laryngeal closure;Reduced tongue base  retraction;Pharyngeal residue - valleculae;Pharyngeal residue -  pyriform sinuses Pharyngeal - Regular: Not tested   Cervical Esophageal Phase  Cervical Esophageal Phase Cervical Esophageal Phase: Impaired Cervical Esophageal Phase - Comment Cervical Esophageal Comment: decreased clearance due to suspected  osteophytes   GN         Germain Osgood, M.A. CCC-SLP [phone]  Germain Osgood 06/30/2014, 1:14 PM                     Recent Labs  06/30/14 2200 06/16/2014 0534  WBC 21.8* 22.2*  HGB 10.0* 9.0*   HCT 29.6* 26.9*  PLT 307 295    Recent Labs  06/29/14 0609  NA 139  K 4.0  CL 94*  GLUCOSE 141*  BUN 15  CREATININE 0.64  CALCIUM 9.1   CBG (last 3)  Recent Labs  06/30/14 1638 06/30/14 2108 06/25/2014 0710  GLUCAP 154* 160* 128*    Wt Readings from Last 3 Encounters:  06/30/14 89.54 kg (197 lb 6.4 oz)  06/21/14 87.952 kg (193 lb 14.4 oz)  05/24/14 92.987 kg (205 lb)    Physical Exam:  Constitutional: He appears well-developed. Pale and lethargic but mentation is appropriate with questions and Hx HENT: oral mucosa moist  Head: Normocephalic.  Eyes: EOM are normal.  Neck: Normal range of motion. Neck supple. No thyromegaly present.  Cardiovascular:  Cardiac rate controlled. No murmur or rubs. Reg rhythm.  Respiratory: Effort normal and breath sounds normal. No respiratory distress.Diffuse upper airway sounds GI: Soft. Bowel sounds are normal. He exhibits no distension.  Neurological: He is alert and generally appropriate. Fair insight and awareness. .   motor strength is 4/5 bilateral deltoid, bicep, tricep, grip  3/5 bilaterally hip flexors, 4- knee extensors, 4 ankle dorsiflexor plantar flexor  Sensation reduced to light touch bilateral lower extremities below the knees. DTR's 1+  Musculoskeletal: trace LE edema bilaterally unchanged Skin: numerous small abrasions on legs. Small area of breakdown on the left lateral malleolus  Psych:  pleasant and cooperative. No agitation     Assessment/Plan: 1. Functional deficits secondary to debilitation related to seizures and multiple medical issues, associated encephalopathy vs mild TBI  which require 3+ hours per day of interdisciplinary therapy in a comprehensive inpatient rehab setting. Physiatrist is providing close team supervision and 24 hour management of active medical problems listed below. Physiatrist and rehab team continue to assess barriers to discharge/monitor patient progress toward functional and medical  goals. GI bleed unknown source , stop Eliquis will also ask IM to eval for poss acute transfer since he does not tolerate PT OT at this time FIM: FIM - Bathing Bathing Steps Patient Completed: Chest;Right Arm;Left Arm;Abdomen;Front perineal area;Right upper leg;Left upper leg;Buttocks Bathing: 4: Min-Patient completes 8-9 59f 10 parts or 75+ percent  FIM - Upper Body Dressing/Undressing Upper body dressing/undressing steps patient completed: Thread/unthread right sleeve of pullover shirt/dresss;Thread/unthread left sleeve of pullover shirt/dress;Put head through opening of pull over shirt/dress;Pull shirt over trunk Upper body dressing/undressing: 5: Supervision: Safety issues/verbal cues FIM - Lower Body Dressing/Undressing Lower body dressing/undressing steps patient completed:  (declined to fully remove pants to bathe) Lower body dressing/undressing: 0: Activity did not occur  FIM - Toileting Toileting steps completed by patient: Adjust clothing after toileting;Adjust clothing prior to toileting Toileting Assistive Devices: Grab bar or rail for support Toileting: 3: Mod-Patient completed 2 of 3 steps  FIM - Radio producer Devices: Walker;Grab bars;Elevated toilet seat Toilet Transfers: 4-To toilet/BSC: Min A (steadying Pt. > 75%);4-From toilet/BSC: Min A (steadying Pt. > 75%)  FIM - Control and instrumentation engineer Devices: Walker;Arm rests Bed/Chair Transfer: 0: Activity did not occur  FIM - Locomotion: Wheelchair Distance: 100 Locomotion: Wheelchair: 1: Total Assistance/staff pushes wheelchair (Pt<25%) FIM - Locomotion: Ambulation Locomotion: Ambulation Assistive Devices: Administrator Ambulation/Gait Assistance: 4: Min assist Locomotion: Ambulation: 0: Activity did not occur  Comprehension Comprehension Mode: Auditory Comprehension: 4-Understands basic 75 - 89% of the time/requires cueing 10 - 24% of the  time  Expression Expression Mode: Verbal Expression: 5-Expresses basic 90% of the time/requires cueing < 10% of the time.  Social Interaction Social Interaction: 5-Interacts appropriately 90% of the time - Needs monitoring or encouragement for participation or interaction.  Problem Solving Problem Solving: 3-Solves basic 50 - 74% of the time/requires cueing 25 - 49% of  the time  Memory Memory: 3-Recognizes or recalls 50 - 74% of the time/requires cueing 25 - 49% of the time  Medical Problem List and Plan:  1. Functional deficits secondary to debilitation after seizures, multiple medical issues and associated encephalopathy 2. DVT Prophylaxis/Anticoagulation: Eliquis.  3. Pain Management: Tylenol as needed  4. Dysphagia. Dysphagia 1 pudding thick liquids. Continue IVF at HS 5. Neuropsych: This patient is not capable of making decisions on his own behalf.  6. Skin/Wound Care: Routine skin checks  7. Fluids/Electrolytes/Nutrition: recheck BMET 8. Atrial fibrillation. Continue Cardizem 60 mg 4 times a day, Lopressor 12.5 mg twice a day. Cardiac rate generally controlled  And in fact running on low side reduce cardizem to 30mg     9. Diabetes mellitus.  . Sliding scale insulin. Patient on Glucophage 500 mg twice a day prior to admission. Began metformin 250mg  bid which seems to have helped---but may be causing diarrhea  -amaryl 10. Tobacco abuse /COPD. DuoNeb every 6 hours, Albuterol nebulizer as needed. Check oxygen saturations every shift. Patient on Spiriva 18 mcg daily prior to admission. Provide counseling for tobacco abuse  11. Hyperlipidemia. Lipitor  12. Loose stool: hx IBS, also with lower GI bleed, GI on consult, add stool C diff       13. Hypokalemia: supplemented. Improved today 14. Leukocytosis:still elevated.  -afebrile currently  15.  Emphysema, sounds like bronchitis curently, CXR without evidence of PNA, start mucolytic , suppresant and inhaler  LOS (Days) 10 A FACE  TO FACE EVALUATION WAS PERFORMED  Alysia Penna E 06/18/2014 10:24 AM

## 2014-07-01 NOTE — Progress Notes (Signed)
Dr. Earney Mallet notified of return from procedure. Bed obtained on 3W23; attempted to call report to [facility] RN, felt pt was too unstable and needed ICU; Charge nurse in agreement and Dr. Earney Mallet was notified of response. Also received call from blood bank regarding need for type and cross match order for  Two units of blood to be transfused. Dr. Earney Mallet notified of need for additional order for type and cross match. See doc flowsheet for vital signs; Dr. Earney Mallet noted give an additional 1000 cc ns bolus now and obtain vital signs q 30 minutes. Margarito Liner

## 2014-07-01 NOTE — Progress Notes (Signed)
Patient transported from [facility].  Patient had multiple bloody and loose bowel movements, c-diff precautions initiated to rule out c-diff.  Family and patient educated about c-diff precautions and the use of Protective personal equipment and strict handwashing.  Amberli Ruegg, Lauralyn Primes, RN

## 2014-07-01 NOTE — Procedures (Signed)
Central Venous Catheter Insertion Procedure Note SIR MALLIS [id number] 09-Jun-1941  Procedure: Insertion of Central Venous Catheter Indications: Drug and/or fluid administration  Procedure Details Consent: Unable to obtain consent because of emergent medical necessity. Time Out: Verified patient identification, verified procedure, site/side was marked, verified correct patient position, special equipment/implants available, medications/allergies/relevent history reviewed, required imaging and test results available.  Performed  Maximum sterile technique was used including antiseptics, gloves, hand hygiene and mask. Skin prep: Chlorhexidine; local anesthetic administered A antimicrobial bonded/coated triple lumen catheter was placed in the right subclavian vein using the Seldinger technique.  Evaluation Blood flow good Complications: No apparent complications Patient did tolerate procedure well. Chest X-ray ordered to verify placement.  CXR: normal.  This line was not placed under sterile conditions due to code situation & should be replaced in 24-48h  ALVA,RAKESH V. 07/04/2014, 7:38 PM

## 2014-07-01 NOTE — Progress Notes (Signed)
Report called to [name]. Denise RRT to unit to assist with transfer. Pt and wife and dtr notified of plan and move to 22m07. Transported via bed with o2 @ 3l/Parchment. IVF NS @ 150 cch/hr started after 1000cxc NS bolus transfused. Chad Morse

## 2014-07-01 NOTE — Progress Notes (Signed)
eLink Physician-Brief Progress Note Patient Name: Chad Morse DOB: 06-09-1941 MRN: [id number]   Date of Service  06/17/2014  HPI/Events of Note  GI bleed (UGI most likely), now with inc bloody outpt via OGT (100cc), and dropping sbp  eICU Interventions  Gave 2 amps of bicarb, 1L NS, ordered 2u PRBC. GI MD talking to family about upper endoscopy.      Intervention Category Major Interventions: Other:  [name] 07/07/2014, 9:00 PM

## 2014-07-01 NOTE — Consult Note (Addendum)
[facility] Medical Consultation  Chad Morse BMW:[id number] DOB: [birth year] DOA: 06/21/2014 PCP: Alonza Bogus, MD   Requesting physician: Dr Cyril Mourning Date of consultation: 06/24/2014 Reason for consultation: gi bleeding.  Impression/Recommendations Active Problems:   COPD (chronic obstructive pulmonary disease)   Gastrointestinal bleeding, lower   Acute blood loss anemia   Chronic anticoagulation   Debilitated    1. Lower GI bleed. : - transfer ICU,nm scan positive for bleeding duodenal ulcer. Cbc every  6 hours , transfuse to keep hgb>8. Gi consulted and recommendations given, . 2 units of prbc transfusion ordered. Made him NPO, IV ppi. And stopped anticoagulation.  2. Hypotension: Responding  to fluid bolus. Resume IV fluids and hold bb blocker and cardizem. Further management as per PCCM. 3. Seizures: resume home medications 4. Acute respiratory failure with hypoxia: pt on 3 lit : CXR ordered no wheezing heard on exam.  5. Leukocytosis: unclear etiology. c diff pcr, UA, CXR ordered and pending.  6. Atrial fibrillation: rate controlled. Hold all anticoagulation.  Chief Complaint: lower gi bleed.   HPI:  73 year old male with h/o COPD, afib, admitted to the hospital on 27 th of September by PCCM, after an episode of seizures, intubated for airway protection. He was later on discharged to rehabilitation. As per the patient , he started having loose BM since yesterday, followed by bloody bowel movement since this morning. GI consulted and recommended observation. TRH consulted for management of medical issues. On further questioning he had repeated bloody bowel movements since 10 am today and NM scan ordered, showed bleeding duodenal ulcer. He became hypotensive despite fluid boluses and maintenance fluids. PCCM consulted and plan for transfer to ICU. He also reported some sob this morning and he was put on oxygen.   Review of Systems:  Bleeding per rectum and sob since  this am.   Past Medical History  Diagnosis Date  . COPD (chronic obstructive pulmonary disease)   . Diabetes mellitus   . Atrial fibrillation   . Peripheral neuropathy   . Gallstones   . Peripheral vascular disease   . IBS (irritable bowel syndrome)   . Peripheral arterial disease   . Tobacco abuse   . Hypertension   . Hyperlipidemia    Past Surgical History  Procedure Laterality Date  . Back surgery     Social History:  reports that he has been smoking Cigarettes.  He has been smoking about 0.00 packs per day for the past 60 years. He has never used smokeless tobacco. He reports that he does not drink alcohol or use illicit drugs.  Allergies  Allergen Reactions  . Ciprofloxacin Hives  . Penicillins Hives  . Sulfonamide Derivatives Hives   History reviewed. No pertinent family history.  Prior to Admission medications   Medication Sig Start Date End Date Taking? Authorizing Provider  allopurinol (ZYLOPRIM) 100 MG tablet Take 100 mg by mouth daily.    Historical Provider, MD  ALPRAZolam Duanne Moron) 1 MG tablet Take 1 mg by mouth daily as needed for anxiety or sleep.  12/25/12   Historical Provider, MD  apixaban (ELIQUIS) 5 MG TABS tablet Take 1 tablet (5 mg total) by mouth 2 (two) times daily. 03/28/14   Lorretta Harp, MD  baclofen (LIORESAL) 20 MG tablet Take 10-20 mg by mouth 2 (two) times daily. Take 10 mg morning, take 20 mg noon, 10 mg at 6 pm and 20 mg 11 pm.    Historical Provider, MD  cefUROXime (CEFTIN) 250 MG  tablet Take 1 tablet (250 mg total) by mouth 2 (two) times daily with a meal. 05/24/14   Dorie Rank, MD  cilostazol (PLETAL) 50 MG tablet Take 50 mg by mouth 2 (two) times daily.  01/04/13   Historical Provider, MD  diltiazem (CARDIZEM CD) 120 MG 24 hr capsule Take 1 capsule (120 mg total) by mouth daily. 12/07/13   Lorretta Harp, MD  folic acid (FOLVITE) 1 MG tablet Take 1 mg by mouth daily.    Historical Provider, MD  Folic Acid-Vit W2-NFA O13 (FOLBEE) 2.5-25-1 MG  TABS tablet Take 1 tablet by mouth daily. 11/16/13   Lorretta Harp, MD  furosemide (LASIX) 40 MG tablet Take 40 mg by mouth daily.    Historical Provider, MD  HYDROcodone-acetaminophen (NORCO) 10-325 MG per tablet Take 1 tablet by mouth 3 (three) times daily. For pain    Historical Provider, MD  KLOR-CON M20 20 MEQ tablet Take 20 mEq by mouth daily.  12/10/12   Historical Provider, MD  metFORMIN (GLUCOPHAGE) 500 MG tablet Take 500 mg by mouth 2 (two) times daily.  04/25/14   Historical Provider, MD  metoprolol (LOPRESSOR) 50 MG tablet Take 25 mg by mouth every evening.     Historical Provider, MD  mupirocin cream (BACTROBAN) 2 % Apply 1 application topically as needed (for skin irritation).  09/01/13   Historical Provider, MD  ondansetron (ZOFRAN) 4 MG tablet Take 1 tablet (4 mg total) by mouth every 6 (six) hours. 05/24/14   Dorie Rank, MD  pantoprazole (PROTONIX) 40 MG tablet Take 1 tablet (40 mg total) by mouth daily. 10/13/13   Lorretta Harp, MD  ramipril (ALTACE) 2.5 MG capsule Take 1 capsule (2.5 mg total) by mouth daily. 10/18/13   Lorretta Harp, MD  simvastatin (ZOCOR) 20 MG tablet Take 1 tablet (20 mg total) by mouth every evening. 11/24/13   Lorretta Harp, MD  SPIRIVA HANDIHALER 18 MCG inhalation capsule Place 18 mcg into inhaler and inhale daily as needed (for shortness of breath).     Historical Provider, MD  tiZANidine (ZANAFLEX) 4 MG tablet Take 2-4 mg by mouth 4 (four) times daily. 6 am- half tab 11 am- 1 tab 6 pm- half tab 11 pm- 2 tabs 07/06/13   Antony Contras, MD  traZODone (DESYREL) 50 MG tablet Take 100 mg by mouth at bedtime.  06/29/13   Historical Provider, MD  zolpidem (AMBIEN CR) 12.5 MG CR tablet Take 12.5 mg by mouth daily. 06/29/13   Historical Provider, MD   Physical Exam: Blood pressure 77/30, pulse 89, temperature 97.6 F (36.4 C), temperature source Oral, resp. rate 19, weight 89.54 kg (197 lb 6.4 oz), SpO2 100.00%. Filed Vitals:   06/27/2014 1611  BP: 77/30   Pulse: 89  Temp: 97.6 F (36.4 C)  Resp:      General:  Alert afebrile on 3 [place] OXYGEN.   Eyes: PERLA.  Cardiovascular: s1s2,   Respiratory: decreased air entry at bases, no wheezing or rhonchi  Abdomen: soft non tender non distended bowel sounds heard  Skin: no rash  Musculoskeletal: pedal edema  Neurologic: no focal deficits  Labs on Admission:  Basic Metabolic Panel:  Recent Labs Lab 06/25/14 0500 06/27/14 0450 06/29/14 0609  NA 136* 137 139  K 3.4* 4.1 4.0  CL 95* 98 94*  CO2 33* 30 33*  GLUCOSE 123* 133* 141*  BUN 10 14 15   CREATININE 0.55 0.60 0.64  CALCIUM 7.7* 8.3* 9.1  MG  --   --  1.6   Liver Function Tests:  Recent Labs Lab 06/29/14 0609  AST 51*  ALT 95*  ALKPHOS 105  BILITOT 0.5  PROT 6.0  ALBUMIN 2.8*   No results found for this basename: LIPASE, AMYLASE,  in the last 168 hours No results found for this basename: AMMONIA,  in the last 168 hours CBC:  Recent Labs Lab 06/30/14 0445 06/30/14 1400 06/30/14 1904 06/30/14 2200 07/10/2014 0534 06/16/2014 0945  WBC 22.5* 24.4* 24.7* 21.8* 22.2* 22.8*  NEUTROABS 17.9*  --   --   --   --   --   HGB 12.7* 11.6* 11.4* 10.0* 9.0* 8.1*  HCT 38.0* 34.9* 34.0* 29.6* 26.9* 24.9*  MCV 89.6 92.1 89.5 89.2 91.8 92.9  PLT 337 335 349 307 295 309   Cardiac Enzymes: No results found for this basename: CKTOTAL, CKMB, CKMBINDEX, TROPONINI,  in the last 168 hours BNP: No components found with this basename: POCBNP,  CBG:  Recent Labs Lab 06/30/14 1121 06/30/14 1638 06/30/14 2108 06/23/2014 0710 06/29/2014 1113  GLUCAP 148* 154* 160* 128* 142*    Radiological Exams on Admission: Dg Chest 2 View  06/30/2014   CLINICAL DATA:  COPD with acute onset of difficulty breathing  EXAM: CHEST  2 VIEW  COMPARISON:  June 17, 2014  FINDINGS: There is underlying emphysematous change. There is no apparent edema or consolidation. The heart size is within normal limits. Pulmonary vascularity is within normal  limits. No adenopathy. There is degenerative change in the thoracic spine.  On the lateral view, the stomach appears distended with fluid and air.  IMPRESSION: Underlying emphysema.  No edema or consolidation.  On lateral view, stomach appears distended with fluid and air. Question a degree of underlying ileus. This finding may warrant abdominal radiographs for further assessment.   Electronically Signed   By: Lowella Grip M.D.   On: 06/30/2014 09:30   Nm Gi Blood Loss  06/22/2014   CLINICAL DATA:  Probable lower GI bleed. Previously on anti coagulation.  EXAM: NUCLEAR MEDICINE GASTROINTESTINAL BLEEDING SCAN  TECHNIQUE: Sequential abdominal images were obtained following intravenous administration of Tc-51m labeled red blood cells.  RADIOPHARMACEUTICALS:  25.0 mCi Tc-35m in-vitro labeled red cells.  COMPARISON:  CT 06/10/2014  FINDINGS: Examination demonstrates increased radiotracer activity beginning in the right upper quadrant at approximately 10-15 min and propagating distally through the small bowel throughout the remainder of the study. This suggest an upper GI bleed likely originating over the region of the distal stomach or duodenal C sweep possibly representing bleeding ulcer in this region.  IMPRESSION: Positive for upper GI bleed originating in the right upper quadrant likely in the region of the distal stomach or duodenal C sweep. This may represent a bleeding ulcer.   Electronically Signed   By: Marin Olp M.D.   On: 07/05/2014 15:59   Dg Swallowing Func-speech Pathology  06/30/2014   Loletha Grayer, CCC-SLP     06/30/2014  1:16 PM   Objective Swallowing Evaluation: Modified Barium Swallowing Study   Patient Details  Name: Chad Morse MRN: [id number] Date of Birth: 12-Sep-1941  Today's Date: 06/30/2014 Time:  0930-1000    Past Medical History:  Past Medical History  Diagnosis Date  . COPD (chronic obstructive pulmonary disease)   . Diabetes mellitus   . Atrial fibrillation   .  Peripheral neuropathy   . Gallstones   . Peripheral vascular disease   . IBS (irritable bowel syndrome)   . Peripheral arterial disease   .  Tobacco abuse   . Hypertension   . Hyperlipidemia    Past Surgical History:  Past Surgical History  Procedure Laterality Date  . Back surgery     HPI:  73 y/o male with PMH: COPD, Afib, DM, PVD, IBS, HTN admitted  after fall, struck his head with brief unresponsiveness.  Had  seizure in ED and was intubated.  Per MD note pt has had very  poor PO intake and has complained of his vision being blurry.  CT  negative for new abnormality, remote right basal ganglia and left  cerebelluym.  Extubated 9/29 and reintubaed 9/29-10/02. MRI  revealed chronic changes as described. No acute intracranial  findings. CXR cardiomegaly with bilateral pulmonary infiltrates  and small pleural effusions most consistent with congestive heart  failure.  Pt. continued to exhibit pharyngeal dysphagia,  therefore, MBS was performed 06/20/14 with recommendations for Dys  1 textures and pudding thick liquids.     Recommendation/Prognosis  Clinical Impression:   Dysphagia Diagnosis: Moderate oral phase dysphagia;Moderate  pharyngeal phase dysphagia  Clinical impression: Pt continues to exhibit a moderate  oropharyngeal dysphagia that is sensorimotor and anatomically  based. Suspected osteophytes (MD not present to confirm) impinge  on the pharyngeal space, which interferes with epiglottic  deflection and consequently airway protection, as well as  clearance through the UES. Pharyngeal weakness contributes to  residuals as well, with multiple swallows needed to facilitate  clearance. Pt penetrated nectar thick and thin liquids during the  swallow due to a delay in initiation coupled with decreased  airway seal, and all liquid consistencies are penetrated after  the swallow on residuals. Pt has difficulty clearing penetrates  because of a weakened cough, with nectar thick penetrates  observed to pass below the  voal cords. Honey thick liquid  residuals result in trace, more shallow penetration. Recommend to  initiate Dys 2 textures and honey thick liquids via tsp to reduce  the amount of residuals and increase airway protection.   Swallow Evaluation Recommendations:  Diet Recommendations: Honey-thick liquid;Dysphagia 2 (Fine chop) Liquid Administration via: Spoon Medication Administration: Crushed with puree Supervision: Patient able to self feed;Full supervision/cueing  for compensatory strategies Compensations: Slow rate;Small sips/bites;Clear throat after each  swallow;Multiple dry swallows after each bite/sip Postural Changes and/or Swallow Maneuvers: Seated upright 90  degrees;Upright 30-60 min after meal Oral Care Recommendations: Oral care BID Other Recommendations: Order thickener from pharmacy;Prohibited  food (jello, ice cream, thin soups);Remove water pitcher Follow up Recommendations: Home health SLP;24 hour  supervision/assistance    Prognosis:  Prognosis for Safe Diet Advancement: Good Barriers to Reach Goals: Cognitive deficits (structural  component)   Individuals Consulted: Consulted and Agree with Results and  Recommendations: Patient;RN;PA      SLP Assessment/Plan  Plan:  Potential to Achieve Goals: Good Potential Considerations: Ability to learn/carryover  information;Co-morbidities;Previous level of function   Short Term Goals: Week 2: SLP Short Term Goal 1 (Week 2): Patient  will utilize external visual aids to recall new, daily  information with Mod A multimodal cues.   SLP Short Term Goal 2 (Week 2): Patient will demonstrate  functional problem solving for basic and familiar tasks with Min  A multimodal cues. SLP Short Term Goal 3 (Week 2): Patient will sustain attention to  basic and familiar tasks for 10 minutes with Min A multimodal  cues. SLP Short Term Goal 4 (Week 2): Patient will identify 1 cognitive  and 1 physical deficit with Mod A multimodal cues.  SLP Short Term Goal  5 (Week 2): Patient  will utilize swallowing  compensatory strategies with least restrictive diet to minimize  overt s/s of aspiration with Mod A multimodal cues.  SLP Short Term Goal 6 (Week 2): Patient will orient to time,  place, person and situation with Min A multimodal cues.     General: Date of Onset: 06/11/14 Type of Study: Modified Barium Swallowing Study Reason for Referral: Objectively evaluate swallowing function Previous Swallow Assessment: MBS 06/20/14 Diet Prior to this Study: Dysphagia 1 (puree);Pudding-thick  liquids Temperature Spikes Noted: No Respiratory Status: Room air History of Recent Intubation: Yes Length of Intubations (days): 4 days Date extubated: 06/17/14 Behavior/Cognition: Alert;Cooperative;Pleasant  mood;Impulsive;Requires cueing Oral Cavity - Dentition: Dentures, not available;Edentulous Oral Motor / Sensory Function: Impaired - see Bedside swallow  eval Self-Feeding Abilities: Able to feed self Patient Positioning: Upright in chair Baseline Vocal Quality: Clear Volitional Cough: Weak Volitional Swallow: Unable to elicit Anatomy: Other (Comment) (suspected osteophytes along cervical  spine impinging on pharyngeal space) Pharyngeal Secretions: Not observed secondary MBS   Reason for Referral:   Objectively evaluate swallowing function    Oral Phase: Oral Preparation/Oral Phase Oral Phase: Impaired Oral - Honey Oral - Honey Cup: Weak lingual manipulation;Lingual  pumping;Incomplete tongue to palate contact;Reduced posterior  propulsion;Delayed oral transit;Other (Comment) (decreased bolus  cohesion) Oral - Nectar Oral - Nectar Cup: Weak lingual manipulation;Lingual  pumping;Incomplete tongue to palate contact;Reduced posterior  propulsion;Delayed oral transit;Other (Comment) (decreased bolus  cohesion) Oral - Thin Oral - Thin Cup: Weak lingual manipulation;Lingual  pumping;Incomplete tongue to palate contact;Reduced posterior  propulsion;Delayed oral transit;Other (Comment) (decreased bolus  cohesion)  Oral - Solids Oral - Puree: Weak lingual manipulation;Lingual  pumping;Incomplete tongue to palate contact;Reduced posterior  propulsion;Delayed oral transit;Other (Comment) (decreased bolus  cohesion) Oral - Mechanical Soft: Weak lingual manipulation;Lingual  pumping;Incomplete tongue to palate contact;Reduced posterior  propulsion;Delayed oral transit;Other (Comment) (decreased bolus  cohesion) Oral - Regular: Not tested   Pharyngeal Phase:  Pharyngeal Phase Pharyngeal Phase: Impaired Pharyngeal - Honey Pharyngeal - Honey Teaspoon: Not tested Pharyngeal - Honey Cup: Delayed swallow initiation;Reduced  epiglottic inversion;Reduced laryngeal elevation;Reduced  airway/laryngeal closure;Reduced tongue base  retraction;Penetration/Aspiration after swallow;Pharyngeal  residue - valleculae;Pharyngeal residue - pyriform sinuses;Other  (Comment) Penetration/Aspiration details (honey cup): Material enters  airway, remains ABOVE vocal cords and not ejected out Pharyngeal - Nectar Pharyngeal - Nectar Teaspoon: Not tested Pharyngeal - Nectar Cup: Delayed swallow initiation;Reduced  epiglottic inversion;Reduced laryngeal elevation;Reduced  airway/laryngeal closure;Reduced tongue base  retraction;Penetration/Aspiration during  swallow;Penetration/Aspiration after swallow;Pharyngeal residue -  valleculae;Pharyngeal residue - pyriform sinuses;Compensatory  strategies attempted (Comment) (chin tuck increases amount of  penetration) Penetration/Aspiration details (nectar cup): Material enters  airway, passes BELOW cords without attempt by patient to eject  out (silent aspiration) Pharyngeal - Thin Pharyngeal - Thin Teaspoon: Not tested Pharyngeal - Thin Cup: Delayed swallow initiation;Reduced  epiglottic inversion;Reduced laryngeal elevation;Reduced  airway/laryngeal closure;Reduced tongue base  retraction;Penetration/Aspiration during  swallow;Penetration/Aspiration after swallow;Pharyngeal residue -  valleculae;Pharyngeal residue  - pyriform sinuses Penetration/Aspiration details (thin cup): Material enters  airway, remains ABOVE vocal cords and not ejected out Pharyngeal - Solids Pharyngeal - Puree: Delayed swallow initiation;Reduced epiglottic  inversion;Reduced laryngeal elevation;Reduced airway/laryngeal  closure;Reduced tongue base retraction;Pharyngeal residue -  valleculae;Pharyngeal residue - pyriform sinuses Pharyngeal - Mechanical Soft: Delayed swallow initiation;Reduced  epiglottic inversion;Reduced laryngeal elevation;Reduced  airway/laryngeal closure;Reduced tongue base  retraction;Pharyngeal residue - valleculae;Pharyngeal residue -  pyriform sinuses Pharyngeal - Regular: Not tested   Cervical Esophageal Phase  Cervical Esophageal Phase Cervical Esophageal Phase: Impaired  Cervical Esophageal Phase - Comment Cervical Esophageal Comment: decreased clearance due to suspected  osteophytes   GN         Germain Osgood, M.A. CCC-SLP [phone]  Germain Osgood 06/30/2014, 1:14 PM                     EKG:   Time spent: 85 minutes  [facility] Pager [phone]  If 7PM-7AM, please contact night-coverage [email or web address] Password [email or web address] 07/12/2014, 4:29 PM

## 2014-07-01 NOTE — Progress Notes (Signed)
I was called emergently to see this 73 year old in the setting of cardiac arrest. He was admitted to the ICU from rehabilitation for hemorrhagic shock, while on eliquis, last hemoglobin was 8 this am. Nuclear imaging was suggestive bleeding in the right upper quadrant, probably duodenum. On my arrival, patient was being bagged by the ICU team, he had peripheral IVs. I emergently intubated the patient, he was unresponsive and no medications were required.  Subsequently, he lost his pulse, and required CPR and ACLS protocol for 18 minutes , epinephrine x5 was administered-please see separate note for details. We were able to resuscitate him, dopamine and Levophed was started and traced to max doses rapidly. I placed a right subclavian central line emergently, this was subsequently confirmed in position by obtaining a CVP tracing and chest x-ray. Ventilator settings were adjusted based on blood gas. OG tube was inserted- non-bloody brown colored aspirate was obtained. Repeat hemoglobin was noted to be 6-he was emergently transfused 2 units of O- blood while awaiting this lab. INR was noted to be 2.9-4 units of FFP will be transfused. I spoke to the family about this difficult situation- they requested reasonable interventions that would give him quality of life. After the second such discussion, we decided to place a no CPR/no electric shocks order-and continue current forms of life support. We would resuscitate him with blood products as needed.  Total critical care time, independent of procedures was 60 minutes I handed over care to Dr. Lowry Ram at 8 PM  Kara Mead MD. [facility] Pager [phone] If no response call [phone]

## 2014-07-01 NOTE — Progress Notes (Signed)
Endoscopy was well tolerated. Please see dictated report for findings and recommendations.  [name], M.D. [phone]

## 2014-07-01 NOTE — Progress Notes (Signed)
ANTIBIOTIC CONSULT NOTE - INITIAL  Pharmacy Consult for Doripenem  Indication: Concern for bacterial translocation/intra-abdominal infection  Allergies  Allergen Reactions  . Ciprofloxacin Hives  . Penicillins Hives  . Sulfonamide Derivatives Hives   Patient Measurements: Height: 6' (182.9 cm) Weight: 196 lb 10.4 oz (89.2 kg) IBW/kg (Calculated) : 77.6  Vital Signs: Temp: 94.1 F (34.5 C) (10/16 2226) Temp Source: Oral (10/16 2209) BP: 154/90 mmHg (10/16 2315) Pulse Rate: 120 (10/16 2330)  Labs:  Recent Labs  06/29/14 0609  07/13/2014 0534 07/05/2014 0945 07/16/2014 1839  WBC  --   < > 22.2* 22.8* 28.4*  HGB  --   < > 9.0* 8.1* 6.0*  PLT  --   < > 295 309 287  CREATININE 0.64  --   --   --  1.25  < > = values in this interval not displayed. Estimated Creatinine Clearance: 56 ml/min (by C-G formula based on Cr of 1.25).   Medical History: Past Medical History  Diagnosis Date  . COPD (chronic obstructive pulmonary disease)   . Diabetes mellitus   . Atrial fibrillation   . Peripheral neuropathy   . Gallstones   . Peripheral vascular disease   . IBS (irritable bowel syndrome)   . Peripheral arterial disease   . Tobacco abuse   . Hypertension   . Hyperlipidemia     Assessment: 73 y/o M to start coverage for possible intra-abdominal infection. WBC markedly elevated at 28.4, renal function appropriate for age, using Doripenem due to national shortage on Primaxin.   Goal of Therapy:  Resolution of infection   Plan:  -Doripenem 500 mg IV q8h -Trend WBC, temp, renal function   Narda Bonds 06/24/2014,11:35 PM

## 2014-07-01 NOTE — Progress Notes (Signed)
Physical Therapy Session Note  Patient Details  Name: Chad Morse MRN: [id number] Date of Birth: [birth year]  Today's Date: 07/07/2014 PT Missed Time: 60 Minutes Missed Time Reason: Patient ill (Comment)  Short Term Goals: Week 2:  PT Short Term Goal 1 (Week 2): STGs=LTGs due to anticipated LOS  Skilled Therapeutic Interventions/Progress Updates:  Pt missed 60 minutes of scheduled skilled physical therapy due to feeling ill and need for ongoing medical management regarding GI bleed.   Therapy Documentation Precautions:  Precautions Precautions: Fall Precaution Comments: monitor O2 and HR Restrictions Weight Bearing Restrictions: No General: PT Amount of Missed Time (min): 60 Minutes PT Missed Treatment Reason: Patient ill (Comment)  See FIM for current functional status  Therapy/Group: Individual Therapy  Gilmore Laroche 06/23/2014, 4:13 PM

## 2014-07-02 ENCOUNTER — Inpatient Hospital Stay (HOSPITAL_COMMUNITY): Payer: Medicare Other

## 2014-07-02 LAB — PREPARE FRESH FROZEN PLASMA
Unit division: 0
Unit division: 0

## 2014-07-02 LAB — CBC
HCT: 32.2 % — ABNORMAL LOW (ref 39.0–52.0)
Hemoglobin: 10.8 g/dL — ABNORMAL LOW (ref 13.0–17.0)
MCH: 29.8 pg (ref 26.0–34.0)
MCHC: 33.5 g/dL (ref 30.0–36.0)
MCV: 88.7 fL (ref 78.0–100.0)
PLATELETS: 91 10*3/uL — AB (ref 150–400)
RBC: 3.63 MIL/uL — ABNORMAL LOW (ref 4.22–5.81)
RDW: 14.9 % (ref 11.5–15.5)
WBC: 34.5 10*3/uL — AB (ref 4.0–10.5)

## 2014-07-02 LAB — POCT I-STAT 3, ART BLOOD GAS (G3+)
ACID-BASE DEFICIT: 22 mmol/L — AB (ref 0.0–2.0)
Bicarbonate: 7.5 mEq/L — ABNORMAL LOW (ref 20.0–24.0)
O2 Saturation: 99 %
PH ART: 7.051 — AB (ref 7.350–7.450)
TCO2: 8 mmol/L (ref 0–100)
pCO2 arterial: 26.9 mmHg — ABNORMAL LOW (ref 35.0–45.0)
pO2, Arterial: 185 mmHg — ABNORMAL HIGH (ref 80.0–100.0)

## 2014-07-02 LAB — URINE CULTURE

## 2014-07-02 LAB — CLOSTRIDIUM DIFFICILE BY PCR: CDIFFPCR: NEGATIVE

## 2014-07-02 LAB — GLUCOSE, CAPILLARY: Glucose-Capillary: 115 mg/dL — ABNORMAL HIGH (ref 70–99)

## 2014-07-02 LAB — CORTISOL: Cortisol, Plasma: 45.7 ug/dL

## 2014-07-02 MED ORDER — PHENYLEPHRINE HCL 10 MG/ML IJ SOLN
30.0000 ug/min | INTRAVENOUS | Status: DC
  Filled 2014-07-02: qty 4

## 2014-07-02 MED ORDER — PHENYLEPHRINE HCL 10 MG/ML IJ SOLN
30.0000 ug/min | INTRAVENOUS | Status: DC
  Administered 2014-07-02: 30 ug/min via INTRAVENOUS
  Administered 2014-07-02 (×2): 200 ug/min via INTRAVENOUS
  Filled 2014-07-02 (×3): qty 2

## 2014-07-02 MED ORDER — SODIUM CHLORIDE 0.9 % IV SOLN
80.0000 mg | Freq: Once | INTRAVENOUS | Status: AC
  Administered 2014-07-02: 80 mg via INTRAVENOUS
  Filled 2014-07-02: qty 80

## 2014-07-02 MED ORDER — SODIUM BICARBONATE 8.4 % IV SOLN
100.0000 meq | Freq: Once | INTRAVENOUS | Status: AC
  Administered 2014-07-02: 100 meq via INTRAVENOUS

## 2014-07-02 MED ORDER — LORAZEPAM 2 MG/ML IJ SOLN
INTRAMUSCULAR | Status: AC
  Administered 2014-07-02: 4 mg
  Filled 2014-07-02: qty 2

## 2014-07-02 MED ORDER — SODIUM BICARBONATE 8.4 % IV SOLN
INTRAVENOUS | Status: AC
  Filled 2014-07-02: qty 50

## 2014-07-02 MED ORDER — SODIUM BICARBONATE 8.4 % IV SOLN
INTRAVENOUS | Status: AC
  Filled 2014-07-02: qty 100

## 2014-07-02 MED ORDER — LORAZEPAM 2 MG/ML IJ SOLN: 2.0000 mg | INTRAMUSCULAR | Status: DC | PRN

## 2014-07-02 MED ORDER — SODIUM BICARBONATE 8.4 % IV SOLN
100.0000 meq | Freq: Once | INTRAVENOUS | Status: AC
  Administered 2014-07-02: 100 meq via INTRAVENOUS
  Filled 2014-07-02: qty 100

## 2014-07-02 MED ORDER — SODIUM BICARBONATE 8.4 % IV SOLN
INTRAVENOUS | Status: DC
  Administered 2014-07-02: 01:00:00 via INTRAVENOUS
  Filled 2014-07-02 (×3): qty 150

## 2014-07-02 MED ORDER — PANTOPRAZOLE SODIUM 40 MG IV SOLR
8.0000 mg/h | INTRAVENOUS | Status: DC
  Administered 2014-07-02: 8 mg/h via INTRAVENOUS
  Filled 2014-07-02 (×3): qty 80

## 2014-07-02 MED ORDER — SUCRALFATE 1 GM/10ML PO SUSP
1.0000 g | Freq: Four times a day (QID) | ORAL | Status: DC
  Filled 2014-07-02 (×7): qty 10

## 2014-07-02 MED FILL — Medication: Qty: 1 | Status: AC

## 2014-07-03 ENCOUNTER — Inpatient Hospital Stay (HOSPITAL_COMMUNITY): Payer: Medicare Other | Admitting: Physical Therapy

## 2014-07-04 ENCOUNTER — Encounter (HOSPITAL_COMMUNITY): Payer: Self-pay | Admitting: Gastroenterology

## 2014-07-04 LAB — TYPE AND SCREEN
ABO/RH(D): A POS
Antibody Screen: NEGATIVE
UNIT DIVISION: 0
UNIT DIVISION: 0
UNIT DIVISION: 0
Unit division: 0
Unit division: 0
Unit division: 0
Unit division: 0
Unit division: 0
Unit division: 0
Unit division: 0

## 2014-07-04 LAB — CULTURE, BLOOD (ROUTINE X 2)

## 2014-07-04 LAB — GLUCOSE, CAPILLARY: GLUCOSE-CAPILLARY: 98 mg/dL (ref 70–99)

## 2014-07-08 LAB — CULTURE, BLOOD (ROUTINE X 2): CULTURE: NO GROWTH

## 2014-07-14 NOTE — Discharge Summary (Signed)
Staff note  - supervised the discharge process. See NP notes for details  Dr. Brand Males, M.D., [facility].C.P Pulmonary and Critical Care Medicine Staff Physician [facility] Pager: [phone], If no answer or between  15:00h - 7:00h: call [phone]  07/14/2014 5:20 AM

## 2014-07-17 NOTE — Discharge Summary (Signed)
Patient seen and examined, agree with above note.  I dictated the care and orders written for this patient under my direction.  Juleon Narang G Elease Swarm, MD [phone] 

## 2014-07-17 NOTE — Progress Notes (Signed)
CRITICAL VALUE ALERT  Critical value received:  Dr. Jimmy Footman notified of Abg results  Date of notification:  2014-07-30  Time of notification:  0015  Critical value read back:Yes.    Nurse who received alert:  Dillard Essex  MD notified (1st page):  Dr. Jimmy Footman  Time of first page:  0015  MD notified (2nd page):  Time of second page:  Responding MD:  Dr. Jimmy Footman  Time MD responded:  [phone]

## 2014-07-17 NOTE — Progress Notes (Signed)
Asystole noted per monitor at 0754. Family at bedside. Pronounced by myself and Mindy Hopper,RN. No spontaneous respirations, not heart sounds x 1 minute. Ronnie Doss, NP notified of time of death.[place] notified. Family support given. Family have a pastor at bedside. 130 cc of fentanyl wasted in the sink by myself and Investment banker, operational.Marland Kitchen

## 2014-07-17 NOTE — Progress Notes (Signed)
Family wishes to withdraw care when granddaughter arrives from out of town.  Pt on fentanyl gtt and appears comfortable.  Will titrate to comfort and hold meds/labs at this time.  Dr. Jimmy Footman aware.  Will monitor pt.

## 2014-07-17 NOTE — Progress Notes (Signed)
CRITICAL VALUE ALERT  Critical value received:  hgb-6  Date of notification:  06/17/2014  Time of notification:  1900  Critical value read back:Yes.    Nurse who received alert:  Dillard Essex  MD notified (1st page):  Dr. Lowry Ram  Time of first page:  1905  MD notified (2nd page):  Time of second page:  Responding MD:  Dr. Lowry Ram  Time MD responded:  [phone]

## 2014-07-17 NOTE — Progress Notes (Signed)
Family at bedside with pt.  Family updated regarding pts declining condition.  Questions answered.  Support given.  Pt made NCB and comfort measures continued.  Will monitor pt.

## 2014-07-17 NOTE — Discharge Summary (Signed)
NAMEJEFFRIE, LOFSTROM NO.:  [id number]  MEDICAL RECORD NO.:  [id number]  LOCATION:  4W06C                        FACILITY:  [facility]  PHYSICIAN:  Meredith Staggers, M.D.DATE OF BIRTH:  Sep 10, 1941  DATE OF ADMISSION:  06/21/2014 DATE OF DISCHARGE:  06/24/2014                              DISCHARGE SUMMARY   DISCHARGE DIAGNOSES: 1. 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 2. Probable gastrointestinal bleed. 3. Atrial fibrillation. 4. Diabetes mellitus. 5. Tobacco abuse with chronic obstructive pulmonary disease. 6. Hyperlipidemia.  HISTORY OF PRESENT ILLNESS:  This is a 73 year old right-handed male with history of COPD, atrial fibrillation, on Eliquis and remote seizure disorder.  The patient on no antiseizure medication prior to admission. He lives with his wife and used a rolling walker prior to admission. Admitted on June 11, 2014, after fall at home with standing from a sitting position and struck his head.  There was reports of brief unresponsiveness.  He was initially conversant.  In the ED, had a seizure, requiring intubation for airway protection.  Noted calcium level 6.1, magnesium 0.6 on admission.  Cranial CT scan negative for acute changes.  MRI negative.  CT angiogram of the chest negative. Initially loaded with Keppra, later discontinued secondary to agitation, felt this was possibly contributing to his restlessness.  No further seizure activity noted.  Suspect hypomagnesium as well as hypocalcemia possibly related to his seizures.  EEG with moderate diffuse swelling. No seizure activity noted.  Generalized cerebral dysfunction, suspect encephalopathy.  His Eliquis was resumed for history of atrial fibrillation.  Cardiac markers negative.  The patient was extubated on June 17, 2014.  He was maintained on a dysphagia I pudding thick liquid.  He was admitted for  comprehensive rehab program.  PAST MEDICAL HISTORY:  See discharge diagnoses.  SOCIAL HISTORY:  Lives with spouse.  FUNCTIONAL HISTORY:  Prior to admission, needed assistance per wife with socks, his pants.  Min-to-mod assist for activities of daily living.  PHYSICAL EXAMINATION:  VITAL SIGNS:  Blood pressure 170/84, pulse 81, temperature 98, respirations 22. GENERAL:  This was an alert male, sitting at side of the bed, made good eye contact with examiner.  He could state his name, age, date of birth. Followed simple commands.  He did have some delay in higher cognitive functioning and limited medical historian. LUNGS:  Decreased breath sounds. CARDIAC:  Rate controlled. ABDOMEN:  Soft, nontender.  Good bowel sounds.  REHABILITATION HOSPITAL COURSE:  The patient was admitted to Inpatient Rehab Services with therapies initiated on a 3-hour daily basis consisting of physical therapy, occupational therapy, speech therapy, and rehabilitation nursing.  The following issues were addressed during the patient's rehabilitation stay.  Pertaining to Mr. Nigh debilitation after suspect seizure related to low magnesium, calcium levels remained stable.  No further seizure activity noted.  EEG showed suspect encephalopathy.  He was on no current antiseizure medication. He had been resumed on his Eliquis for atrial fibrillation, rate controlled; however, decrease in hemoglobin with multiple bloody stools. Gastroenterology consulted, advised to monitor.  Recommendations to discontinue to Eliquis, possible need for flexible sigmoidoscopy to evaluate for probable GI bleed.  He remained  on Protonix intravenously. His diet was slowly advanced as per Speech Therapy and monitor.  He did have a history of tobacco abuse, COPD, monitoring of oxygen saturations as advised.  The patient with noted ongoing anemia decreasing to 8.1, hypotensive, tachycardic.  It was felt with these medical changes,  the patient should be discharged to the acute care side for ongoing medical management.  Thus, bed was obtained with [facility] for further ongoing management.  All issues were discussed with the patient and his wife.  Chest x-ray on June 30, 2014, completed, showing no edema or consolidation noted.  All medication changes to be as per [facility].     Lauraine Rinne, P.A.   ______________________________ Meredith Staggers, M.D.    DA/MEDQ  D:  06/18/2014  T:  07/31/14  Job:  [id number]

## 2014-07-17 NOTE — Progress Notes (Signed)
eLink Physician-Brief Progress Note Patient Name: Chad Morse DOB: December 22, 1940 MRN: [id number]   Date of Service  07-18-2014  HPI/Events of Note  Patient with progressive decline in status on max pressor support and bicarb gtt.  Continues to have ABP in the 48J systolic.  Family at bedside requesting that no further interventions be attempted.  eICU Interventions  Plan: Patient made DNR with no further escalation of care.     Intervention Category Major Interventions: End of life / care limitation discussion  [place] 18-Jul-2014, 3:53 AM

## 2014-07-17 NOTE — Procedures (Signed)
Extubation Procedure Note  Patient Details:   Name: JON LALL DOB: 08/14/1941 MRN: [id number]   Airway Documentation:  Airway 7.5 mm (Active)  Secured at (cm) 24 cm 07/06/2014  3:42 AM  Measured From Lips 2014-07-06  3:42 AM  Secured Location Left 07-06-2014  3:42 AM  Secured By [employer] 07-06-2014  3:42 AM  Tube Holder Repositioned Yes 07/06/2014  3:42 AM  Cuff Pressure (cm H2O) 30 cm H2O Jul 06, 2014  3:42 AM  Site Condition Cool;Dry Jul 06, 2014  3:42 AM    Evaluation  O2 sats: currently acceptable Complications: No apparent complications Patient did not tolerate procedure well. Bilateral Breath Sounds: Diminished   No  Pt terminally extubated per MD order.  RN at bedside as well as family.  RT will continue to monitor.  [place], Deetta Perla 07/06/2014, 7:45 AM

## 2014-07-17 NOTE — Progress Notes (Signed)
Orders from Dr. Alva Garnet to withdrawal life support. Patient was given 4 mg ativan IV prior to extubation. All family are here and surrounding the bed. The fentanyl gtt remains on, patient appears comfortable. All drips turned off with the exception of the fentanyl gtt. Family at bedside. The patient is a full DNR per the families wishes.

## 2014-07-17 DEATH — deceased

## 2014-07-18 ENCOUNTER — Inpatient Hospital Stay: Payer: Medicare Other | Admitting: Critical Care Medicine

## 2014-08-12 NOTE — Discharge Summary (Addendum)
Chad Morse, BAHL NO.:  [id number]  MEDICAL RECORD NO.:  [id number]  LOCATION:  2M07C                        FACILITY:  [facility]  PHYSICIAN:  Providence Lanius, MD  DATE OF BIRTH:  [birth year]  DATE OF ADMISSION:  06/19/2014 DATE OF DISCHARGE:  2014/07/20                              DISCHARGE SUMMARY   DEATH SUMMARY  PRIMARY DIAGNOSIS/CAUSE OF DEATH:  Hemorrhagic shock.  SECONDARY DIAGNOSES:  Gastrointestinal bleeding related to diverticulosis, cardiac arrest, respiratory failure, chronic obstructive pulmonary disease, chronic anticoagulation.  The patient is a 73 year old male with history of AFib with chronic anticoagulation, who was admitted for an upper GI bleed on the 16th, was taken to endoscopy where was admitted to the intensive care unit due to hypertension from hemorrhagic shock, was taken to the endoscopy lab by Dr. Cristina Gong for EGD.  The patient was brought back to the intensive care unit; however, as the day progressed, his shock became refractory.  He was suffered from respiratory failure, had a cardiac arrest.  ACLS protocol was followed by Dr. Kara Mead.  The patient remained hypotensive in the 50s; however, post-return of spontaneous circulation, please see code sheet for details.  Subsequently, the family recognized that the patient was not doing particularly well.  Requested to speak to our [place] physician who after discussion by Dr. Jimmy Footman at 3:54 a.m. on 2014/07/20, agreed that the patient should be comfort care at this point, and the patient was made comfort care.  Morphine was started and active care was withdrawal for the patient to expire shortly thereafter with the family at bedside.  Providence Lanius, MD  WJY/MEDQ  D:  08/12/2014  T:  08/12/2014  Job:  [id number]

## 2014-08-12 NOTE — Discharge Summary (Signed)
NAMEDARRIL, PATRIARCA NO.:  [id number]  MEDICAL RECORD NO.:  [id number]  LOCATION:  2M07C                        FACILITY:  [facility]  PHYSICIAN:  Providence Lanius, MD  DATE OF BIRTH:  [birth year]  DATE OF ADMISSION:  06/17/2014 DATE OF DISCHARGE:  2014-07-14                              DISCHARGE SUMMARY   DEATH SUMMARY  PRIMARY DIAGNOSIS/CAUSE OF DEATH:  Refractory shock.  SECONDARY DIAGNOSES: Dictation ended at this point.     Providence Lanius, MD     WJY/MEDQ  D:  08/12/2014  T:  08/12/2014  Job:  [id number]

## 2014-08-19 ENCOUNTER — Ambulatory Visit: Payer: Medicare Other | Admitting: Cardiovascular Disease

## 2014-10-06 ENCOUNTER — Ambulatory Visit: Payer: Medicare Other | Admitting: Neurology

## 2016-01-07 IMAGING — CR DG CHEST 1V PORT
1 series · 1 of 1 positions shown · non-contrast
Comparison: 06/12/2014

CLINICAL DATA: Check ETT position.

EXAM:
PORTABLE CHEST - 1 VIEW

[AP]
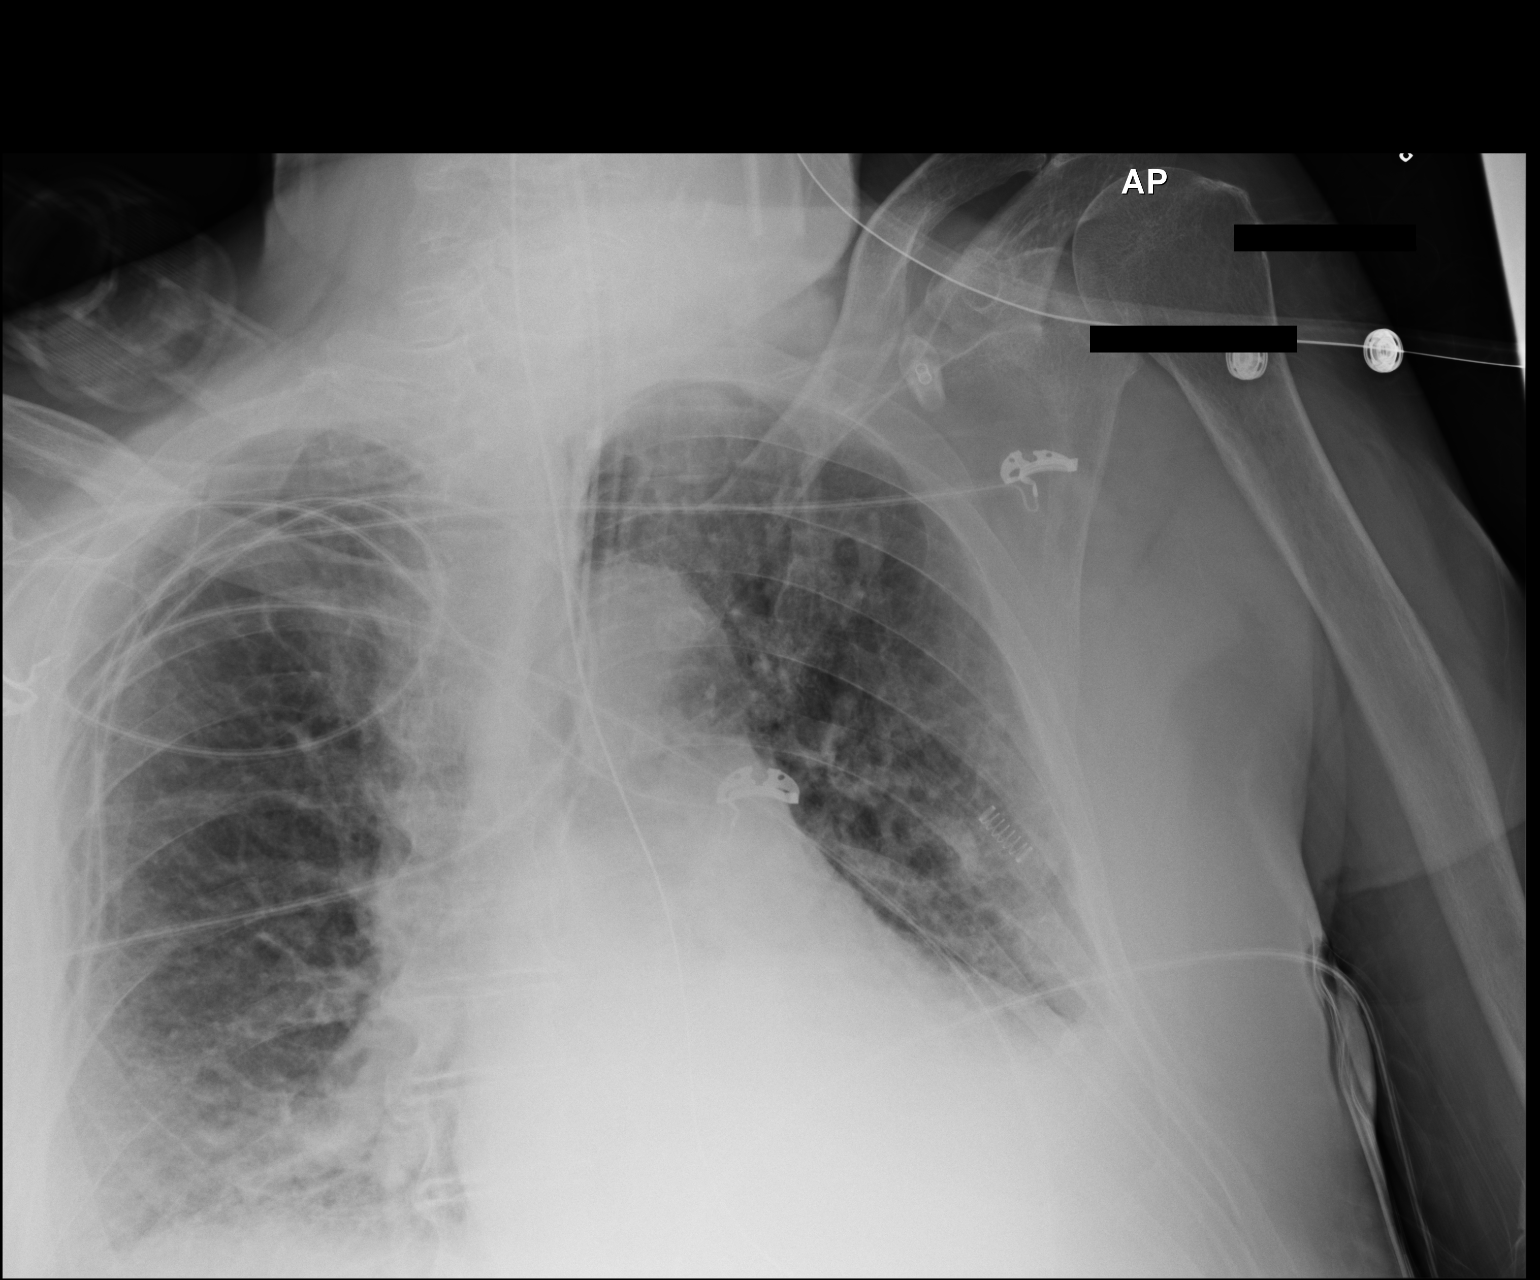

[1 of 1 positions shown; findings below may reference images not displayed]

FINDINGS: ET tube and NG tube are in stable position. Continued left lower
lobe atelectasis or infiltrate with small left effusion. Increasing
right basilar atelectasis or infiltrate. Heart is upper limits
normal in size.
IMPRESSION: Stable left basilar atelectasis or infiltrate and small effusion.

Increasing right basilar atelectasis or infiltrate

## 2016-01-08 IMAGING — CR DG CHEST 1V PORT
1 series · 1 of 1 positions shown · non-contrast
Comparison: 5256 hr today and earlier.

CLINICAL DATA: 73-year-old male with new line Placement. Initial
encounter.

EXAM:
PORTABLE CHEST - 1 VIEW

[AP]
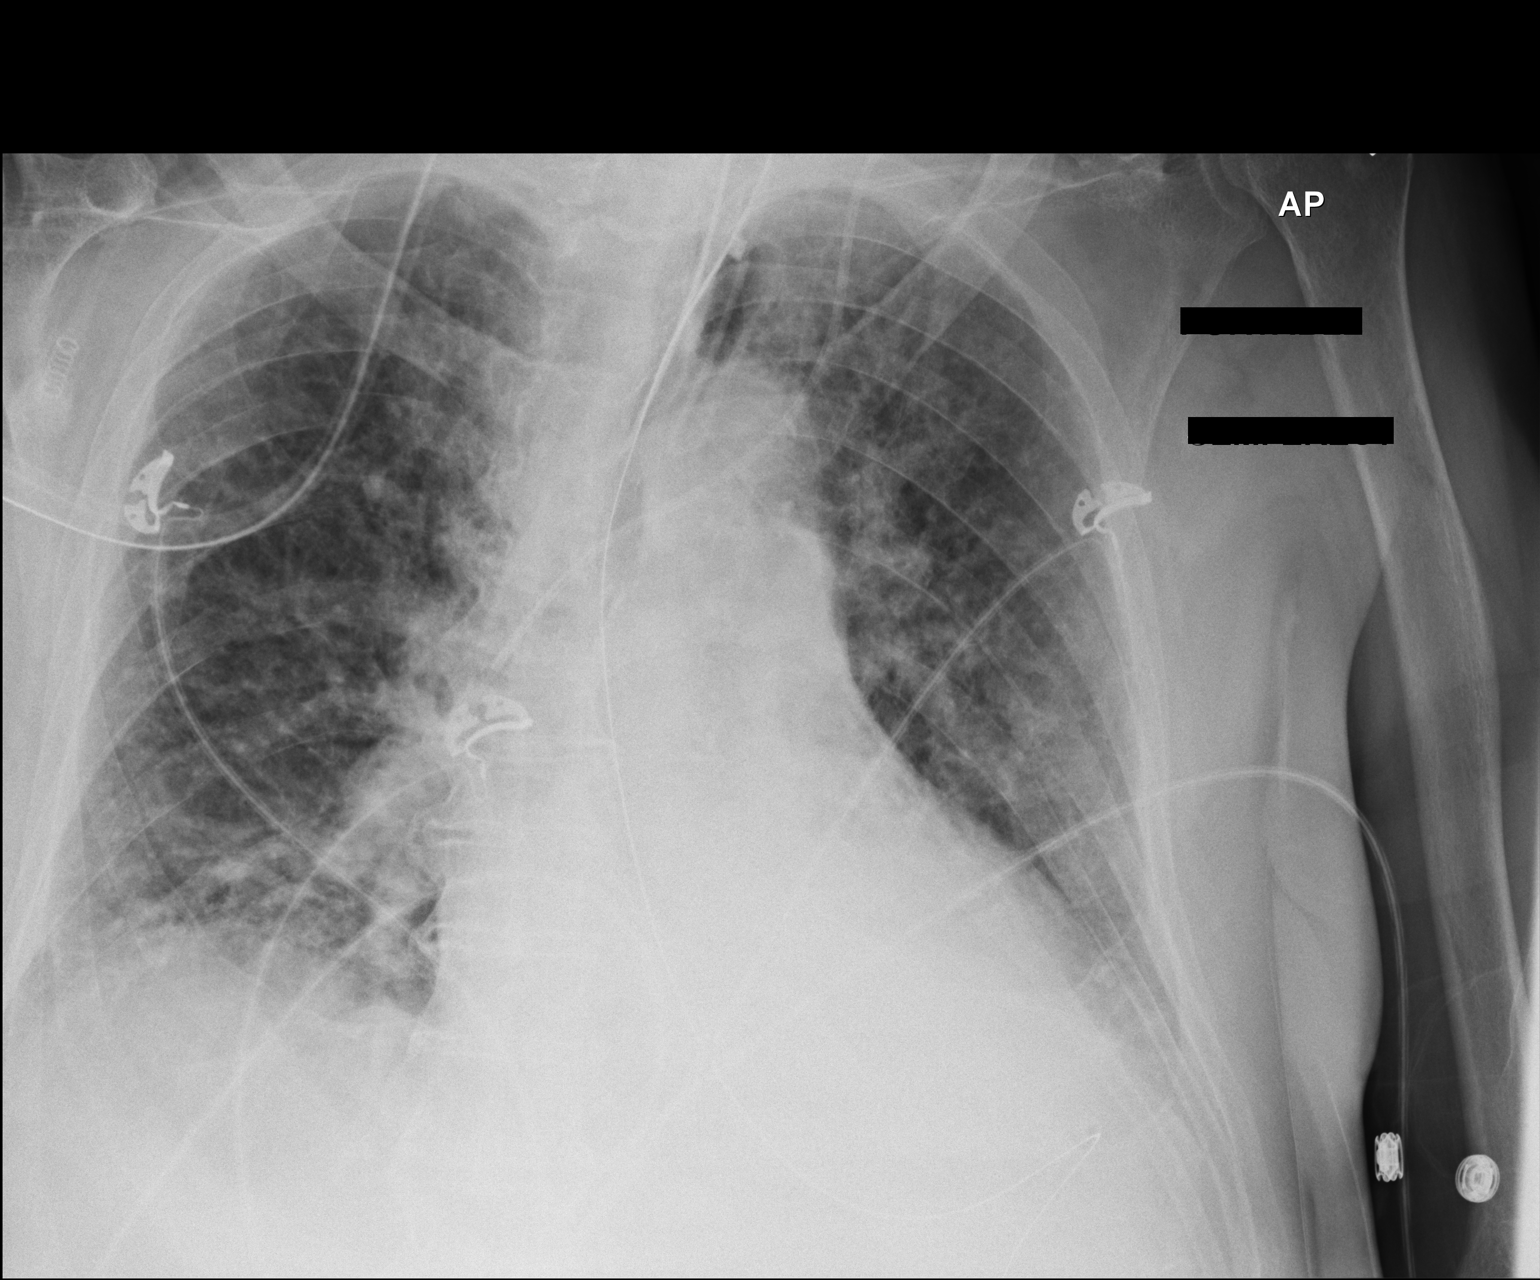

[1 of 1 positions shown; findings below may reference images not displayed]

FINDINGS: Portable AP semi upright view at 4247 hrs. Stable endotracheal tube
tip at the level the clavicles. New left IJ approach central line,
tip projects just below the clavicle. No pneumothorax. Stable
cardiac size and mediastinal contours. Enteric tube in place,
courses to the left upper quadrant and tip not included. Stable
ventilation.
IMPRESSION: 1. Left IJ central line placed, tip at the lower SVC level. No
pneumothorax and stable ventilation.
2. Enteric tube courses to the left upper quadrant, tip not
included.
3. Stable endotracheal tube.

## 2016-01-08 IMAGING — CR DG ABD PORTABLE 1V
1 series · 1 of 1 positions shown · non-contrast
Comparison: CT 06/10/2014

CLINICAL DATA: OG tube position.  Abdominal distention.

EXAM:
PORTABLE ABDOMEN - 1 VIEW

[AP]
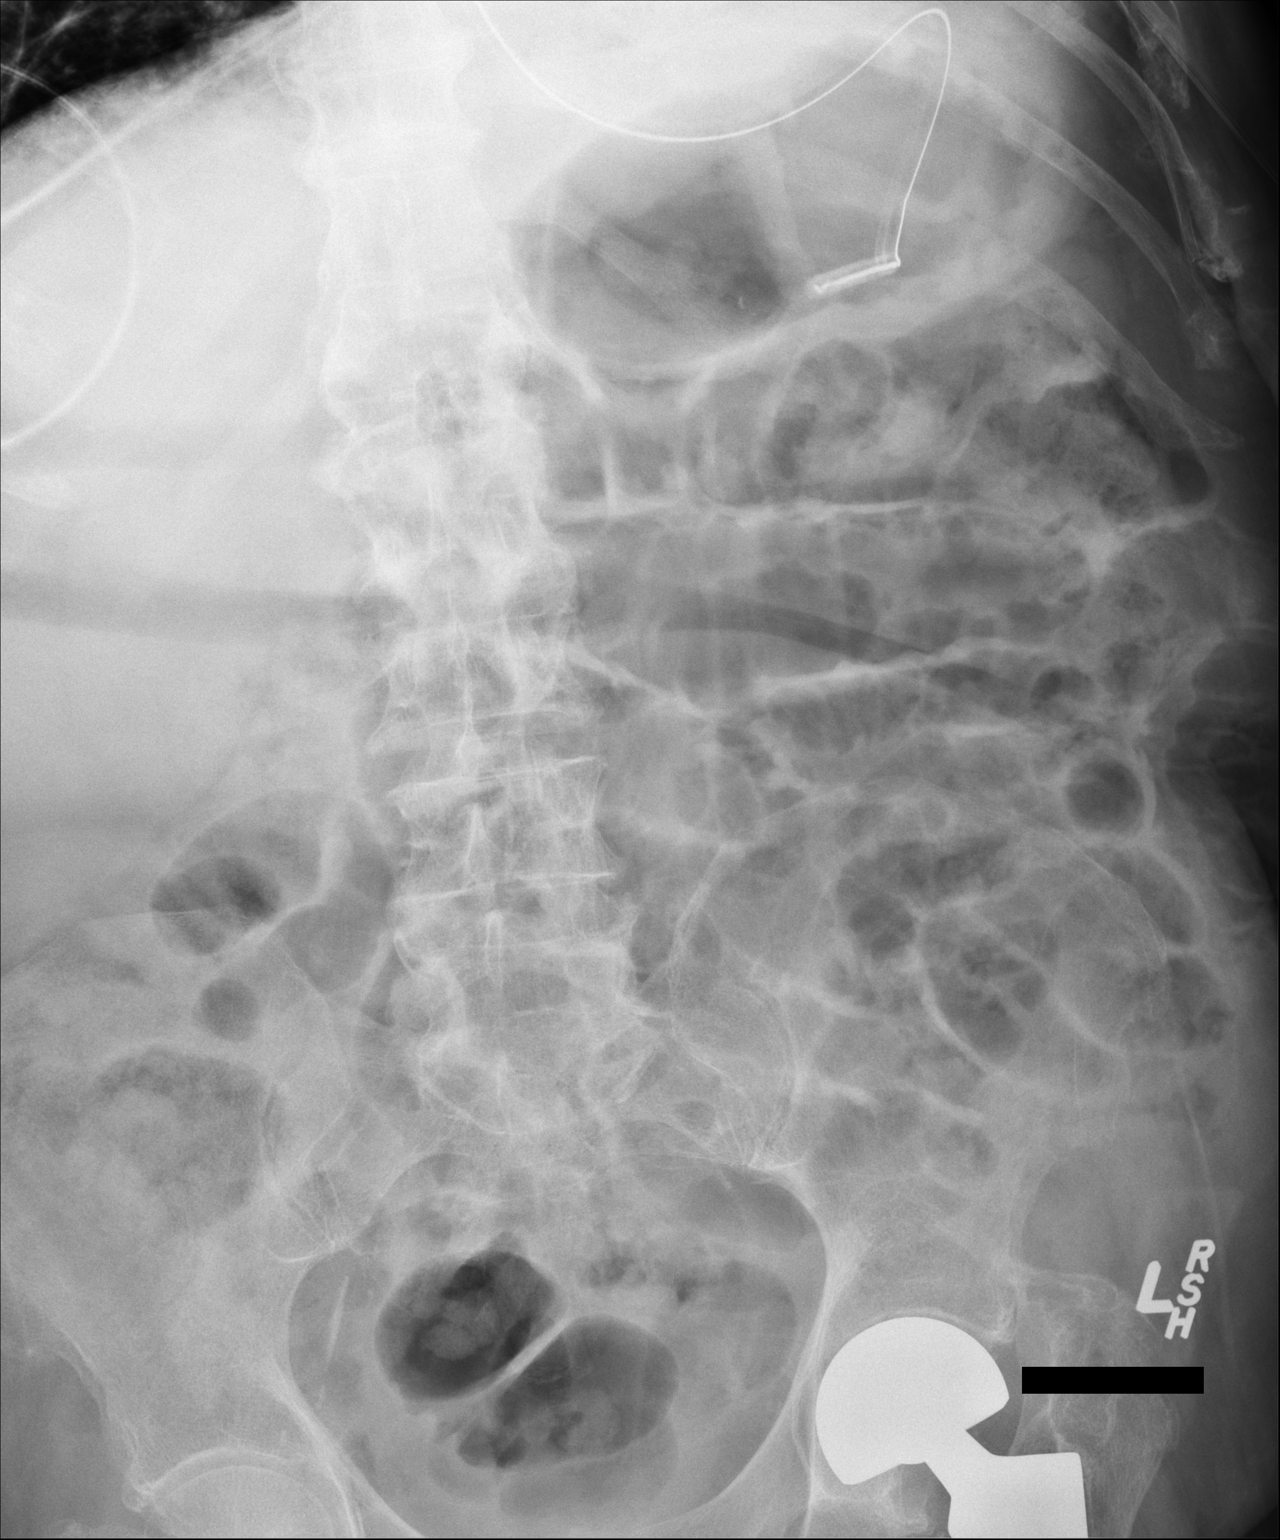

[1 of 1 positions shown; findings below may reference images not displayed]

FINDINGS: Enteric tube tip is in the mid stomach. Mild diffuse gaseous
distention of bowel without evidence for obstruction. No supine
evidence of free air.
IMPRESSION: Enteric tube tip in the mid stomach.

## 2016-01-09 IMAGING — CR DG ABD PORTABLE 1V
1 series · 1 of 1 positions shown · non-contrast
Comparison: 06/14/2014

CLINICAL DATA: NG tube placement.

EXAM:
PORTABLE ABDOMEN - 1 VIEW

[AP]
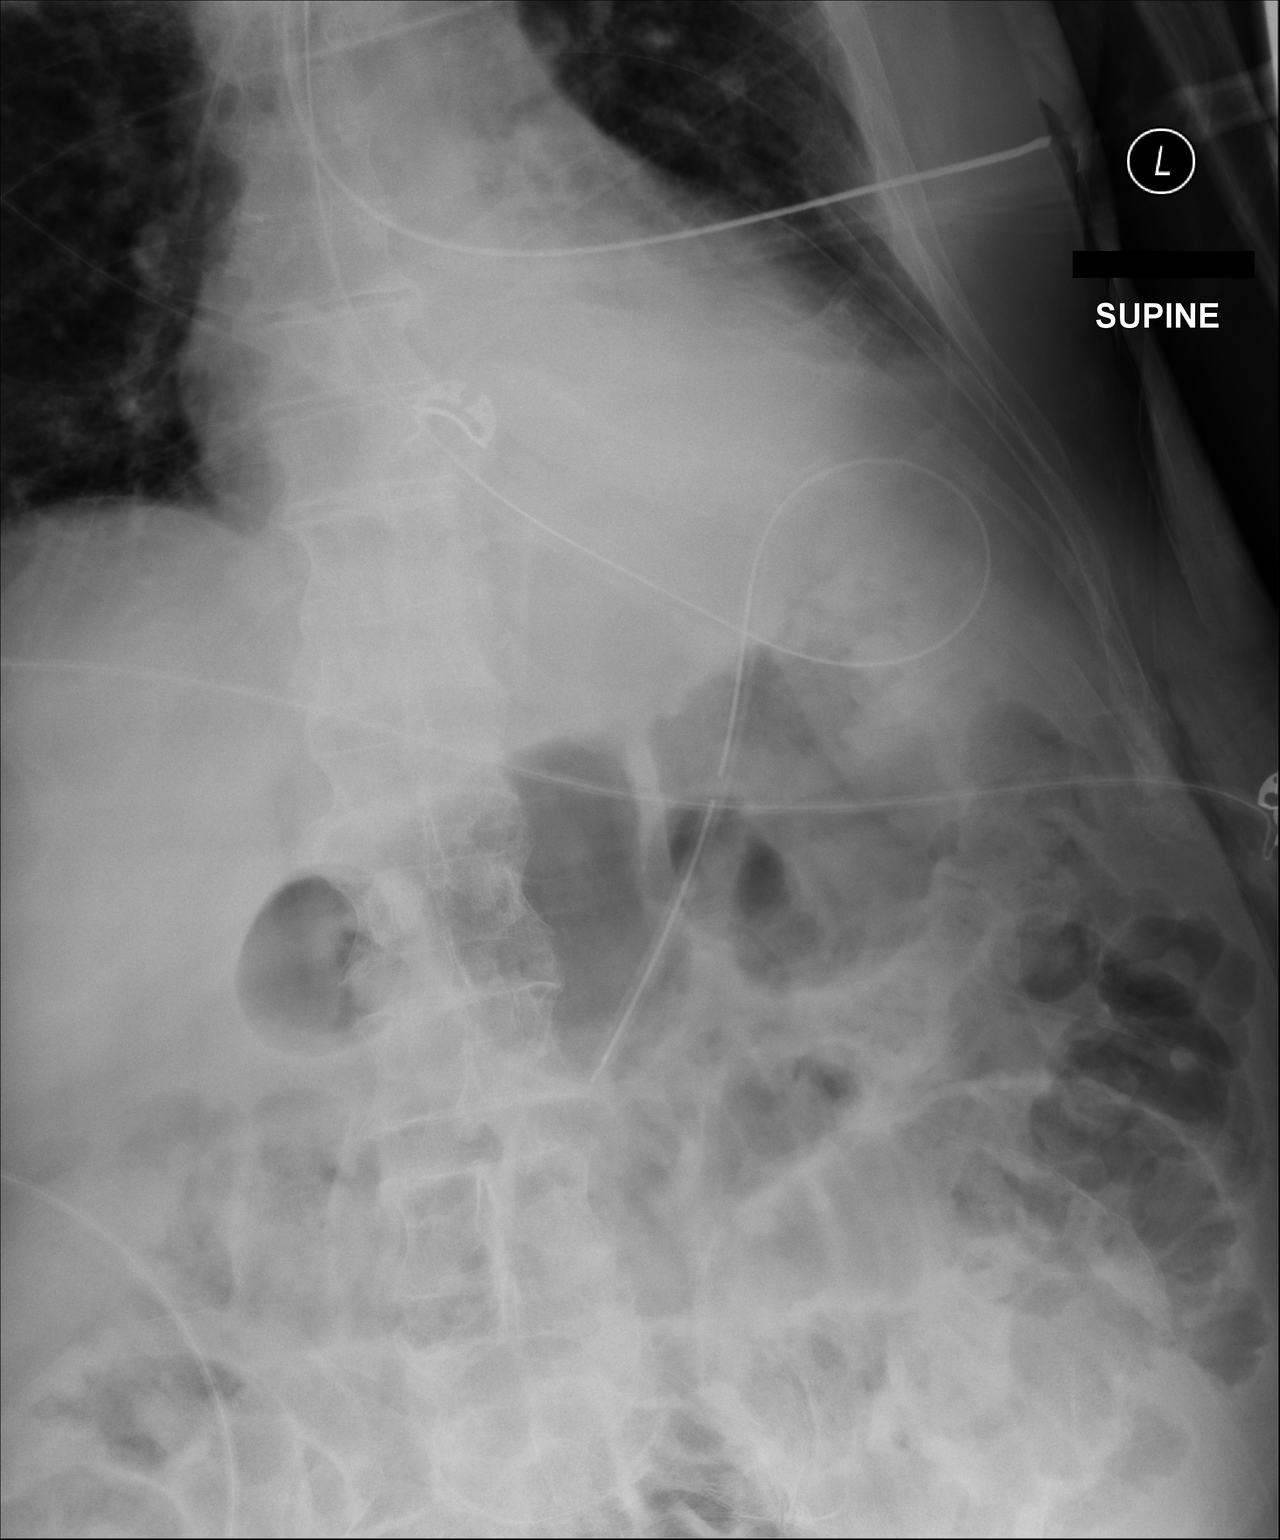

[1 of 1 positions shown; findings below may reference images not displayed]

FINDINGS: The NG tube tip is in the body/ antral region of the stomach. The
bowel gas pattern is stable.
IMPRESSION: The NG tube tip is in the body/ antral region of the stomach.

## 2016-01-09 IMAGING — CR DG CHEST 1V PORT
1 series · 1 of 1 positions shown · non-contrast
Comparison: 06/14/2014.

CLINICAL DATA: Pneumonitis.  Shortness of breath.

EXAM:
PORTABLE CHEST - 1 VIEW

[AP]
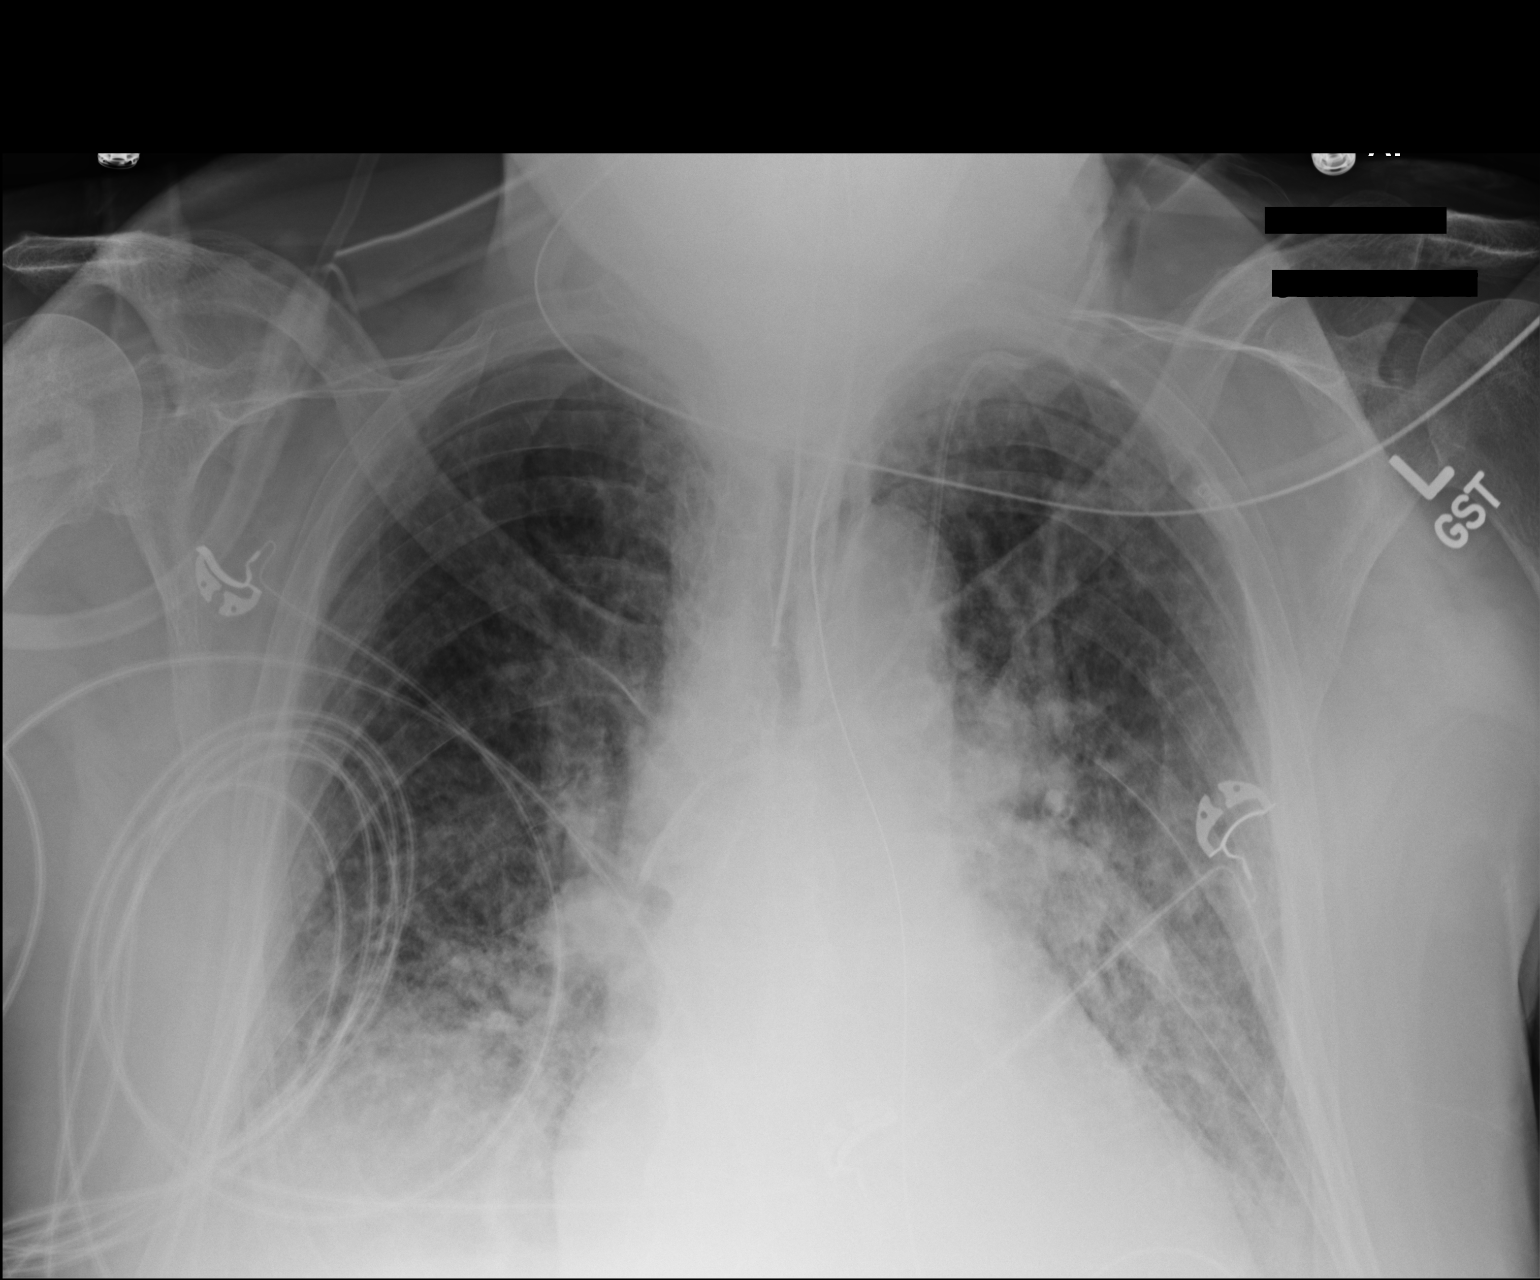

[1 of 1 positions shown; findings below may reference images not displayed]

FINDINGS: Tracheostomy tube, NG tube, left IJ line in stable position.
Cardiomegaly with mild pulmonary venous congestion. Diffuse
pulmonary alveolar infiltrates are present. No pleural effusion or
pneumothorax. No acute bony abnormality.
IMPRESSION: 1. Line and tubes in stable position.
2. Cardiomegaly with pulmonary venous congestion.
3. Bilateral pulmonary infiltrates. Bilateral pulmonary edema and/or
pneumonia could present in this fashion.
